# Patient Record
Sex: Male | Born: 1945 | Race: White | Hispanic: No | Marital: Married | Smoking: Former smoker
Health system: Southern US, Community
[De-identification: ages and names within clinical notes are randomized; demographics above are authoritative.]

## PROBLEM LIST (undated history)

## (undated) DIAGNOSIS — F4310 Post-traumatic stress disorder, unspecified: Secondary | ICD-10-CM

## (undated) DIAGNOSIS — M533 Sacrococcygeal disorders, not elsewhere classified: Secondary | ICD-10-CM

## (undated) DIAGNOSIS — M545 Low back pain, unspecified: Secondary | ICD-10-CM

## (undated) DIAGNOSIS — E11319 Type 2 diabetes mellitus with unspecified diabetic retinopathy without macular edema: Secondary | ICD-10-CM

## (undated) DIAGNOSIS — M5431 Sciatica, right side: Secondary | ICD-10-CM

## (undated) DIAGNOSIS — C6290 Malignant neoplasm of unspecified testis, unspecified whether descended or undescended: Secondary | ICD-10-CM

## (undated) DIAGNOSIS — E785 Hyperlipidemia, unspecified: Secondary | ICD-10-CM

## (undated) DIAGNOSIS — D649 Anemia, unspecified: Secondary | ICD-10-CM

## (undated) DIAGNOSIS — I219 Acute myocardial infarction, unspecified: Secondary | ICD-10-CM

## (undated) DIAGNOSIS — H409 Unspecified glaucoma: Secondary | ICD-10-CM

## (undated) DIAGNOSIS — G8929 Other chronic pain: Secondary | ICD-10-CM

## (undated) DIAGNOSIS — G43909 Migraine, unspecified, not intractable, without status migrainosus: Secondary | ICD-10-CM

## (undated) DIAGNOSIS — I2510 Atherosclerotic heart disease of native coronary artery without angina pectoris: Secondary | ICD-10-CM

## (undated) DIAGNOSIS — I4892 Unspecified atrial flutter: Secondary | ICD-10-CM

## (undated) DIAGNOSIS — H269 Unspecified cataract: Secondary | ICD-10-CM

## (undated) DIAGNOSIS — J189 Pneumonia, unspecified organism: Secondary | ICD-10-CM

## (undated) DIAGNOSIS — A6920 Lyme disease, unspecified: Secondary | ICD-10-CM

## (undated) DIAGNOSIS — I050 Rheumatic mitral stenosis: Secondary | ICD-10-CM

## (undated) DIAGNOSIS — I509 Heart failure, unspecified: Secondary | ICD-10-CM

## (undated) DIAGNOSIS — I38 Endocarditis, valve unspecified: Secondary | ICD-10-CM

## (undated) DIAGNOSIS — E119 Type 2 diabetes mellitus without complications: Secondary | ICD-10-CM

## (undated) DIAGNOSIS — M1990 Unspecified osteoarthritis, unspecified site: Secondary | ICD-10-CM

## (undated) HISTORY — DX: Unspecified cataract: H26.9

## (undated) HISTORY — PX: FRACTURE SURGERY: SHX138

## (undated) HISTORY — DX: Type 2 diabetes mellitus with unspecified diabetic retinopathy without macular edema: E11.319

## (undated) HISTORY — DX: Hyperlipidemia, unspecified: E78.5

## (undated) HISTORY — DX: Atherosclerotic heart disease of native coronary artery without angina pectoris: I25.10

## (undated) HISTORY — DX: Rheumatic mitral stenosis: I05.0

## (undated) HISTORY — PX: CARDIAC VALVE REPLACEMENT: SHX585

## (undated) HISTORY — DX: Malignant neoplasm of unspecified testis, unspecified whether descended or undescended: C62.90

## (undated) HISTORY — PX: ELBOW FRACTURE SURGERY: SHX616

## (undated) HISTORY — PX: TONSILLECTOMY: SUR1361

## (undated) HISTORY — PX: ABDOMINAL EXPLORATION SURGERY: SHX538

## (undated) HISTORY — DX: Endocarditis, valve unspecified: I38

## (undated) HISTORY — DX: Unspecified atrial flutter: I48.92

## (undated) HISTORY — PX: EYE SURGERY: SHX253

## (undated) HISTORY — DX: Unspecified glaucoma: H40.9

---
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
## 1980-04-27 DIAGNOSIS — C6290 Malignant neoplasm of unspecified testis, unspecified whether descended or undescended: Secondary | ICD-10-CM

## 1980-04-27 HISTORY — PX: TESTICLE REMOVAL: SHX68

## 1980-04-27 HISTORY — DX: Malignant neoplasm of unspecified testis, unspecified whether descended or undescended: C62.90

## 2006-06-20 ENCOUNTER — Emergency Department (HOSPITAL_COMMUNITY): Admission: EM | Admit: 2006-06-20 | Discharge: 2006-06-20 | Payer: Self-pay | Admitting: Emergency Medicine

## 2006-06-23 ENCOUNTER — Observation Stay (HOSPITAL_COMMUNITY): Admission: RE | Admit: 2006-06-23 | Discharge: 2006-06-23 | Payer: Self-pay | Admitting: Orthopedic Surgery

## 2006-08-12 ENCOUNTER — Emergency Department (HOSPITAL_COMMUNITY): Admission: EM | Admit: 2006-08-12 | Discharge: 2006-08-12 | Payer: Self-pay | Admitting: Emergency Medicine

## 2008-06-12 ENCOUNTER — Ambulatory Visit (HOSPITAL_COMMUNITY): Admission: RE | Admit: 2008-06-12 | Discharge: 2008-06-12 | Payer: Self-pay | Admitting: General Surgery

## 2009-04-27 HISTORY — PX: CARDIAC CATHETERIZATION: SHX172

## 2009-05-28 HISTORY — PX: CORONARY ARTERY BYPASS GRAFT: SHX141

## 2009-06-11 ENCOUNTER — Ambulatory Visit: Payer: Self-pay | Admitting: Cardiovascular Disease

## 2009-06-11 DIAGNOSIS — I209 Angina pectoris, unspecified: Secondary | ICD-10-CM | POA: Insufficient documentation

## 2009-06-14 ENCOUNTER — Ambulatory Visit (HOSPITAL_COMMUNITY): Admission: RE | Admit: 2009-06-14 | Discharge: 2009-06-14 | Payer: Self-pay | Admitting: Cardiology

## 2009-06-18 ENCOUNTER — Ambulatory Visit: Payer: Self-pay | Admitting: Surgery

## 2009-06-18 ENCOUNTER — Encounter: Payer: Self-pay | Admitting: Surgery

## 2009-06-18 ENCOUNTER — Ambulatory Visit (HOSPITAL_COMMUNITY): Admission: RE | Admit: 2009-06-18 | Discharge: 2009-06-18 | Payer: Self-pay | Admitting: Surgery

## 2009-06-20 ENCOUNTER — Inpatient Hospital Stay (HOSPITAL_COMMUNITY): Admission: RE | Admit: 2009-06-20 | Discharge: 2009-06-25 | Payer: Self-pay | Admitting: Surgery

## 2009-06-20 ENCOUNTER — Ambulatory Visit: Payer: Self-pay | Admitting: Surgery

## 2009-07-15 ENCOUNTER — Encounter: Admission: RE | Admit: 2009-07-15 | Discharge: 2009-07-15 | Payer: Self-pay | Admitting: Surgery

## 2009-07-15 ENCOUNTER — Ambulatory Visit: Payer: Self-pay | Admitting: Surgery

## 2010-05-29 NOTE — Assessment & Plan Note (Signed)
Summary: np6   Visit Type:  Initial Consult Primary Provider:  Dr Clovis Riley  CC:  2nd. opinion for abn. echo per Dr. Juanda Chance.  History of Present Illness: 65 year-old male presents for initial evaluation of an abnormal echo and EKG. He was seen by  Dr Amil Amen last week and presents today for a second opinion.  He is a Best boy and has noticed over the last several months that he becomes short of breath with exertion. He also has developed 'indigestion' with racquetball on a few occasions, but more recently has not had this problem. He admits to 'slowing down' his pace with exercise. These symptoms occurred early in his course of exertion and they ease up after he plays longer. He also has become short of breath with walking. He describes a different type of pain in the left chest radiating to the back that occurs with lying down and eases up with sitting upright.  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     Current Medications (verified): 1)  Lisinopril 5 Mg Tabs (Lisinopril) .... Take One Tablet By Mouth Daily 2)  Vytorin 10-20 Mg Tabs (Ezetimibe-Simvastatin) .... Take One Tablet By Mouth Daily At Bedtime 3)  Humalog Mix 75/25 75-25 % Susp (Insulin Lispro Prot & Lispro) .... As Directed 4)  Aspirin Ec 325 Mg Tbec (Aspirin) .... Take One Tablet By Mouth Daily  Allergies (verified): 1)  ! Codeine  Past History:  Past medical, surgical, family and social histories (including risk factors) reviewed, and no changes noted (except as noted below).  Past Medical History: Remote history of testicular CA treated with radiation therapy Left elbow surgery 2009 Hernia surgery 2010 Diabetes - type II, insulin-requiring Hypercholesterolemia  Family History: Reviewed history and no changes required. Mother - 68 Father - 72 - MI  Social History: Reviewed history and no changes  required. Retired Married Remote smoker, quit 1982 Social ETOH  Review of Systems       Negative except as per HPI   Vital Signs:  Patient profile:   65 year old male Height:      66 inches Weight:      193 pounds BMI:     31.26 Pulse rate:   82 / minute Pulse rhythm:   regular Resp:     18 per minute BP sitting:   128 / 72  (left arm) Cuff size:   large  Vitals Entered By: Vikki Ports (June 11, 2009 3:20 PM)  Physical Exam  General:  Pt is well-developed, alert and oriented, no acute distress HEENT: normal Neck: no thyromegaly           JVP normal, carotid upstrokes normal without bruits Lungs: CTA Chest: equal expansion  CV: Apical impulse nondisplaced, RRR without murmur or gallop Abd: soft, NT, positive BS, no HSM, no bruit Back: no CVA tenderness Ext: no clubbing, cyanosis, or edema        femoral pulses 2+ without bruits        pedal pulses 2+ and equal Skin: warm, dry, no rash Neuro: CNII-XII intact,strength 5/5 = b/l    Impression & Recommendations:  Problem # 1:  OTHER AND UNSPECIFIED ANGINA PECTORIS (ICD-413.9) The patient has symptoms that are highly suggestive of angina. He clearly has CAD with an old MI on EKG and corresponding wall motion abnormality on echo. In the setting of diabetes and other risk factors, I agree  that he should undergo cardiac catheterization as his initial test for ischemic heart diseaes. His pretest probability is very high, and stress testing won't add much from a diagnostic standpoint. He is scheduled for diagnostic cath by Dr Ty Hilts later this week. We had a long discussion about possible findings and treatment options (PCI vs CABG vs medical therapy), and he understands that these issues will be clarified after his coronary anatomy is delineated at cath. Further care as directed by Dr Ty Hilts.  His updated medication list for this problem includes:    Lisinopril 5 Mg Tabs (Lisinopril) .Marland Kitchen... Take one tablet by mouth  daily    Aspirin Ec 325 Mg Tbec (Aspirin) .Marland Kitchen... Take one tablet by mouth daily  Patient Instructions: 1)  Your physician recommends that you schedule a follow-up appointment as needed.

## 2010-07-17 LAB — CBC
HCT: 26.6 % — ABNORMAL LOW (ref 39.0–52.0)
HCT: 26.8 % — ABNORMAL LOW (ref 39.0–52.0)
HCT: 27.8 % — ABNORMAL LOW (ref 39.0–52.0)
HCT: 30.4 % — ABNORMAL LOW (ref 39.0–52.0)
HCT: 43.3 % (ref 39.0–52.0)
Hemoglobin: 10.5 g/dL — ABNORMAL LOW (ref 13.0–17.0)
Hemoglobin: 14.6 g/dL (ref 13.0–17.0)
Hemoglobin: 9.2 g/dL — ABNORMAL LOW (ref 13.0–17.0)
Hemoglobin: 9.3 g/dL — ABNORMAL LOW (ref 13.0–17.0)
Hemoglobin: 9.5 g/dL — ABNORMAL LOW (ref 13.0–17.0)
MCHC: 33.7 g/dL (ref 30.0–36.0)
MCHC: 34.3 g/dL (ref 30.0–36.0)
MCHC: 34.4 g/dL (ref 30.0–36.0)
MCHC: 34.6 g/dL (ref 30.0–36.0)
MCHC: 34.8 g/dL (ref 30.0–36.0)
MCV: 89 fL (ref 78.0–100.0)
MCV: 89.8 fL (ref 78.0–100.0)
MCV: 89.8 fL (ref 78.0–100.0)
MCV: 90.2 fL (ref 78.0–100.0)
MCV: 90.5 fL (ref 78.0–100.0)
Platelets: 107 10*3/uL — ABNORMAL LOW (ref 150–400)
Platelets: 125 10*3/uL — ABNORMAL LOW (ref 150–400)
Platelets: 127 10*3/uL — ABNORMAL LOW (ref 150–400)
Platelets: 143 10*3/uL — ABNORMAL LOW (ref 150–400)
Platelets: 246 10*3/uL (ref 150–400)
RBC: 2.98 MIL/uL — ABNORMAL LOW (ref 4.22–5.81)
RBC: 2.99 MIL/uL — ABNORMAL LOW (ref 4.22–5.81)
RBC: 3.07 MIL/uL — ABNORMAL LOW (ref 4.22–5.81)
RBC: 3.39 MIL/uL — ABNORMAL LOW (ref 4.22–5.81)
RBC: 4.8 MIL/uL (ref 4.22–5.81)
RDW: 13.6 % (ref 11.5–15.5)
RDW: 13.9 % (ref 11.5–15.5)
RDW: 14.1 % (ref 11.5–15.5)
RDW: 14.4 % (ref 11.5–15.5)
RDW: 14.4 % (ref 11.5–15.5)
WBC: 5.6 10*3/uL (ref 4.0–10.5)
WBC: 5.7 10*3/uL (ref 4.0–10.5)
WBC: 6.8 10*3/uL (ref 4.0–10.5)
WBC: 7.1 10*3/uL (ref 4.0–10.5)
WBC: 8.1 10*3/uL (ref 4.0–10.5)

## 2010-07-17 LAB — BASIC METABOLIC PANEL
BUN: 12 mg/dL (ref 6–23)
BUN: 13 mg/dL (ref 6–23)
BUN: 17 mg/dL (ref 6–23)
BUN: 8 mg/dL (ref 6–23)
CO2: 26 mEq/L (ref 19–32)
CO2: 29 mEq/L (ref 19–32)
CO2: 32 mEq/L (ref 19–32)
CO2: 33 mEq/L — ABNORMAL HIGH (ref 19–32)
Calcium: 7.7 mg/dL — ABNORMAL LOW (ref 8.4–10.5)
Calcium: 7.7 mg/dL — ABNORMAL LOW (ref 8.4–10.5)
Calcium: 8.1 mg/dL — ABNORMAL LOW (ref 8.4–10.5)
Calcium: 8.6 mg/dL (ref 8.4–10.5)
Chloride: 101 mEq/L (ref 96–112)
Chloride: 106 mEq/L (ref 96–112)
Chloride: 95 mEq/L — ABNORMAL LOW (ref 96–112)
Chloride: 98 mEq/L (ref 96–112)
Creatinine, Ser: 0.81 mg/dL (ref 0.4–1.5)
Creatinine, Ser: 0.85 mg/dL (ref 0.4–1.5)
Creatinine, Ser: 0.9 mg/dL (ref 0.4–1.5)
Creatinine, Ser: 1.03 mg/dL (ref 0.4–1.5)
GFR calc Af Amer: >60 mL/min (ref >60)
GFR calc Af Amer: >60 mL/min (ref >60)
GFR calc Af Amer: >60 mL/min (ref >60)
GFR calc Af Amer: >60 mL/min (ref >60)
GFR calc non Af Amer: >60 mL/min (ref >60)
GFR calc non Af Amer: >60 mL/min (ref >60)
GFR calc non Af Amer: >60 mL/min (ref >60)
GFR calc non Af Amer: >60 mL/min (ref >60)
Glucose, Bld: 122 mg/dL — ABNORMAL HIGH (ref 70–99)
Glucose, Bld: 139 mg/dL — ABNORMAL HIGH (ref 70–99)
Glucose, Bld: 166 mg/dL — ABNORMAL HIGH (ref 70–99)
Glucose, Bld: 228 mg/dL — ABNORMAL HIGH (ref 70–99)
Potassium: 3.8 mEq/L (ref 3.5–5.1)
Potassium: 4 mEq/L (ref 3.5–5.1)
Potassium: 4.6 mEq/L (ref 3.5–5.1)
Potassium: 4.7 mEq/L (ref 3.5–5.1)
Sodium: 131 mEq/L — ABNORMAL LOW (ref 135–145)
Sodium: 137 mEq/L (ref 135–145)
Sodium: 137 mEq/L (ref 135–145)
Sodium: 137 mEq/L (ref 135–145)

## 2010-07-17 LAB — COMPREHENSIVE METABOLIC PANEL
ALT: 32 U/L (ref 0–53)
AST: 33 U/L (ref 0–37)
Albumin: 4 g/dL (ref 3.5–5.2)
Alkaline Phosphatase: 86 U/L (ref 39–117)
BUN: 11 mg/dL (ref 6–23)
CO2: 29 mEq/L (ref 19–32)
Calcium: 9.3 mg/dL (ref 8.4–10.5)
Chloride: 100 mEq/L (ref 96–112)
Creatinine, Ser: 0.8 mg/dL (ref 0.4–1.5)
GFR calc Af Amer: >60 mL/min (ref >60)
GFR calc non Af Amer: >60 mL/min (ref >60)
Glucose, Bld: 82 mg/dL (ref 70–99)
Potassium: 4 mEq/L (ref 3.5–5.1)
Sodium: 136 mEq/L (ref 135–145)
Total Bilirubin: 0.9 mg/dL (ref 0.3–1.2)
Total Protein: 6.7 g/dL (ref 6.0–8.3)

## 2010-07-17 LAB — GLUCOSE, CAPILLARY
Glucose-Capillary: 109 mg/dL — ABNORMAL HIGH (ref 70–99)
Glucose-Capillary: 123 mg/dL — ABNORMAL HIGH (ref 70–99)
Glucose-Capillary: 124 mg/dL — ABNORMAL HIGH (ref 70–99)
Glucose-Capillary: 136 mg/dL — ABNORMAL HIGH (ref 70–99)
Glucose-Capillary: 144 mg/dL — ABNORMAL HIGH (ref 70–99)
Glucose-Capillary: 152 mg/dL — ABNORMAL HIGH (ref 70–99)
Glucose-Capillary: 153 mg/dL — ABNORMAL HIGH (ref 70–99)
Glucose-Capillary: 160 mg/dL — ABNORMAL HIGH (ref 70–99)
Glucose-Capillary: 165 mg/dL — ABNORMAL HIGH (ref 70–99)
Glucose-Capillary: 168 mg/dL — ABNORMAL HIGH (ref 70–99)
Glucose-Capillary: 172 mg/dL — ABNORMAL HIGH (ref 70–99)
Glucose-Capillary: 172 mg/dL — ABNORMAL HIGH (ref 70–99)
Glucose-Capillary: 184 mg/dL — ABNORMAL HIGH (ref 70–99)
Glucose-Capillary: 205 mg/dL — ABNORMAL HIGH (ref 70–99)
Glucose-Capillary: 212 mg/dL — ABNORMAL HIGH (ref 70–99)
Glucose-Capillary: 219 mg/dL — ABNORMAL HIGH (ref 70–99)
Glucose-Capillary: 224 mg/dL — ABNORMAL HIGH (ref 70–99)
Glucose-Capillary: 226 mg/dL — ABNORMAL HIGH (ref 70–99)
Glucose-Capillary: 228 mg/dL — ABNORMAL HIGH (ref 70–99)
Glucose-Capillary: 232 mg/dL — ABNORMAL HIGH (ref 70–99)
Glucose-Capillary: 243 mg/dL — ABNORMAL HIGH (ref 70–99)
Glucose-Capillary: 252 mg/dL — ABNORMAL HIGH (ref 70–99)
Glucose-Capillary: 280 mg/dL — ABNORMAL HIGH (ref 70–99)
Glucose-Capillary: 285 mg/dL — ABNORMAL HIGH (ref 70–99)
Glucose-Capillary: 62 mg/dL — ABNORMAL LOW (ref 70–99)
Glucose-Capillary: 83 mg/dL (ref 70–99)
Glucose-Capillary: 85 mg/dL (ref 70–99)
Glucose-Capillary: 96 mg/dL (ref 70–99)

## 2010-07-17 LAB — BLOOD GAS, ARTERIAL
Acid-Base Excess: 2.1 mmol/L — ABNORMAL HIGH (ref 0.0–2.0)
Bicarbonate: 26.4 mEq/L — ABNORMAL HIGH (ref 20.0–24.0)
Drawn by: 181601
FIO2: 0.21 %
O2 Saturation: 95.2 %
Patient temperature: 98.6
TCO2: 27.8 mmol/L (ref 0–100)
pCO2 arterial: 43.6 mmHg (ref 35.0–45.0)
pH, Arterial: 7.4 (ref 7.350–7.450)
pO2, Arterial: 77.4 mmHg — ABNORMAL LOW (ref 80.0–100.0)

## 2010-07-17 LAB — POCT I-STAT 3, ART BLOOD GAS (G3+)
Acid-Base Excess: 3 mmol/L — ABNORMAL HIGH (ref 0.0–2.0)
Bicarbonate: 24.6 mEq/L — ABNORMAL HIGH (ref 20.0–24.0)
Bicarbonate: 25.5 mEq/L — ABNORMAL HIGH (ref 20.0–24.0)
Bicarbonate: 26.5 mEq/L — ABNORMAL HIGH (ref 20.0–24.0)
O2 Saturation: 100 %
O2 Saturation: 95 %
O2 Saturation: 99 %
Patient temperature: 35.2
Patient temperature: 37
TCO2: 26 mmol/L (ref 0–100)
TCO2: 27 mmol/L (ref 0–100)
TCO2: 28 mmol/L (ref 0–100)
pCO2 arterial: 33.9 mmHg — ABNORMAL LOW (ref 35.0–45.0)
pCO2 arterial: 36.5 mmHg (ref 35.0–45.0)
pCO2 arterial: 44.2 mmHg (ref 35.0–45.0)
pH, Arterial: 7.369 (ref 7.350–7.450)
pH, Arterial: 7.461 — ABNORMAL HIGH (ref 7.350–7.450)
pH, Arterial: 7.469 — ABNORMAL HIGH (ref 7.350–7.450)
pO2, Arterial: 124 mmHg — ABNORMAL HIGH (ref 80.0–100.0)
pO2, Arterial: 260 mmHg — ABNORMAL HIGH (ref 80.0–100.0)
pO2, Arterial: 65 mmHg — ABNORMAL LOW (ref 80.0–100.0)

## 2010-07-17 LAB — CROSSMATCH
ABO/RH(D): A POS
Antibody Screen: NEGATIVE

## 2010-07-17 LAB — POCT I-STAT 4, (NA,K, GLUC, HGB,HCT)
Glucose, Bld: 106 mg/dL — ABNORMAL HIGH (ref 70–99)
Glucose, Bld: 132 mg/dL — ABNORMAL HIGH (ref 70–99)
Glucose, Bld: 141 mg/dL — ABNORMAL HIGH (ref 70–99)
Glucose, Bld: 153 mg/dL — ABNORMAL HIGH (ref 70–99)
Glucose, Bld: 159 mg/dL — ABNORMAL HIGH (ref 70–99)
Glucose, Bld: 169 mg/dL — ABNORMAL HIGH (ref 70–99)
HCT: 24 % — ABNORMAL LOW (ref 39.0–52.0)
HCT: 26 % — ABNORMAL LOW (ref 39.0–52.0)
HCT: 26 % — ABNORMAL LOW (ref 39.0–52.0)
HCT: 26 % — ABNORMAL LOW (ref 39.0–52.0)
HCT: 31 % — ABNORMAL LOW (ref 39.0–52.0)
HCT: 33 % — ABNORMAL LOW (ref 39.0–52.0)
Hemoglobin: 10.5 g/dL — ABNORMAL LOW (ref 13.0–17.0)
Hemoglobin: 11.2 g/dL — ABNORMAL LOW (ref 13.0–17.0)
Hemoglobin: 8.2 g/dL — ABNORMAL LOW (ref 13.0–17.0)
Hemoglobin: 8.8 g/dL — ABNORMAL LOW (ref 13.0–17.0)
Hemoglobin: 8.8 g/dL — ABNORMAL LOW (ref 13.0–17.0)
Hemoglobin: 8.8 g/dL — ABNORMAL LOW (ref 13.0–17.0)
Potassium: 3.7 mEq/L (ref 3.5–5.1)
Potassium: 4.2 mEq/L (ref 3.5–5.1)
Potassium: 4.4 mEq/L (ref 3.5–5.1)
Potassium: 4.5 mEq/L (ref 3.5–5.1)
Potassium: 4.5 mEq/L (ref 3.5–5.1)
Potassium: 5.7 mEq/L — ABNORMAL HIGH (ref 3.5–5.1)
Sodium: 134 mEq/L — ABNORMAL LOW (ref 135–145)
Sodium: 134 mEq/L — ABNORMAL LOW (ref 135–145)
Sodium: 135 mEq/L (ref 135–145)
Sodium: 136 mEq/L (ref 135–145)
Sodium: 138 mEq/L (ref 135–145)
Sodium: 139 mEq/L (ref 135–145)

## 2010-07-17 LAB — URINALYSIS, ROUTINE W REFLEX MICROSCOPIC
Bilirubin Urine: NEGATIVE
Glucose, UA: 100 mg/dL — AB
Hgb urine dipstick: NEGATIVE
Ketones, ur: NEGATIVE mg/dL
Nitrite: NEGATIVE
Protein, ur: NEGATIVE mg/dL
Specific Gravity, Urine: 1.017 (ref 1.005–1.030)
Urobilinogen, UA: 0.2 mg/dL (ref 0.0–1.0)
pH: 7.5 (ref 5.0–8.0)

## 2010-07-17 LAB — HEMOGLOBIN A1C
Hgb A1c MFr Bld: 11.5 % — ABNORMAL HIGH (ref 4.6–6.1)
Mean Plasma Glucose: 283 mg/dL

## 2010-07-17 LAB — PLATELET COUNT: Platelets: 138 10*3/uL — ABNORMAL LOW (ref 150–400)

## 2010-07-17 LAB — POCT I-STAT, CHEM 8
BUN: 15 mg/dL (ref 6–23)
Calcium, Ion: 1.18 mmol/L (ref 1.12–1.32)
Chloride: 108 mEq/L (ref 96–112)
Creatinine, Ser: 1 mg/dL (ref 0.4–1.5)
Glucose, Bld: 177 mg/dL — ABNORMAL HIGH (ref 70–99)
HCT: 28 % — ABNORMAL LOW (ref 39.0–52.0)
Hemoglobin: 9.5 g/dL — ABNORMAL LOW (ref 13.0–17.0)
Potassium: 5.4 mEq/L — ABNORMAL HIGH (ref 3.5–5.1)
Sodium: 139 mEq/L (ref 135–145)
TCO2: 26 mmol/L (ref 0–100)

## 2010-07-17 LAB — PROTIME-INR
INR: 0.98 (ref 0.00–1.49)
INR: 1.31 (ref 0.00–1.49)
Prothrombin Time: 12.9 seconds (ref 11.6–15.2)
Prothrombin Time: 16.2 seconds — ABNORMAL HIGH (ref 11.6–15.2)

## 2010-07-17 LAB — APTT
aPTT: 27 seconds (ref 24–37)
aPTT: 34 seconds (ref 24–37)

## 2010-07-17 LAB — HEMOGLOBIN AND HEMATOCRIT, BLOOD
HCT: 26.1 % — ABNORMAL LOW (ref 39.0–52.0)
Hemoglobin: 9 g/dL — ABNORMAL LOW (ref 13.0–17.0)

## 2010-07-17 LAB — CREATININE, SERUM
Creatinine, Ser: 0.79 mg/dL (ref 0.4–1.5)
GFR calc Af Amer: >60 mL/min (ref >60)
GFR calc non Af Amer: >60 mL/min (ref >60)

## 2010-07-17 LAB — SURGICAL PCR SCREEN
MRSA, PCR: NEGATIVE
Staphylococcus aureus: POSITIVE — AB

## 2010-07-17 LAB — MRSA PCR SCREENING: MRSA by PCR: NEGATIVE

## 2010-07-17 LAB — ABO/RH: ABO/RH(D): A POS

## 2010-07-17 LAB — MAGNESIUM
Magnesium: 1.8 mg/dL (ref 1.5–2.5)
Magnesium: 2.2 mg/dL (ref 1.5–2.5)
Magnesium: 2.5 mg/dL (ref 1.5–2.5)

## 2010-07-21 LAB — GLUCOSE, CAPILLARY: Glucose-Capillary: 242 mg/dL — ABNORMAL HIGH (ref 70–99)

## 2010-08-12 LAB — CBC
HCT: 42.3 % (ref 39.0–52.0)
Hemoglobin: 14.8 g/dL (ref 13.0–17.0)
MCHC: 35 g/dL (ref 30.0–36.0)
MCV: 88 fL (ref 78.0–100.0)
Platelets: 248 10*3/uL (ref 150–400)
RBC: 4.8 MIL/uL (ref 4.22–5.81)
RDW: 13.8 % (ref 11.5–15.5)
WBC: 5.5 10*3/uL (ref 4.0–10.5)

## 2010-08-12 LAB — COMPREHENSIVE METABOLIC PANEL
ALT: 26 U/L (ref 0–53)
AST: 26 U/L (ref 0–37)
Albumin: 3.7 g/dL (ref 3.5–5.2)
Alkaline Phosphatase: 80 U/L (ref 39–117)
BUN: 14 mg/dL (ref 6–23)
CO2: 27 mEq/L (ref 19–32)
Calcium: 9.6 mg/dL (ref 8.4–10.5)
Chloride: 102 mEq/L (ref 96–112)
Creatinine, Ser: 0.75 mg/dL (ref 0.4–1.5)
GFR calc Af Amer: >60 mL/min (ref >60)
GFR calc non Af Amer: >60 mL/min (ref >60)
Glucose, Bld: 116 mg/dL — ABNORMAL HIGH (ref 70–99)
Potassium: 4.5 mEq/L (ref 3.5–5.1)
Sodium: 137 mEq/L (ref 135–145)
Total Bilirubin: 0.8 mg/dL (ref 0.3–1.2)
Total Protein: 6.4 g/dL (ref 6.0–8.3)

## 2010-08-12 LAB — GLUCOSE, CAPILLARY
Glucose-Capillary: 142 mg/dL — ABNORMAL HIGH (ref 70–99)
Glucose-Capillary: 89 mg/dL (ref 70–99)

## 2010-08-12 LAB — DIFFERENTIAL
Basophils Absolute: 0 10*3/uL (ref 0.0–0.1)
Basophils Relative: 0 % (ref 0–1)
Eosinophils Absolute: 0.3 10*3/uL (ref 0.0–0.7)
Eosinophils Relative: 5 % (ref 0–5)
Lymphocytes Relative: 30 % (ref 12–46)
Lymphs Abs: 1.6 10*3/uL (ref 0.7–4.0)
Monocytes Absolute: 0.5 10*3/uL (ref 0.1–1.0)
Monocytes Relative: 9 % (ref 3–12)
Neutro Abs: 3.1 10*3/uL (ref 1.7–7.7)
Neutrophils Relative %: 57 % (ref 43–77)

## 2010-09-09 NOTE — Assessment & Plan Note (Signed)
OFFICE VISIT   HY, SWIATEK  DOB:  1946-01-02                                        July 15, 2009  CHART #:  [id number]   HISTORY:  The patient is a 65 year old diabetic male who is now status  post coronary artery bypass grafting x3 done on June 20, 2009,  specifically coronary artery bypass grafting x3 with a left internal  mammary artery graft to the LAD, a saphenous vein graft to the obtuse  marginal branch of the left circumflex coronary artery, and a saphenous  vein graft to the posterior descending branch of the right coronary  artery.  This was done for severe three-vessel coronary artery disease  with exertional anginal symptoms.  The patient presents today in routine  post surgery follow up.  Overall, he feels as though he is making  excellent progress.  He is having minimal discomfort in his chest  incision and sternotomy.  He did have one episode with significant cough  that caused increased soreness for a couple days.  He has some mild  right lower extremity discomfort at the [facility] site.  He denies shortness  of breath or recurrent anginal symptoms.  He has no fevers, chills, or  other constitutional symptoms.  He is scheduled to start cardiac rehab  phase II soon.   DIAGNOSTIC TESTS:  A chest x-ray was obtained on today's date and it  reveals clear lung fields but no significant effusions or infiltrates.  There is no evidence of congestive failure.   PHYSICAL EXAMINATION:  Vital Signs:  Blood pressure is 136/76, pulse is  62, respirations 18, oxygen saturation is 97% on room air.  General  Appearance:  Well-developed adult male in no acute distress.  Pulmonary:  Clear breath sounds throughout.  Cardiac:  Regular rate and rhythm.  Normal S1 and S2.  No murmurs.  No gallops.  No rubs.  Extremities:  Minimal to trace edema in the right lower extremity, otherwise  unremarkable.  Incisions:  Incisions are inspected and are healing well.  There is no evidence of infection.  There is no sternal click.   ASSESSMENT:  The patient is making excellent recovery following his  surgical revascularization for severe three-vessel coronary artery  disease.  I have encouraged him to continue his progress through the  cardiac rehabilitation phase II program.  He is encouraged to follow up  with his primary physician for diabetic management as well as with his  cardiologist for long-term cardiology management.  He has an appointment  scheduled to see Dr. Ty Hilts but is unsure when this is.  I have told  him he can begin to drive.  We will see him again on a p.r.n. basis for  any surgically related issues or at request.   Rowe Clack, P.A.-C.   Sherryll Burger  D:  07/15/2009  T:  07/16/2009  Job:  [id number]   cc:   Francisca December, M.D.

## 2010-09-09 NOTE — Op Note (Signed)
Matthew [name], Matthew [name]              ACCOUNT NO.:  [id number]   MEDICAL RECORD NO.:  [id number]          PATIENT TYPE:  AMB   LOCATION:  SDS                          FACILITY:  [facility]   PHYSICIAN:  Cherylynn Ridges, M.D.    DATE OF BIRTH:  [birth year]   DATE OF PROCEDURE:  DATE OF DISCHARGE:                               OPERATIVE REPORT   PREOPERATIVE DIAGNOSIS:  Ventral hernia in the epigastrium.   POSTOPERATIVE DIAGNOSIS:  Diastasis recti.   PROCEDURE:  Diagnostic laparoscopy.   SURGEON:  Cherylynn Ridges, MD.   ASSISTANT:  Only in consultation, but did not scrub was Dr. Zachery Dakins.   ANESTHESIA:  General endotracheal.   ESTIMATED BLOOD LOSS:  Less than 5 mL.   COMPLICATIONS:  None.   CONDITION:  Stable.   FINDINGS:  The patient had a diastasis recti up near the falciform  ligament in the upper portion of the abdomen.  No evidence of  significant abdominal hernia.   INDICATION FOR OPERATION:  The patient is a 65 year old diabetic who had  seen me on two different occasions for a possible ventral hernia, but it  was thought to be a one-time diastasis recti.  However, because it was  thought to be a de novo hernia, we thought we would come in for a  diagnostic laparoscopy and possible repair.   FINDINGS:  The patient had normal abdominal fascia.  No evidence of a  hernia.  No hernia sac.  The diastasis recti extended up to the  falciform ligament.  There was no evidence of hernia.   OPERATION:  The patient was taken to the operating room, and placed on  the table in the supine position.  After an adequate general  endotracheal anesthetic was administered, he was prepped and draped in  the usual sterile manner exposing the entire abdomen.   We made a left lower quadrant transverse incision about 2 cm above the  iliac crest, and 2 cm near to the iliac crest and anterior superior  iliac spine.  We then used an Optiview cannula with an inserted  laparoscope and camera and light  source, and then went down into the  peritoneal cavity sequentially through the layers.  Once we had  penetrated the peritoneal cavity, we insufflated with carbon dioxide gas  up to a maximum intra-abdominal pressure of 15 mmHg.   With this cannula in place, we were able to inspect the entire abdomen.  The liver, gallbladder, and stomach, all appeared to be normal.  Upon  viewing the anterior abdominal wall, and palpating in the area of the  patient's noted hernia, which is in the epigastrium, there was only a  weakness in the fascia, but no actual hernia.  Most of the weakness  appeared to be at the inferior border of the falciform ligament.  However, there was no fascial defect.  The umbilicus appeared normal.   Pictures were taken of the abdominal wall and of the gallbladder, but no  hernia was noted.  Because of that, no repair was performed.  We removed  the cannula from the left lower  quadrant, aspirating gas as we came out.  We then closed the anterior fascia using #1 Vicryl.  Marcaine 0.25% with  epi was injected into the skin, and then we closed using a running  subcuticular Monocryl.  Dermabond, Steri-strips, and Tegaderm were used  to complete the dressing.  All counts were correct.      Cherylynn Ridges, M.D.  Electronically Signed     JOW/MEDQ  D:  06/12/2008  T:  06/12/2008  Job:  [id number]   cc:   Gretta Arab. Valentina Lucks, M.D.

## 2010-09-12 NOTE — Op Note (Signed)
Matthew [name], Matthew [name]              ACCOUNT NO.:  [id number]   MEDICAL RECORD NO.:  [id number]          PATIENT TYPE:  AMB   LOCATION:  DAY                          FACILITY:  [facility]   PHYSICIAN:  Marlowe Kays, M.D.  DATE OF BIRTH:  24-Feb-1946   DATE OF PROCEDURE:  06/22/2006  DATE OF DISCHARGE:                               OPERATIVE REPORT   PREOPERATIVE DIAGNOSIS:  Closed comminuted displaced olecranon fracture,  left elbow.   POSTOPERATIVE DIAGNOSIS:  Closed comminuted displaced olecranon  fracture, left elbow.   OPERATION:  Open reduction and internal fixation left olecranon fracture  using Accumed plate and screws.   SURGEON:  Marlowe Kays, M.D.   ASSISTANT:  Madelynn Done, M.D.   ANESTHESIA:  General.   JUSTIFICATION FOR PROCEDURE:  The injury occurred two days ago.  This  allowed time for preoperative planning and plate acquisition to conform  to the elbow.   PROCEDURE:  Satisfactory general anesthesia, prophylactic antibiotics,  pneumatic tourniquet, the left upper extremity was Esmarch'd out non-  sterilely and the left arm prepped with DuraPrep from tourniquet to hand  and draped in a sterile field.  I made a posterior somewhat radial  oriented incision around the olecranon.  The incision was carried down  through the subcutaneous tissue.  A fracture hematoma came forth.  We  stayed well away from the ulnar nerve medially and consequently did not  try and dissect it out since the fracture did lend itself to fairly easy  dissection in the middle plane.  The fracture hematoma was completely  evacuated and the comminuted fracture we were able to piece together for  a tentative reduction.  I placed a Steinmann pin down through the  olecranon into the ulna to confirm this with a C-arm lateral projection.  We then picked out the appropriate Accumed plate and were able to thread  the curved end of the plate around the olecranon over the Steinmann pin  and  stabilize the plate manually while we placed several fixation screws  in the distal subcutaneous border of the ulnar bone.  We then took a  confirmatory lateral x-ray once again and completed and took  confirmatory x-rays as we went along the way finally placing two screws  in the head of the Accumed plate in the olecranon and a vertical  compression screw, which I estimated to be about 50 mm in length, and  this indeed, did work out well compressing down the fracture site.  Two  lateral fragments of bone we were then able to incorporate using #2  FiberWire through one of the holes in the plate that we left open for  just this purpose.  Final AP and lateral x-rays were taken showing good  reconstitution of the joint and good position of the plate and screws.  We then irrigated the wound well with sterile saline.  We closed soft  tissue over the plate with interrupted #1 Vicryl, subcutaneous tissue  with over 2-0 Vicryl, the skin with  staples.  Betadine and Adaptic dry, sterile dressing were applied.  The  tourniquet was  released with 1 hour 48 minutes of tourniquet time.  A  long arm splint cast was applied.  The arm was placed in a sling.  He  tolerated the procedure well and was taken to the recovery room in  satisfactory condition with no complications.           ______________________________  Marlowe Kays, M.D.     JA/MEDQ  D:  06/22/2006  T:  06/22/2006  Job:  [id number]

## 2013-01-21 ENCOUNTER — Other Ambulatory Visit: Payer: Self-pay | Admitting: Interventional Cardiology

## 2013-01-21 DIAGNOSIS — Z79899 Other long term (current) drug therapy: Secondary | ICD-10-CM

## 2013-01-21 DIAGNOSIS — E785 Hyperlipidemia, unspecified: Secondary | ICD-10-CM

## 2013-01-28 ENCOUNTER — Emergency Department (HOSPITAL_COMMUNITY)
Admission: EM | Admit: 2013-01-28 | Discharge: 2013-01-28 | Disposition: A | Discharge status: Home / Self Care | Payer: Managed Care, Other (non HMO) | Attending: Emergency Medicine | Admitting: Emergency Medicine

## 2013-01-28 ENCOUNTER — Encounter (HOSPITAL_COMMUNITY): Payer: Self-pay | Admitting: Emergency Medicine

## 2013-01-28 ENCOUNTER — Emergency Department (HOSPITAL_COMMUNITY): Payer: Managed Care, Other (non HMO)

## 2013-01-28 DIAGNOSIS — Y9389 Activity, other specified: Secondary | ICD-10-CM | POA: Insufficient documentation

## 2013-01-28 DIAGNOSIS — Z859 Personal history of malignant neoplasm, unspecified: Secondary | ICD-10-CM | POA: Insufficient documentation

## 2013-01-28 DIAGNOSIS — S060X0A Concussion without loss of consciousness, initial encounter: Secondary | ICD-10-CM | POA: Insufficient documentation

## 2013-01-28 DIAGNOSIS — Z794 Long term (current) use of insulin: Secondary | ICD-10-CM | POA: Insufficient documentation

## 2013-01-28 DIAGNOSIS — Z7982 Long term (current) use of aspirin: Secondary | ICD-10-CM | POA: Insufficient documentation

## 2013-01-28 DIAGNOSIS — Z79899 Other long term (current) drug therapy: Secondary | ICD-10-CM | POA: Insufficient documentation

## 2013-01-28 DIAGNOSIS — R42 Dizziness and giddiness: Secondary | ICD-10-CM | POA: Insufficient documentation

## 2013-01-28 DIAGNOSIS — Y92410 Unspecified street and highway as the place of occurrence of the external cause: Secondary | ICD-10-CM | POA: Insufficient documentation

## 2013-01-28 DIAGNOSIS — E119 Type 2 diabetes mellitus without complications: Secondary | ICD-10-CM | POA: Insufficient documentation
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
## 2013-02-10 ENCOUNTER — Other Ambulatory Visit (INDEPENDENT_AMBULATORY_CARE_PROVIDER_SITE_OTHER): Payer: Managed Care, Other (non HMO)

## 2013-02-10 DIAGNOSIS — Z79899 Other long term (current) drug therapy: Secondary | ICD-10-CM

## 2013-02-10 DIAGNOSIS — E785 Hyperlipidemia, unspecified: Secondary | ICD-10-CM

## 2013-02-10 LAB — ALT: ALT: 16 U/L (ref 0–53)

## 2013-02-13 LAB — NMR LIPOPROFILE WITH LIPIDS
Cholesterol, Total: 147 mg/dL (ref <200)
HDL Particle Number: 35.6 umol/L (ref >=30.5)
HDL Size: 8.7 nm — ABNORMAL LOW (ref >=9.2)
HDL-C: 48 mg/dL (ref >=40)
LDL (calc): 85 mg/dL (ref <100)
LDL Particle Number: 1314 nmol/L — ABNORMAL HIGH (ref <1000)
LDL Size: 21 nm (ref >20.5)
LP-IR Score: 47 — ABNORMAL HIGH (ref <=45)
Large HDL-P: 4.2 umol/L — ABNORMAL LOW (ref >=4.8)
Large VLDL-P: 1.3 nmol/L (ref <=2.7)
Small LDL Particle Number: 643 nmol/L — ABNORMAL HIGH (ref <=527)
Triglycerides: 70 mg/dL (ref <150)
VLDL Size: 45.6 nm (ref <=46.6)

## 2013-02-14 ENCOUNTER — Telehealth: Payer: Self-pay | Admitting: Pharmacist

## 2013-02-14 DIAGNOSIS — E785 Hyperlipidemia, unspecified: Secondary | ICD-10-CM

## 2013-02-14 DIAGNOSIS — Z79899 Other long term (current) drug therapy: Secondary | ICD-10-CM

## 2013-02-14 MED ORDER — COENZYME Q10 30 MG PO CAPS: 100.0000 mg | ORAL_CAPSULE | Freq: Two times a day (BID) | ORAL | Status: DC

## 2013-02-14 NOTE — Telephone Encounter (Signed)
RF: CAD, Diabetes Type I, HTN, age - LDL goal < 70, non-HDL goal < 100, LDL-P goal < 1000. Meds: Atorvastatin 10 mg qd for past month. Intolerant: Vytorin 10/20 mg (myaglias). Patient with h/o CAD who had LDL-P of 1150 on vytorin 10/20 mg qd in past, LDL-P of 1900 off statin, and now LDL-P ~ 1300 on atorvastatin 10 mg for a month. Patient also on Co-Q 10 and Vitamin D. Patient and I had a long discussion 10/2012 about rechallenging statin, and she was agreeable to low dose lipitor 10 mg qd. Cholesterol much improved, but LDL-P still above goal of < 1000.  Since only on for 3 weeks, and h/o failing other statins, will give 4 more months, and recheck NMR and LFTs at that time.  Could consider atorvastatin 20 mg qd at that time if needed.    Patient notified.  Labs set up.

## 2013-03-16 ENCOUNTER — Encounter: Payer: Self-pay | Admitting: Interventional Cardiology

## 2013-06-21 ENCOUNTER — Other Ambulatory Visit (INDEPENDENT_AMBULATORY_CARE_PROVIDER_SITE_OTHER): Payer: Medicare HMO

## 2013-06-21 DIAGNOSIS — E785 Hyperlipidemia, unspecified: Secondary | ICD-10-CM

## 2013-06-21 DIAGNOSIS — Z79899 Other long term (current) drug therapy: Secondary | ICD-10-CM

## 2013-06-21 LAB — HEPATIC FUNCTION PANEL
ALT: 17 U/L (ref 0–53)
AST: 21 U/L (ref 0–37)
Albumin: 3.6 g/dL (ref 3.5–5.2)
Alkaline Phosphatase: 67 U/L (ref 39–117)
Bilirubin, Direct: 0 mg/dL (ref 0.0–0.3)
Total Bilirubin: 0.6 mg/dL (ref 0.3–1.2)
Total Protein: 6.9 g/dL (ref 6.0–8.3)

## 2013-06-22 ENCOUNTER — Other Ambulatory Visit: Payer: Managed Care, Other (non HMO)

## 2013-06-23 LAB — NMR LIPOPROFILE WITH LIPIDS
Cholesterol, Total: 216 mg/dL — ABNORMAL HIGH (ref <200)
HDL Particle Number: 33.1 umol/L (ref >=30.5)
HDL Size: 9.1 nm — ABNORMAL LOW (ref >=9.2)
HDL-C: 54 mg/dL (ref >=40)
LDL (calc): 136 mg/dL — ABNORMAL HIGH (ref <100)
LDL Particle Number: 1826 nmol/L — ABNORMAL HIGH (ref <1000)
LDL Size: 21 nm (ref >20.5)
LP-IR Score: 64 — ABNORMAL HIGH (ref <=45)
Large HDL-P: 6.2 umol/L (ref >=4.8)
Large VLDL-P: 4.1 nmol/L — ABNORMAL HIGH (ref <=2.7)
Small LDL Particle Number: 565 nmol/L — ABNORMAL HIGH (ref <=527)
Triglycerides: 132 mg/dL (ref <150)
VLDL Size: 56.3 nm — ABNORMAL HIGH (ref <=46.6)

## 2013-06-29 ENCOUNTER — Ambulatory Visit: Payer: Self-pay | Admitting: Interventional Cardiology

## 2013-06-30 ENCOUNTER — Encounter: Payer: Self-pay | Admitting: Cardiology

## 2013-06-30 ENCOUNTER — Encounter: Payer: Self-pay | Admitting: Interventional Cardiology

## 2013-06-30 ENCOUNTER — Ambulatory Visit (INDEPENDENT_AMBULATORY_CARE_PROVIDER_SITE_OTHER): Payer: Medicare HMO | Admitting: Interventional Cardiology

## 2013-06-30 VITALS — BP 144/78 | HR 93 | Ht 66.0 in | Wt 195.8 lb

## 2013-06-30 DIAGNOSIS — I252 Old myocardial infarction: Secondary | ICD-10-CM

## 2013-06-30 DIAGNOSIS — E7800 Pure hypercholesterolemia, unspecified: Secondary | ICD-10-CM

## 2013-06-30 DIAGNOSIS — I2510 Atherosclerotic heart disease of native coronary artery without angina pectoris: Secondary | ICD-10-CM | POA: Insufficient documentation

## 2013-06-30 NOTE — Progress Notes (Signed)
Patient ID: Matthew [name], male   DOB: 1945-11-24, 68 y.o.   MRN: [id number]    [place], [place],   [address] Phone: [phone] Fax:  [phone]  Date:  06/30/2013   ID:  Matthew [name], DOB February 06, 1946, MRN [id number]  PCP:  Default, Provider, MD      History of Present Illness: Matthew [name] is a 68 y.o. male who has had CABG. He remains very active. He walked regularly and played racquetball with no chest pain, until his back injury. He is getting back to exercise. He has had rare chest discomfort that lasts 1-2 minutes. He has not used NTG. CAD/ASCVD:  Denies : Diaphoresis.  Diet.  Dizziness.  Dyspnea on exertion.  Fatigue.  Leg edema.  Nitroglycerin.  Orthopnea.  Palpitations.  Syncope.     Wt Readings from Last 3 Encounters:  06/30/13 195 lb 12.8 oz (88.814 kg)  06/11/09 193 lb (87.544 kg)     Past Medical History  Diagnosis Date  . Diabetes mellitus without complication   . Cancer     testicular  . Hyperlipidemia   . Hypertension   . Glaucoma   . Diabetic retinopathy     mild- Dr. Herbert Deaner  . Coronary artery disease   . MI, old     mild LV dysfunction, EF 45-50%, inferior posterior akinesis    Current Outpatient Prescriptions  Medication Sig Dispense Refill  . aspirin 325 MG tablet Take 325 mg by mouth daily.      . BD INSULIN SYRINGE ULTRAFINE 31G X 5/16" 0.3 ML MISC       . co-enzyme Q-10 30 MG capsule Take 3 capsules (90 mg total) by mouth 2 (two) times daily.      Marland Kitchen lisinopril (PRINIVIL,ZESTRIL) 5 MG tablet Take 5 mg by mouth daily.      . metoprolol tartrate (LOPRESSOR) 25 MG tablet Take 25 mg by mouth 2 (two) times daily.      Marland Kitchen NOVOLOG MIX 70/30 (70-30) 100 UNIT/ML injection       . VITAMIN D, ERGOCALCIFEROL, PO Take 1 capsule by mouth daily.       No current facility-administered medications for this visit.    Allergies:    Allergies  Allergen Reactions  . Actos [Pioglitazone] Swelling  . Bee Venom  Swelling  . Codeine Nausea Only    REACTION: hives  . Glucophage [Metformin Hcl] Nausea Only  . Raspberry Itching    Social History:  The patient  reports that he has quit smoking. He does not have any smokeless tobacco history on file. He reports that he drinks alcohol. He reports that he does not use illicit drugs.   Family History:  The patient's family history includes Heart disease in his father.   ROS:  Please see the history of present illness.  No nausea, vomiting.  No fevers, chills.  No focal weakness.  No dysuria. Feels poorly when taking a statin.  All other systems reviewed and negative.   PHYSICAL EXAM: VS:  BP 144/78  Pulse 93  Ht 5\' 6"  (1.676 m)  Wt 195 lb 12.8 oz (88.814 kg)  BMI 31.62 kg/m2 Well nourished, well developed, in no acute distress HEENT: normal Neck: no JVD, no carotid bruits Cardiac:  normal S1, S2; RRR;  Lungs:  clear to auscultation bilaterally, no wheezing, rhonchi or rales Abd: soft, nontender, no hepatomegaly Ext: no edema Skin: warm and dry Neuro:   no focal abnormalities noted  EKG:  Normal sinus rhythm, prolonged PR interval, inferior Q waves, no significant ST segment changes    ASSESSMENT AND PLAN:  Coronary atherosclerosis of native coronary artery  Continue Aspirin Tablet, 325 MG, 1 tablet, Orally, Once a day IMAGING: EKG   Harward,Amy 06/30/2012 11:06:15 AM > VARANASI,JAY 06/30/2012 11:41:53 AM > NSR, inf Q waves. No change.   Notes: No significant exertional angina. CONtinue aggresive secondary prevention.  2. Myocardial Infarction, Old  Continue Metoprolol Tartrate Tablet, 25 MG, take one (1) tablet(s) twice daily Notes: No CHF.  3. Pure hypercholesterolemia  Stopped Vytorin Tablet, 10-40 MG, 1 tablet, Orally, Once a day, atorvastatin-both due to fatigue Notes: LDL 63. Most recent LDL 132 in 2/15.  Intolerant of several statins. Will discuss with Pharm.D. regarding trial for new lipid-lowering drug.   Signed, Mina Marble, MD, [facility] 06/30/2013 10:49 AM

## 2013-06-30 NOTE — Patient Instructions (Signed)
Your physician has requested that you regularly monitor and record your blood pressure readings at home. Please use the same machine at the same time of day to check your readings and record them. Call in 2 weeks with BP readings. We may need to increase a medication.  Your physician wants you to follow-up in: 1 year with Dr. Irish Lack. You will receive a reminder letter in the mail two months in advance. If you don't receive a letter, please call our office to schedule the follow-up appointment.

## 2013-07-27 ENCOUNTER — Other Ambulatory Visit: Payer: Self-pay | Admitting: Interventional Cardiology

## 2013-11-03 LAB — HEMOGLOBIN A1C: Hgb A1c MFr Bld: 9.7 % — AB (ref 4.0–6.0)

## 2013-11-03 LAB — BASIC METABOLIC PANEL: Creatinine: 0.9 mg/dL (ref <=1.3)

## 2013-11-03 LAB — LIPID PANEL: LDL Cholesterol: 118 mg/dL

## 2014-01-09 LAB — HM COLONOSCOPY

## 2014-01-16 ENCOUNTER — Encounter: Payer: Self-pay | Admitting: Endocrinology

## 2014-01-16 ENCOUNTER — Telehealth: Payer: Self-pay | Admitting: Endocrinology

## 2014-01-16 ENCOUNTER — Ambulatory Visit (INDEPENDENT_AMBULATORY_CARE_PROVIDER_SITE_OTHER): Payer: Medicare HMO | Admitting: Endocrinology

## 2014-01-16 ENCOUNTER — Other Ambulatory Visit: Payer: Self-pay | Admitting: *Deleted

## 2014-01-16 VITALS — BP 147/83 | HR 75 | Temp 98.0°F | Resp 14 | Ht 66.0 in | Wt 194.4 lb

## 2014-01-16 DIAGNOSIS — E119 Type 2 diabetes mellitus without complications: Secondary | ICD-10-CM | POA: Insufficient documentation

## 2014-01-16 DIAGNOSIS — E1169 Type 2 diabetes mellitus with other specified complication: Secondary | ICD-10-CM | POA: Insufficient documentation

## 2014-01-16 DIAGNOSIS — IMO0001 Reserved for inherently not codable concepts without codable children: Secondary | ICD-10-CM

## 2014-01-16 DIAGNOSIS — N529 Male erectile dysfunction, unspecified: Secondary | ICD-10-CM

## 2014-01-16 DIAGNOSIS — E785 Hyperlipidemia, unspecified: Secondary | ICD-10-CM

## 2014-01-16 DIAGNOSIS — E1165 Type 2 diabetes mellitus with hyperglycemia: Principal | ICD-10-CM

## 2014-01-16 DIAGNOSIS — N521 Erectile dysfunction due to diseases classified elsewhere: Secondary | ICD-10-CM

## 2014-01-16 MED ORDER — INSULIN LISPRO 100 UNIT/ML (KWIKPEN): 10.0000 [IU] | PEN_INJECTOR | Freq: Three times a day (TID) | SUBCUTANEOUS | Status: DC

## 2014-01-16 NOTE — Telephone Encounter (Signed)
Patient stated that  Dr Dwyane Dee gave him a voucher for humulin pins pharmist said that he needed a prescription for it, send it to the [facility] on [address].

## 2014-01-16 NOTE — Telephone Encounter (Signed)
rx was sent

## 2014-01-16 NOTE — Progress Notes (Signed)
Patient ID: Matthew [name], male   DOB: February 27, 1946, 68 y.o.   MRN: [id number]           Reason for Appointment: Consultation for Type 2 Diabetes  Referring physician: Criss Rosales  History of Present Illness:          Diagnosis: Type 2 diabetes mellitus, date of diagnosis:  1992      Past history: His blood sugar was high at diagnosis when he was having a routine screening done. He was started on Glucophage initially which he took for about a year. He thinks it did not help his sugar much and he did not feel good with it Apparently he was trying to control his diabetes with diet and exercise for a few years. He may have tried Glucophage again before going on insulin. Also was started on Actos which caused swelling He has been treated mostly with premixed insulin for about 15 years and his level of control appears to be inadequate although details of previous treatment are not available. He may have taken Humalog at one time for use with high sugars A1c was 11.5 in 2011  Recent history: More recently has been treated by a new primary care physician and continued on his premixed insulin Because of his high A1c and need for better control he has been referred here for further management The patient currently is taking premixed insulin before breakfast and supper and is usually compliant with this He does not adjust the dose on his own and has been on the same dose for a while He checks his blood sugar only in the morning and he states that he is more concerned about how he is feeling rather than what his blood sugars and A1c are. Does not keep a record and did not bring his monitor for download today He was also given Levemir insulin to take on top of his premixed insulin by the PCP recently but he did not feel it helped his controlled and had some more hypoglycemia.       Oral hypoglycemic drugs the patient is taking are: None      Side effects from medications have been: Edema from Actos,? Nausea  and malaise from metformin INSULIN regimen is described as: NovoLog mix 70/30, 25 units twice a day    Compliance with the medical regimen: Fair Hypoglycemia: May occur about 3-4 days a week, more during the day and often related to activity level and occasionally during the night With hypoglycemia he has symptoms of weakness, sweating, shakiness and confusion  Glucose monitoring:  done one time a day         Glucometer: One Touch.      Blood Glucose readings by recall are 120-250, checking only in the mornings  Self-care: The diet that the patient has been following is: tries to limit high-fat meals      Meals: 3 meals per day.      Exercise: Racquetball about 3-4 days a week either in the morning before breakfast or late afternoon, biking, swimming on some days           Dietician visit, most recent: 15 years ago.               Weight history:  Wt Readings from Last 3 Encounters:  01/16/14 194 lb 6.4 oz (88.179 kg)  06/30/13 195 lb 12.8 oz (88.814 kg)  06/11/09 193 lb (87.544 kg)    Glycemic control:   Lab Results  Component Value  Date   HGBA1C 9.7* 11/03/2013   HGBA1C  Value: 11.5 (NOTE) The ADA recommends the following therapeutic goal for glycemic control related to Hgb A1c measurement: Goal of therapy: <6.5 Hgb A1c  Reference: American Diabetes Association: Clinical Practice Recommendations 2010, Diabetes Care, 2010, 33: (Suppl  1).* 06/18/2009   Lab Results  Component Value Date   LDLCALC 118 11/03/2013   CREATININE 0.9 11/03/2013         Medication List       This list is accurate as of: 01/16/14  4:10 PM.  Always use your most recent med list.               aspirin 325 MG tablet  Take 325 mg by mouth daily.     BD INSULIN SYRINGE ULTRAFINE 31G X 5/16" 0.3 ML Misc  Generic drug:  Insulin Syringe-Needle U-100     co-enzyme Q-10 30 MG capsule  Take 3 capsules (90 mg total) by mouth 2 (two) times daily.     insulin lispro 100 UNIT/ML KiwkPen  Commonly known  as:  HUMALOG KWIKPEN  Inject 0.1 mLs (10 Units total) into the skin 3 (three) times daily with meals.     lisinopril 5 MG tablet  Commonly known as:  PRINIVIL,ZESTRIL  Take 5 mg by mouth daily.     metoprolol tartrate 25 MG tablet  Commonly known as:  LOPRESSOR  TAKE 1 TABLET BY MOUTH TWICE DAILY.     NOVOLOG MIX 70/30 (70-30) 100 UNIT/ML injection  Generic drug:  insulin aspart protamine- aspart     ONETOUCH DELICA LANCETS 13Y Misc     ONETOUCH VERIO test strip  Generic drug:  glucose blood     VITAMIN D (ERGOCALCIFEROL) PO  Take 1 capsule by mouth daily.        Allergies:  Allergies  Allergen Reactions  . Actos [Pioglitazone] Swelling  . Bee Venom Swelling  . Codeine Nausea Only    REACTION: hives  . Glucophage [Metformin Hcl] Nausea Only  . Raspberry Itching    Past Medical History  Diagnosis Date  . Diabetes mellitus without complication   . Cancer     testicular  . Hyperlipidemia   . Hypertension   . Glaucoma   . Diabetic retinopathy     mild- Dr. Herbert Deaner  . Coronary artery disease   . 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 Cardiac bypass    . Hernia repair    . Elbow surgery    . Cardiac catheterization      2011  . Coronary artery bypass graft      LIMA-LAD, SVG-OM, SVG-PDA 06/20/09    Family History  Problem Relation Age of Onset  . Heart disease Father     Social History:  reports that he has quit smoking. He does not have any smokeless tobacco history on file. He reports that he drinks alcohol. He reports that he does not use illicit drugs.    Review of Systems       Vision is normal. Most recent eye exam was 2014. Has had glaucoma        Lipids: He was previously on Vytorin and Lipitor. He thinks he had myalgias and weakness when taking these drugs. He has been recommended statin drugs in the lipid clinic but he refuses to take them stating that  they will not help him. Also  has significantly increased LDL particle number       Lab Results  Component Value Date   LDLCALC 118 11/03/2013   TRIG 132 06/21/2013                  Skin: No rash or infections     Thyroid:  No  unusual fatigue.     The blood pressure has been normal and he is on lisinopril for "kidney protection"     No recent history of palpitations     No swelling of feet.     No shortness of breath or chest tightness  on exertion.     Bowel habits: Normal.      No joint  pains.          No history of Numbness, tingling or burning in feet         He has had erectile dysfunction for a few years but no decreased libido.     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 He thinks he has had a testosterone level checked a few years ago and this was reportedly normal   LABS:  Office Visit on 01/16/2014  Component Date Value Ref Range Status  . Creatinine 11/03/2013 0.9  .6 - 1.3 mg/dL Final  . LDL Cholesterol 11/03/2013 118   Final  . Hemoglobin A1C 11/03/2013 9.7* 4.0 - 6.0 % Final    Physical Examination:  BP 147/83  Pulse 75  Temp(Src) 98 F (36.7 C)  Resp 14  Ht 5\' 6"  (1.676 m)  Wt 194 lb 6.4 oz (88.179 kg)  BMI 31.39 kg/m2  SpO2 98%  GENERAL:         Patient is well built and nourished, no significant obesity    HEENT:         Eye exam shows normal external appearance. Fundus exam shows no retinopathy. Oral exam shows normal mucosa .  NECK:         General:  Neck exam shows no lymphadenopathy. Carotids are normal to palpation and no bruit heard.  Thyroid is not enlarged and no nodules felt.   LUNGS:         Chest is symmetrical. Lungs are clear to auscultation.Marland Kitchen   HEART:         Heart sounds:  S1 and S2 are normal. No murmurs or clicks heard., no S3 or S4.   ABDOMEN:   There is no distention present. Liver and spleen are not palpable. No other mass or tenderness present.  EXTREMITIES:     There is no edema. No skin lesions  present. Has mildly spoon shaped nails  NEUROLOGICAL:   Vibration sense is mildly reduced in toes. Ankle and biceps reflexes are absent bilaterally.          Diabetic foot exam shows normal monofilament sensation in the toes and plantar surfaces, no skin lesions or ulcers on the feet and normal pedal pulses MUSCULOSKELETAL:       There is no enlargement or deformity of the joints. Spine is normal to inspection.Marland Kitchen   SKIN:       No rash or lesions of concern.        ASSESSMENT:  Diabetes type 2, uncontrolled  He has had long-standing diabetes on insulin for about 15 years with apparently poor control Currently on premixed insulin only. He has only mild insulin resistance as he is taking about 50 units of insulin for his body weight of 88 kg The  patient has significant elevation of A1c of 9.7% in July and also over 11% in 2011 Discussed that he is likely to be significantly insulin deficient Have recommended that he go on a basal bolus insulin regimen instead of premixed insulin Discussed with the patient in detail how normal physiological insulin secretion occurs and how basal insulin needs to be given separately from mealtime coverage with bolus insulin. The patient had initial difficulty understanding the concept but agrees to try the program He is also concerned about the cost of his insulin since he is in the Medicare gap Although he may also benefit from adding a drug like Invokana he is refusing to take any oral agents at the same time especially with the cost  Complications: Reportedly has background retinopathy, has only minimal signs of neuropathy. 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  High normal blood pressure today, continue to follow and may consider increasing lisinopril. Also needs to have urine microalbumin checked when glucose controlled  PLAN:   Levemir 12  units twice a day and 8 units Humalog a.c. 3 times a day  Discussed adjustment of both insulin doses based on his blood sugar patterns, exercise and meal size  Detailed instructions given, see below  Increase frequency of glucose monitoring to at least 2-3 times a day at various times including after meals as discussed  Followup and nurse educator in one week in the office in 2 weeks  A1c to be rechecked in about 2 months  Patient Instructions  Please check blood sugars before each meal at least once a day about 2 hours after any meal and  daily on waking up.  Please bring blood sugar monitor to each visit  LEVEMIR insulin: Take 12 units twice a day about 12 hours apart. Adjust the dose every 3 days as given on the flow sheet May reduce the morning dose by 2-3 units if planning to be active in the morning hours and reduced the EVENING dose if being active in the afternoons Blood sugar in the morning and before supper should be usually in the 80-140 range  HUMALOG insulin: Take an average of 8 units before each meal and may go up or down 2 units based on size of the meal or any planned activity after the meal Blood sugars about 2 hours after meals should be at least under 180    Counseling time over 50% of today's 60 minute visit   KUMAR,AJAY 01/16/2014, 4:10 PM   Note: This office note was prepared with Dragon voice recognition system technology. Any transcriptional errors that result from this process are unintentional.

## 2014-01-16 NOTE — Patient Instructions (Signed)
Please check blood sugars before each meal at least once a day about 2 hours after any meal and  daily on waking up.  Please bring blood sugar monitor to each visit  LEVEMIR insulin: Take 12 units twice a day about 12 hours apart. Adjust the dose every 3 days as given on the flow sheet May reduce the morning dose by 2-3 units if planning to be active in the morning hours and reduced the EVENING dose if being active in the afternoons Blood sugar in the morning and before supper should be usually in the 80-140 range  HUMALOG insulin: Take an average of 8 units before each meal and may go up or down 2 units based on size of the meal or any planned activity after the meal Blood sugars about 2 hours after meals should be at least under 180

## 2014-01-25 ENCOUNTER — Encounter: Payer: Self-pay | Admitting: Physician Assistant

## 2014-01-25 ENCOUNTER — Ambulatory Visit (INDEPENDENT_AMBULATORY_CARE_PROVIDER_SITE_OTHER): Payer: Managed Care, Other (non HMO) | Admitting: Physician Assistant

## 2014-01-25 VITALS — BP 128/72 | HR 98 | Ht 66.0 in | Wt 197.0 lb

## 2014-01-25 DIAGNOSIS — I2510 Atherosclerotic heart disease of native coronary artery without angina pectoris: Secondary | ICD-10-CM

## 2014-01-25 DIAGNOSIS — E1159 Type 2 diabetes mellitus with other circulatory complications: Secondary | ICD-10-CM

## 2014-01-25 DIAGNOSIS — E785 Hyperlipidemia, unspecified: Secondary | ICD-10-CM

## 2014-01-25 DIAGNOSIS — I4891 Unspecified atrial fibrillation: Secondary | ICD-10-CM | POA: Insufficient documentation

## 2014-01-25 LAB — BASIC METABOLIC PANEL
BUN: 18 mg/dL (ref 6–23)
CO2: 29 mEq/L (ref 19–32)
Calcium: 9 mg/dL (ref 8.4–10.5)
Chloride: 101 mEq/L (ref 96–112)
Creatinine, Ser: 1 mg/dL (ref 0.4–1.5)
GFR: 82.36 mL/min (ref >60.00)
Glucose, Bld: 222 mg/dL — ABNORMAL HIGH (ref 70–99)
Potassium: 4.2 mEq/L (ref 3.5–5.1)
Sodium: 135 mEq/L (ref 135–145)

## 2014-01-25 LAB — T4, FREE: Free T4: 1.06 ng/dL (ref 0.60–1.60)

## 2014-01-25 LAB — CBC WITH DIFFERENTIAL/PLATELET
Basophils Absolute: 0 10*3/uL (ref 0.0–0.1)
Basophils Relative: 0.5 % (ref 0.0–3.0)
Eosinophils Absolute: 0.3 10*3/uL (ref 0.0–0.7)
Eosinophils Relative: 4.7 % (ref 0.0–5.0)
HCT: 37.9 % — ABNORMAL LOW (ref 39.0–52.0)
Hemoglobin: 12.6 g/dL — ABNORMAL LOW (ref 13.0–17.0)
Lymphocytes Relative: 22.1 % (ref 12.0–46.0)
Lymphs Abs: 1.3 10*3/uL (ref 0.7–4.0)
MCHC: 33.2 g/dL (ref 30.0–36.0)
MCV: 93.2 fl (ref 78.0–100.0)
Monocytes Absolute: 0.5 10*3/uL (ref 0.1–1.0)
Monocytes Relative: 8.2 % (ref 3.0–12.0)
Neutro Abs: 3.8 10*3/uL (ref 1.4–7.7)
Neutrophils Relative %: 64.5 % (ref 43.0–77.0)
Platelets: 261 10*3/uL (ref 150.0–400.0)
RBC: 4.06 Mil/uL — ABNORMAL LOW (ref 4.22–5.81)
RDW: 13.7 % (ref 11.5–15.5)
WBC: 5.8 10*3/uL (ref 4.0–10.5)

## 2014-01-25 LAB — BRAIN NATRIURETIC PEPTIDE: Pro B Natriuretic peptide (BNP): 122 pg/mL — ABNORMAL HIGH (ref 0.0–100.0)

## 2014-01-25 LAB — MAGNESIUM: Magnesium: 2.1 mg/dL (ref 1.5–2.5)

## 2014-01-25 LAB — TSH: TSH: 2.95 u[IU]/mL (ref 0.35–4.50)

## 2014-01-25 MED ORDER — METOPROLOL TARTRATE 50 MG PO TABS: ORAL_TABLET | ORAL | Status: DC

## 2014-01-25 NOTE — Progress Notes (Signed)
[facility], [address], [place]  [address] Phone: [phone] Fax:  [phone]  Date:  01/25/2014   Patient ID:  Matthew [name], DOB Apr 01, 1946, MRN [id number]   PCP:  Elyn Peers, MD  Cardiologist:  Irish Lack  History of Present Illness: Matthew [name] is a 68 y.o. male with history of CAD s/p CABG 2011 (normal LVF by echo 2011), DM, HLD (intolerant of several statins) who presents to clinic today for evaluation of newly recognized atrial fibrillation recently.  He knew something wasn't quite right the last few weeks. He had a colonoscopy about 2 weeks ago for anemia (normal per patient) and has felt fatigued since that time. He also wonders if he wasn't feeling great before then either. He reports sensation of heart fluttering, particularly while lying down. He saw his endocrinologist who noted irregular HR who referred him back to his PCP Dr. Criss Rosales who noted AF on EKG. She started him on Eliquis and asked that he f/u here. He has had occasional burping and orthopnea. He denies any exertional dyspnea - he plays racketball. No recent chest pain. He generally feels more tired than usual. No bleeding. CHADSVASC = 3 for age, diabetes and vascular disease. He adamantly reports that he does not have hx of HTN and is only on antihypertensives because of other comorbidities (ACEI for DM, BB for CAD) No hx of stroke or TIA.  Recent Labs: 06/21/2013: ALT 17  11/03/2013: Creatinine 0.9; LDL (calc) 118   Wt Readings from Last 3 Encounters:  01/25/14 197 lb (89.359 kg)  01/16/14 194 lb 6.4 oz (88.179 kg)  06/30/13 195 lb 12.8 oz (88.814 kg)     Past Medical History  Diagnosis Date  . Diabetes mellitus without complication   . Testicular cancer   . Hyperlipidemia     a. Intolerant of lipitor and vytorin.  . Glaucoma   . Diabetic retinopathy     mild- Dr. Herbert Deaner  . Coronary artery disease     a. s/p CABG 2011.   . 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 BD INSULIN SYRINGE ULTRAFINE 31G X 5/16" 0.3 ML MISC       . co-enzyme Q-10 30 MG capsule Take 3 capsules (90 mg total) by mouth 2 (two) times daily.      Marland Kitchen ELIQUIS 5 MG TABS tablet 1 tab twice a day      . insulin detemir (LEVEMIR) 100 UNIT/ML injection Inject into the skin at bedtime. Inject 12 units  In the am and 12 units in the pm      . insulin lispro (HUMALOG KWIKPEN) 100 UNIT/ML KiwkPen Inject 0.1 mLs (10 Units total) into the skin 3 (three) times daily with meals.  15 mL  0  . lisinopril (PRINIVIL,ZESTRIL) 5 MG tablet Take 5 mg by mouth daily.      . metoprolol tartrate (LOPRESSOR) 50 MG tablet TAKE 1 TABLET BY MOUTH TWICE DAILY.  60 tablet  11  . VITAMIN D, ERGOCALCIFEROL, PO Take 1 capsule by mouth daily.       No current facility-administered medications for this visit.    Allergies:   Actos; Bee venom; Codeine; Glucophage; and Raspberry   Social History:  The patient  reports that he has quit smoking. He does not have any smokeless tobacco history on file. He reports that he drinks alcohol.  He reports that he does not use illicit drugs.   Family History:  The patient's family history includes Heart disease in his father.   ROS:  Please see the history of present illness.    All other systems reviewed and negative.   PHYSICAL EXAM:  VS:  BP 128/72  Pulse 98  Ht 5\' 6"  (1.676 m)  Wt 197 lb (89.359 kg)  BMI 31.81 kg/m2 Well nourished, well developed, in no acute distress HEENT: normal Neck: no JVD Cardiac:  normal S1, S2; irregularly irregular; no murmur Lungs:  clear to auscultation bilaterally, no wheezing, rhonchi or rales Abd: soft, nontender, no hepatomegaly Ext: no edema Skin: warm and dry Neuro:  moves all extremities spontaneously, no focal abnormalities noted  EKG:  Coarse atrial fibrillation vs flutter 98bpm, inferior infarct age undetermined, no acute ST-T changes     ASSESSMENT AND PLAN:  1. Coarse atrial fibrillation vs flutter - newly recognized, of unclear duration. He has been started on Eliquis by his PCP. Will check basic labs today including CBC, BMET, Mg, TSH, and free T4. Will also check BNP given reported orthopnea. Obtain 2D echo. I discussed option of TEE/DCCV vs anticoag for 4 weeks with subsequent cardioversion and he wishes to plan for the latter. Med adherance reinforced. Increase metoprolol to 50mg  BID for improved rate control. If BNP is elevated or EF down, I might suggest he reconsider TEE/DCCV sooner to re-establish NSR - I did not prescribe any Lasix today because I think this is related to AF and he tells me he is "not a pill person" and does not wish to add more medicine at this time.  2. CAD s/p CABG 2011 - no recent chest pain, but he has had some orthopnea. He is having some burping. Will obtain nuclear stress test to exclude progressive ischemia. 3. Hyperlipidemia - he does not want to try a different statin at this time. We discussed indications but he wishes to defer for now. 4. Diabetes mellitus - followed by endocrinology.  Dispo: F/u 3 weeks with Dr. Irish Lack or APP.  Signed, Melina Copa, PA-C  01/25/2014 5:31 PM

## 2014-01-25 NOTE — Patient Instructions (Signed)
Your physician has recommended you make the following change in your medication:  1) INCREASE Metoprolol to 50mg  twice daily. An Rx has been sent to your pharmacy  Lab Today: Bmet,Cbc,Tsh,Bnp,Free T4, [place] physician has requested that you have en exercise stress myoview. For further information please visit [email or web address]. Please follow instruction sheet, as given.  Your physician has requested that you have an echocardiogram. Echocardiography is a painless test that uses sound waves to create images of your heart. It provides your doctor with information about the size and shape of your heart and how well your heart's chambers and valves are working. This procedure takes approximately one hour. There are no restrictions for this procedure.  Your physician recommends that you schedule a follow-up appointment in: 3 weeks with Dr.Varanasi/or PA,NP

## 2014-01-31 ENCOUNTER — Telehealth: Payer: Self-pay | Admitting: *Deleted

## 2014-01-31 NOTE — Telephone Encounter (Signed)
pt notified about lab results. Per Melina Copa, PA pt aware he will see CVRR in 1 month due to new start Eliquis. Pt asked what test he was having done 10/12, I verified his upcoming test appts with him. Pt verbalized Plan of Care

## 2014-02-05 ENCOUNTER — Ambulatory Visit (HOSPITAL_COMMUNITY): Payer: Medicare HMO | Attending: Cardiology | Admitting: Radiology

## 2014-02-05 ENCOUNTER — Other Ambulatory Visit (HOSPITAL_COMMUNITY): Payer: Medicare HMO | Admitting: Cardiology

## 2014-02-05 ENCOUNTER — Ambulatory Visit (HOSPITAL_BASED_OUTPATIENT_CLINIC_OR_DEPARTMENT_OTHER): Payer: Medicare HMO | Admitting: Radiology

## 2014-02-05 VITALS — BP 141/72 | Ht 66.0 in | Wt 191.0 lb

## 2014-02-05 DIAGNOSIS — I4891 Unspecified atrial fibrillation: Secondary | ICD-10-CM

## 2014-02-05 DIAGNOSIS — I2510 Atherosclerotic heart disease of native coronary artery without angina pectoris: Secondary | ICD-10-CM

## 2014-02-05 DIAGNOSIS — E119 Type 2 diabetes mellitus without complications: Secondary | ICD-10-CM | POA: Insufficient documentation

## 2014-02-05 DIAGNOSIS — E785 Hyperlipidemia, unspecified: Secondary | ICD-10-CM | POA: Insufficient documentation

## 2014-02-05 DIAGNOSIS — I25119 Atherosclerotic heart disease of native coronary artery with unspecified angina pectoris: Secondary | ICD-10-CM

## 2014-02-05 MED ORDER — TECHNETIUM TC 99M SESTAMIBI GENERIC - CARDIOLITE
33.0000 | Freq: Once | INTRAVENOUS | Status: AC | PRN
  Administered 2014-02-05: 33 via INTRAVENOUS

## 2014-02-05 MED ORDER — PERFLUTREN LIPID MICROSPHERE
2.0000 mL | Freq: Once | INTRAVENOUS | Status: AC
  Administered 2014-02-05: 2 mL via INTRAVENOUS

## 2014-02-05 MED ORDER — TECHNETIUM TC 99M SESTAMIBI GENERIC - CARDIOLITE
11.0000 | Freq: Once | INTRAVENOUS | Status: AC | PRN
  Administered 2014-02-05: 11 via INTRAVENOUS

## 2014-02-05 NOTE — Progress Notes (Signed)
[facility] [address] [place], [place] [address] [phone]    Cardiology Nuclear Med Study  Stancil VERL KITSON is a 68 y.o. male     MRN : [id number]     DOB: 04/14/1946  Procedure Date: 02/05/2014  Nuclear Med Background Indication for Stress Test:  Evaluation for Ischemia and Graft Patency History:  CABG, CAD, AFIB,  Cardiac Risk Factors: Carotid Disease, Hypertension and IDDM   Symptoms:  DOE, Fatigue and Palpitations   Nuclear Pre-Procedure Caffeine/Decaff Intake:  None> 12 hrs NPO After: 8:30pm   Lungs:  clear O2 Sat: 98% on room air. IV 0.9% NS with Angio Cath:  22g  IV Site: R Antecubital x 1, tolerated well IV Started by:  Irven Baltimore, RN  Chest Size (in):  40 Cup Size: n/a  Height: 5\' 6"  (1.676 m)  Weight:  191 lb (86.637 kg)  BMI:  Body mass index is 30.84 kg/(m^2). Tech Comments:  Last dose Levemir yesterday am, no insulin today. Fasting CBG was 236 at 0645 today.Patient held Lopressor x 24 hrs. Irven Baltimore, RN     Nuclear Med Study 1 or 2 day study: 1 day  Stress Test Type:  Stress  Reading MD: N/A  Order Authorizing Provider:  Larae Grooms, MD  Resting Radionuclide: Technetium 58m Sestamibi  Resting Radionuclide Dose: 11.0 mCi   Stress Radionuclide:  Technetium 82m Sestamibi  Stress Radionuclide Dose: 33.0 mCi           Stress Protocol Rest HR: 100 Stress HR: 166  Rest BP: 141/72 Stress BP: 191/78  Exercise Time (min): 4:16 METS: 6.10   Predicted Max HR: 155 bpm % Max HR: 107.1 bpm Rate Pressure Product: 31706   Dose of Adenosine (mg):  n/a Dose of Lexiscan: n/a mg  Dose of Atropine (mg): n/a Dose of Dobutamine: n/a mcg/kg/min (at max HR)  Stress Test Technologist: Perrin Maltese, EMT-P  Nuclear Technologist:  Vedia Pereyra, CNMT     Rest Procedure:  Myocardial perfusion imaging was performed at rest 45 minutes following the intravenous administration of Technetium 8m Sestamibi. Rest ECG: Atrial  Fibrilliation  Stress Procedure:  The patient exercised on the treadmill utilizing the Bruce Protocol for 4:16 minutes. The patient stopped due to sob and denied any chest pain. Technetium 43m Sestamibi was injected at peak exercise and myocardial perfusion imaging was performed after a brief delay. Stress ECG: A-fib with RVR  QPS Raw Data Images:  Normal; no motion artifact; normal heart/lung ratio. Stress Images:  Inferolateral perfusion defect Rest Images:  Inferolateral perfusion defect Subtraction (SDS):  No evidence of ischemia. Transient Ischemic Dilatation (Normal <1.22):  0.94 Lung/Heart Ratio (Normal <0.45):  0.36  Quantitative Gated Spect Images QGS EDV:  93 ml QGS ESV:  40 ml  Impression Exercise Capacity:  Poor exercise capacity. BP Response:  Normal blood pressure response. Clinical Symptoms:  There is dyspnea. ECG Impression:  A-fib with RVR at baseline and with mild exercise. Comparison with Prior Nuclear Study: No previous nuclear study performed  Overall Impression:  Intermediate risk stress nuclear study with large-sized (Extent 18%), severe intensity fixed inferolateral defect consitent with LCx territory scar.  LV Ejection Fraction: 57%.  LV Wall Motion:  inferolateral akinesis  Pixie Casino, MD, [facility] Board Certified in Nuclear Cardiology Attending Cardiologist [facility]

## 2014-02-05 NOTE — Progress Notes (Signed)
Echocardiogram performed.  

## 2014-02-08 ENCOUNTER — Ambulatory Visit (INDEPENDENT_AMBULATORY_CARE_PROVIDER_SITE_OTHER): Payer: Managed Care, Other (non HMO) | Admitting: Endocrinology

## 2014-02-08 ENCOUNTER — Encounter: Payer: Self-pay | Admitting: Physician Assistant

## 2014-02-08 ENCOUNTER — Other Ambulatory Visit: Payer: Self-pay | Admitting: *Deleted

## 2014-02-08 VITALS — BP 136/88 | HR 99 | Temp 98.0°F | Resp 14 | Ht 66.0 in | Wt 190.8 lb

## 2014-02-08 DIAGNOSIS — IMO0002 Reserved for concepts with insufficient information to code with codable children: Secondary | ICD-10-CM

## 2014-02-08 DIAGNOSIS — E1165 Type 2 diabetes mellitus with hyperglycemia: Secondary | ICD-10-CM

## 2014-02-08 DIAGNOSIS — I38 Endocarditis, valve unspecified: Secondary | ICD-10-CM | POA: Insufficient documentation

## 2014-02-08 MED ORDER — INSULIN PEN NEEDLE 31G X 8 MM MISC: Status: DC

## 2014-02-08 NOTE — Patient Instructions (Signed)
30 Lantus at night and increase every 3 days until am sugar in am in the 90-130 range   Sugar about 2 hours after meals should rise 30-60 mg average  Humalog as discussed before meals

## 2014-02-08 NOTE — Progress Notes (Signed)
Patient ID: Matthew [name], male   DOB: [birth year], 68 y.o.   MRN: [id number]           Reason for Appointment: Followup for Type 2 Diabetes  Referring physician: Criss Rosales  History of Present Illness:          Diagnosis: Type 2 diabetes mellitus, date of diagnosis:  1992      Past history: His blood sugar was high at diagnosis when he was having a routine screening done. He was started on Glucophage initially which he took for about a year. He thinks it did not help his sugar much and he did not feel good with it Apparently he was trying to control his diabetes with diet and exercise for a few years. He may have tried Glucophage again before going on insulin. Also was started on Actos which caused swelling He has been treated mostly with premixed insulin for about 15 years and his level of control appears to be inadequate although details of previous treatment are not available. He may have taken Humalog at one time for use with high sugars A1c was 11.5 in 2011  Recent history: On his initial consultation in 9/15 because of poor control with premixed insulin he was switched to Levemir twice a day and Humalog with meals. However he did not increase her Levemir as directed and fasting blood sugars had been running consistently high Also a few days ago he ran out of his Levemir and did not call for a prescription Blood sugars in the last few days have been as high as 508; he is taking only Humalog as needed depending on blood sugar level Otherwise has been trying to take Humalog with his main meals Has not had any hypoglycemia which he was having previously with a premixed insulin Hypoglycemia: None       Oral hypoglycemic drugs the patient is taking are: None      Side effects from medications have been: Edema from Actos,? Nausea and malaise from metformin INSULIN regimen is described as: Levemir previously 12 units twice a day, Humalog 8 units 3 times a day with meals  Compliance with the  medical regimen: Fair  With hypoglycemia he has symptoms of weakness, sweating, shakiness and confusion  Glucose monitoring:  done one time a day         Glucometer: One Touch.      Blood Glucose readings by download  PREMEAL Breakfast Lunch  6 PM   7-8 PM  Overall  Glucose range:  132-335   155-197   109-219   96-508    Median:  235    160   189   194    Self-care: The diet that the patient has been following is: tries to limit high-fat meals      Meals: 3 meals per day.      Exercise: Racquetball about 3-4 days a week mostly in morning before breakfast , sometimes late afternoon, biking, swimming at times            Dietician visit, most recent: 15 years ago.               Weight history:  Wt Readings from Last 3 Encounters:  02/08/14 190 lb 12.8 oz (86.546 kg)  02/05/14 191 lb (86.637 kg)  01/25/14 197 lb (89.359 kg)    Glycemic control:   Lab Results  Component Value Date   HGBA1C 9.7* 11/03/2013   HGBA1C  Value: 11.5 (NOTE) The ADA  recommends the following therapeutic goal for glycemic control related to Hgb A1c measurement: Goal of therapy: <6.5 Hgb A1c  Reference: American Diabetes Association: Clinical Practice Recommendations 2010, Diabetes Care, 2010, 33: (Suppl  1).* 06/18/2009   Lab Results  Component Value Date   LDLCALC 118 11/03/2013   CREATININE 1.0 01/25/2014             Medication List       This list is accurate as of: 02/08/14 11:59 PM.  Always use your most recent med list.               BD INSULIN SYRINGE ULTRAFINE 31G X 5/16" 0.3 ML Misc  Generic drug:  Insulin Syringe-Needle U-100     co-enzyme Q-10 30 MG capsule  Take 3 capsules (90 mg total) by mouth 2 (two) times daily.     ELIQUIS 5 MG Tabs tablet  Generic drug:  apixaban  1 tab twice a day     insulin detemir 100 UNIT/ML injection  Commonly known as:  LEVEMIR  Inject into the skin at bedtime. Inject 12 units  In the am and 12 units in the pm     insulin lispro 100 UNIT/ML  KiwkPen  Commonly known as:  HUMALOG KWIKPEN  Inject 0.1 mLs (10 Units total) into the skin 3 (three) times daily with meals.     Insulin Pen Needle 31G X 8 MM Misc  Commonly known as:  B-D ULTRAFINE III SHORT PEN  Use as directed 4 times per day     lisinopril 5 MG tablet  Commonly known as:  PRINIVIL,ZESTRIL  Take 5 mg by mouth daily.     metoprolol 50 MG tablet  Commonly known as:  LOPRESSOR  TAKE 1 TABLET BY MOUTH TWICE DAILY.     NOVOLOG MIX 70/30 (70-30) 100 UNIT/ML injection  Generic drug:  insulin aspart protamine- aspart     VITAMIN D (ERGOCALCIFEROL) PO  Take 1 capsule by mouth daily.        Allergies:  Allergies  Allergen Reactions  . Actos [Pioglitazone] Swelling  . Bee Venom Swelling  . Codeine Nausea Only    REACTION: hives  . Glucophage [Metformin Hcl] Nausea Only  . Raspberry Itching    Past Medical History  Diagnosis Date  . Diabetes mellitus without complication   . Testicular cancer   . Hyperlipidemia     a. Intolerant of lipitor and vytorin.  . Glaucoma   . Diabetic retinopathy     mild- Dr. Herbert Deaner  . Coronary artery disease     a. s/p CABG 2011. b. Nuc 01/2014: inf-lat scar but no ischemia, EF 57%.  . MI, old     mild LV dysfunction, EF 45-50%, inferior posterior akinesis  . Valvular heart disease     a. Mild AI/MS, mod MR by echo 01/2014.    Past Surgical History  Procedure Laterality Date  . Cardiac bypass    . Hernia repair    . Elbow surgery    . Cardiac catheterization      2011  . Coronary artery bypass graft      LIMA-LAD, SVG-OM, SVG-PDA 06/20/09    Family History  Problem Relation Age of Onset  . Heart disease Father     Social History:  reports that he has quit smoking. He does not have any smokeless tobacco history on file. He reports that he drinks alcohol. He reports that he does not use illicit drugs.    Review of  Systems       Vision is normal. Most recent eye exam was 2014. Has had glaucoma        Lipids:  He was previously on Vytorin and Lipitor. He thinks he had myalgias and weakness when taking these drugs. He has been recommended statin drugs in the lipid clinic but he refuses to take them stating that they will not help him. Also has significantly increased LDL particle number       Lab Results  Component Value Date   LDLCALC 118 11/03/2013   TRIG 132 06/21/2013               LABS:  No visits with results within 1 Week(s) from this visit. Latest known visit with results is:  Office Visit on 01/25/2014  Component Date Value Ref Range Status  . Sodium 01/25/2014 135  135 - 145 mEq/L Final  . Potassium 01/25/2014 4.2  3.5 - 5.1 mEq/L Final  . Chloride 01/25/2014 101  96 - 112 mEq/L Final  . CO2 01/25/2014 29  19 - 32 mEq/L Final  . Glucose, Bld 01/25/2014 222* 70 - 99 mg/dL Final  . BUN 01/25/2014 18  6 - 23 mg/dL Final  . Creatinine, Ser 01/25/2014 1.0  0.4 - 1.5 mg/dL Final  . Calcium 01/25/2014 9.0  8.4 - 10.5 mg/dL Final  . GFR 01/25/2014 82.36  >60.00 mL/min Final  . WBC 01/25/2014 5.8  4.0 - 10.5 K/uL Final  . RBC 01/25/2014 4.06* 4.22 - 5.81 Mil/uL Final  . Hemoglobin 01/25/2014 12.6* 13.0 - 17.0 g/dL Final  . HCT 01/25/2014 37.9* 39.0 - 52.0 % Final  . MCV 01/25/2014 93.2  78.0 - 100.0 fl Final  . MCHC 01/25/2014 33.2  30.0 - 36.0 g/dL Final  . RDW 01/25/2014 13.7  11.5 - 15.5 % Final  . Platelets 01/25/2014 261.0  150.0 - 400.0 K/uL Final  . Neutrophils Relative % 01/25/2014 64.5  43.0 - 77.0 % Final  . Lymphocytes Relative 01/25/2014 22.1  12.0 - 46.0 % Final  . Monocytes Relative 01/25/2014 8.2  3.0 - 12.0 % Final  . Eosinophils Relative 01/25/2014 4.7  0.0 - 5.0 % Final  . Basophils Relative 01/25/2014 0.5  0.0 - 3.0 % Final  . Neutro Abs 01/25/2014 3.8  1.4 - 7.7 K/uL Final  . Lymphs Abs 01/25/2014 1.3  0.7 - 4.0 K/uL Final  . Monocytes Absolute 01/25/2014 0.5  0.1 - 1.0 K/uL Final  . Eosinophils Absolute 01/25/2014 0.3  0.0 - 0.7 K/uL Final  . Basophils Absolute  01/25/2014 0.0  0.0 - 0.1 K/uL Final  . TSH 01/25/2014 2.95  0.35 - 4.50 uIU/mL Final  . Pro B Natriuretic peptide (BNP) 01/25/2014 122.0* 0.0 - 100.0 pg/mL Final  . Magnesium 01/25/2014 2.1  1.5 - 2.5 mg/dL Final  . Free T4 01/25/2014 1.06  0.60 - 1.60 ng/dL Final    Physical Examination:  BP 136/88  Pulse 99  Temp(Src) 98 F (36.7 C)  Resp 14  Ht 5\' 6"  (1.676 m)  Wt 190 lb 12.8 oz (86.546 kg)  BMI 30.81 kg/m2  SpO2 95%    ASSESSMENT:  Diabetes type 2, uncontrolled  He has had long-standing diabetes on insulin for about 15 years with usually poor control With taking relatively small doses of Levemir he was having mostly high fasting readings but relatively better readings before supper Has not checked any readings after dinner See history of present illness for details of his current management of  problems identified in blood sugar patterns He is accepting the idea of taking a basal bolus insulin regimen He was recommended to take Toujeo insulin consistent control but he apparently has some supplies of Lantus at home and wants to try this. Discussed differences between the basal insulins and need for adjusting basal insulin to keep fasting blood sugar and targets He is also concerned about the out-of-pocket expense He has been recommended followup with nurse educator but he refuses  PLAN:   Lantus 30 units at night and titrate every 3 days based on the flow sheet given to get morning sugars under 130  Continue Humalog 8-10 units based on meal size, may increase further if readings after meals are rising more than 40-60 mg  Reduce Humalog by 4-5 units before exercise  Consider switching to Toujeo when Lantus is finished  A1c to be rechecked on next visit  Patient Instructions  30 Lantus at night and increase every 3 days until am sugar in am in the 90-130 range   Sugar about 2 hours after meals should rise 30-60 mg average  Humalog as discussed before  meals   Counseling time over 50% of today's 25 minute visit   [place] 02/09/2014, 10:17 AM   Note: This office note was prepared with Estate agent. Any transcriptional errors that result from this process are unintentional.

## 2014-02-09 ENCOUNTER — Telehealth: Payer: Self-pay | Admitting: Interventional Cardiology

## 2014-02-09 NOTE — Telephone Encounter (Signed)
Patient would like test results. Please call and advise.  °

## 2014-02-09 NOTE — Telephone Encounter (Signed)
pt notified about both echo and myoview results with verbal understanding of what both results showed. I asked pt how his HR at home was and he said he had not been checking it. I asked pt to monitor HR and bring readings to appt nxt week, pt said ok

## 2014-02-12 ENCOUNTER — Ambulatory Visit: Payer: Managed Care, Other (non HMO) | Admitting: Nutrition

## 2014-02-15 ENCOUNTER — Ambulatory Visit (INDEPENDENT_AMBULATORY_CARE_PROVIDER_SITE_OTHER): Payer: Medicare HMO | Admitting: *Deleted

## 2014-02-15 ENCOUNTER — Encounter: Payer: Self-pay | Admitting: *Deleted

## 2014-02-15 ENCOUNTER — Encounter: Payer: Self-pay | Admitting: Physician Assistant

## 2014-02-15 ENCOUNTER — Telehealth: Payer: Self-pay | Admitting: *Deleted

## 2014-02-15 ENCOUNTER — Ambulatory Visit (INDEPENDENT_AMBULATORY_CARE_PROVIDER_SITE_OTHER): Payer: Medicare HMO | Admitting: Physician Assistant

## 2014-02-15 VITALS — BP 130/62 | HR 71 | Ht 66.0 in | Wt 194.0 lb

## 2014-02-15 DIAGNOSIS — I4892 Unspecified atrial flutter: Secondary | ICD-10-CM

## 2014-02-15 DIAGNOSIS — I4891 Unspecified atrial fibrillation: Secondary | ICD-10-CM

## 2014-02-15 DIAGNOSIS — E7800 Pure hypercholesterolemia, unspecified: Secondary | ICD-10-CM

## 2014-02-15 DIAGNOSIS — E1159 Type 2 diabetes mellitus with other circulatory complications: Secondary | ICD-10-CM

## 2014-02-15 DIAGNOSIS — I2510 Atherosclerotic heart disease of native coronary artery without angina pectoris: Secondary | ICD-10-CM

## 2014-02-15 DIAGNOSIS — I38 Endocarditis, valve unspecified: Secondary | ICD-10-CM

## 2014-02-15 LAB — CBC WITH DIFFERENTIAL/PLATELET
Basophils Absolute: 0 10*3/uL (ref 0.0–0.1)
Basophils Relative: 0.2 % (ref 0.0–3.0)
Eosinophils Absolute: 0.3 10*3/uL (ref 0.0–0.7)
Eosinophils Relative: 3.8 % (ref 0.0–5.0)
HCT: 39.9 % (ref 39.0–52.0)
Hemoglobin: 13.1 g/dL (ref 13.0–17.0)
Lymphocytes Relative: 22 % (ref 12.0–46.0)
Lymphs Abs: 1.5 10*3/uL (ref 0.7–4.0)
MCHC: 32.9 g/dL (ref 30.0–36.0)
MCV: 90.1 fl (ref 78.0–100.0)
Monocytes Absolute: 0.6 10*3/uL (ref 0.1–1.0)
Monocytes Relative: 8.6 % (ref 3.0–12.0)
Neutro Abs: 4.4 10*3/uL (ref 1.4–7.7)
Neutrophils Relative %: 65.4 % (ref 43.0–77.0)
Platelets: 270 10*3/uL (ref 150.0–400.0)
RBC: 4.43 Mil/uL (ref 4.22–5.81)
RDW: 13.9 % (ref 11.5–15.5)
WBC: 6.8 10*3/uL (ref 4.0–10.5)

## 2014-02-15 LAB — BASIC METABOLIC PANEL
BUN: 20 mg/dL (ref 6–23)
CO2: 30 mEq/L (ref 19–32)
Calcium: 9.5 mg/dL (ref 8.4–10.5)
Chloride: 101 mEq/L (ref 96–112)
Creatinine, Ser: 0.9 mg/dL (ref 0.4–1.5)
GFR: 85.39 mL/min (ref >60.00)
Glucose, Bld: 199 mg/dL — ABNORMAL HIGH (ref 70–99)
Potassium: 4.4 mEq/L (ref 3.5–5.1)
Sodium: 137 mEq/L (ref 135–145)

## 2014-02-15 NOTE — Progress Notes (Signed)
Pt was started on Eliquis  5mg   Bid for Atrial Fib on  on 01/23/2014  Reviewed patients medication list.  Pt is not currently on any combined P-gp and strong CYP3A4 inhibitors/inducers (ketoconazole, traconazole, ritonavir, carbamazepine, phenytoin, rifampin, St. John's wort).  Reviewed labs.  SCr 0.9, Weight 87.998,   Dose is appropriate based on labs.   Hgb 13.1 and HCT 39.9  A full discussion of the nature of anticoagulants has been carried out.  A benefit/risk analysis has been presented to the patient, so that they understand the justification for choosing anticoagulation with Eliquis at this time.  The need for compliance is stressed.  Pt is aware to take the medication twice daily.  Side effects of potential bleeding are discussed, including unusual colored urine or stools, coughing up blood or coffee ground emesis, nose bleeds or serious fall or head trauma.  Discussed signs and symptoms of stroke. The patient should avoid any OTC items containing aspirin or ibuprofen.  Avoid alcohol consumption.   Call if any signs of abnormal bleeding.  Discussed financial obligations and pt states is able to  obtain medication.   Pt states has had no problems in taking Eliquis and is scheduled for cardioversion on Monday October 26th and pt states understanding of Eliquis and how to take medication. Pt instructed will be sent to lab for CBC and BMET and will call with results of HGB and HCT  and SrCr.   Spoke with pt and instructed according to labs weight and age is on the correct dose of Eliquis and to continue this same dose and to have MD follow his Eliquis and have every 6 months check of CBC and BMET and pt states understanding Pt was concerned regarding method of anesthesia regarding cardioversion on Monday instructed would have conscious sedation but to talk with MD on Monday regarding this as he states he felt that the propofol given to him before surgery was the reason he had Atrial Fib and he states will  do so and instructed to have this added to Allergies if indeed is allergic and he states understanding.

## 2014-02-15 NOTE — Patient Instructions (Addendum)
Your physician has recommended that you have a Cardioversion (DCCV). Electrical Cardioversion uses a jolt of electricity to your heart either through paddles or wired patches attached to your chest. This is a controlled, usually prescheduled, procedure. Defibrillation is done under light anesthesia in the hospital, and you usually go home the day of the procedure. This is done to get your heart back into a normal rhythm. You are not awake for the procedure. Please see the instruction sheet given to you today.  YOU WILL NEED A FOLLOW UP WITH DR. VARANASI 2-3 WEEKS AFTER CARDIOVERSION. I WILL HAVE OUR OFFICE  CALL YOU WITH AN APPT.  LAB WORK TODAY BMET, CBC

## 2014-02-15 NOTE — H&P (Signed)
History and Physical   Date:  02/15/2014   ID:  Matthew [name], DOB [birth year], MRN [id number]  PCP:  Elyn Peers, MD  Cardiologist:  Dr. Casandra Doffing     History of Present Illness: Matthew [name] is a 68 y.o. male with a hx of CAD s/p CABG 2011, DM, HL (intol to statins).  He was recently seen by Melina Copa, PA-C 01/25/14 for newly dx AFib.  Eliquis was started by his PCP prior to this visit.  TSH normal.  Metoprolol dose was increased.  Myoview was obtained.  This demonstrated inferolateral scar but no ischemia.  Med Rx was recommended.  Echo demonstrated normal LVF with mild AI/mod MR/mild MS.  He returns for FU with an eye towards DCCV.    He is here with his wife.  He continues to have good days and bad days.  He has occasional dyspnea.  He is overall NYHA 2.  He denies chest pain.  Denies syncope.  He has been dizzy at times.  His BP was low one day last week when he saw his PCP.  He feels bad when he lays down.  He cannot really explain this.  He sleeps on 3 pillows.  He denies PND or significant edema.  No significant weight gain.   Studies:  - LHC (05/2009):  EF 45-50%, inf AK, pRCA 100, mLAD 90, pCFX 75, L>R collats >>>CABG (L-LAD, S-OM, S-PDA)  - Echo (10/15):  Mild focal basal septal hypertrophy, EF 60-65%, no RWMA, mild AI, mean AV 7 mmHg, MAC, mod MR, mild MS (mean 6 mmHg), mild LAE, mild reduced RVSF, mild RAE  - Nuclear (02/05/14):  Intermediate risk; large, severe fixed inf-lat defect c/w LCx scar. EF 57%.  Inf-lat AK   Recent Labs/Images:  Recent Labs  06/21/13 1405 11/03/13 01/25/14 1116  NA  --   --  135  K  --   --  4.2  BUN  --   --  18  CREATININE  --  0.9 1.0  ALT 17  --   --   HGB  --   --  12.6*  TSH  --   --  2.95  LDLCALC 136* 118  --   PROBNP  --   --  122.0*     Wt Readings from Last 3 Encounters:  02/15/14 194 lb (87.998 kg)  02/08/14 190 lb 12.8 oz (86.546 kg)  02/05/14 191 lb (86.637 kg)     Past Medical History  Diagnosis Date    . Diabetes mellitus without complication   . Testicular cancer   . Hyperlipidemia     a. Intolerant of lipitor and vytorin.  . Glaucoma   . Diabetic retinopathy     mild- Dr. Herbert Deaner  . Coronary artery disease     a. s/p CABG 2011. b. Nuc 01/2014: inf-lat scar but no ischemia, EF 57%.  . MI, old     mild LV dysfunction, EF 45-50%, inferior posterior akinesis  . Valvular heart disease     a. Mild AI/MS, mod MR by echo 01/2014.    Current Outpatient Prescriptions  Medication Sig Dispense Refill  . BD INSULIN SYRINGE ULTRAFINE 31G X 5/16" 0.3 ML MISC       . Coenzyme Q10 (CO Q 10) 100 MG CAPS Take 100 mg by mouth 2 (two) times daily.      Marland Kitchen ELIQUIS 5 MG TABS tablet Take 5 mg by mouth 2 (two) times daily. 1 tab twice a  day      . Insulin Glargine (LANTUS) 100 UNIT/ML Solostar Pen Inject 25 Units into the skin daily at 10 pm.      . insulin lispro (HUMALOG KWIKPEN) 100 UNIT/ML KiwkPen Inject 0.1 mLs (10 Units total) into the skin 3 (three) times daily with meals.  15 mL  0  . Insulin Pen Needle (B-D ULTRAFINE III SHORT PEN) 31G X 8 MM MISC Use as directed 4 times per day  130 each  0  . lisinopril (PRINIVIL,ZESTRIL) 5 MG tablet Take 5 mg by mouth daily.      . metoprolol tartrate (LOPRESSOR) 50 MG tablet TAKE 1 TABLET BY MOUTH TWICE DAILY.  60 tablet  11  . VITAMIN D, ERGOCALCIFEROL, PO Take 1 capsule by mouth daily.       No current facility-administered medications for this visit.     Allergies:   Actos; Bee venom; Codeine; Glucophage; and Raspberry   Social History:  The patient  reports that he has quit smoking. He does not have any smokeless tobacco history on file. He reports that he drinks alcohol. He reports that he does not use illicit drugs.   Family History:  The patient's family history includes Diabetes in his father, mother, and sister; Heart attack in his father; Heart disease in his father; Hypertension in his father.   ROS:  Please see the history of present illness.    No bleeding.   All other systems reviewed and negative.    PHYSICAL EXAM: VS:  BP 130/62  Pulse 71  Ht 5\' 6"  (1.676 m)  Wt 194 lb (87.998 kg)  BMI 31.33 kg/m2 Well nourished, well developed, in no acute distress HEENT: normal Neck: no JVD Cardiac:  normal S1, S2; irreg irreg rhythm; no murmur Lungs:  clear to auscultation bilaterally, no wheezing, rhonchi or rales Abd: soft, nontender, no hepatomegaly Ext: no edema Skin: warm and dry Neuro:  CNs 2-12 intact, no focal abnormalities noted  EKG:  AFlutter, HR 71, variable AV block   - L atrial flutter   ASSESSMENT AND PLAN:  Atrial flutter, unspecified -  Reviewed with Dr. [name] (DOD).  He notes that this is a L atrial flutter which means it is usually associated with AFib as well and is not easily treated with ablation.  If he has recurrent AFib/Flutter, he will likely need anti-arrhythmic drug therapy as he has been symptomatic.  He admits to adherence to his anticoagulation Rx.  He has been on Eliquis uninterrupted for 3 weeks.     -  Schedule DCCV to restore NSR.    -  CHADS2-VASc=3 (age, DM, vascular disease).  He will need long-term AC.    -  FU with [facility] today as planned.  He will need a BMET and CBC.   CAD -  Recent myoview low risk.  He does not need ASA as he is on Eliquis.  Continuue beta blocker.  He is intolerant of statins.  Valvular heart disease - mild AI/mod MR/mild MS  -  Reviewed this with Dr. [name] today.  This is not significant enough to be the cause of his AFib/Flutter and he can continue Eliquis.    Pure hypercholesterolemia  -   As noted he is intolerant of statins.  Consider referral to [facility] for consideration of PCSK-9 trial.  Type 2 diabetes mellitus with other circulatory complications  -  Hold Humalog the day of and take 1/2 dose of Lantus the night before  his DCCV.   Disposition:   FU with Dr. Casandra Doffing 2-3 weeks.    Signed, Versie Starks, MHS 02/15/2014 9:20 AM     [facility] [place], [place], [place]  [address] Phone: [phone]; Fax: [phone]

## 2014-02-15 NOTE — Progress Notes (Signed)
Cardiology Office Note   Date:  02/15/2014   ID:  Matthew [name], DOB 10-04-1945, MRN [id number]  PCP:  Elyn Peers, MD  Cardiologist:  Dr. Casandra Doffing     History of Present Illness: Matthew [name] is a 68 y.o. male with a hx of CAD s/p CABG 2011, DM, HL (intol to statins).  He was recently seen by Melina Copa, PA-C 01/25/14 for newly dx AFib.  Eliquis was started by his PCP prior to this visit.  TSH normal.  Metoprolol dose was increased.  Myoview was obtained.  This demonstrated inferolateral scar but no ischemia.  Med Rx was recommended.  Echo demonstrated normal LVF with mild AI/mod MR/mild MS.  He returns for FU with an eye towards DCCV.    He is here with his wife.  He continues to have good days and bad days.  He has occasional dyspnea.  He is overall NYHA 2.  He denies chest pain.  Denies syncope.  He has been dizzy at times.  His BP was low one day last week when he saw his PCP.  He feels bad when he lays down.  He cannot really explain this.  He sleeps on 3 pillows.  He denies PND or significant edema.  No significant weight gain.   Studies:  - LHC (05/2009):  EF 45-50%, inf AK, pRCA 100, mLAD 90, pCFX 75, L>R collats >>>CABG (L-LAD, S-OM, S-PDA)  - Echo (10/15):  Mild focal basal septal hypertrophy, EF 60-65%, no RWMA, mild AI, mean AV 7 mmHg, MAC, mod MR, mild MS (mean 6 mmHg), mild LAE, mild reduced RVSF, mild RAE  - Nuclear (02/05/14):  Intermediate risk; large, severe fixed inf-lat defect c/w LCx scar. EF 57%.  Inf-lat AK   Recent Labs/Images:  Recent Labs  06/21/13 1405 11/03/13 01/25/14 1116  NA  --   --  135  K  --   --  4.2  BUN  --   --  18  CREATININE  --  0.9 1.0  ALT 17  --   --   HGB  --   --  12.6*  TSH  --   --  2.95  LDLCALC 136* 118  --   PROBNP  --   --  122.0*     Wt Readings from Last 3 Encounters:  02/15/14 194 lb (87.998 kg)  02/08/14 190 lb 12.8 oz (86.546 kg)  02/05/14 191 lb (86.637 kg)     Past Medical History  Diagnosis Date   . Diabetes mellitus without complication   . Testicular cancer   . Hyperlipidemia     a. Intolerant of lipitor and vytorin.  . Glaucoma   . Diabetic retinopathy     mild- Dr. Herbert Deaner  . Coronary artery disease     a. s/p CABG 2011. b. Nuc 01/2014: inf-lat scar but no ischemia, EF 57%.  . MI, old     mild LV dysfunction, EF 45-50%, inferior posterior akinesis  . Valvular heart disease     a. Mild AI/MS, mod MR by echo 01/2014.    Current Outpatient Prescriptions  Medication Sig Dispense Refill  . BD INSULIN SYRINGE ULTRAFINE 31G X 5/16" 0.3 ML MISC       . Coenzyme Q10 (CO Q 10) 100 MG CAPS Take 100 mg by mouth 2 (two) times daily.      Marland Kitchen ELIQUIS 5 MG TABS tablet Take 5 mg by mouth 2 (two) times daily. 1 tab twice a day      .  Insulin Glargine (LANTUS) 100 UNIT/ML Solostar Pen Inject 25 Units into the skin daily at 10 pm.      . insulin lispro (HUMALOG KWIKPEN) 100 UNIT/ML KiwkPen Inject 0.1 mLs (10 Units total) into the skin 3 (three) times daily with meals.  15 mL  0  . Insulin Pen Needle (B-D ULTRAFINE III SHORT PEN) 31G X 8 MM MISC Use as directed 4 times per day  130 each  0  . lisinopril (PRINIVIL,ZESTRIL) 5 MG tablet Take 5 mg by mouth daily.      . metoprolol tartrate (LOPRESSOR) 50 MG tablet TAKE 1 TABLET BY MOUTH TWICE DAILY.  60 tablet  11  . VITAMIN D, ERGOCALCIFEROL, PO Take 1 capsule by mouth daily.       No current facility-administered medications for this visit.     Allergies:   Actos; Bee venom; Codeine; Glucophage; and Raspberry   Social History:  The patient  reports that he has quit smoking. He does not have any smokeless tobacco history on file. He reports that he drinks alcohol. He reports that he does not use illicit drugs.   Family History:  The patient's family history includes Diabetes in his father, mother, and sister; Heart attack in his father; Heart disease in his father; Hypertension in his father.   ROS:  Please see the history of present illness.    No bleeding.   All other systems reviewed and negative.    PHYSICAL EXAM: VS:  BP 130/62  Pulse 71  Ht 5\' 6"  (1.676 m)  Wt 194 lb (87.998 kg)  BMI 31.33 kg/m2 Well nourished, well developed, in no acute distress HEENT: normal Neck: no JVD Cardiac:  normal S1, S2; irreg irreg rhythm; no murmur Lungs:  clear to auscultation bilaterally, no wheezing, rhonchi or rales Abd: soft, nontender, no hepatomegaly Ext: no edema Skin: warm and dry Neuro:  CNs 2-12 intact, no focal abnormalities noted  EKG:  AFlutter, HR 71, variable AV block   - L atrial flutter   ASSESSMENT AND PLAN:  Atrial flutter, unspecified -  Reviewed with Dr. [name] (DOD).  He notes that this is a L atrial flutter which means it is usually associated with AFib as well and is not easily treated with ablation.  If he has recurrent AFib/Flutter, he will likely need anti-arrhythmic drug therapy as he has been symptomatic.  He admits to adherence to his anticoagulation Rx.  He has been on Eliquis uninterrupted for 3 weeks.     -  Schedule DCCV to restore NSR.    -  CHADS2-VASc=3 (age, DM, vascular disease).  He will need long-term AC.    -  FU with [facility] clinic today as planned.  He will need a BMET and CBC.   CAD -  Recent myoview low risk.  He does not need ASA as he is on Eliquis.  Continuue beta blocker.  He is intolerant of statins.  Valvular heart disease - mild AI/mod MR/mild MS  -  Reviewed this with Dr. [name] today.  This is not significant enough to be the cause of his AFib/Flutter and he can continue Eliquis.    Pure hypercholesterolemia  -   As noted he is intolerant of statins.  Consider referral to [facility] for consideration of PCSK-9 trial.  Type 2 diabetes mellitus with other circulatory complications  -  Hold Humalog the day of and take 1/2 dose of Lantus the night before his DCCV.   Disposition:  FU with Dr. Casandra Doffing 2-3 weeks.    Signed, Versie Starks, MHS 02/15/2014 9:20 AM     [facility] [address], [place], [place]  [address] Phone: [phone]; Fax: [phone]

## 2014-02-15 NOTE — Telephone Encounter (Signed)
pt notified about lab results ok for DCCV. Pt verbalized understanding to results given today.

## 2014-02-16 ENCOUNTER — Encounter (HOSPITAL_COMMUNITY): Payer: Self-pay | Admitting: Pharmacy Technician

## 2014-02-19 ENCOUNTER — Encounter (HOSPITAL_COMMUNITY)
Admission: RE | Disposition: A | Discharge status: Home / Self Care | Payer: Self-pay | Source: Ambulatory Visit | Attending: Cardiovascular Disease

## 2014-02-19 ENCOUNTER — Encounter (HOSPITAL_COMMUNITY): Payer: Self-pay

## 2014-02-19 ENCOUNTER — Ambulatory Visit (HOSPITAL_COMMUNITY): Payer: Medicare HMO | Admitting: Anesthesiology

## 2014-02-19 ENCOUNTER — Encounter (HOSPITAL_COMMUNITY): Payer: Medicare HMO | Admitting: Anesthesiology

## 2014-02-19 ENCOUNTER — Ambulatory Visit (HOSPITAL_COMMUNITY)
Admission: RE | Admit: 2014-02-19 | Discharge: 2014-02-19 | Disposition: A | Discharge status: Home / Self Care | Payer: Medicare HMO | Source: Ambulatory Visit | Attending: Cardiovascular Disease | Admitting: Cardiovascular Disease

## 2014-02-19 DIAGNOSIS — I2510 Atherosclerotic heart disease of native coronary artery without angina pectoris: Secondary | ICD-10-CM | POA: Diagnosis not present

## 2014-02-19 DIAGNOSIS — I080 Rheumatic disorders of both mitral and aortic valves: Secondary | ICD-10-CM | POA: Diagnosis not present

## 2014-02-19 DIAGNOSIS — I4891 Unspecified atrial fibrillation: Secondary | ICD-10-CM | POA: Diagnosis not present

## 2014-02-19 DIAGNOSIS — E11319 Type 2 diabetes mellitus with unspecified diabetic retinopathy without macular edema: Secondary | ICD-10-CM | POA: Diagnosis not present

## 2014-02-19 DIAGNOSIS — E780 Pure hypercholesterolemia: Secondary | ICD-10-CM | POA: Insufficient documentation

## 2014-02-19 DIAGNOSIS — Z8547 Personal history of malignant neoplasm of testis: Secondary | ICD-10-CM | POA: Insufficient documentation

## 2014-02-19 DIAGNOSIS — I4892 Unspecified atrial flutter: Secondary | ICD-10-CM | POA: Insufficient documentation

## 2014-02-19 DIAGNOSIS — Z951 Presence of aortocoronary bypass graft: Secondary | ICD-10-CM | POA: Insufficient documentation

## 2014-02-19 DIAGNOSIS — Z794 Long term (current) use of insulin: Secondary | ICD-10-CM | POA: Insufficient documentation

## 2014-02-19 DIAGNOSIS — I252 Old myocardial infarction: Secondary | ICD-10-CM | POA: Insufficient documentation

## 2014-02-19 HISTORY — PX: CARDIOVERSION: SHX1299

## 2014-02-19 LAB — GLUCOSE, CAPILLARY: Glucose-Capillary: 182 mg/dL — ABNORMAL HIGH (ref 70–99)

## 2014-02-19 SURGERY — CARDIOVERSION
Anesthesia: General

## 2014-02-19 MED ORDER — LACTATED RINGERS IV SOLN
INTRAVENOUS | Status: DC
Start: 2014-02-19 — End: 2014-02-19
  Administered 2014-02-19: 10:00:00 via INTRAVENOUS

## 2014-02-19 MED ORDER — LIDOCAINE HCL (CARDIAC) 20 MG/ML IV SOLN
INTRAVENOUS | Status: DC | PRN
  Administered 2014-02-19: 70 mg via INTRAVENOUS

## 2014-02-19 MED ORDER — LACTATED RINGERS IV SOLN
INTRAVENOUS | Status: DC | PRN
  Administered 2014-02-19: 11:00:00 via INTRAVENOUS

## 2014-02-19 MED ORDER — PROPOFOL 10 MG/ML IV BOLUS
INTRAVENOUS | Status: DC | PRN
  Administered 2014-02-19: 100 mg via INTRAVENOUS

## 2014-02-19 NOTE — Anesthesia Preprocedure Evaluation (Addendum)
Anesthesia Evaluation  Patient identified by MRN, date of birth, ID band  Reviewed: Allergy & Precautions, H&P , NPO status , Patient's Chart, lab work & pertinent test results  Airway Mallampati: I  TM Distance: >3 FB Neck ROM: Full    Dental  (+) Teeth Intact, Dental Advisory Given   Pulmonary former smoker,          Cardiovascular + CAD, + Past MI and + CABG + dysrhythmias Atrial Fibrillation     Neuro/Psych    GI/Hepatic   Endo/Other  diabetes  Renal/GU      Musculoskeletal   Abdominal   Peds  Hematology   Anesthesia Other Findings   Reproductive/Obstetrics                            Anesthesia Physical Anesthesia Plan  ASA: III  Anesthesia Plan: General   Post-op Pain Management:    Induction: Intravenous  Airway Management Planned: Mask  Additional Equipment:   Intra-op Plan:   Post-operative Plan:   Informed Consent: I have reviewed the patients History and Physical, chart, labs and discussed the procedure including the risks, benefits and alternatives for the proposed anesthesia with the patient or authorized representative who has indicated his/her understanding and acceptance.     Plan Discussed with: CRNA, Anesthesiologist and Surgeon  Anesthesia Plan Comments:         Anesthesia Quick Evaluation

## 2014-02-19 NOTE — H&P (Signed)
Indicted very nauseous absence, I will perform cardioversion for atrial flutter. Patient is being limited from the standpoint of exertional tolerance. He is been adequately anticoagulated. His rhythm is stable.

## 2014-02-19 NOTE — Anesthesia Postprocedure Evaluation (Signed)
  Anesthesia Post-op Note  Patient: Matthew [name]  Procedure(s) Performed: Procedure(s): CARDIOVERSION (N/A)  Patient Location: PACU  Anesthesia Type: General   Level of Consciousness: awake, alert  and oriented  Airway and Oxygen Therapy: Patient Spontanous Breathing  Post-op Pain: none  Post-op Assessment: Post-op Vital signs reviewed  Post-op Vital Signs: Reviewed  Last Vitals:  Filed Vitals:   02/19/14 1200  BP: 132/61  Pulse: 77  Temp: 36.7 C  Resp: 19    Complications: No apparent anesthesia complications

## 2014-02-19 NOTE — Transfer of Care (Signed)
Immediate Anesthesia Transfer of Care Note  Patient: Matthew [name]  Procedure(s) Performed: Procedure(s): CARDIOVERSION (N/A)  Patient Location: Endoscopy Unit  Anesthesia Type:MAC  Level of Consciousness: sedated  Airway & Oxygen Therapy: Patient Spontanous Breathing and Patient connected to nasal cannula oxygen  Post-op Assessment: Report given to PACU RN and Post -op Vital signs reviewed and stable  Post vital signs: Reviewed and stable  Complications: No apparent anesthesia complications

## 2014-02-19 NOTE — CV Procedure (Signed)
Electrical Cardioversion Procedure Note Matthew [name] [id number] 1945/07/20  Procedure: Electrical Cardioversion Indications:  Atrial Flutter  Time Out: Verified patient identification, verified procedure,medications/allergies/relevent history reviewed, required imaging and test results available.  Performed  Procedure Details  The patient was NPO after midnight. Anesthesia was administered at the beside  by Dr.Crews with 80mg  of propofol.  Cardioversion was done with synchronized biphasic defibrillation with AP pads with 120 watts.  The patient converted to normal sinus rhythm. The patient tolerated the procedure well   IMPRESSION:  Successful cardioversion of atrial flutter.    [name] 02/19/2014, 11:39 AM

## 2014-02-20 ENCOUNTER — Encounter (HOSPITAL_COMMUNITY): Payer: Self-pay | Admitting: Interventional Cardiology

## 2014-03-09 ENCOUNTER — Other Ambulatory Visit: Payer: Self-pay | Admitting: *Deleted

## 2014-03-09 MED ORDER — INSULIN LISPRO 100 UNIT/ML (KWIKPEN): 8.0000 [IU] | PEN_INJECTOR | Freq: Three times a day (TID) | SUBCUTANEOUS | Status: DC

## 2014-03-16 ENCOUNTER — Encounter: Payer: Self-pay | Admitting: Endocrinology

## 2014-03-16 ENCOUNTER — Ambulatory Visit (INDEPENDENT_AMBULATORY_CARE_PROVIDER_SITE_OTHER): Payer: Medicare HMO | Admitting: Endocrinology

## 2014-03-16 VITALS — BP 126/80 | HR 100 | Temp 98.2°F | Resp 14 | Ht 66.0 in | Wt 193.0 lb

## 2014-03-16 DIAGNOSIS — IMO0002 Reserved for concepts with insufficient information to code with codable children: Secondary | ICD-10-CM

## 2014-03-16 DIAGNOSIS — E1165 Type 2 diabetes mellitus with hyperglycemia: Secondary | ICD-10-CM

## 2014-03-16 LAB — COMPREHENSIVE METABOLIC PANEL
ALT: 17 U/L (ref 0–53)
AST: 23 U/L (ref 0–37)
Albumin: 3.7 g/dL (ref 3.5–5.2)
Alkaline Phosphatase: 77 U/L (ref 39–117)
BUN: 17 mg/dL (ref 6–23)
CO2: 28 mEq/L (ref 19–32)
Calcium: 9.1 mg/dL (ref 8.4–10.5)
Chloride: 101 mEq/L (ref 96–112)
Creatinine, Ser: 1.1 mg/dL (ref 0.4–1.5)
GFR: 72.73 mL/min (ref >60.00)
Glucose, Bld: 269 mg/dL — ABNORMAL HIGH (ref 70–99)
Potassium: 4.5 mEq/L (ref 3.5–5.1)
Sodium: 135 mEq/L (ref 135–145)
Total Bilirubin: 0.8 mg/dL (ref 0.2–1.2)
Total Protein: 7.1 g/dL (ref 6.0–8.3)

## 2014-03-16 LAB — MICROALBUMIN / CREATININE URINE RATIO
Creatinine,U: 107 mg/dL
Microalb Creat Ratio: 2.3 mg/g (ref 0.0–30.0)
Microalb, Ur: 2.5 mg/dL — ABNORMAL HIGH (ref 0.0–1.9)

## 2014-03-16 LAB — HEMOGLOBIN A1C: Hgb A1c MFr Bld: 9.3 % — ABNORMAL HIGH (ref 4.6–6.5)

## 2014-03-16 MED ORDER — INSULIN GLARGINE 300 UNIT/ML ~~LOC~~ SOPN: 30.0000 [IU] | PEN_INJECTOR | Freq: Every day | SUBCUTANEOUS | Status: DC

## 2014-03-16 NOTE — Progress Notes (Signed)
Patient ID: Matthew [name], male   DOB: 13-Feb-1946, 68 y.o.   MRN: [id number]           Reason for Appointment: Followup for Type 2 Diabetes  Referring physician: Criss Rosales  History of Present Illness:          Diagnosis: Type 2 diabetes mellitus, date of diagnosis:  1992      Past history: His blood sugar was high at diagnosis when he was having a routine screening done. He was started on Glucophage initially which he took for about a year. He thinks it did not help his sugar much and he did not feel good with it Apparently he was trying to control his diabetes with diet and exercise for a few years. He may have tried Glucophage again before going on insulin. Also was started on Actos which caused swelling He has been treated mostly with premixed insulin for about 15 years and his level of control appears to be inadequate although details of previous treatment are not available. He may have taken Humalog at one time for use with high sugars A1c was 11.5 in 2011  Recent history: On his initial consultation in 9/15 because of poor control with premixed insulin he was switched to Levemir twice a day and Humalog with meals. However because of cost he is now taking Lantus instead of Levemir at night. However with taking both Lantus and mealtime insulin consistently his fasting blood sugars are significantly better, previously had been irregular with insulin doses No recent A1c available Problems identified:  His fasting blood sugars have been quite variable  He says he is adjusting his Lantus at night based on how much is eating  Despite instructions he has not checked blood sugars after meals  He is not adjusting his mealtime insulin based on how much he is eating and has arbitrarily increased his dose by 2-4 units at all meals  Is tending to have low blood sugars periodically in the afternoon either from smaller portions at lunch or increased activity Recently has been little irregular  with his exercise because of intercurrent illness Hypoglycemia: Has documented 1-2 low blood sugars at 4 PM       Oral hypoglycemic drugs the patient is taking are: None      Side effects from medications have been: Edema from Actos,? Nausea and malaise from metformin INSULIN regimen is described as: previously Lantus 22-25, Humalog 10-12 units 3 times a day with meals  Compliance with the medical regimen: Fair  With hypoglycemia he has symptoms of weakness, sweating, shakiness and confusion OJ for Rx  Glucose monitoring:  done one time a day         Glucometer: One Touch.      Blood Glucose readings by download  PREMEAL Breakfast Lunch Dinner Bedtime Overall  Glucose range:  63-273    59, 66     Mean/median: 162     152     Self-care: The diet that the patient has been following is: tries to limit high-fat meals      Meals: 3 meals per day.      Exercise: Racquetball about 3-4 days a week mostly in morning before breakfast , sometimes late afternoon, biking, swimming at times            Dietician visit, most recent: 15 years ago.               Weight history:  Wt Readings from Last 3 Encounters:  03/16/14 193 lb (87.544 kg)  02/15/14 194 lb (87.998 kg)  02/08/14 190 lb 12.8 oz (86.546 kg)    Glycemic control:   Lab Results  Component Value Date   HGBA1C 9.3* 03/16/2014   HGBA1C 9.7* 11/03/2013   HGBA1C * 06/18/2009    11.5 (NOTE) The ADA recommends the following therapeutic goal for glycemic control related to Hgb A1c measurement: Goal of therapy: <6.5 Hgb A1c  Reference: American Diabetes Association: Clinical Practice Recommendations 2010, Diabetes Care, 2010, 33: (Suppl  1).   Lab Results  Component Value Date   MICROALBUR 2.5* 03/16/2014   LDLCALC 118 11/03/2013   CREATININE 1.1 03/16/2014         Medication List       This list is accurate as of: 03/16/14 11:59 PM.  Always use your most recent med list.               B-D ULTRAFINE III SHORT PEN 31G  X 8 MM Misc  Generic drug:  Insulin Pen Needle     BD INSULIN SYRINGE ULTRAFINE 31G X 5/16" 0.3 ML Misc  Generic drug:  Insulin Syringe-Needle U-100     bimatoprost 0.01 % Soln  Commonly known as:  LUMIGAN  Place 1 drop into the left eye at bedtime.     cholecalciferol 1000 UNITS tablet  Commonly known as:  VITAMIN D  Take 5,000 Units by mouth daily.     Co Q 10 100 MG Caps  Take 100 mg by mouth 2 (two) times daily.     ELIQUIS 5 MG Tabs tablet  Generic drug:  apixaban  Take 5 mg by mouth 2 (two) times daily.     Insulin Glargine 300 UNIT/ML Sopn  Commonly known as:  TOUJEO SOLOSTAR  Inject 30 Units into the skin daily.     insulin lispro 100 UNIT/ML KiwkPen  Commonly known as:  HUMALOG KWIKPEN  Inject 0.08-0.12 mLs (8-12 Units total) into the skin 3 (three) times daily.     lisinopril 5 MG tablet  Commonly known as:  PRINIVIL,ZESTRIL  Take 5 mg by mouth daily.     metoprolol 50 MG tablet  Commonly known as:  LOPRESSOR  Take 50 mg by mouth 2 (two) times daily. Per sliding scale     ONETOUCH DELICA LANCETS 00F Misc     ONETOUCH VERIO test strip  Generic drug:  glucose blood        Allergies:  Allergies  Allergen Reactions  . Actos [Pioglitazone] Swelling  . Bee Venom Swelling  . Codeine Nausea Only    REACTION: hives  . Glucophage [Metformin Hcl] Nausea Only  . Raspberry Itching    Past Medical History  Diagnosis Date  . Diabetes mellitus without complication   . Testicular cancer   . Hyperlipidemia     a. Intolerant of lipitor and vytorin.  . Glaucoma   . Diabetic retinopathy     mild- Dr. Herbert Deaner  . Coronary artery disease     a. s/p CABG 2011. b. Nuc 01/2014: inf-lat scar but no ischemia, EF 57%.  . MI, old     mild LV dysfunction, EF 45-50%, inferior posterior akinesis  . Valvular heart disease     a. Mild AI/MS, mod MR by echo 01/2014.  Marland Kitchen Shortness of breath     Past Surgical History  Procedure Laterality Date  . Cardiac bypass    .  Hernia repair    . Elbow surgery    . Cardiac  catheterization      2011  . Coronary artery bypass graft      LIMA-LAD, SVG-OM, SVG-PDA 06/20/09  . Cardioversion N/A 02/19/2014    Procedure: CARDIOVERSION;  Surgeon: [name], MD;  Location: [facility] ENDOSCOPY;  Service: Cardiovascular;  Laterality: N/A;    Family History  Problem Relation Age of Onset  . Heart disease Father   . Heart attack Father   . Diabetes Mother   . Diabetes Father   . Diabetes Sister   . Hypertension Father     Social History:  reports that he has quit smoking. He does not have any smokeless tobacco history on file. He reports that he drinks alcohol. He reports that he does not use illicit drugs.    Review of Systems       Vision is normal. Most recent eye exam was 2014. Has had glaucoma        Lipids: He was previously on Vytorin and Lipitor. He thinks he had myalgias and weakness when taking these drugs. He has been recommended statin drugs in the lipid clinic but he refuses to take them stating that they will not help him.  Also has significantly increased LDL particle number  Lab Results  Component Value Date   LDLCALC 118 11/03/2013   TRIG 132 06/21/2013    He has been recently treated by cardiologist for his atrial fibrillation and is feeling much better  Physical Examination:  BP 126/80 mmHg  Pulse 100  Temp(Src) 98.2 F (36.8 C)  Resp 14  Ht 5\' 6"  (1.676 m)  Wt 193 lb (87.544 kg)  BMI 31.17 kg/m2  SpO2 97%  No pedal edema  ASSESSMENT:  Diabetes type 2, uncontrolled  He has had long-standing diabetes on insulin for about 15 years with usually poor control See history of present illness for details of his current management of problems identified in blood sugar patterns With taking a higher dose of Lantus and continuing Humalog at mealtimes his blood sugars appear to be improving As discussed above he has still multiple issues with not understanding how to dose his insulin  especially with adjustment of Lantus Has not checked any readings after meals as discussed He has been recommended diabetes teaching with nurse educator but he refuses  PLAN:   Lantus to be adjusted based on fasting blood sugar trend for 3 days as discussed on previous visit, he will use the flowsheet provided.  Discussed blood sugar targets   he will probably need a couple of units more on the Toujeo insulin which he will start when his Lantus runs out  Reduce Humalog on the mornings he is exercising by 4-5 units at least and check blood sugars after exercise  Most likely needs at least 2 units less Humalog before lunch because of tendency to low sugars in the afternoons  Start monitoring consistently after lunch and dinner, this will help him adjust his supper time coverage also  A1c to be rechecked today  Needs regular eye exams  Patient Instructions  Please check blood sugars at least half the time about 2 hours after any meal and 4-6 times per week on waking up. Please bring blood sugar monitor to each visit  Adjust Lantus ONLY based on sugar trend on waking   Counseling time over 50% of today's 25 minute visit  KUMAR,AJAY 03/18/2014, 10:03 AM   Note: This office note was prepared with Estate agent. Any transcriptional errors that result from this  process are unintentional.  Addendum: Labs as follows  A1c still high, microalbumin normal  Office Visit on 03/16/2014  Component Date Value Ref Range Status  . Hgb A1c MFr Bld 03/16/2014 9.3* 4.6 - 6.5 % Final   Glycemic Control Guidelines for People with Diabetes:Non Diabetic:  <6%Goal of Therapy: <7%Additional Action Suggested:  >8%   . Sodium 03/16/2014 135  135 - 145 mEq/L Final  . Potassium 03/16/2014 4.5  3.5 - 5.1 mEq/L Final  . Chloride 03/16/2014 101  96 - 112 mEq/L Final  . CO2 03/16/2014 28  19 - 32 mEq/L Final  . Glucose, Bld 03/16/2014 269* 70 - 99 mg/dL Final  . BUN 03/16/2014  17  6 - 23 mg/dL Final  . Creatinine, Ser 03/16/2014 1.1  0.4 - 1.5 mg/dL Final  . Total Bilirubin 03/16/2014 0.8  0.2 - 1.2 mg/dL Final  . Alkaline Phosphatase 03/16/2014 77  39 - 117 U/L Final  . AST 03/16/2014 23  0 - 37 U/L Final  . ALT 03/16/2014 17  0 - 53 U/L Final  . Total Protein 03/16/2014 7.1  6.0 - 8.3 g/dL Final  . Albumin 03/16/2014 3.7  3.5 - 5.2 g/dL Final  . Calcium 03/16/2014 9.1  8.4 - 10.5 mg/dL Final  . GFR 03/16/2014 72.73  >60.00 mL/min Final  . Microalb, Ur 03/16/2014 2.5* 0.0 - 1.9 mg/dL Final  . Creatinine,U 03/16/2014 107.0   Final  . Microalb Creat Ratio 03/16/2014 2.3  0.0 - 30.0 mg/g Final

## 2014-03-16 NOTE — Patient Instructions (Addendum)
Please check blood sugars at least half the time about 2 hours after any meal and 4-6 times per week on waking up. Please bring blood sugar monitor to each visit  Adjust Lantus ONLY based on sugar trend on waking

## 2014-03-18 NOTE — Progress Notes (Signed)
Quick Note:  Please let patient know that the A1c is 9.3, not much better, glucose 269 after breakfast Kidneys okay  ______

## 2014-03-20 ENCOUNTER — Ambulatory Visit (INDEPENDENT_AMBULATORY_CARE_PROVIDER_SITE_OTHER): Payer: Medicare HMO | Admitting: Interventional Cardiology

## 2014-03-20 ENCOUNTER — Encounter: Payer: Self-pay | Admitting: Interventional Cardiology

## 2014-03-20 VITALS — BP 138/74 | HR 70 | Ht 66.0 in | Wt 197.8 lb

## 2014-03-20 DIAGNOSIS — E785 Hyperlipidemia, unspecified: Secondary | ICD-10-CM

## 2014-03-20 DIAGNOSIS — I2510 Atherosclerotic heart disease of native coronary artery without angina pectoris: Secondary | ICD-10-CM

## 2014-03-20 DIAGNOSIS — I38 Endocarditis, valve unspecified: Secondary | ICD-10-CM

## 2014-03-20 DIAGNOSIS — I4891 Unspecified atrial fibrillation: Secondary | ICD-10-CM

## 2014-03-20 DIAGNOSIS — I252 Old myocardial infarction: Secondary | ICD-10-CM

## 2014-03-20 NOTE — Progress Notes (Signed)
Patient ID: Matthew [name], male   DOB: Jun 10, 1945, 68 y.o.   MRN: [id number] Patient ID: Matthew [name], male   DOB: [birth year], 68 y.o.   MRN: [id number]    [place], [place], [place]  [address] Phone: [phone] Fax:  [phone]  Date:  03/20/2014   ID:  Matthew [name], DOB 16-Apr-1946, MRN [id number]  PCP:  Elyn Peers, MD      History of Present Illness: Matthew [name] is a 68 y.o. male who has had CABG in 05/2009- no angina at that time. He is becoming more active since his cardioversion of atrial flutter in 10/15.  He felt his arrhythmia.  Exercise tolerance was severely decreased.  He had some LE extremity edema with cough and orthopnea.   He walked regularly and played racquetball with no chest pain.   He is getting back to exercise. He has had rare chest discomfort that lasts 1-2 minutes. He has not used NTG. CAD/ASCVD:  Denies : Diaphoresis.  Diet.  Dizziness.  Dyspnea on exertion.  Fatigue.  Leg edema.  Nitroglycerin.  Orthopnea.  Palpitations.  Syncope.     Wt Readings from Last 3 Encounters:  03/20/14 197 lb 12.8 oz (89.721 kg)  03/16/14 193 lb (87.544 kg)  02/15/14 194 lb (87.998 kg)     Past Medical History  Diagnosis Date  . Diabetes mellitus without complication   . Testicular cancer   . Hyperlipidemia     a. Intolerant of lipitor and vytorin.  . Glaucoma   . Diabetic retinopathy     mild- Dr. Herbert Deaner  . Coronary artery disease     a. s/p CABG 2011. b. Nuc 01/2014: inf-lat scar but no ischemia, EF 57%.  . MI, old     mild LV dysfunction, EF 45-50%, inferior posterior akinesis  . Valvular heart disease     a. Mild AI/MS, mod MR by echo 01/2014.  Marland Kitchen Shortness of breath     Current Outpatient Prescriptions  Medication Sig Dispense Refill  . B-D ULTRAFINE III SHORT PEN 31G X 8 MM MISC   0  . BD INSULIN SYRINGE ULTRAFINE 31G X 5/16" 0.3 ML MISC   0  . bimatoprost (LUMIGAN) 0.01 % SOLN Place 1 drop into the left eye at  bedtime.    . cholecalciferol (VITAMIN D) 1000 UNITS tablet Take 5,000 Units by mouth daily.    . Coenzyme Q10 (CO Q 10) 100 MG CAPS Take 100 mg by mouth 2 (two) times daily.    Marland Kitchen ELIQUIS 5 MG TABS tablet Take 5 mg by mouth 2 (two) times daily.     . Insulin Glargine (TOUJEO SOLOSTAR) 300 UNIT/ML SOPN Inject 30 Units into the skin daily. (Patient taking differently: Inject 22-30 Units into the skin at bedtime. ) 3 pen 3  . insulin lispro (HUMALOG KWIKPEN) 100 UNIT/ML KiwkPen Inject 0.08-0.12 mLs (8-12 Units total) into the skin 3 (three) times daily. (Patient taking differently: Inject 8-12 Units into the skin 3 (three) times daily. Per sliding scale) 15 mL 3  . lisinopril (PRINIVIL,ZESTRIL) 5 MG tablet Take 5 mg by mouth daily.    . metoprolol (LOPRESSOR) 50 MG tablet Take 50 mg by mouth 2 (two) times daily.     Glory Rosebush DELICA LANCETS 63W MISC     . ONETOUCH VERIO test strip      No current facility-administered medications for this visit.    Allergies:    Allergies  Allergen Reactions  .  Actos [Pioglitazone] Swelling  . Bee Venom Swelling  . Codeine Nausea Only    REACTION: hives  . Glucophage [Metformin Hcl] Nausea Only  . Raspberry Itching    Social History:  The patient  reports that he has quit smoking. He does not have any smokeless tobacco history on file. He reports that he drinks alcohol. He reports that he does not use illicit drugs.   Family History:  The patient's family history includes Diabetes in his father, mother, and sister; Heart attack in his father; Heart disease in his father; Hypertension in his father.   ROS:  Please see the history of present illness.  No nausea, vomiting.  No fevers, chills.  No focal weakness.  No dysuria. Feels poorly when taking a statin.  Shortlived left sided chest pains, lasting 1-2 seconds, not related to exertion.  All other systems reviewed and negative.   PHYSICAL EXAM: VS:  BP 138/74 mmHg  Pulse 70  Ht 5\' 6"  (1.676 m)  Wt  197 lb 12.8 oz (89.721 kg)  BMI 31.94 kg/m2 Well nourished, well developed, in no acute distress HEENT: normal Neck: no JVD, no carotid bruits Cardiac:  normal S1, S2; RRR;  Lungs:  clear to auscultation bilaterally, no wheezing, rhonchi or rales Abd: soft, nontender, no hepatomegaly Ext: no edema Skin: warm and dry Neuro:   no focal abnormalities noted  EKG:  Normal sinus rhythm, prolonged PR interval, inferior Q waves, no significant ST segment changes    ASSESSMENT AND PLAN:  Coronary atherosclerosis of native coronary artery  Continue Aspirin Tablet, 325 MG, 1 tablet, Orally, Once a day  Notes: No significant exertional angina. Pain he describes is atypical and not ischemia related. CONtinue aggresive secondary prevention.  2. Myocardial Infarction, Old  Continue Metoprolol Tartrate Tablet, 50 MG, take one (1) tablet(s) twice daily Notes: No CHF.  EF 45-50% in the past.  60-65% at most recent check.  3. Pure hypercholesterolemia  Stopped Vytorin Tablet, 10-40 MG, 1 tablet, Orally, Once a day, atorvastatin-both due to fatigue Notes: LDL 63. Most recent LDL 132 in 2/15.  LDL 118 in 7/15.  Intolerant of several statins. Will discuss with Pharm.D. regarding trial for PCSK-9 inhibitor.  4. AFib/ Atrial flutter: In NSR post cardioversion. COntinue Eliquis.  Let us know if sx return.    Signed, Mina Marble, MD, [facility] 03/20/2014 8:16 AM

## 2014-03-20 NOTE — Patient Instructions (Signed)
Your physician recommends that you continue on your current medications as directed. Please refer to the Current Medication list given to you today. Your physician wants you to follow-up in: 6 months with Dr. Irish Lack.   You will receive a reminder letter in the mail two months in advance. If you don't receive a letter, please call our office to schedule the follow-up appointment. You have been referred to the cardiovascular risk reduction clinic [facility]) to discuss cholesterol lowering medications.

## 2014-05-22 ENCOUNTER — Other Ambulatory Visit: Payer: Self-pay | Admitting: *Deleted

## 2014-05-22 MED ORDER — INSULIN GLARGINE 300 UNIT/ML ~~LOC~~ SOPN: 30.0000 [IU] | PEN_INJECTOR | Freq: Every day | SUBCUTANEOUS | Status: DC

## 2014-06-13 ENCOUNTER — Other Ambulatory Visit (INDEPENDENT_AMBULATORY_CARE_PROVIDER_SITE_OTHER): Payer: Medicare HMO

## 2014-06-13 ENCOUNTER — Other Ambulatory Visit: Payer: Medicare HMO

## 2014-06-13 DIAGNOSIS — E1165 Type 2 diabetes mellitus with hyperglycemia: Secondary | ICD-10-CM

## 2014-06-13 DIAGNOSIS — IMO0002 Reserved for concepts with insufficient information to code with codable children: Secondary | ICD-10-CM

## 2014-06-13 LAB — BASIC METABOLIC PANEL
BUN: 18 mg/dL (ref 6–23)
CO2: 29 mEq/L (ref 19–32)
Calcium: 9.4 mg/dL (ref 8.4–10.5)
Chloride: 101 mEq/L (ref 96–112)
Creatinine, Ser: 1.07 mg/dL (ref 0.40–1.50)
GFR: 73.46 mL/min (ref >60.00)
Glucose, Bld: 109 mg/dL — ABNORMAL HIGH (ref 70–99)
Potassium: 4.7 mEq/L (ref 3.5–5.1)
Sodium: 136 mEq/L (ref 135–145)

## 2014-06-13 LAB — HEMOGLOBIN A1C: Hgb A1c MFr Bld: 10.9 % — ABNORMAL HIGH (ref 4.6–6.5)

## 2014-06-18 ENCOUNTER — Encounter: Payer: Self-pay | Admitting: Endocrinology

## 2014-06-18 ENCOUNTER — Ambulatory Visit (INDEPENDENT_AMBULATORY_CARE_PROVIDER_SITE_OTHER): Payer: Medicare HMO | Admitting: Endocrinology

## 2014-06-18 ENCOUNTER — Other Ambulatory Visit: Payer: Self-pay | Admitting: *Deleted

## 2014-06-18 VITALS — BP 126/73 | HR 72 | Temp 98.1°F | Resp 14 | Ht 66.0 in | Wt 195.6 lb

## 2014-06-18 DIAGNOSIS — IMO0002 Reserved for concepts with insufficient information to code with codable children: Secondary | ICD-10-CM

## 2014-06-18 DIAGNOSIS — E785 Hyperlipidemia, unspecified: Secondary | ICD-10-CM

## 2014-06-18 DIAGNOSIS — E1165 Type 2 diabetes mellitus with hyperglycemia: Secondary | ICD-10-CM

## 2014-06-18 MED ORDER — DULAGLUTIDE 0.75 MG/0.5ML ~~LOC~~ SOAJ
SUBCUTANEOUS | Status: DC
Start: 2014-06-18 — End: 2014-07-16

## 2014-06-18 NOTE — Patient Instructions (Addendum)
Take sugars after meals and target them <379  Start TRULICITYwith the pen as shown once weekly on the same day of the week.  You may inject in the stomach, thigh or arm as indicated in the brochure given.  You will feel fullness of the stomach with starting the medication and should try to keep the portions at meals small.  You may experience nausea in the first few days which usually gets better over time   If any questions or concerns are present call the office or the  [place] at [phone]. Also visit [email or web address] website for more useful information  Toujeo 26 units daily adjust every 3-4 days and target am sugar <140  Check on Repatha for lipids  Please check blood sugars at least half the time about 2 hours after any meal and 3 times per week on waking up. Please bring blood sugar monitor to each visit. Recommended blood sugar levels about 2 hours after meal is 140-180 and on waking up 90-130

## 2014-06-18 NOTE — Progress Notes (Signed)
Patient ID: Matthew [name], male   DOB: 02-Apr-1946, 69 y.o.   MRN: [id number]           Reason for Appointment: Followup for Type 2 Diabetes  Referring physician: Criss Rosales  History of Present Illness:          Diagnosis: Type 2 diabetes mellitus, date of diagnosis:  1992      Past history: His blood sugar was high at diagnosis when he was having a routine screening done. He was started on Glucophage initially which he took for about a year. He thinks it did not help his sugar much and he did not feel good with it Apparently he was trying to control his diabetes with diet and exercise for a few years. He may have tried Glucophage again before going on insulin. Also was started on Actos which caused swelling He has been treated mostly with premixed insulin for about 15 years and his level of control appears to be inadequate although details of previous treatment are not available. He may have taken Humalog at one time for use with high sugars A1c was 11.5 in 2011  Recent history: On his initial consultation in 9/15 because of poor control with premixed insulin he was switched to Levemir twice a day and Humalog with meals. However because of cost he has been taking Lantus instead of Levemir, once a day only; he had somewhat better fasting readings with this He was supposed to switch to [facility] but he still has not done so However A1c has gone up significantly   Problems identified and blood sugar patterns:  His fasting blood sugars have been overall relatively higher and he does not know why.  Although he is taking about the same amount of Lantus he has not adjusted this based on fasting readings; he thinks his sugars may be high because of eating late at night.  He does not check his blood sugars after supper again despite reminders.  He thinks his blood sugar may be a little higher in the afternoon but he does not check these consistently  He is taking about the same amount of Humalog  regardless of exercise or type of meal.  He may not adjust his evening Humalog even when he is having some alcohol like beer  Appears to be having less hypoglycemia  He has not had any diabetes education Hypoglycemia: Has no documented  low blood sugars and these are very infrequent now With hypoglycemia he has symptoms of weakness, sweating, shakiness and confusion Treating low sugars with orange juice       Oral hypoglycemic drugs the patient is taking are: None      Side effects from medications have been: Edema from Actos,? Nausea and malaise from metformin INSULIN regimen is described as: Lantus 24-25, Humalog 12 units 3 times a day with meals  Compliance with the medical regimen: Fair  Glucose monitoring:  done ?  one time a day         Glucometer: One Touch.      Blood Glucose readings by download  PRE-MEAL Breakfast Lunch pcl  Bedtime Overall  Glucose range: 95-250  200 ?   Mean/median:        Self-care: The diet that the patient has been following is: tries to limit high-fat meals      Meals: 3 meals per day.      Exercise: Racquetball about 3-4 days a week mostly in morning before breakfast , sometimes late afternoon  Dietician visit, most recent: 15 years ago.               Weight history:  Wt Readings from Last 3 Encounters:  06/18/14 195 lb 9.6 oz (88.724 kg)  03/20/14 197 lb 12.8 oz (89.721 kg)  03/16/14 193 lb (87.544 kg)    Glycemic control:   Lab Results  Component Value Date   HGBA1C 10.9* 06/13/2014   HGBA1C 9.3* 03/16/2014   HGBA1C 9.7* 11/03/2013   Lab Results  Component Value Date   MICROALBUR 2.5* 03/16/2014   LDLCALC 118 11/03/2013   CREATININE 1.07 06/13/2014         Medication List       This list is accurate as of: 06/18/14 10:09 AM.  Always use your most recent med list.               B-D ULTRAFINE III SHORT PEN 31G X 8 MM Misc  Generic drug:  Insulin Pen Needle     BD INSULIN SYRINGE ULTRAFINE 31G X 5/16" 0.3 ML  Misc  Generic drug:  Insulin Syringe-Needle U-100     bimatoprost 0.01 % Soln  Commonly known as:  LUMIGAN  Place 1 drop into the left eye at bedtime.     cholecalciferol 1000 UNITS tablet  Commonly known as:  VITAMIN D  Take 5,000 Units by mouth daily.     Co Q 10 100 MG Caps  Take 100 mg by mouth 2 (two) times daily.     Dulaglutide 0.75 MG/0.5ML Sopn  Commonly known as:  TRULICITY  Inject contents of one pen once a week     ELIQUIS 5 MG Tabs tablet  Generic drug:  apixaban  Take 5 mg by mouth 2 (two) times daily.     Insulin Glargine 300 UNIT/ML Sopn  Commonly known as:  TOUJEO SOLOSTAR  Inject 30 Units into the skin daily.     insulin lispro 100 UNIT/ML KiwkPen  Commonly known as:  HUMALOG KWIKPEN  Inject 0.08-0.12 mLs (8-12 Units total) into the skin 3 (three) times daily.     lisinopril 5 MG tablet  Commonly known as:  PRINIVIL,ZESTRIL  Take 5 mg by mouth daily.     metoprolol 50 MG tablet  Commonly known as:  LOPRESSOR  Take 50 mg by mouth 2 (two) times daily.     ONETOUCH DELICA LANCETS 92E Misc     ONETOUCH VERIO test strip  Generic drug:  glucose blood        Allergies:  Allergies  Allergen Reactions  . Actos [Pioglitazone] Swelling  . Bee Venom Swelling  . Codeine Nausea Only    REACTION: hives  . Glucophage [Metformin Hcl] Nausea Only  . Raspberry Itching    Past Medical History  Diagnosis Date  . Diabetes mellitus without complication   . Testicular cancer   . Hyperlipidemia     a. Intolerant of lipitor and vytorin.  . Glaucoma   . Diabetic retinopathy     mild- Dr. Herbert Deaner  . Coronary artery disease     a. s/p CABG 2011. b. Nuc 01/2014: inf-lat scar but no ischemia, EF 57%.  . MI, old     mild LV dysfunction, EF 45-50%, inferior posterior akinesis  . Valvular heart disease     a. Mild AI/MS, mod MR by echo 01/2014.  Marland Kitchen Shortness of breath     Past Surgical History  Procedure Laterality Date  . Cardiac bypass    . Hernia repair     .  Elbow surgery    . Cardiac catheterization      2011  . Coronary artery bypass graft      LIMA-LAD, SVG-OM, SVG-PDA 06/20/09  . Cardioversion N/A 02/19/2014    Procedure: CARDIOVERSION;  Surgeon: [name], MD;  Location: [facility] ENDOSCOPY;  Service: Cardiovascular;  Laterality: N/A;    Family History  Problem Relation Age of Onset  . Heart disease Father   . Heart attack Father   . Diabetes Mother   . Diabetes Father   . Diabetes Sister   . Hypertension Father     Social History:  reports that he has quit smoking. He does not have any smokeless tobacco history on file. He reports that he drinks alcohol. He reports that he does not use illicit drugs.    Review of Systems   Most recent eye exam was ?  2014. Has had glaucoma        Lipids: He was previously on Vytorin and Lipitor. He thinks he had myalgias and weakness when taking these drugs.  He has been recommended statin drugs in the lipid clinic but he refuses to take them stating that they will not help him.  Also has significantly increased LDL particle number He was also going to be enrolled in a study from the cardiologist but apparently has not been able to get any scheduling done  Lab Results  Component Value Date   [place] 118 11/03/2013   TRIG 132 06/21/2013    He has been  treated by cardiologist for his atrial fibrillation   Physical Examination:  BP 126/73 mmHg  Pulse 72  Temp(Src) 98.1 F (36.7 C)  Resp 14  Ht 5\' 6"  (1.676 m)  Wt 195 lb 9.6 oz (88.724 kg)  BMI 31.59 kg/m2  SpO2 98%  No pedal edema  ASSESSMENT:  Diabetes type 2, uncontrolled  He has had long-standing diabetes on insulin for over 15 years with usually poor control See history of present illness for details of his current management of problems identified in blood sugar patterns He appears to be needing more insulin for control and even with using Lantus his fasting blood sugars appear to be higher since his last visit A1c  is considerably increased at 10.9 He is refusing to consider metformin because of nausea previously However he agrees to goes for some diabetes education which he has not had a sleep Has significant postprandial hyperglycemia most likely since his fasting readings are not high enough to explain an A1c of 10.9 and this was discussed.  He has not been checking readings after meals and did not bring his monitor today  PLAN:   Lantus to be changed to Toujeo and discussed that he probably will need larger doses  Discussed adjusting Toujeo every 3 days based on fasting blood sugar trend  He will keep a diary of his food intake, insulin and pre-and post prandial readings for at least 3 meals and review with nurse educator Discussed with the patient the nature of GLP-1 drugs, the action on various organ systems, how they benefit blood glucose control, as well as the benefit of weight loss and  increase satiety . Explained possible side effects especially nausea and vomiting; discussed safety information in package insert. Demonstrated the medication injection device and injection technique to the patient. Discussed injection sites and titration of Trulicity starting with 0.75 mg once a week for 2 weeks and then increasing to 1.5 mg if no symptoms of nausea. Patient brochure  on Trulicity and instructions given  He needs to follow-up with his cardiologist for his lipid management, consider doing Repatha if he is not able to get in the study   Patient Instructions  Take sugars after meals and target them <622  Start TRULICITYwith the pen as shown once weekly on the same day of the week.  You may inject in the stomach, thigh or arm as indicated in the brochure given.  You will feel fullness of the stomach with starting the medication and should try to keep the portions at meals small.  You may experience nausea in the first few days which usually gets better over time   If any questions or concerns are  present call the office or the  [place] at [phone]. Also visit [email or web address] website for more useful information  Toujeo 26 units daily adjust every 3-4 days and target am sugar <140  Check on Repatha for lipids  Please check blood sugars at least half the time about 2 hours after any meal and 3 times per week on waking up. Please bring blood sugar monitor to each visit. Recommended blood sugar levels about 2 hours after meal is 140-180 and on waking up 90-130    Counseling time over 50% of today's 25 minute visit  KUMAR,AJAY 06/18/2014, 10:09 AM   Note: This office note was prepared with Estate agent. Any transcriptional errors that result from this process are unintentional.  Addendum: Labs as follows  A1c still high, microalbumin normal  Appointment on 06/13/2014  Component Date Value Ref Range Status  . Hgb A1c MFr Bld 06/13/2014 10.9* 4.6 - 6.5 % Final   Glycemic Control Guidelines for People with Diabetes:Non Diabetic:  <6%Goal of Therapy: <7%Additional Action Suggested:  >8%   . Sodium 06/13/2014 136  135 - 145 mEq/L Final  . Potassium 06/13/2014 4.7  3.5 - 5.1 mEq/L Final  . Chloride 06/13/2014 101  96 - 112 mEq/L Final  . CO2 06/13/2014 29  19 - 32 mEq/L Final  . Glucose, Bld 06/13/2014 109* 70 - 99 mg/dL Final  . BUN 06/13/2014 18  6 - 23 mg/dL Final  . Creatinine, Ser 06/13/2014 1.07  0.40 - 1.50 mg/dL Final  . Calcium 06/13/2014 9.4  8.4 - 10.5 mg/dL Final  . GFR 06/13/2014 73.46  >60.00 mL/min Final

## 2014-07-02 ENCOUNTER — Encounter: Payer: Medicare HMO | Attending: Endocrinology | Admitting: Nutrition

## 2014-07-02 DIAGNOSIS — Z794 Long term (current) use of insulin: Secondary | ICD-10-CM | POA: Diagnosis not present

## 2014-07-02 DIAGNOSIS — Z713 Dietary counseling and surveillance: Secondary | ICD-10-CM | POA: Insufficient documentation

## 2014-07-02 DIAGNOSIS — E1165 Type 2 diabetes mellitus with hyperglycemia: Secondary | ICD-10-CM | POA: Diagnosis not present

## 2014-07-02 NOTE — Progress Notes (Signed)
Matthew [name] is here for review of diet and insulin doses. He takes Toujeo and Humalog insulins  Problems identified: 1. He takes varying amounts of Toujeo every day, based on what he ate that day. He has had several lows, as a result of taking 28u of Toujeo the day before.  2. He is not testing his blood sugars but 1-2 times/day 3.  He is retired and his activity varies daily. 4.  His meals are balanced, but very high in fat.   5.  He does not adjust the Humalog dose for varying meal sizes of carbs.  6.  He takes his insulin immediately before the meals.   Suggestions given: 1.  Stay on same dose of Toujeo for 4 days.  Adjust dose by 1-2 u, up or down depending on the average FBSs 2.  Discussed the need to test blood sugars ac and HS, because he is on 4 insulin doses, to see the effect of that dose on the meals.  Also stressed the need to test 2 hours after the meal, to see if the timing of the Humalog dose is appropriate.  3.  Discussed the need to reduce the Humalog dose the meal before by 2-4 units if playing racket ball for 2 hours after wards.  Stressed the need to have some IOB, for the blood sugar to come down after the exercise.  He was not taking any Humalog for the meal, which he was exercising afterward--causing a much higher blood sugar after the exercise than before.  (388) 4.  Discussed how fat raises blood sugar--4-6 hours after the meal.  Suggested on his very high fat meals like fried oysters, fries and hush puppies, that he take the insulin after the meal, and add 1-2 extra units for the fat calories 5.  Discussed the need to take more Humalog when eating more carbs--like [place], and other pasta meals.  He is not counting carbs, but knows portion sizes.  I suggested 3u/portion size.  And then strongly suggested that he test ac and 2hr. pc the meals to determine if this calculations will work.   6.  Suggested that he take his insulin dose and wait 10-15 min. Before eating.  He  agreed to try this.   7.  He is snacking sometimes for lunch on Nabs, or cheese and crackers.  Stressed the need for insulin coverage for this.

## 2014-07-02 NOTE — Patient Instructions (Addendum)
1.  Stay on same dose of Toujeo for 4 days.  Adjust dose by 1-2 u, up or down depending on the average FBSs 2. Try to test blood sugars before meals and at bedtime, to see the effect of the dose on the meals.  Also stressed the need to test 2 hours after the meal, to see if the timing of the Humalog dose is appropriate.  3.  Discussed the need to reduce the Humalog dose the meal before, by 2-4 units if playing racket ball for 2 hours after wards.  Remember, you need to have some" insulin on board", for the blood sugar to come down after the exercise.  4. When eating high fat meals, take the insulin after the meal, and add 1-2 extra units for the extra fat calories. 5. Take 2u of Humalog for ever serving of carbohydrate food (15 grams), and then test before, and 2hr. after the meals to determine if this calculations will work.   6.  Take humalog dose, and wait 10-15 min. Before eating.  7. Try taking 2u for meals like crackers and cheese, or nabs.  You need some insulin to cover the blood sugar rise you get after this meal, or blood sugar will go high.  If you are active after this snack, reduce the dose to 1u, otherwise it will go very high.

## 2014-07-16 ENCOUNTER — Ambulatory Visit (INDEPENDENT_AMBULATORY_CARE_PROVIDER_SITE_OTHER): Payer: Medicare HMO | Admitting: Endocrinology

## 2014-07-16 ENCOUNTER — Encounter: Payer: Self-pay | Admitting: Endocrinology

## 2014-07-16 VITALS — BP 117/59 | HR 69 | Temp 98.0°F | Resp 14 | Ht 66.0 in | Wt 195.8 lb

## 2014-07-16 DIAGNOSIS — IMO0002 Reserved for concepts with insufficient information to code with codable children: Secondary | ICD-10-CM

## 2014-07-16 DIAGNOSIS — E1165 Type 2 diabetes mellitus with hyperglycemia: Secondary | ICD-10-CM

## 2014-07-16 DIAGNOSIS — E785 Hyperlipidemia, unspecified: Secondary | ICD-10-CM

## 2014-07-16 NOTE — Progress Notes (Signed)
Patient ID: Matthew [name], male   DOB: 11-02-1945, 69 y.o.   MRN: [id number]           Reason for Appointment: Followup for Type 2 Diabetes  Referring physician: Criss Rosales  History of Present Illness:          Diagnosis: Type 2 diabetes mellitus, date of diagnosis:  1992      Past history: His blood sugar was high at diagnosis when he was having a routine screening done. He was started on Glucophage initially which he took for about a year. He thinks it did not help his sugar much and he did not feel good with it Apparently he was trying to control his diabetes with diet and exercise for a few years. He may have tried Glucophage again before going on insulin. Also was started on Actos which caused swelling He has been treated mostly with premixed insulin for about 15 years and his level of control appears to be inadequate although details of previous treatment are not available. He may have taken Humalog at one time for use with high sugars A1c was 11.5 in 2011 On his initial consultation in 9/15 because of poor control with premixed insulin he was switched to Levemir twice a day and Humalog with meals.  Recent history: On his last visit in 2/16 his A1c has gone up significantly and he was having significant issues with compliance with postprandial glucose monitoring and not adjusting both basal and bolus insulins based on his blood sugars either fasting or postprandial He was sent to the diabetes educator for day-to-day management education Also he switched from Lantus to Toujeo once a day to reduce variability in the fasting readings. He was advised to give a trial of Trulicity to help with the overall blood sugar control and weight loss but he did want to start this and has not taken any, partly because of cost The nurse educator identified the problems as follows  1. He takes varying amounts of Toujeo every day, based on what he ate that day. He has had several lows, as a result of taking  28u of Toujeo the day before.  2. He is not testing his blood sugars but 1-2 times/day 3. He is retired and his activity varies daily. 4. His meals are balanced, but very high in fat.  5. He does not adjust the Humalog dose for varying meal sizes of carbs.    He still continues to have difficulty with his control with the following blood sugar patterns and problems:  For some reason his blood sugars have been much higher fasting for the last 10 days  He was instructed by nurse educator to reduce his fat intake and he thinks he is doing this but not clear what his blood sugars are after supper  He has done only a few readings in the afternoon, mostly when he feels hypoglycemic  Has one blood sugar at lunch which was 73  Recently has only one reading at 5 PM which was 438; he does not know why  Has occasional difficulties with forgetting to take his Humalog at meals or even the Toujeo after supper  He has been able to exercise in the mornings before breakfast and usually reduces his Humalog to about 10 units in the morning after exercise which prevents hypoglycemia in the mornings  Hypoglycemia: Has 2 readings in the 60s and 1 of 53 around 3-4 PM and he does not think this is related to exercise.  With hypoglycemia he has symptoms of weakness, sweating, shakiness and confusion Treating low sugars with orange juice       Oral hypoglycemic drugs the patient is taking are: None      Side effects from medications have been: Edema from Actos,? Nausea and malaise from metformin INSULIN regimen is described as: Toujeo 24 or 25, Humalog 10-14 units 3 times a day with meals  Compliance with the medical regimen: Fair  Glucose monitoring:  done ?  one time a day         Glucometer: One Touch.      Blood Glucose readings by download  PRE-MEAL Breakfast Lunch  3-4 PM  Bedtime Overall  Glucose range:  106-282   73   53-124     Mean/median:        Self-care: The diet that the patient has  been following is: He thinks he is following a low-fat diet, probably after discussion with nurse educator    Meals: 3 meals per day.      Exercise: Racquetball about 3-4 days a week mostly in morning before breakfast , sometimes late afternoon          Dietician visit, most recent: 15 years ago.               Weight history:  Wt Readings from Last 3 Encounters:  07/16/14 195 lb 12.8 oz (88.814 kg)  06/18/14 195 lb 9.6 oz (88.724 kg)  03/20/14 197 lb 12.8 oz (89.721 kg)    Glycemic control:   Lab Results  Component Value Date   HGBA1C 10.9* 06/13/2014   HGBA1C 9.3* 03/16/2014   HGBA1C 9.7* 11/03/2013   Lab Results  Component Value Date   MICROALBUR 2.5* 03/16/2014   LDLCALC 118 11/03/2013   CREATININE 1.07 06/13/2014         Medication List       This list is accurate as of: 07/16/14  9:42 PM.  Always use your most recent med list.               BD INSULIN SYRINGE ULTRAFINE 31G X 5/16" 0.3 ML Misc  Generic drug:  Insulin Syringe-Needle U-100     BD PEN NEEDLE NANO U/F 32G X 4 MM Misc  Generic drug:  Insulin Pen Needle  See admin instructions.     bimatoprost 0.01 % Soln  Commonly known as:  LUMIGAN  Place 1 drop into the left eye at bedtime.     cholecalciferol 1000 UNITS tablet  Commonly known as:  VITAMIN D  Take 5,000 Units by mouth daily.     Co Q 10 100 MG Caps  Take 100 mg by mouth 2 (two) times daily.     Dulaglutide 0.75 MG/0.5ML Sopn  Commonly known as:  TRULICITY  Inject contents of one pen once a week     ELIQUIS 5 MG Tabs tablet  Generic drug:  apixaban  Take 5 mg by mouth 2 (two) times daily.     Insulin Glargine 300 UNIT/ML Sopn  Commonly known as:  TOUJEO SOLOSTAR  Inject 30 Units into the skin daily.     insulin lispro 100 UNIT/ML KiwkPen  Commonly known as:  HUMALOG KWIKPEN  Inject 0.08-0.12 mLs (8-12 Units total) into the skin 3 (three) times daily.     lisinopril 5 MG tablet  Commonly known as:  PRINIVIL,ZESTRIL  Take 5 mg  by mouth daily.     metoprolol 50 MG tablet  Commonly known as:  LOPRESSOR  Take 50 mg by mouth 2 (two) times daily.     ONETOUCH DELICA LANCETS 63O Misc     ONETOUCH VERIO test strip  Generic drug:  glucose blood        Allergies:  Allergies  Allergen Reactions  . Actos [Pioglitazone] Swelling  . Bee Venom Swelling  . Codeine Nausea Only    REACTION: hives  . Glucophage [Metformin Hcl] Nausea Only  . Raspberry Itching    Past Medical History  Diagnosis Date  . Diabetes mellitus without complication   . Testicular cancer   . Hyperlipidemia     a. Intolerant of lipitor and vytorin.  . Glaucoma   . Diabetic retinopathy     mild- Dr. Herbert Deaner  . Coronary artery disease     a. s/p CABG 2011. b. Nuc 01/2014: inf-lat scar but no ischemia, EF 57%.  . MI, old     mild LV dysfunction, EF 45-50%, inferior posterior akinesis  . Valvular heart disease     a. Mild AI/MS, mod MR by echo 01/2014.  Marland Kitchen Shortness of breath     Past Surgical History  Procedure Laterality Date  . Cardiac bypass    . Hernia repair    . Elbow surgery    . Cardiac catheterization      2011  . Coronary artery bypass graft      LIMA-LAD, SVG-OM, SVG-PDA 06/20/09  . Cardioversion N/A 02/19/2014    Procedure: CARDIOVERSION;  Surgeon: [name], MD;  Location: [facility] ENDOSCOPY;  Service: Cardiovascular;  Laterality: N/A;    Family History  Problem Relation Age of Onset  . Heart disease Father   . Heart attack Father   . Diabetes Mother   . Diabetes Father   . Diabetes Sister   . Hypertension Father     Social History:  reports that he has quit smoking. He does not have any smokeless tobacco history on file. He reports that he drinks alcohol. He reports that he does not use illicit drugs.    Review of Systems   Most recent eye exam was ?  2014. Has had glaucoma        Lipids: He was previously on Vytorin and Lipitor. He thinks he had myalgias and weakness when taking these drugs.  He has  been recommended statin drugs in the lipid clinic but he refuses to take them stating that they will not help him.  Also has significantly increased LDL particle number He was also going to be enrolled in a study from the cardiologist but apparently has not been able to get any scheduling done  Lab Results  Component Value Date   CHOL 216* 06/21/2013   HDL 54 06/21/2013   LDLCALC 118 11/03/2013   TRIG 132 06/21/2013    He has been  treated by cardiologist for his atrial fibrillation   Physical Examination:  BP 117/59 mmHg  Pulse 69  Temp(Src) 98 F (36.7 C)  Resp 14  Ht 5\' 6"  (1.676 m)  Wt 195 lb 12.8 oz (88.814 kg)  BMI 31.62 kg/m2  SpO2 94%  No pedal edema  ASSESSMENT:  Diabetes type 2, uncontrolled  He has had long-standing diabetes on insulin for over 15 years with usually poor control See history of present illness for details of his current management of problems identified in blood sugar patterns  Although he has had recent diabetes education he has not been compliant with glucose monitoring or adjusting his Humalog as directed  He thinks he is taking stable doses of Toujeo as instructed but fasting readings are almost always over 200 now Difficult to guide him on his mealtime coverage as he does not do postprandial readings Stressed the importance of checking postprandial readings especially after supper  PLAN:  Start monitoring blood sugars as directed more often especially after meals including after supper Given titration sheet to adjust Toujeo to get fasting blood sugars to target, explained how to use the titration sheet every 3-5 days Increase Toujeo by 4 units since fasting readings are consistently high May need to reduce Humalog at lunch but most likely needs more at supper He will call if he has any issues with hypoglycemia Offered him follow-up with nurse educator and he wants to wait    Patient Instructions  1. CHECK sugar after supper regularly and  keep <180, same applies to all meals  2. Toujeo 30 for now and adjust as discussed  3. May need less for lunch   Counseling time over 50% of today's 25 minute visit  KUMAR,AJAY 07/16/2014, 9:42 PM   Note: This office note was prepared with Estate agent. Any transcriptional errors that result from this process are unintentional.  Addendum: Labs as follows  A1c still high, microalbumin normal  No visits with results within 1 Week(s) from this visit. Latest known visit with results is:  Appointment on 06/13/2014  Component Date Value Ref Range Status  . Hgb A1c MFr Bld 06/13/2014 10.9* 4.6 - 6.5 % Final   Glycemic Control Guidelines for People with Diabetes:Non Diabetic:  <6%Goal of Therapy: <7%Additional Action Suggested:  >8%   . Sodium 06/13/2014 136  135 - 145 mEq/L Final  . Potassium 06/13/2014 4.7  3.5 - 5.1 mEq/L Final  . Chloride 06/13/2014 101  96 - 112 mEq/L Final  . CO2 06/13/2014 29  19 - 32 mEq/L Final  . Glucose, Bld 06/13/2014 109* 70 - 99 mg/dL Final  . BUN 06/13/2014 18  6 - 23 mg/dL Final  . Creatinine, Ser 06/13/2014 1.07  0.40 - 1.50 mg/dL Final  . Calcium 06/13/2014 9.4  8.4 - 10.5 mg/dL Final  . GFR 06/13/2014 73.46  >60.00 mL/min Final

## 2014-07-16 NOTE — Patient Instructions (Signed)
1. CHECK sugar after supper regularly and keep <180, same applies to all meals  2. Toujeo 30 for now and adjust as discussed  3. May need less for lunch

## 2014-08-01 ENCOUNTER — Telehealth: Payer: Self-pay | Admitting: Endocrinology

## 2014-08-01 MED ORDER — LISINOPRIL 5 MG PO TABS: 5.0000 mg | ORAL_TABLET | Freq: Every day | ORAL | Status: DC

## 2014-08-01 NOTE — Telephone Encounter (Signed)
Rx sent 

## 2014-08-01 NOTE — Telephone Encounter (Signed)
Pt needs refill on losinopril 5 mg daily

## 2014-08-27 ENCOUNTER — Other Ambulatory Visit (INDEPENDENT_AMBULATORY_CARE_PROVIDER_SITE_OTHER): Payer: Medicare HMO

## 2014-08-27 ENCOUNTER — Other Ambulatory Visit: Payer: Self-pay | Admitting: *Deleted

## 2014-08-27 DIAGNOSIS — E1165 Type 2 diabetes mellitus with hyperglycemia: Secondary | ICD-10-CM | POA: Diagnosis not present

## 2014-08-27 DIAGNOSIS — IMO0002 Reserved for concepts with insufficient information to code with codable children: Secondary | ICD-10-CM

## 2014-08-27 LAB — COMPREHENSIVE METABOLIC PANEL
ALT: 19 U/L (ref 0–53)
AST: 23 U/L (ref 0–37)
Albumin: 3.6 g/dL (ref 3.5–5.2)
Alkaline Phosphatase: 84 U/L (ref 39–117)
BUN: 15 mg/dL (ref 6–23)
CO2: 27 mEq/L (ref 19–32)
Calcium: 9.1 mg/dL (ref 8.4–10.5)
Chloride: 100 mEq/L (ref 96–112)
Creatinine, Ser: 0.92 mg/dL (ref 0.40–1.50)
GFR: 87.39 mL/min (ref >60.00)
Glucose, Bld: 209 mg/dL — ABNORMAL HIGH (ref 70–99)
Potassium: 4.6 mEq/L (ref 3.5–5.1)
Sodium: 133 mEq/L — ABNORMAL LOW (ref 135–145)
Total Bilirubin: 0.6 mg/dL (ref 0.2–1.2)
Total Protein: 6.7 g/dL (ref 6.0–8.3)

## 2014-08-27 LAB — LIPID PANEL
Cholesterol: 203 mg/dL — ABNORMAL HIGH (ref 0–200)
HDL: 40.9 mg/dL (ref >39.00)
LDL Cholesterol: 147 mg/dL — ABNORMAL HIGH (ref 0–99)
NonHDL: 162.1
Total CHOL/HDL Ratio: 5
Triglycerides: 74 mg/dL (ref 0.0–149.0)
VLDL: 14.8 mg/dL (ref 0.0–40.0)

## 2014-08-27 LAB — HEMOGLOBIN A1C: Hgb A1c MFr Bld: 8.7 % — ABNORMAL HIGH (ref 4.6–6.5)

## 2014-08-27 MED ORDER — INSULIN LISPRO 100 UNIT/ML (KWIKPEN): 8.0000 [IU] | PEN_INJECTOR | Freq: Three times a day (TID) | SUBCUTANEOUS | Status: DC

## 2014-08-30 ENCOUNTER — Encounter: Payer: Self-pay | Admitting: Endocrinology

## 2014-08-30 ENCOUNTER — Ambulatory Visit (INDEPENDENT_AMBULATORY_CARE_PROVIDER_SITE_OTHER): Payer: Medicare HMO | Admitting: Endocrinology

## 2014-08-30 VITALS — BP 142/84 | HR 102 | Temp 98.1°F | Resp 14 | Ht 66.0 in | Wt 196.8 lb

## 2014-08-30 DIAGNOSIS — E785 Hyperlipidemia, unspecified: Secondary | ICD-10-CM | POA: Diagnosis not present

## 2014-08-30 DIAGNOSIS — E1165 Type 2 diabetes mellitus with hyperglycemia: Secondary | ICD-10-CM

## 2014-08-30 DIAGNOSIS — IMO0002 Reserved for concepts with insufficient information to code with codable children: Secondary | ICD-10-CM

## 2014-08-30 NOTE — Patient Instructions (Addendum)
Must check sugar 2 hrs after dinner daily and some after lunch; must keep 2 hr sugar < 180  Exercise days reduce Humalog to 8-10 units at lunch  Toujeo 26 units for now  Please check blood sugars at least half the time about 2 hours after any meal and 3 times per week on waking up. Please bring blood sugar monitor to each visit. Recommended blood sugar levels about 2 hours after meal is 140-180 and on waking up 90-130   fat raises blood sugar--4-6 hours after the meal. Suggested take the insulin after the meal, and add 2-4 extra units for the fat calories

## 2014-08-30 NOTE — Progress Notes (Signed)
Patient ID: Matthew [name], male   DOB: [birth year], 69 y.o.   MRN: [id number]           Reason for Appointment: Followup for Type 2 Diabetes  Referring physician: Criss Rosales  History of Present Illness:          Diagnosis: Type 2 diabetes mellitus, date of diagnosis:  1992      Past history: His blood sugar was high at diagnosis when he was having a routine screening done. He was started on Glucophage initially which he took for about a year. He thinks it did not help his sugar much and he did not feel good with it Apparently he was trying to control his diabetes with diet and exercise for a few years. He may have tried Glucophage again before going on insulin. Also was started on Actos which caused swelling He has been treated mostly with premixed insulin for about 15 years and his level of control appears to be inadequate although details of previous treatment are not available. He may have taken Humalog at one time for use with high sugars A1c was 11.5 in 2011 On his initial consultation in 9/15 because of poor control with premixed insulin he was switched to Levemir twice a day and Humalog with meals.  Recent history:  INSULIN regimen is described as: Toujeo 28 hs; Humalog 14 units 3 times a day with meals   On his last visit he was advised to start checking his blood sugars more consistently Also given him flowsheet to adjust his Toujeo based on fasting blood sugars every 3 days instead of adjusting it on a daily basis He was reminded to take his Humalog consistently because of his tending to forget this. His A1c is improving but still inadequate   He still continues to have difficulty with his control with the following blood sugar patterns and problems:  For some reason his blood sugars have been quite variable in the mornings.  He has some readings over 300 and although overall they are high he has had a couple of episodes of low sugars during the night between 3-5 AM  He is  checking his blood sugars very erratically and mostly in the mornings or when he feels hypoglycemic  HYPOGLYCEMIA: He is having more problems with this and is usually in the mid afternoon.  He thinks this is usually when he is exercising in the morning.  He does not adjust his lunchtime dose if he is exercising  He has not done his readings after meals as directed again and has only sporadic number of readings in the afternoons  He cannot explain why his blood sugar 388 around 5 PM last week.    He does not adjust his Humalog based on his meal size or fat intake but sometimes is higher fat meals With hypoglycemia he has symptoms of weakness, sweating, shakiness and confusion Treating low sugars with orange juice       Oral hypoglycemic drugs the patient is taking are: None      Side effects from medications have been: Edema from Actos,? Nausea and malaise from metformin Compliance with the medical regimen: Fair  Glucose monitoring:  done ?  one time a day         Glucometer: One Touch.      Blood Glucose readings by download  PRE-MEAL Breakfast Lunch  3-5 PM   overnight  Overall  Glucose range:  128-333   69-183   53-388  58-265    Median:  211    70   63     Self-care:   Meals: 3 meals per day.  Dinner is about 8 PM.  Some meals are high fat      Exercise: Racquetball about 4 days a week mostly in morning before breakfast , sometimes late afternoon          Dietician visit, most recent: 15 years ago.     CDE visit: 3/16            Weight history:  Wt Readings from Last 3 Encounters:  08/30/14 196 lb 12.8 oz (89.268 kg)  07/16/14 195 lb 12.8 oz (88.814 kg)  06/18/14 195 lb 9.6 oz (88.724 kg)    Glycemic control:   Lab Results  Component Value Date   HGBA1C 8.7* 08/27/2014   HGBA1C 10.9* 06/13/2014   HGBA1C 9.3* 03/16/2014   Lab Results  Component Value Date   MICROALBUR 2.5* 03/16/2014   LDLCALC 147* 08/27/2014   CREATININE 0.92 08/27/2014         Medication  List       This list is accurate as of: 08/30/14 10:35 AM.  Always use your most recent med list.               BD INSULIN SYRINGE ULTRAFINE 31G X 5/16" 0.3 ML Misc  Generic drug:  Insulin Syringe-Needle U-100     BD PEN NEEDLE NANO U/F 32G X 4 MM Misc  Generic drug:  Insulin Pen Needle  See admin instructions.     bimatoprost 0.01 % Soln  Commonly known as:  LUMIGAN  Place 1 drop into the left eye at bedtime.     cholecalciferol 1000 UNITS tablet  Commonly known as:  VITAMIN D  Take 5,000 Units by mouth daily.     Co Q 10 100 MG Caps  Take 100 mg by mouth 2 (two) times daily.     ELIQUIS 5 MG Tabs tablet  Generic drug:  apixaban  Take 5 mg by mouth 2 (two) times daily.     Insulin Glargine 300 UNIT/ML Sopn  Commonly known as:  TOUJEO SOLOSTAR  Inject 30 Units into the skin daily.     insulin lispro 100 UNIT/ML KiwkPen  Commonly known as:  HUMALOG KWIKPEN  Inject 0.08-0.12 mLs (8-12 Units total) into the skin 3 (three) times daily. Per sliding scale     lisinopril 5 MG tablet  Commonly known as:  PRINIVIL,ZESTRIL  Take 1 tablet (5 mg total) by mouth daily.     metoprolol 50 MG tablet  Commonly known as:  LOPRESSOR  Take 50 mg by mouth 2 (two) times daily.     ONETOUCH DELICA LANCETS 35D Misc     ONETOUCH VERIO test strip  Generic drug:  glucose blood     TURMERIC PO  Take 1,000 mg by mouth 2 (two) times daily.        Allergies:  Allergies  Allergen Reactions  . Actos [Pioglitazone] Swelling  . Bee Venom Swelling  . Codeine Nausea Only    REACTION: hives  . Glucophage [Metformin Hcl] Nausea Only  . Raspberry Itching    Past Medical History  Diagnosis Date  . Diabetes mellitus without complication   . Testicular cancer   . Hyperlipidemia     a. Intolerant of lipitor and vytorin.  . Glaucoma   . Diabetic retinopathy     mild- Dr. Herbert Deaner  . Coronary artery disease  a. s/p CABG 2011. b. Nuc 01/2014: inf-lat scar but no ischemia, EF 57%.  .  MI, old     mild LV dysfunction, EF 45-50%, inferior posterior akinesis  . Valvular heart disease     a. Mild AI/MS, mod MR by echo 01/2014.  Marland Kitchen Shortness of breath     Past Surgical History  Procedure Laterality Date  . Cardiac bypass    . Hernia repair    . Elbow surgery    . Cardiac catheterization      2011  . Coronary artery bypass graft      LIMA-LAD, SVG-OM, SVG-PDA 06/20/09  . Cardioversion N/A 02/19/2014    Procedure: CARDIOVERSION;  Surgeon: [name], MD;  Location: [facility] ENDOSCOPY;  Service: Cardiovascular;  Laterality: N/A;    Family History  Problem Relation Age of Onset  . Heart disease Father   . Heart attack Father   . Diabetes Mother   . Diabetes Father   . Diabetes Sister   . Hypertension Father     Social History:  reports that he has quit smoking. He does not have any smokeless tobacco history on file. He reports that he drinks alcohol. He reports that he does not use illicit drugs.    Review of Systems   Most recent eye exam was ?  2014. Has had glaucoma        Lipids: He was previously on Vytorin and Lipitor. He thinks he had myalgias and weakness when taking these drugs.  He has been recommended statin drugs in the lipid clinic but he refuses to take them stating that they will not help him.  Also has significantly increased LDL particle number He has been interested in alternative treatments but has not contacted his cardiologist for this   Lab Results  Component Value Date   CHOL 203* 08/27/2014   HDL 40.90 08/27/2014   LDLCALC 147* 08/27/2014   TRIG 74.0 08/27/2014   CHOLHDL 5 08/27/2014    He has been  treated by cardiologist for his atrial fibrillation   Physical Examination:  BP 142/84 mmHg  Pulse 102  Temp(Src) 98.1 F (36.7 C)  Resp 14  Ht 5\' 6"  (1.676 m)  Wt 196 lb 12.8 oz (89.268 kg)  BMI 31.78 kg/m2  SpO2 97%  No pedal edema  ASSESSMENT:  Diabetes type 2, uncontrolled  He has had long-standing diabetes on  insulin for over 15 years with usually poor control See history of present illness for details of his current management of problems identified in blood sugar patterns  Although he has had recent diabetes education he has not been compliant with glucose monitoring as directed Difficult to assess how much insulin he needs or how to have him adjust his doses because of his checking blood sugars primarily in the mornings and not after meals He has marked variability in his morning sugars and this is likely related to inconsistent diet in the evenings Discussed need for adjusting the supper time dose based on what he is eating and also to check his sugar 2 hours after eating to assess degree of hyperglycemia He does need to factor and high fat intake also has discussed by nurse educator previously  r  PLAN:  Start monitoring blood sugars as directed more often after meals including after supper Reduce Toujeo by 2 units for now to avoid nocturnal hypoglycemia  Adjust Humalog as discussed today: 4 lunchtime dose he will need to reduce it by 4-6 units when  exercising in the morning For his evening dose he will adjust the dose based on his meal intake with extra insulin for higher fat meals and also may need overall higher doses if postprandial readings are consistently high  Schedule for iPro for review of his 24-hour control based on his diet and activity level as well as insulin doses He will call if he has any issues with recurrent hypoglycemia Offered him follow-up with nurse educator and he wants to wait   Patient Instructions  Must check sugar 2 hrs after dinner daily and some after lunch; must keep 2 hr sugar < 180  Exercise days reduce Humalog to 8-10 units at lunch  Toujeo 26 units for now  Please check blood sugars at least half the time about 2 hours after any meal and 3 times per week on waking up. Please bring blood sugar monitor to each visit. Recommended blood sugar levels about  2 hours after meal is 140-180 and on waking up 90-130   fat raises blood sugar--4-6 hours after the meal. Suggested take the insulin after the meal, and add 2-4 extra units for the fat calories   HYPERLIPIDEMIA: He will be referred to the Progress 9 study at the cardiologist's office  Counseling time on subjects discussed above is over 50% of today's 25 minute visit  KUMAR,AJAY 08/30/2014, 10:35 AM   Note: This office note was prepared with Estate agent. Any transcriptional errors that result from this process are unintentional.    Lab on 08/27/2014  Component Date Value Ref Range Status  . Hgb A1c MFr Bld 08/27/2014 8.7* 4.6 - 6.5 % Final   Glycemic Control Guidelines for People with Diabetes:Non Diabetic:  <6%Goal of Therapy: <7%Additional Action Suggested:  >8%   . Cholesterol 08/27/2014 203* 0 - 200 mg/dL Final   ATP III Classification       Desirable:  < 200 mg/dL               Borderline High:  200 - 239 mg/dL          High:  > = 240 mg/dL  . Triglycerides 08/27/2014 74.0  0.0 - 149.0 mg/dL Final   Normal:  <150 mg/dLBorderline High:  150 - 199 mg/dL  . HDL 08/27/2014 40.90  >39.00 mg/dL Final  . VLDL 08/27/2014 14.8  0.0 - 40.0 mg/dL Final  . LDL Cholesterol 08/27/2014 147* 0 - 99 mg/dL Final  . Total CHOL/HDL Ratio 08/27/2014 5   Final                  Men          Women1/2 Average Risk     3.4          3.3Average Risk          5.0          4.42X Average Risk          9.6          7.13X Average Risk          15.0          11.0                      . NonHDL 08/27/2014 162.10   Final   NOTE:  Non-HDL goal should be 30 mg/dL higher than patient's LDL goal (i.e. LDL goal of < 70 mg/dL, would have non-HDL goal of < 100 mg/dL)  . Sodium  08/27/2014 133* 135 - 145 mEq/L Final  . Potassium 08/27/2014 4.6  3.5 - 5.1 mEq/L Final  . Chloride 08/27/2014 100  96 - 112 mEq/L Final  . CO2 08/27/2014 27  19 - 32 mEq/L Final  . Glucose, Bld 08/27/2014 209* 70 - 99  mg/dL Final  . BUN 08/27/2014 15  6 - 23 mg/dL Final  . Creatinine, Ser 08/27/2014 0.92  0.40 - 1.50 mg/dL Final  . Total Bilirubin 08/27/2014 0.6  0.2 - 1.2 mg/dL Final  . Alkaline Phosphatase 08/27/2014 84  39 - 117 U/L Final  . AST 08/27/2014 23  0 - 37 U/L Final  . ALT 08/27/2014 19  0 - 53 U/L Final  . Total Protein 08/27/2014 6.7  6.0 - 8.3 g/dL Final  . Albumin 08/27/2014 3.6  3.5 - 5.2 g/dL Final  . Calcium 08/27/2014 9.1  8.4 - 10.5 mg/dL Final  . GFR 08/27/2014 87.39  >60.00 mL/min Final

## 2014-09-06 ENCOUNTER — Telehealth: Payer: Self-pay | Admitting: *Deleted

## 2014-09-06 NOTE — Telephone Encounter (Signed)
OK to fill Eliquis

## 2014-09-06 NOTE — Telephone Encounter (Signed)
Left message for pt to call back  °

## 2014-09-06 NOTE — Telephone Encounter (Signed)
Pt returned call. Confirmed pt is taking Eliquis 5mg  BID. Pt states that PCP normally refills this medication but pt feels that Dr. Irish Lack would be the better person to follow his Eliquis. Informed pt that I would route this information to Dr. Irish Lack and see if he is ok with following pt's Eliquis or if he preferred that he continue with his PCP. Pt verbalized understanding and was in agreement.

## 2014-09-06 NOTE — Telephone Encounter (Signed)
OK to fill eliquis

## 2014-09-06 NOTE — Telephone Encounter (Signed)
Left message to call back  

## 2014-09-06 NOTE — Telephone Encounter (Signed)
Patient called and stated that he was put on eliquis by his pcp. He would like to know if Dr Irish Lack would send him in an rx for this. He is due for a six month follow up appointment which I made him aware of. I transferred him to scheduling and he did set one up. Please advise on refill. Thanks, MI

## 2014-09-07 MED ORDER — APIXABAN 5 MG PO TABS: 5.0000 mg | ORAL_TABLET | Freq: Two times a day (BID) | ORAL | Status: DC

## 2014-09-07 NOTE — Telephone Encounter (Signed)
Spoke with pt and made him aware that Dr. Irish Lack said it was ok to fill Eliquis. Verified pharmacy. Pt verbalized understanding.

## 2014-10-07 ENCOUNTER — Encounter (HOSPITAL_COMMUNITY): Payer: Self-pay | Admitting: Emergency Medicine

## 2014-10-07 ENCOUNTER — Emergency Department (HOSPITAL_COMMUNITY)
Admission: EM | Admit: 2014-10-07 | Discharge: 2014-10-07 | Disposition: A | Discharge status: Home / Self Care | Payer: Medicare HMO | Attending: Emergency Medicine | Admitting: Emergency Medicine

## 2014-10-07 DIAGNOSIS — Z87891 Personal history of nicotine dependence: Secondary | ICD-10-CM | POA: Diagnosis not present

## 2014-10-07 DIAGNOSIS — Z794 Long term (current) use of insulin: Secondary | ICD-10-CM | POA: Insufficient documentation

## 2014-10-07 DIAGNOSIS — E11319 Type 2 diabetes mellitus with unspecified diabetic retinopathy without macular edema: Secondary | ICD-10-CM | POA: Diagnosis not present

## 2014-10-07 DIAGNOSIS — H409 Unspecified glaucoma: Secondary | ICD-10-CM | POA: Diagnosis not present

## 2014-10-07 DIAGNOSIS — Z9889 Other specified postprocedural states: Secondary | ICD-10-CM | POA: Insufficient documentation

## 2014-10-07 DIAGNOSIS — Z951 Presence of aortocoronary bypass graft: Secondary | ICD-10-CM | POA: Insufficient documentation

## 2014-10-07 DIAGNOSIS — R001 Bradycardia, unspecified: Secondary | ICD-10-CM | POA: Diagnosis present

## 2014-10-07 DIAGNOSIS — I4892 Unspecified atrial flutter: Secondary | ICD-10-CM | POA: Insufficient documentation

## 2014-10-07 DIAGNOSIS — I2510 Atherosclerotic heart disease of native coronary artery without angina pectoris: Secondary | ICD-10-CM | POA: Insufficient documentation

## 2014-10-07 DIAGNOSIS — I252 Old myocardial infarction: Secondary | ICD-10-CM | POA: Insufficient documentation

## 2014-10-07 DIAGNOSIS — I4891 Unspecified atrial fibrillation: Secondary | ICD-10-CM | POA: Insufficient documentation

## 2014-10-07 DIAGNOSIS — R002 Palpitations: Secondary | ICD-10-CM

## 2014-10-07 DIAGNOSIS — Z8547 Personal history of malignant neoplasm of testis: Secondary | ICD-10-CM | POA: Diagnosis not present

## 2014-10-07 DIAGNOSIS — Z79899 Other long term (current) drug therapy: Secondary | ICD-10-CM | POA: Insufficient documentation

## 2014-10-07 LAB — BASIC METABOLIC PANEL
Anion gap: 8 (ref 5–15)
BUN: 21 mg/dL — ABNORMAL HIGH (ref 6–20)
CO2: 26 mmol/L (ref 22–32)
Calcium: 8.7 mg/dL — ABNORMAL LOW (ref 8.9–10.3)
Chloride: 99 mmol/L — ABNORMAL LOW (ref 101–111)
Creatinine, Ser: 1.04 mg/dL (ref 0.61–1.24)
GFR calc Af Amer: >60 mL/min (ref >60)
GFR calc non Af Amer: >60 mL/min (ref >60)
Glucose, Bld: 187 mg/dL — ABNORMAL HIGH (ref 65–99)
Potassium: 4.4 mmol/L (ref 3.5–5.1)
Sodium: 133 mmol/L — ABNORMAL LOW (ref 135–145)

## 2014-10-07 LAB — CBC WITH DIFFERENTIAL/PLATELET
Basophils Absolute: 0 10*3/uL (ref 0.0–0.1)
Basophils Relative: 0 % (ref 0–1)
Eosinophils Absolute: 0.3 10*3/uL (ref 0.0–0.7)
Eosinophils Relative: 5 % (ref 0–5)
HCT: 34.9 % — ABNORMAL LOW (ref 39.0–52.0)
Hemoglobin: 11.9 g/dL — ABNORMAL LOW (ref 13.0–17.0)
Lymphocytes Relative: 31 % (ref 12–46)
Lymphs Abs: 2.1 10*3/uL (ref 0.7–4.0)
MCH: 29.6 pg (ref 26.0–34.0)
MCHC: 34.1 g/dL (ref 30.0–36.0)
MCV: 86.8 fL (ref 78.0–100.0)
Monocytes Absolute: 0.6 10*3/uL (ref 0.1–1.0)
Monocytes Relative: 8 % (ref 3–12)
Neutro Abs: 3.7 10*3/uL (ref 1.7–7.7)
Neutrophils Relative %: 56 % (ref 43–77)
Platelets: 200 10*3/uL (ref 150–400)
RBC: 4.02 MIL/uL — ABNORMAL LOW (ref 4.22–5.81)
RDW: 13.2 % (ref 11.5–15.5)
WBC: 6.7 10*3/uL (ref 4.0–10.5)

## 2014-10-07 LAB — TROPONIN I: Troponin I: <0.03 ng/mL (ref <0.031)

## 2014-10-07 LAB — BRAIN NATRIURETIC PEPTIDE: B Natriuretic Peptide: 209.1 pg/mL — ABNORMAL HIGH (ref 0.0–100.0)

## 2014-10-07 NOTE — ED Notes (Signed)
Patient requests no xray at this time. Called Dr. Mariane Masters to report. MD acknowledges, gives verbal order to cancel xray.

## 2014-10-07 NOTE — Discharge Instructions (Signed)
We saw you in the ER for the palpitations - you are indeed in afib/flutter. All the results in the ER are normal, labs and imaging. CARDIOLOGY team wants you to be seen in the clinic for further management.   Atrial Flutter Atrial flutter is a heart rhythm that can cause the heart to beat very fast (tachycardia). It originates in the upper chambers of the heart (atria). In atrial flutter, the top chambers of the heart (atria) often beat much faster than the bottom chambers of the heart (ventricles). Atrial flutter has a regular "saw toothed" appearance in an EKG readout. An EKG is a test that records the electrical activity of the heart. Atrial flutter can cause the heart to beat up to 150 beats per minute (BPM). Atrial flutter can either be short lived (paroxysmal) or permanent.  CAUSES  Causes of atrial flutter can be many. Some of these include:  Heart related issues:  Heart attack (myocardial infarction).  Heart failure.  Heart valve problems.  Poorly controlled high blood pressure (hypertension).  After open heart surgery.  Lung related issues:  A blood clot in the lungs (pulmonary embolism).  Chronic obstructive pulmonary disease (COPD). Medications used to treat COPD can attribute to atrial flutter.  Other related causes:  Hyperthyroidism.  Caffeine.  Some decongestant cold medications.  Low electrolyte levels such as potassium or magnesium.  Cocaine. SYMPTOMS  An awareness of your heart beating rapidly (palpitations).  Shortness of breath.  Chest pain.  Low blood pressure (hypotension).  Dizziness or fainting. DIAGNOSIS  Different tests can be performed to diagnose atrial flutter.   An EKG.  Holter monitor. This is a 24-hour recording of your heart rhythm. You will also be given a diary. Write down all symptoms that you have and what you were doing at the time you experienced symptoms.  Cardiac event monitor. This small device can be worn for up to 30  days. When you have heart symptoms, you will push a button on the device. This will then record your heart rhythm.  Echocardiogram. This is an imaging test to look at your heart. Your caregiver will look at your heart valves and the ventricles.  Stress test. This test can help determine if the atrial flutter is related to exercise or if coronary artery disease is present.  Laboratory studies will look at certain blood levels like:  Complete blood count (CBC).  Potassium.  Magnesium.  Thyroid function. TREATMENT  Treatment of atrial flutter varies. A combination of therapies may be used or sometimes atrial flutter may need only 1 type of treatment.  Lab work: If your blood work, such as your electrolytes (potassium, magnesium) or your thyroid function tests, are abnormal, your caregiver will treat them accordingly.  Medication:  There are several different types of medications that can convert your heart to a normal rhythm and prevent atrial flutter from reoccurring.  Nonsurgical procedures: Nonsurgical techniques may be used to control atrial flutter. Some examples include:  Cardioversion. This technique uses either drugs or an electrical shock to restore a normal heart rhythm:  Cardioversion drugs may be given through an intravenous (IV) line to help "reset" the heart rhythm.  In electrical cardioversion, your caregiver shocks your heart with electrical energy. This helps to reset the heartbeat to a normal rhythm.  Ablation. If atrial flutter is a persistent problem, an ablation may be needed. This procedure is done under mild sedation. High frequency radio-wave energy is used to destroy the area of heart tissue responsible  for atrial flutter. SEEK IMMEDIATE MEDICAL CARE IF:  You have:  Dizziness.  Near fainting or fainting.  Shortness of breath.  Chest pain or pressure.  Sudden nausea or vomiting.  Profuse sweating. If you have the above symptoms, call your local  emergency service immediately! Do not drive yourself to the hospital. MAKE SURE YOU:   Understand these instructions.  Will watch your condition.  Will get help right away if you are not doing well or get worse. Document Released: 08/30/2008 Document Revised: 08/28/2013 Document Reviewed: 08/30/2008 [facility] Patient Information 2015 [place], [place]. This information is not intended to replace advice given to you by your health care provider. Make sure you discuss any questions you have with your health care provider.

## 2014-10-07 NOTE — ED Notes (Signed)
Patient states he was laying in bed and felt like his heart was very slow for at least 2 hours. Heart rate currently 104, aflutter on the monitor. Patient alert and oriented. Hx of afib, with ablation this past November.

## 2014-10-07 NOTE — ED Notes (Signed)
ekg was given to Dr. Mariane Masters

## 2014-10-08 NOTE — ED Provider Notes (Signed)
CSN: [id number]     Arrival date & time 10/07/14  4098 History   First MD Initiated Contact with Patient 10/07/14 [phone]     Chief Complaint  Patient presents with  . Bradycardia     (Consider location/radiation/quality/duration/timing/severity/associated sxs/prior Treatment) HPI Comments: Pt with hx of afib comes in with cc of palpitations. He has hx of afib and is s/p ablation and was in sinus rhythm until 2 weeks ago, when he started feeling palpitations after playing sports. Pt has no cp, dib, dizziness. He does feel a bit more tired than usual.   ROS 10 Systems reviewed and are negative for acute change except as noted in the HPI.     The history is provided by the patient.    Past Medical History  Diagnosis Date  . Diabetes mellitus without complication   . Testicular cancer   . Hyperlipidemia     a. Intolerant of lipitor and vytorin.  . Glaucoma   . Diabetic retinopathy     mild- Dr. Herbert Deaner  . Coronary artery disease     a. s/p CABG 2011. b. Nuc 01/2014: inf-lat scar but no ischemia, EF 57%.  . MI, old     mild LV dysfunction, EF 45-50%, inferior posterior akinesis  . Valvular heart disease     a. Mild AI/MS, mod MR by echo 01/2014.  Marland Kitchen Shortness of breath    Past Surgical History  Procedure Laterality Date  . Cardiac bypass    . Elbow surgery    . Cardiac catheterization      2011  . Coronary artery bypass graft      LIMA-LAD, SVG-OM, SVG-PDA 06/20/09  . Cardioversion N/A 02/19/2014    Procedure: CARDIOVERSION;  Surgeon: [name], MD;  Location: [facility] ENDOSCOPY;  Service: Cardiovascular;  Laterality: N/A;  . Hernia repair      patient reports exploratory surgery for hernia, but didn't have one  . Other surgical history      broken ulna on left elbow-surgical repair   Family History  Problem Relation Age of Onset  . Heart disease Father   . Heart attack Father   . Diabetes Mother   . Diabetes Father   . Diabetes Sister   . Hypertension Father     History  Substance Use Topics  . Smoking status: Former Research scientist (life sciences)  . Smokeless tobacco: Not on file  . Alcohol Use: 1.2 oz/week    2 Cans of beer per week     Comment: occasional    Review of Systems  All other systems reviewed and are negative.     Allergies  Actos; Bee venom; Codeine; Glucophage; and Raspberry  Home Medications   Prior to Admission medications   Medication Sig Start Date End Date Taking? Authorizing Provider  apixaban (ELIQUIS) 5 MG TABS tablet Take 1 tablet (5 mg total) by mouth 2 (two) times daily. 09/07/14  Yes Jettie Booze, MD  bimatoprost (LUMIGAN) 0.01 % SOLN Place 1 drop into the left eye at bedtime.   Yes Historical Provider, MD  cholecalciferol (VITAMIN D) 1000 UNITS tablet Take 5,000 Units by mouth daily.   Yes Historical Provider, MD  Coenzyme Q10 (CO Q 10) 100 MG CAPS Take 100 mg by mouth 2 (two) times daily.   Yes Historical Provider, MD  Insulin Glargine (TOUJEO SOLOSTAR) 300 UNIT/ML SOPN Inject 30 Units into the skin daily. Patient taking differently: Inject 26-30 Units into the skin daily.  05/22/14  Yes Elayne Snare, MD  insulin lispro (HUMALOG KWIKPEN) 100 UNIT/ML KiwkPen Inject 0.08-0.12 mLs (8-12 Units total) into the skin 3 (three) times daily. Per sliding scale Patient taking differently: Inject 12-14 Units into the skin 3 (three) times daily. Per sliding scale 08/27/14  Yes Elayne Snare, MD  lisinopril (PRINIVIL,ZESTRIL) 5 MG tablet Take 1 tablet (5 mg total) by mouth daily. 08/01/14  Yes Elayne Snare, MD  metoprolol (LOPRESSOR) 50 MG tablet Take 50 mg by mouth 2 (two) times daily.    Yes Historical Provider, MD  TURMERIC PO Take 450 mg by mouth daily.    Yes Historical Provider, MD   BP 143/93 mmHg  Pulse 92  Temp(Src) 98 F (36.7 C) (Oral)  Resp 16  Ht 5\' 6"  (1.676 m)  Wt 206 lb 9 oz (93.696 kg)  BMI 33.36 kg/m2  SpO2 99% Physical Exam  Constitutional: He is oriented to person, place, and time. He appears well-developed.  HENT:   Head: Normocephalic and atraumatic.  Eyes: Conjunctivae and EOM are normal. Pupils are equal, round, and reactive to light.  Neck: Normal range of motion. Neck supple.  Cardiovascular: Normal rate and regular rhythm.   Pulmonary/Chest: Effort normal and breath sounds normal.  Abdominal: Soft. Bowel sounds are normal. He exhibits no distension. There is no tenderness. There is no rebound and no guarding.  Neurological: He is alert and oriented to person, place, and time.  Skin: Skin is warm.  Nursing note and vitals reviewed.   ED Course  Procedures (including critical care time) Labs Review Labs Reviewed  CBC WITH DIFFERENTIAL/PLATELET - Abnormal; Notable for the following:    RBC 4.02 (*)    Hemoglobin 11.9 (*)    HCT 34.9 (*)    All other components within normal limits  BASIC METABOLIC PANEL - Abnormal; Notable for the following:    Sodium 133 (*)    Chloride 99 (*)    Glucose, Bld 187 (*)    BUN 21 (*)    Calcium 8.7 (*)    All other components within normal limits  BRAIN NATRIURETIC PEPTIDE - Abnormal; Notable for the following:    B Natriuretic Peptide 209.1 (*)    All other components within normal limits  TROPONIN I    Imaging Review No results found.   EKG Interpretation None      MDM   Final diagnoses:  Atrial fibrillation and flutter    Pt is in afib again. Rate controlled. He is to see his Cardiologist next week.   Varney Biles, MD 10/08/14 [phone]

## 2014-10-12 ENCOUNTER — Encounter: Payer: Self-pay | Admitting: Nurse Practitioner

## 2014-10-12 ENCOUNTER — Ambulatory Visit (INDEPENDENT_AMBULATORY_CARE_PROVIDER_SITE_OTHER): Payer: Medicare HMO | Admitting: Nurse Practitioner

## 2014-10-12 VITALS — BP 130/62 | HR 96 | Ht 66.0 in | Wt 200.1 lb

## 2014-10-12 DIAGNOSIS — I2510 Atherosclerotic heart disease of native coronary artery without angina pectoris: Secondary | ICD-10-CM | POA: Diagnosis not present

## 2014-10-12 DIAGNOSIS — I4892 Unspecified atrial flutter: Secondary | ICD-10-CM | POA: Diagnosis not present

## 2014-10-12 DIAGNOSIS — I4891 Unspecified atrial fibrillation: Secondary | ICD-10-CM | POA: Diagnosis not present

## 2014-10-12 DIAGNOSIS — I10 Essential (primary) hypertension: Secondary | ICD-10-CM

## 2014-10-12 DIAGNOSIS — E119 Type 2 diabetes mellitus without complications: Secondary | ICD-10-CM | POA: Insufficient documentation

## 2014-10-12 DIAGNOSIS — E1069 Type 1 diabetes mellitus with other specified complication: Secondary | ICD-10-CM | POA: Insufficient documentation

## 2014-10-12 DIAGNOSIS — I5031 Acute diastolic (congestive) heart failure: Secondary | ICD-10-CM | POA: Diagnosis not present

## 2014-10-12 MED ORDER — FUROSEMIDE 40 MG PO TABS: 40.0000 mg | ORAL_TABLET | Freq: Every day | ORAL | Status: DC

## 2014-10-12 MED ORDER — POTASSIUM CHLORIDE ER 10 MEQ PO TBCR: 10.0000 meq | EXTENDED_RELEASE_TABLET | Freq: Every day | ORAL | Status: DC

## 2014-10-12 NOTE — Patient Instructions (Addendum)
Medication Instructions:  1- Increase Metoprolol 75 mg (1 1/2 tablets) by mouth twice daily  2- START Lasix 40 mg by mouth daily  3- START Potassium 10 mg by mouth daily  Labwork: Your physician recommends that you return for lab work in: 1 week for BMET  Testing/Procedures: NONE  Follow-Up: Your physician recommends that you schedule a follow-up appointment in: 2 weeks with Ignacia Bayley NP on FLEX schedule. Your physician recommends that you schedule a follow-up appointment in: ASAP with Dr. Lovena Le (EP) referral for Ablation.    Any Other Special Instructions Will Be Listed Below (If Applicable).

## 2014-10-12 NOTE — Progress Notes (Signed)
Patient Name: Matthew [name] Date of Encounter: 10/12/2014  Primary Care Provider:  Elyn Peers, MD Primary Cardiologist:  Lendell Caprice, MD  Chief Complaint  69 year old male with a history of paroxysmal atrial flutter and CAD who presents with recurrent atrial flutter.  Past Medical History   Past Medical History  Diagnosis Date  . Diabetes mellitus without complication   . Testicular cancer   . Hyperlipidemia     a. Intolerant of lipitor and vytorin.  . Glaucoma   . Diabetic retinopathy     mild- Dr. Herbert Deaner  . Coronary artery disease     a. 05/2009 CABG x 3: LIMA->LAD, VG->OM, VG->PDA; b. Nuc 01/2014: inf-lat scar but no ischemia, EF 57%.  . Valvular heart disease     a. 01/2014 Echo: Ef 60-65%, no rwma, mild AI/MS, mod MR, mildly dil LA.  . Paroxysmal atrial flutter     a. 01/2014 s/p DCCV;  b. CHA2DS2VASc = 3-->Eliquis.   Past Surgical History  Procedure Laterality Date  . Cardiac bypass    . Elbow surgery    . Cardiac catheterization      2011  . Coronary artery bypass graft      LIMA-LAD, SVG-OM, SVG-PDA 06/20/09  . Cardioversion N/A 02/19/2014    Procedure: CARDIOVERSION;  Surgeon: [name], MD;  Location: [facility] ENDOSCOPY;  Service: Cardiovascular;  Laterality: N/A;  . Hernia repair      patient reports exploratory surgery for hernia, but didn't have one  . Other surgical history      broken ulna on left elbow-surgical repair    Allergies  Allergies  Allergen Reactions  . Actos [Pioglitazone] Swelling  . Bee Venom Swelling  . Codeine Nausea Only    REACTION: hives  . Glucophage [Metformin Hcl] Nausea Only  . Raspberry Itching    HPI  69 year old male with the above complex problem list. He does have a history of coronary artery disease and is status post coronary artery bypass grafting 2011. He also has a history of atrial flutter status post cardioversion October 2015. He has been maintained on eliquis and beta blocker therapy. He is very  active and was in his usual state of health until approximately 2 weeks ago when he began to experience palpitations and lightheadedness shortly after playing racquetball. Over the past 2 weeks, he has continued to have palpitations and also has had 6 pound weight gain with dyspnea exertion, abdominal bloating, lower extremity edema, and orthopnea. He was recently seen in the emergency department on June 12 related to the symptoms.  He was in relatively rate controlled a flutter. Electrolytes were within normal limits. His BNP was mildly elevated. He was advised to follow-up with cardiology. He has not been having any chest pain. He remains in atrial flutter today.  Home Medications  Prior to Admission medications   Medication Sig Start Date End Date Taking? Authorizing Provider  apixaban (ELIQUIS) 5 MG TABS tablet Take 1 tablet (5 mg total) by mouth 2 (two) times daily. 09/07/14  Yes Jettie Booze, MD  bimatoprost (LUMIGAN) 0.01 % SOLN Place 1 drop into the left eye at bedtime.   Yes Historical Provider, MD  cholecalciferol (VITAMIN D) 1000 UNITS tablet Take 5,000 Units by mouth daily.   Yes Historical Provider, MD  Coenzyme Q10 (CO Q 10) 100 MG CAPS Take 100 mg by mouth 2 (two) times daily.   Yes Historical Provider, MD  Insulin Glargine (TOUJEO SOLOSTAR) 300 UNIT/ML SOPN Inject 30  Units into the skin daily. Patient taking differently: Inject 26-30 Units into the skin daily.  05/22/14  Yes Elayne Snare, MD  insulin lispro (HUMALOG) 100 UNIT/ML injection Inject 8-12 Units into the skin 3 (three) times daily before meals. Sliding scale   Yes Historical Provider, MD  lisinopril (PRINIVIL,ZESTRIL) 5 MG tablet Take 1 tablet (5 mg total) by mouth daily. 08/01/14  Yes Elayne Snare, MD  metoprolol (LOPRESSOR) 50 MG tablet Take 50 mg by mouth 2 (two) times daily.    Yes Historical Provider, MD  TURMERIC PO Take 450 mg by mouth daily.    Yes Historical Provider, MD    Review of Systems  Palpitations,  dyspnea on exertion, orthopnea, location edema, increasing abdominal girth, and 6 pound waking over the past 2 weeks.  All other systems reviewed and are otherwise negative except as noted above.  Physical Exam  VS:  BP 130/62 mmHg  Pulse 96  Ht 5\' 6"  (1.676 m)  Wt 200 lb 1.9 oz (90.774 kg)  BMI 32.32 kg/m2  SpO2 91% , BMI Body mass index is 32.32 kg/(m^2). GEN: Well nourished, well developed, in no acute distress. HEENT: normal. Neck: Supple, mildly elevated JVP, carotid bruits, or masses. Cardiac: Irregular, no murmurs, rubs, or gallops. No clubbing, cyanosis. 2+ bilateral lower extremity edema to the knee.  Radials/DP/PT 2+ and equal bilaterally.  Respiratory:  Respirations regular and unlabored, clear to auscultation bilaterally. GI: Semi-firm and protuberant, nontender, BS + x 4. MS: no deformity or atrophy. Skin: warm and dry, no rash. Neuro:  Strength and sensation are intact. Psych: Normal affect.  Accessory Clinical Findings  ECG - atrial flutter with variable block, 96, old inferior infarct, delayed R-wave progression, no acute ST or T changes.  Assessment & Plan  1.  Paroxysmal atrial flutter: Patient presents with a two-week history of palpitations with increasing abdominal girth, lotion edema, orthopnea, and 6 pound weight gain. He is back in atrial flutter. He is reasonably well rate controlled in the 90s. He clearly does not tolerate being in atrial flutter. We discussed his options today and I also discussed his case with Dr. Lovena Le. I'm adding Lasix 40 mg daily and increasing metoprolol to 75 mg twice a day. He will remain on eliquis. I will check a basic metabolic profile next week and plan to see him back in 2 weeks. In the meantime, we will work to get him into see electrophysiology as soon as possible for evaluation and scheduling of ablation. Patient is in favor of this plan. If, he does not respond well to Lasix her symptoms worsen, we have discussed the option of  elective cardioversion however, we would prefer to avoid this at this time as there is no guarantee he would stay in sinus rhythm prior to seeing electrophysiology.  2. Acute diastolic congestive heart failure: Normal LV function by echo in October. He does not tolerate atrial flutter. Adding Lasix and low-dose potassium as above. I will check a basic metabolic panel next week.  3. Essential hypertension: Stable. Next  4. Type 2 diabetes mellitus: Followed by primary care.  5. Hyperlipidemia: He is apparently intolerant to statins. His LDL of 147. We can consider addition of a pcsk9 inhibitor following mgmt of above.  6. Disposition: Follow-up labs next week. Follow up in clinic in 2 weeks with EP follow-up as soon as possible.   Murray Hodgkins, NP 10/12/2014, 10:36 AM

## 2014-10-15 ENCOUNTER — Ambulatory Visit: Payer: Medicare HMO | Admitting: Physician Assistant

## 2014-10-18 ENCOUNTER — Encounter: Payer: Self-pay | Admitting: Internal Medicine

## 2014-10-18 ENCOUNTER — Ambulatory Visit: Payer: Medicare HMO | Admitting: *Deleted

## 2014-10-18 ENCOUNTER — Other Ambulatory Visit (INDEPENDENT_AMBULATORY_CARE_PROVIDER_SITE_OTHER): Payer: Medicare HMO | Admitting: *Deleted

## 2014-10-18 ENCOUNTER — Ambulatory Visit (INDEPENDENT_AMBULATORY_CARE_PROVIDER_SITE_OTHER): Payer: Medicare HMO | Admitting: Internal Medicine

## 2014-10-18 VITALS — BP 122/82 | HR 108 | Ht 66.0 in | Wt 191.6 lb

## 2014-10-18 DIAGNOSIS — E119 Type 2 diabetes mellitus without complications: Secondary | ICD-10-CM

## 2014-10-18 DIAGNOSIS — I2510 Atherosclerotic heart disease of native coronary artery without angina pectoris: Secondary | ICD-10-CM | POA: Diagnosis not present

## 2014-10-18 DIAGNOSIS — I4892 Unspecified atrial flutter: Secondary | ICD-10-CM | POA: Diagnosis not present

## 2014-10-18 DIAGNOSIS — Z01812 Encounter for preprocedural laboratory examination: Secondary | ICD-10-CM

## 2014-10-18 LAB — CBC WITH DIFFERENTIAL/PLATELET
Basophils Absolute: 0 10*3/uL (ref 0.0–0.1)
Basophils Relative: 0.7 % (ref 0.0–3.0)
Eosinophils Absolute: 0.3 10*3/uL (ref 0.0–0.7)
Eosinophils Relative: 3.8 % (ref 0.0–5.0)
HCT: 38.2 % — ABNORMAL LOW (ref 39.0–52.0)
Hemoglobin: 12.8 g/dL — ABNORMAL LOW (ref 13.0–17.0)
Lymphocytes Relative: 27.7 % (ref 12.0–46.0)
Lymphs Abs: 2.1 10*3/uL (ref 0.7–4.0)
MCHC: 33.5 g/dL (ref 30.0–36.0)
MCV: 87.9 fl (ref 78.0–100.0)
Monocytes Absolute: 0.6 10*3/uL (ref 0.1–1.0)
Monocytes Relative: 7.7 % (ref 3.0–12.0)
Neutro Abs: 4.5 10*3/uL (ref 1.4–7.7)
Neutrophils Relative %: 60.1 % (ref 43.0–77.0)
Platelets: 277 10*3/uL (ref 150.0–400.0)
RBC: 4.34 Mil/uL (ref 4.22–5.81)
RDW: 13.9 % (ref 11.5–15.5)
WBC: 7.5 10*3/uL (ref 4.0–10.5)

## 2014-10-18 LAB — BASIC METABOLIC PANEL
BUN: 30 mg/dL — ABNORMAL HIGH (ref 6–23)
CO2: 28 mEq/L (ref 19–32)
Calcium: 9.3 mg/dL (ref 8.4–10.5)
Chloride: 97 mEq/L (ref 96–112)
Creatinine, Ser: 1.2 mg/dL (ref 0.40–1.50)
GFR: 64.29 mL/min (ref >60.00)
Glucose, Bld: 415 mg/dL — ABNORMAL HIGH (ref 70–99)
Potassium: 5 mEq/L (ref 3.5–5.1)
Sodium: 131 mEq/L — ABNORMAL LOW (ref 135–145)

## 2014-10-18 NOTE — Addendum Note (Signed)
Addended by: Eulis Foster on: 10/18/2014 11:49 AM   Modules accepted: Orders

## 2014-10-18 NOTE — Patient Instructions (Addendum)
Medication Instructions:  Your physician recommends that you continue on your current medications as directed. Please refer to the Current Medication list given to you today.   Labwork: Your physician recommends that you return for lab work in: TODAY (CBC with Dif)   Testing/Procedures: NONE  Follow-Up: Your physician recommends that you schedule a follow-up appointment in: 6 weeks (from 7/6) with Dr. Lovena Le.  Any Other Special Instructions Will Be Listed Below (If Applicable).

## 2014-10-19 ENCOUNTER — Other Ambulatory Visit: Payer: Medicare HMO

## 2014-10-19 NOTE — Assessment & Plan Note (Signed)
He has persistent atrial flutter. I have discussed the treatment options with the patient and his wife. Catheter ablation is indicated and I have discussed the risks/benefits/goals/expectations with the patient and he will call us if he wishes to proceed. His ECG from 7 months ago demonstrates an atypical flutter where as the ECG today is typical. If he has left atrial flutter, then we will have CARTO available.

## 2014-10-19 NOTE — Progress Notes (Signed)
HPI Matthew [name] is referred today for evaluation of atrial flutter. He presented several months ago with atrial flutter and underwent DCCV. He appears to have maintained NSR until several weeks ago when he developed recurrent palpitations. He feels dyspnea with any exertion although he has no rest symptoms. He has been placed on Eliquis for thromboembolic prevention. He is here today to consider catheter ablation.  Allergies  Allergen Reactions  . Actos [Pioglitazone] Swelling  . Bee Venom Swelling  . Codeine Nausea Only    REACTION: hives  . Statins   . Glucophage [Metformin Hcl] Nausea Only  . Raspberry Itching     Current Outpatient Prescriptions  Medication Sig Dispense Refill  . apixaban (ELIQUIS) 5 MG TABS tablet Take 1 tablet (5 mg total) by mouth 2 (two) times daily. 60 tablet 9  . bimatoprost (LUMIGAN) 0.01 % SOLN Place 1 drop into the left eye at bedtime.    . cholecalciferol (VITAMIN D) 1000 UNITS tablet Take 5,000 Units by mouth daily.    . Coenzyme Q10 (CO Q 10) 100 MG CAPS Take 100 mg by mouth 2 (two) times daily.    . furosemide (LASIX) 40 MG tablet Take 1 tablet (40 mg total) by mouth daily. 90 tablet 3  . Insulin Glargine (TOUJEO SOLOSTAR) 300 UNIT/ML SOPN Inject 26-39 Units into the skin daily.    . insulin lispro (HUMALOG) 100 UNIT/ML injection Inject 8-12 Units into the skin 3 (three) times daily before meals. Sliding scale    . lisinopril (PRINIVIL,ZESTRIL) 5 MG tablet Take 1 tablet (5 mg total) by mouth daily. 90 tablet 0  . metoprolol (LOPRESSOR) 50 MG tablet Take 75 mg by mouth 2 (two) times daily. Pt forgot that physician increased this medication and has only been taking 50 mg twice daily (10/18/14)    . potassium chloride (K-DUR) 10 MEQ tablet Take 1 tablet (10 mEq total) by mouth daily. 90 tablet 3  . TURMERIC PO Take 450 mg by mouth daily.      No current facility-administered medications for this visit.     Past Medical History  Diagnosis Date    . Diabetes mellitus without complication   . Testicular cancer   . Hyperlipidemia     a. Intolerant of lipitor and vytorin.  . Glaucoma   . Diabetic retinopathy     mild- Dr. Herbert Deaner  . Coronary artery disease     a. 05/2009 CABG x 3: LIMA->LAD, VG->OM, VG->PDA; b. Nuc 01/2014: inf-lat scar but no ischemia, EF 57%.  . Valvular heart disease     a. 01/2014 Echo: Ef 60-65%, no rwma, mild AI/MS, mod MR, mildly dil LA.  . Paroxysmal atrial flutter     a. 01/2014 s/p DCCV;  b. CHA2DS2VASc = 3-->Eliquis.    ROS:   All systems reviewed and negative except as noted in the HPI.   Past Surgical History  Procedure Laterality Date  . Cardiac bypass    . Elbow surgery    . Cardiac catheterization      2011  . Coronary artery bypass graft      LIMA-LAD, SVG-OM, SVG-PDA 06/20/09  . Cardioversion N/A 02/19/2014    Procedure: CARDIOVERSION;  Surgeon: [name], MD;  Location: [facility] ENDOSCOPY;  Service: Cardiovascular;  Laterality: N/A;  . Hernia repair      patient reports exploratory surgery for hernia, but didn't have one  . Other surgical history      broken ulna on left  elbow-surgical repair     Family History  Problem Relation Age of Onset  . Heart disease Father   . Heart attack Father   . Diabetes Mother   . Diabetes Father   . Diabetes Sister   . Hypertension Father      History   Social History  . Marital Status: Married    Spouse Name: N/A  . Number of Children: N/A  . Years of Education: N/A   Occupational History  . Not on file.   Social History Main Topics  . Smoking status: Former Research scientist (life sciences)  . Smokeless tobacco: Not on file  . Alcohol Use: 1.2 oz/week    2 Cans of beer per week     Comment: occasional  . Drug Use: No  . Sexual Activity: Not on file   Other Topics Concern  . Not on file   Social History Narrative     BP 122/82 mmHg  Pulse 108  Ht 5\' 6"  (1.676 m)  Wt 191 lb 9.6 oz (86.909 kg)  BMI 30.94 kg/m2  Physical Exam:  Well  appearing 69 yo man, looks younger than stated age, NAD HEENT: Unremarkable Neck:  6 JVD, no thyromegally Lymphatics:  No adenopathy Back:  No CVA tenderness Lungs:  Clear HEART:  Regular rate rhythm, no murmurs, no rubs, no clicks Abd:  soft, positive bowel sounds, no organomegally, no rebound, no guarding Ext:  2 plus pulses, no edema, no cyanosis, no clubbing Skin:  No rashes no nodules Neuro:  CN II through XII intact, motor grossly intact  EKG - atrial flutter with a RVR   Assess/Plan:

## 2014-10-20 ENCOUNTER — Telehealth: Payer: Self-pay | Admitting: Nurse Practitioner

## 2014-10-20 NOTE — Telephone Encounter (Signed)
   Lab Results  Component Value Date   CREATININE 1.20 10/18/2014   BUN 30* 10/18/2014   NA 131* 10/18/2014   K 5.0 10/18/2014   CL 97 10/18/2014   CO2 28 10/18/2014   I called Mr. Heiberger this AM to let him know about his lab results from 6/23.  His wt is back down to baseline.  BUN/Creat bumping.  K 5.0.  I rec that he hold lasix and only take if wt goes up >2 lbs in 24 hrs.  D/C potassium.  Caller verbalized understanding and was grateful for the call back.  Murray Hodgkins, NP 10/20/2014, 7:29 AM

## 2014-10-22 ENCOUNTER — Other Ambulatory Visit: Payer: Self-pay

## 2014-10-23 ENCOUNTER — Telehealth: Payer: Self-pay | Admitting: Internal Medicine

## 2014-10-23 NOTE — Telephone Encounter (Signed)
I have not called patient.  He is scheduled for 8/23 at 8:45

## 2014-10-23 NOTE — Telephone Encounter (Signed)
New message    Pt returning your call.  Please advise

## 2014-10-24 ENCOUNTER — Other Ambulatory Visit: Payer: Self-pay | Admitting: *Deleted

## 2014-10-26 ENCOUNTER — Ambulatory Visit: Payer: Medicare HMO | Admitting: Nurse Practitioner

## 2014-10-30 ENCOUNTER — Other Ambulatory Visit: Payer: Medicare HMO

## 2014-10-30 ENCOUNTER — Other Ambulatory Visit: Payer: Self-pay | Admitting: Endocrinology

## 2014-10-31 ENCOUNTER — Encounter (HOSPITAL_COMMUNITY)
Admission: RE | Disposition: A | Discharge status: Home / Self Care | Payer: Medicare HMO | Source: Ambulatory Visit | Attending: Internal Medicine

## 2014-10-31 ENCOUNTER — Ambulatory Visit (HOSPITAL_COMMUNITY)
Admission: RE | Admit: 2014-10-31 | Discharge: 2014-11-01 | Disposition: A | Discharge status: Home / Self Care | Payer: Medicare HMO | Source: Ambulatory Visit | Attending: Internal Medicine | Admitting: Internal Medicine

## 2014-10-31 ENCOUNTER — Encounter (HOSPITAL_COMMUNITY): Payer: Self-pay | Admitting: General Practice

## 2014-10-31 ENCOUNTER — Telehealth: Payer: Self-pay | Admitting: Internal Medicine

## 2014-10-31 DIAGNOSIS — Z951 Presence of aortocoronary bypass graft: Secondary | ICD-10-CM | POA: Diagnosis not present

## 2014-10-31 DIAGNOSIS — Z79899 Other long term (current) drug therapy: Secondary | ICD-10-CM | POA: Diagnosis not present

## 2014-10-31 DIAGNOSIS — E11319 Type 2 diabetes mellitus with unspecified diabetic retinopathy without macular edema: Secondary | ICD-10-CM | POA: Insufficient documentation

## 2014-10-31 DIAGNOSIS — I4892 Unspecified atrial flutter: Secondary | ICD-10-CM | POA: Diagnosis present

## 2014-10-31 DIAGNOSIS — H409 Unspecified glaucoma: Secondary | ICD-10-CM | POA: Insufficient documentation

## 2014-10-31 DIAGNOSIS — E785 Hyperlipidemia, unspecified: Secondary | ICD-10-CM | POA: Diagnosis not present

## 2014-10-31 DIAGNOSIS — Z8547 Personal history of malignant neoplasm of testis: Secondary | ICD-10-CM | POA: Diagnosis not present

## 2014-10-31 DIAGNOSIS — Z794 Long term (current) use of insulin: Secondary | ICD-10-CM | POA: Diagnosis not present

## 2014-10-31 DIAGNOSIS — I080 Rheumatic disorders of both mitral and aortic valves: Secondary | ICD-10-CM | POA: Diagnosis not present

## 2014-10-31 DIAGNOSIS — Z87891 Personal history of nicotine dependence: Secondary | ICD-10-CM | POA: Diagnosis not present

## 2014-10-31 DIAGNOSIS — I483 Typical atrial flutter: Secondary | ICD-10-CM | POA: Insufficient documentation

## 2014-10-31 DIAGNOSIS — I2510 Atherosclerotic heart disease of native coronary artery without angina pectoris: Secondary | ICD-10-CM | POA: Diagnosis not present

## 2014-10-31 DIAGNOSIS — Z7901 Long term (current) use of anticoagulants: Secondary | ICD-10-CM | POA: Insufficient documentation

## 2014-10-31 HISTORY — DX: Sacrococcygeal disorders, not elsewhere classified: M53.3

## 2014-10-31 HISTORY — DX: Migraine, unspecified, not intractable, without status migrainosus: G43.909

## 2014-10-31 HISTORY — DX: Heart failure, unspecified: I50.9

## 2014-10-31 HISTORY — PX: ELECTROPHYSIOLOGIC STUDY: SHX172A

## 2014-10-31 HISTORY — DX: Post-traumatic stress disorder, unspecified: F43.10

## 2014-10-31 HISTORY — DX: Type 2 diabetes mellitus without complications: E11.9

## 2014-10-31 HISTORY — DX: Acute myocardial infarction, unspecified: I21.9

## 2014-10-31 HISTORY — DX: Anemia, unspecified: D64.9

## 2014-10-31 HISTORY — DX: Pneumonia, unspecified organism: J18.9

## 2014-10-31 HISTORY — DX: Sciatica, right side: M54.31

## 2014-10-31 LAB — GLUCOSE, CAPILLARY
Glucose-Capillary: 313 mg/dL — ABNORMAL HIGH (ref 65–99)
Glucose-Capillary: 278 mg/dL — ABNORMAL HIGH (ref 65–99)
Glucose-Capillary: 302 mg/dL — ABNORMAL HIGH (ref 65–99)

## 2014-10-31 SURGERY — A-FLUTTER/A-TACH/SVT ABLATION

## 2014-10-31 MED ORDER — INSULIN ASPART 100 UNIT/ML ~~LOC~~ SOLN
0.0000 [IU] | Freq: Three times a day (TID) | SUBCUTANEOUS | Status: DC
  Administered 2014-11-01: 8 [IU] via SUBCUTANEOUS

## 2014-10-31 MED ORDER — HEPARIN (PORCINE) IN NACL 2-0.9 UNIT/ML-% IJ SOLN
INTRAMUSCULAR | Status: AC
  Filled 2014-10-31: qty 500

## 2014-10-31 MED ORDER — SODIUM CHLORIDE 0.9 % IJ SOLN: 3.0000 mL | INTRAMUSCULAR | Status: DC | PRN

## 2014-10-31 MED ORDER — MIDAZOLAM HCL 5 MG/5ML IJ SOLN
INTRAMUSCULAR | Status: AC
  Filled 2014-10-31: qty 5

## 2014-10-31 MED ORDER — SODIUM CHLORIDE 0.9 % IV SOLN: 250.0000 mL | INTRAVENOUS | Status: DC | PRN

## 2014-10-31 MED ORDER — METOPROLOL TARTRATE 25 MG PO TABS
75.0000 mg | ORAL_TABLET | Freq: Two times a day (BID) | ORAL | Status: DC
  Administered 2014-10-31: 75 mg via ORAL
  Filled 2014-10-31 (×2): qty 3

## 2014-10-31 MED ORDER — BUPIVACAINE HCL (PF) 0.25 % IJ SOLN
INTRAMUSCULAR | Status: AC
  Filled 2014-10-31: qty 30

## 2014-10-31 MED ORDER — SODIUM CHLORIDE 0.9 % IJ SOLN
3.0000 mL | Freq: Two times a day (BID) | INTRAMUSCULAR | Status: DC
Start: 2014-10-31 — End: 2014-11-01
  Administered 2014-10-31: 3 mL via INTRAVENOUS

## 2014-10-31 MED ORDER — FENTANYL CITRATE (PF) 100 MCG/2ML IJ SOLN
INTRAMUSCULAR | Status: DC | PRN
  Administered 2014-10-31 (×4): 12.5 ug via INTRAVENOUS
  Administered 2014-10-31: 25 ug via INTRAVENOUS
  Administered 2014-10-31: 12.5 ug via INTRAVENOUS

## 2014-10-31 MED ORDER — VITAMIN D 1000 UNITS PO TABS
5000.0000 [IU] | ORAL_TABLET | Freq: Every day | ORAL | Status: DC
  Administered 2014-10-31: 19:00:00 5000 [IU] via ORAL
  Filled 2014-10-31 (×2): qty 5

## 2014-10-31 MED ORDER — POTASSIUM CHLORIDE ER 10 MEQ PO TBCR
10.0000 meq | EXTENDED_RELEASE_TABLET | Freq: Every day | ORAL | Status: DC
  Filled 2014-10-31 (×2): qty 1

## 2014-10-31 MED ORDER — ONDANSETRON HCL 4 MG/2ML IJ SOLN: 4.0000 mg | Freq: Four times a day (QID) | INTRAMUSCULAR | Status: DC | PRN

## 2014-10-31 MED ORDER — APIXABAN 5 MG PO TABS
5.0000 mg | ORAL_TABLET | Freq: Two times a day (BID) | ORAL | Status: DC
  Administered 2014-10-31: 5 mg via ORAL
  Filled 2014-10-31 (×3): qty 1

## 2014-10-31 MED ORDER — MIDAZOLAM HCL 5 MG/5ML IJ SOLN
INTRAMUSCULAR | Status: DC | PRN
  Administered 2014-10-31 (×3): 1 mg via INTRAVENOUS
  Administered 2014-10-31: 2 mg via INTRAVENOUS
  Administered 2014-10-31 (×2): 1 mg via INTRAVENOUS

## 2014-10-31 MED ORDER — FUROSEMIDE 40 MG PO TABS
40.0000 mg | ORAL_TABLET | Freq: Every day | ORAL | Status: DC
  Filled 2014-10-31: qty 1

## 2014-10-31 MED ORDER — ACETAMINOPHEN 325 MG PO TABS: 650.0000 mg | ORAL_TABLET | ORAL | Status: DC | PRN

## 2014-10-31 MED ORDER — FENTANYL CITRATE (PF) 100 MCG/2ML IJ SOLN
INTRAMUSCULAR | Status: AC
  Filled 2014-10-31: qty 2

## 2014-10-31 SURGICAL SUPPLY — 14 items
BAG SNAP BAND KOVER 36X36 (MISCELLANEOUS) ×1 IMPLANT
CATH EZ STEER NAV 8MM F-J CUR (ABLATOR) ×1 IMPLANT
CATH JOSEPHSON QUAD-ALLRED 6FR (CATHETERS) ×1 IMPLANT
CATH POLARIS X 2.5/5/2.5 DECAP (CATHETERS) ×1 IMPLANT
INTRODUCER SWARTZ SRO 8F (SHEATH) ×1 IMPLANT
PACK EP LATEX FREE (CUSTOM PROCEDURE TRAY) ×2
PACK EP LF (CUSTOM PROCEDURE TRAY) IMPLANT
PAD DEFIB LIFELINK (PAD) ×1 IMPLANT
PATCH CARTO3 (PAD) ×1 IMPLANT
SHEATH PINNACLE 6F 10CM (SHEATH) ×2 IMPLANT
SHEATH PINNACLE 7F 10CM (SHEATH) ×1 IMPLANT
SHEATH PINNACLE 8F 10CM (SHEATH) ×1 IMPLANT
SHEATH PINNACLE 9F 10CM (SHEATH) ×1 IMPLANT
SHIELD RADPAD SCOOP 12X17 (MISCELLANEOUS) ×1 IMPLANT

## 2014-10-31 NOTE — Interval H&P Note (Signed)
History and Physical Interval Note:  10/31/2014 2:36 PM  Matthew [name]  has presented today for surgery, with the diagnosis of aflutter  The various methods of treatment have been discussed with the patient and family. After consideration of risks, benefits and other options for treatment, the patient has consented to  Procedure(s): A-Flutter (N/A) as a surgical intervention .  The patient's history has been reviewed, patient examined, no change in status, stable for surgery.  I have reviewed the patient's chart and labs.  Questions were answered to the patient's satisfaction.     [name]

## 2014-10-31 NOTE — Telephone Encounter (Signed)
Pt is scheduled for A-flutter ablation this afternoon. His blood sugar this morning is 409. Pt wants to know if he can take Humalog insulins SQ. He thinks that if he takes 4 or 5 units of insuline SQ his sugar level will go down to the 200's by the time he has the ablation. Alvis Lemmings RN send a message to [facility] NP , she okay for pt to give his own insuline prior going to the hospital for the ablation today. Pt is aware he verbalized understanding.

## 2014-10-31 NOTE — H&P (View-Only) (Signed)
HPI [name] is referred today for evaluation of atrial flutter. He presented several months ago with atrial flutter and underwent DCCV. He appears to have maintained NSR until several weeks ago when he developed recurrent palpitations. He feels dyspnea with any exertion although he has no rest symptoms. He has been placed on Eliquis for thromboembolic prevention. He is here today to consider catheter ablation.  Allergies  Allergen Reactions  . Actos [Pioglitazone] Swelling  . Bee Venom Swelling  . Codeine Nausea Only    REACTION: hives  . Statins   . Glucophage [Metformin Hcl] Nausea Only  . Raspberry Itching     Current Outpatient Prescriptions  Medication Sig Dispense Refill  . apixaban (ELIQUIS) 5 MG TABS tablet Take 1 tablet (5 mg total) by mouth 2 (two) times daily. 60 tablet 9  . bimatoprost (LUMIGAN) 0.01 % SOLN Place 1 drop into the left eye at bedtime.    . cholecalciferol (VITAMIN D) 1000 UNITS tablet Take 5,000 Units by mouth daily.    . Coenzyme Q10 (CO Q 10) 100 MG CAPS Take 100 mg by mouth 2 (two) times daily.    . furosemide (LASIX) 40 MG tablet Take 1 tablet (40 mg total) by mouth daily. 90 tablet 3  . Insulin Glargine (TOUJEO SOLOSTAR) 300 UNIT/ML SOPN Inject 26-39 Units into the skin daily.    . insulin lispro (HUMALOG) 100 UNIT/ML injection Inject 8-12 Units into the skin 3 (three) times daily before meals. Sliding scale    . lisinopril (PRINIVIL,ZESTRIL) 5 MG tablet Take 1 tablet (5 mg total) by mouth daily. 90 tablet 0  . metoprolol (LOPRESSOR) 50 MG tablet Take 75 mg by mouth 2 (two) times daily. Pt forgot that physician increased this medication and has only been taking 50 mg twice daily (10/18/14)    . potassium chloride (K-DUR) 10 MEQ tablet Take 1 tablet (10 mEq total) by mouth daily. 90 tablet 3  . TURMERIC PO Take 450 mg by mouth daily.      No current facility-administered medications for this visit.     Past Medical History  Diagnosis Date    . Diabetes mellitus without complication   . Testicular cancer   . Hyperlipidemia     a. Intolerant of lipitor and vytorin.  . Glaucoma   . Diabetic retinopathy     mild- Dr. Herbert Deaner  . Coronary artery disease     a. 05/2009 CABG x 3: LIMA->LAD, VG->OM, VG->PDA; b. Nuc 01/2014: inf-lat scar but no ischemia, EF 57%.  . Valvular heart disease     a. 01/2014 Echo: Ef 60-65%, no rwma, mild AI/MS, mod MR, mildly dil LA.  . Paroxysmal atrial flutter     a. 01/2014 s/p DCCV;  b. CHA2DS2VASc = 3-->Eliquis.    ROS:   All systems reviewed and negative except as noted in the HPI.   Past Surgical History  Procedure Laterality Date  . Cardiac bypass    . Elbow surgery    . Cardiac catheterization      2011  . Coronary artery bypass graft      LIMA-LAD, SVG-OM, SVG-PDA 06/20/09  . Cardioversion N/A 02/19/2014    Procedure: CARDIOVERSION;  Surgeon: [name], MD;  Location: [facility] ENDOSCOPY;  Service: Cardiovascular;  Laterality: N/A;  . Hernia repair      patient reports exploratory surgery for hernia, but didn't have one  . Other surgical history      broken ulna on left  elbow-surgical repair     Family History  Problem Relation Age of Onset  . Heart disease Father   . Heart attack Father   . Diabetes Mother   . Diabetes Father   . Diabetes Sister   . Hypertension Father      History   Social History  . Marital Status: Married    Spouse Name: N/A  . Number of Children: N/A  . Years of Education: N/A   Occupational History  . Not on file.   Social History Main Topics  . Smoking status: Former Research scientist (life sciences)  . Smokeless tobacco: Not on file  . Alcohol Use: 1.2 oz/week    2 Cans of beer per week     Comment: occasional  . Drug Use: No  . Sexual Activity: Not on file   Other Topics Concern  . Not on file   Social History Narrative     BP 122/82 mmHg  Pulse 108  Ht 5\' 6"  (1.676 m)  Wt 191 lb 9.6 oz (86.909 kg)  BMI 30.94 kg/m2  Physical Exam:  Well  appearing 69 yo man, looks younger than stated age, NAD HEENT: Unremarkable Neck:  6 JVD, no thyromegally Lymphatics:  No adenopathy Back:  No CVA tenderness Lungs:  Clear HEART:  Regular rate rhythm, no murmurs, no rubs, no clicks Abd:  soft, positive bowel sounds, no organomegally, no rebound, no guarding Ext:  2 plus pulses, no edema, no cyanosis, no clubbing Skin:  No rashes no nodules Neuro:  CN II through XII intact, motor grossly intact  EKG - atrial flutter with a RVR   Assess/Plan:

## 2014-10-31 NOTE — Telephone Encounter (Signed)
New message      Pt is scheduled for an ablation this afternoon.  His blood sugar is 409. Please advise.

## 2014-11-01 ENCOUNTER — Encounter (HOSPITAL_COMMUNITY): Payer: Self-pay | Admitting: Internal Medicine

## 2014-11-01 DIAGNOSIS — I483 Typical atrial flutter: Secondary | ICD-10-CM | POA: Diagnosis not present

## 2014-11-01 DIAGNOSIS — H409 Unspecified glaucoma: Secondary | ICD-10-CM | POA: Diagnosis not present

## 2014-11-01 DIAGNOSIS — E785 Hyperlipidemia, unspecified: Secondary | ICD-10-CM | POA: Diagnosis not present

## 2014-11-01 DIAGNOSIS — E11319 Type 2 diabetes mellitus with unspecified diabetic retinopathy without macular edema: Secondary | ICD-10-CM | POA: Diagnosis not present

## 2014-11-01 LAB — GLUCOSE, CAPILLARY
Glucose-Capillary: 171 mg/dL — ABNORMAL HIGH (ref 65–99)
Glucose-Capillary: 286 mg/dL — ABNORMAL HIGH (ref 65–99)

## 2014-11-01 MED ORDER — METOPROLOL TARTRATE 50 MG PO TABS: 50.0000 mg | ORAL_TABLET | Freq: Two times a day (BID) | ORAL | Status: DC

## 2014-11-01 NOTE — Discharge Summary (Signed)
Physician Discharge Summary     Cardiologist: Lovena Le Patient ID: Amjad L Fasnacht MRN: [id number] DOB/AGE: 1945/10/26 69 y.o.  Admit date: 10/31/2014 Discharge date: 11/01/2014  Admission Diagnoses:  Atrial flutter  Discharge Diagnoses:  Active Problems:   Paroxysmal atrial flutter   Atrial flutter   Discharged Condition: stable  Hospital Course:   Mr. Thibault is a 69 yo male referred for evaluation of atrial flutter. He presented several months ago with atrial flutter and underwent DCCV. He appears to have maintained NSR until several weeks ago when he developed recurrent palpitations. He feels dyspnea with any exertion although he has no rest symptoms. He has been placed on Eliquis for thromboembolic prevention. He presented for EP study and underwent successful radiofrequency ablation of atrial flutter along the cavotricuspid isthmus with complete bidirectional isthmus block achieved. No inducible arrhythmias following ablation.  The patient was seen by Dr. Lovena Le who felt he was stable for DC home. CHA2DS2VASc = 3   Consults: None  Significant Diagnostic Studies:  EP study and catheter ablation  CONCLUSIONS:  1. Isthmus-dependent right atrial flutter upon presentation.  2. Successful radiofrequency ablation of atrial flutter along the cavotricuspid isthmus with complete bidirectional isthmus block achieved.  3. No inducible arrhythmias following ablation.  4. No early apparent complications.   [name], MD   Treatments: See above  Discharge Exam: Blood pressure 155/77, pulse 89, temperature 97.8 F (36.6 C), temperature source Oral, resp. rate 20, height 5\' 6"  (1.676 m), weight 195 lb 8.8 oz (88.7 kg), SpO2 96 %.   Disposition: 01-Home or Self Care      Discharge Instructions    Diet - low sodium heart healthy    Complete by:  As directed      Discharge instructions    Complete by:  As directed   No racquetball until July 16.     Increase activity  slowly    Complete by:  As directed             Medication List    TAKE these medications        apixaban 5 MG Tabs tablet  Commonly known as:  ELIQUIS  Take 1 tablet (5 mg total) by mouth 2 (two) times daily.     bimatoprost 0.01 % Soln  Commonly known as:  LUMIGAN  Place 1 drop into the left eye at bedtime.     cholecalciferol 1000 UNITS tablet  Commonly known as:  VITAMIN D  Take 5,000 Units by mouth daily.     Co Q 10 100 MG Caps  Take 100 mg by mouth 2 (two) times daily.     furosemide 40 MG tablet  Commonly known as:  LASIX  Take 1 tablet (40 mg total) by mouth daily.     insulin lispro 100 UNIT/ML injection  Commonly known as:  HUMALOG  Inject 8-12 Units into the skin 3 (three) times daily before meals. Sliding scale     lisinopril 5 MG tablet  Commonly known as:  PRINIVIL,ZESTRIL  TAKE 1 TABLET(5 MG) BY MOUTH DAILY     metoprolol 50 MG tablet  Commonly known as:  LOPRESSOR  Take 75 mg by mouth 2 (two) times daily. Pt forgot that physician increased this medication and has only been taking 50 mg twice daily (10/18/14)     potassium chloride 10 MEQ tablet  Commonly known as:  K-DUR  Take 1 tablet (10 mEq total) by mouth daily.     TOUJEO SOLOSTAR 300 UNIT/ML  Sopn  Generic drug:  Insulin Glargine  Inject 26-39 Units into the skin daily. Take according to what you eat     TURMERIC PO  Take 450 mg by mouth daily.       Follow-up Information    Follow up with [name], MD On 12/18/2014.   Specialty:  Cardiology   Why:  8:45 AM   Contact information:   [address] [address] [phone]       Follow up with EKG On 11/22/2014.   Why:  11:00 AM   Contact information:   [address] [place] [address] [phone]     Greater than 30 minutes was spent completing the patient's discharge.    SignedTarri Fuller, [place] 11/01/2014, 8:23 AM  EP Attending  Patient seen and examined. Agree with  above. I have asked him to reduce his metoprolol to 50 twice daily. He is stable for discharge. Will ask him to take his systemic anti-coagulation for 3 more weeks, come back for an ECG and if in NSR, can stop his Eliquis.   Mikle Bosworth.D.

## 2014-11-01 NOTE — Progress Notes (Signed)
Inpatient Diabetes Program Recommendations  AACE/ADA: New Consensus Statement on Inpatient Glycemic Control (2013)  Target Ranges:  Prepandial:   less than 140 mg/dL      Peak postprandial:   less than 180 mg/dL (1-2 hours)      Critically ill patients:  140 - 180 mg/dL   Reason for Visit: Diabetes Consult  Diabetes history: DM2 Outpatient Diabetes medications: Toujeo 26-39 units QHS, Humalog 12 units tidwc Current orders for Inpatient glycemic control: Novolog moderate tidwc  69 year old male admitted with atrial flutter. Hx DM2. Had ablation. To be discharged today. Pt states he checks blood sugars one time/day - "I should check more often." Has appt with CDE at Dr. Ronnie Derby office within the next couple weeks.  Results for CALIX, HEINBAUGH (MRN [id number]) as of 11/01/2014 09:55  Ref. Range 10/31/2014 11:03 10/31/2014 12:06 10/31/2014 17:29 10/31/2014 21:10 11/01/2014 06:19  Glucose-Capillary Latest Ref Range: 65-99 mg/dL 313 (H) 278 (H) 171 (H) 302 (H) 286 (H)  Results for DRAYCEN, LEICHTER (MRN [id number]) as of 11/01/2014 09:55  Ref. Range 06/13/2014 11:25 08/27/2014 09:46  Hemoglobin A1C Latest Ref Range: 4.6-6.5 % 10.9 (H) 8.7 (H)   HgbA1C improving but remains elevated. Discussed importance of monitoring blood sugars more frequently. To follow-up with CDE and endocrinologist. Discussed with RN.  Thank you. Lorenda Peck, RD, LDN, CDE Inpatient Diabetes Coordinator [phone]

## 2014-11-02 ENCOUNTER — Ambulatory Visit: Payer: Medicare HMO | Admitting: Endocrinology

## 2014-11-02 MED FILL — Heparin Sodium (Porcine) 2 Unit/ML in Sodium Chloride 0.9%: INTRAMUSCULAR | Qty: 500 | Status: AC

## 2014-11-02 MED FILL — Bupivacaine HCl Preservative Free (PF) Inj 0.25%: INTRAMUSCULAR | Qty: 60 | Status: AC

## 2014-11-13 ENCOUNTER — Ambulatory Visit: Payer: Medicare HMO | Admitting: *Deleted

## 2014-11-14 ENCOUNTER — Encounter: Payer: Self-pay | Admitting: *Deleted

## 2014-11-14 ENCOUNTER — Encounter: Payer: Medicare HMO | Attending: Endocrinology | Admitting: *Deleted

## 2014-11-14 VITALS — Ht 66.0 in | Wt 194.0 lb

## 2014-11-14 DIAGNOSIS — Z713 Dietary counseling and surveillance: Secondary | ICD-10-CM | POA: Insufficient documentation

## 2014-11-14 DIAGNOSIS — Z6831 Body mass index (BMI) 31.0-31.9, adult: Secondary | ICD-10-CM | POA: Diagnosis not present

## 2014-11-14 DIAGNOSIS — E119 Type 2 diabetes mellitus without complications: Secondary | ICD-10-CM | POA: Insufficient documentation

## 2014-11-15 NOTE — Progress Notes (Signed)
Professional Dexcom Set Up on 11/14/14 Time arrived:0830  Time left: 0900 Patient here for placement of Dexcom Continuous Glucose Monitor   Explained to patient purpose of wearing the Dexcom per MD orders. They expressed verbal understanding.  Sensor inserted into skin at least 2 inches from any insulin injection sites. Patient instructed to check BG 2 times each day to calibrate. Explained to patient how to input onto [facility] Receiver all BG, food intake, exercise and any diabetes medications.  Patient instructed to return on Tuesday, 7/26 for removal of sensor and Dexcom  Dexcom transmitter attached to sensor and taped down.  Patient informed that when Dexcom and sensor are connected, the system is waterproof and that they are to wear it consistently until they return to this office.  Patient to call this office if any questions or concerns prior to appointment to return the [facility] for downloading.

## 2014-11-22 ENCOUNTER — Ambulatory Visit (INDEPENDENT_AMBULATORY_CARE_PROVIDER_SITE_OTHER): Payer: Medicare HMO | Admitting: *Deleted

## 2014-11-22 VITALS — BP 151/75 | HR 85 | Ht 66.0 in | Wt 196.0 lb

## 2014-11-22 DIAGNOSIS — I4892 Unspecified atrial flutter: Secondary | ICD-10-CM | POA: Diagnosis not present

## 2014-11-22 NOTE — Progress Notes (Signed)
1.) Reason for visit: EKG post A-flutter ablation  2.) Name of MD requesting visit: Dr Lovena Le  3.) H&P: Paroxysmal atrial flutter  4.) ROS related to problem: had A flutter ablation on 10/31/14  5.) Assessment and plan per MD: EKG done read by Dr.Klein MD. Pt is in SR 85 beats/minute. Pt is taking Metoprolol 50 mg twice a day. Pt will stop his Eliquis medication his Eliquis medication today. Pt is aware.

## 2014-11-22 NOTE — Patient Instructions (Signed)
Pt is aware to stop taking his Eliquis today.

## 2014-12-18 ENCOUNTER — Ambulatory Visit (INDEPENDENT_AMBULATORY_CARE_PROVIDER_SITE_OTHER): Payer: Medicare HMO | Admitting: Internal Medicine

## 2014-12-18 ENCOUNTER — Encounter: Payer: Self-pay | Admitting: Internal Medicine

## 2014-12-18 VITALS — BP 130/74 | HR 84 | Ht 66.0 in | Wt 198.2 lb

## 2014-12-18 DIAGNOSIS — E7800 Pure hypercholesterolemia, unspecified: Secondary | ICD-10-CM

## 2014-12-18 DIAGNOSIS — I4892 Unspecified atrial flutter: Secondary | ICD-10-CM | POA: Diagnosis not present

## 2014-12-18 DIAGNOSIS — I2510 Atherosclerotic heart disease of native coronary artery without angina pectoris: Secondary | ICD-10-CM

## 2014-12-18 DIAGNOSIS — I2583 Coronary atherosclerosis due to lipid rich plaque: Secondary | ICD-10-CM

## 2014-12-18 NOTE — Patient Instructions (Signed)
Medication Instructions:  Your physician recommends that you continue on your current medications as directed. Please refer to the Current Medication list given to you today.   Labwork: None ordered  Testing/Procedures: None ordered  Follow-Up: Your physician wants you to follow-up AS NEEDED.

## 2014-12-19 NOTE — Progress Notes (Signed)
HPI [name] returns today for followup. He is a pleasant 69 yo man with a h/o atrial flutter who underwent catheter ablation several weeks ago. He has done well in the interim with no chest pain or sob. No palpitations. Allergies  Allergen Reactions  . Actos [Pioglitazone] Swelling  . Bee Venom Swelling  . Codeine Nausea Only    REACTION: hives  . Statins   . Glucophage [Metformin Hcl] Nausea Only  . Raspberry Itching     Current Outpatient Prescriptions  Medication Sig Dispense Refill  . bimatoprost (LUMIGAN) 0.01 % SOLN Place 1 drop into the left eye at bedtime.    . cholecalciferol (VITAMIN D) 1000 UNITS tablet Take 5,000 Units by mouth daily.    . Coenzyme Q10 (CO Q 10) 100 MG CAPS Take 1 capsule by mouth daily.     . Insulin Glargine (TOUJEO SOLOSTAR) 300 UNIT/ML SOPN Inject 26-39 Units into the skin daily. Take according to what you eat    . insulin lispro (HUMALOG) 100 UNIT/ML injection Inject 8-12 Units into the skin 3 (three) times daily before meals. Sliding scale    . lisinopril (PRINIVIL,ZESTRIL) 5 MG tablet TAKE 1 TABLET(5 MG) BY MOUTH DAILY 90 tablet 1  . metoprolol (LOPRESSOR) 50 MG tablet Take 1 tablet (50 mg total) by mouth 2 (two) times daily. Pt forgot that physician increased this medication and has only been taking 50 mg twice daily (10/18/14) 60 tablet 5  . TURMERIC PO Take 450 mg by mouth daily.      No current facility-administered medications for this visit.     Past Medical History  Diagnosis Date  . Hyperlipidemia     a. Intolerant of lipitor and vytorin.  . Glaucoma   . Diabetic retinopathy     mild- Dr. Herbert Deaner  . Coronary artery disease     a. 05/2009 CABG x 3: LIMA->LAD, VG->OM, VG->PDA; b. Nuc 01/2014: inf-lat scar but no ischemia, EF 57%.  . Valvular heart disease     a. 01/2014 Echo: Ef 60-65%, no rwma, mild AI/MS, mod MR, mildly dil LA.  . Paroxysmal atrial flutter     a. 01/2014 s/p DCCV;  b. CHA2DS2VASc = 3-->Eliquis.  .  Testicular cancer 1982    "heavy doses of radiation; it was stage 2"  . CHF (congestive heart failure)   . Myocardial infarction     'saw evidence of it on an EKG done in 2005"  . Type II diabetes mellitus   . Pneumonia 1960  . Anemia   . Migraine     "once or twice" (10/31/2014)  . Sacral pain     "right"  . Sciatica of right side   . PTSD (post-traumatic stress disorder)     "X 2 yr post OHS" (10/31/2014)    ROS:   All systems reviewed and negative except as noted in the HPI.   Past Surgical History  Procedure Laterality Date  . Elbow fracture surgery Left     broken ulna on left elbow-surgical repair  . Cardiac catheterization  2011  . Coronary artery bypass graft  2011    LIMA-LAD, SVG-OM, SVG-PDA 06/20/09  . Cardioversion N/A 02/19/2014    Procedure: CARDIOVERSION;  Surgeon: [name], MD;  Location: [facility] ENDOSCOPY;  Service: Cardiovascular;  Laterality: N/A;  . Abdominal exploration surgery  ~ 2005    "for hernia, but didn't have one"  . Fracture surgery    . Tonsillectomy  ~ 1967  .  Testicle removal Right 1982  . Electrophysiologic study N/A 10/31/2014    Procedure: A-Flutter;  Surgeon: [name], MD;  Location: [place] [facility] LAB;  Service: Cardiovascular;  Laterality: N/A;     Family History  Problem Relation Age of Onset  . Heart disease Father   . Heart attack Father   . Diabetes Mother   . Diabetes Father   . Diabetes Sister   . Hypertension Father      Social History   Social History  . Marital Status: Married    Spouse Name: N/A  . Number of Children: N/A  . Years of Education: N/A   Occupational History  . Not on file.   Social History Main Topics  . Smoking status: Former Smoker -- 0.00 packs/day for 0 years    Types: Cigarettes  . Smokeless tobacco: Never Used     Comment: "quit smoking cigarettes in the 1980's; don't know how much or for how long"  . Alcohol Use: 3.6 oz/week    6 Cans of beer per week  . Drug Use: No  .  Sexual Activity: Yes    Birth Control/ Protection: Coitus interruptus   Other Topics Concern  . Not on file   Social History Narrative     BP 130/74 mmHg  Pulse 84  Ht 5\' 6"  (1.676 m)  Wt 198 lb 3.2 oz (89.903 kg)  BMI 32.01 kg/m2  Physical Exam:  Well appearing 69 yo woman, NAD HEENT: Unremarkable Neck:  7 cm JVD, no thyromegally Back:  No CVA tenderness Lungs:  Clear with no wheezes HEART:  Regular rate rhythm, no murmurs, no rubs, no clicks Abd:  soft, positive bowel sounds, no organomegally, no rebound, no guarding Ext:  2 plus pulses, no edema, no cyanosis, no clubbing Skin:  No rashes no nodules Neuro:  CN II through XII intact, motor grossly intact  EKG - NSR   Assess/Plan:

## 2014-12-19 NOTE — Assessment & Plan Note (Signed)
He is encouraged to maintain a low fat diet. No change in meds.

## 2014-12-19 NOTE — Assessment & Plan Note (Signed)
He is maintaining NSR. He has come off of his Eliquis. He will undergo watchful waiting.

## 2014-12-19 NOTE — Assessment & Plan Note (Signed)
He denies anginal symptoms. No change in meds.  

## 2014-12-21 ENCOUNTER — Encounter: Payer: Self-pay | Admitting: Endocrinology

## 2014-12-21 ENCOUNTER — Ambulatory Visit (INDEPENDENT_AMBULATORY_CARE_PROVIDER_SITE_OTHER): Payer: Medicare HMO | Admitting: Endocrinology

## 2014-12-21 VITALS — BP 138/84 | HR 81 | Temp 98.1°F | Resp 14 | Ht 66.0 in | Wt 197.4 lb

## 2014-12-21 DIAGNOSIS — E785 Hyperlipidemia, unspecified: Secondary | ICD-10-CM | POA: Diagnosis not present

## 2014-12-21 DIAGNOSIS — E1165 Type 2 diabetes mellitus with hyperglycemia: Secondary | ICD-10-CM

## 2014-12-21 DIAGNOSIS — IMO0002 Reserved for concepts with insufficient information to code with codable children: Secondary | ICD-10-CM

## 2014-12-21 LAB — POCT GLYCOSYLATED HEMOGLOBIN (HGB A1C): Hemoglobin A1C: 8.8

## 2014-12-21 MED ORDER — INSULIN ISOPHANE HUMAN 100 UNIT/ML KWIKPEN: PEN_INJECTOR | SUBCUTANEOUS | Status: DC

## 2014-12-21 NOTE — Progress Notes (Signed)
Patient ID: Matthew [name], male   DOB: 1945/08/27, 69 y.o.   MRN: [id number]           Reason for Appointment: Followup for Type 2 Diabetes  Referring physician: Criss Rosales  History of Present Illness:          Diagnosis: Type 2 diabetes mellitus, date of diagnosis:  1992      Past history: His blood sugar was high at diagnosis when he was having a routine screening done. He was started on Glucophage initially which he took for about a year. He thinks it did not help his sugar much and he did not feel good with it Apparently he was trying to control his diabetes with diet and exercise for a few years. He may have tried Glucophage again before going on insulin. Also was started on Actos which caused swelling He has been treated mostly with premixed insulin for about 15 years and his level of control appears to be inadequate although details of previous treatment are not available. He may have taken Humalog at one time for use with high sugars A1c was 11.5 in 2011 On his initial consultation in 9/15 because of poor control with premixed insulin he was switched to Levemir twice a day and Humalog with meals.  Recent history:lows acl   INSULIN regimen is described as: Toujeo 28 hs; Humalog 12-14 units 3 times a day with meals   He had a continuous glucose monitoring recording done for analysis of his blood sugar patterns but he did not come back for follow-up after he did this about a month ago Again his A1c is still consistently running high at 8.8% On his last visits he was advised to start checking his blood sugars more consistently at various times and he still does not do so   He still continues to have difficulty with his control with the following blood sugar patterns and problems:   For some reason his blood sugars mostly in the mornings and overall does not check his blood sugar daily also  His blood sugars are mostly high in the morning but has a couple of normal to low readings also  which she cannot explain.  Blood sugars are trending lower towards the evening with mostly low normal readings but has a couple of sporadically high readings also   He does not adjust his Humalog based on his meal size or fat intake; does periodically have high fat meals.  He has not checked any readings after supper recently   His continuous glucose monitoring indicates mostly high readings through the night on those days along with significantly high readings after supper   He has seen the diabetes educator for instruction but continues not to change his day-to-day management  He now says that he tends to get hypoglycemic 3-4 times a week in the afternoon or early evening and does not always check his blood sugar when this happens.  Blood sugars are generally lower on the days he is exercising in the mornings or early afternoon sometimes With hypoglycemia he has symptoms of weakness, sweating, shakiness and confusion Treating low sugars with orange juice       Oral hypoglycemic drugs the patient is taking are: None      Side effects from medications have been: Edema from Actos,? Nausea and malaise from metformin Compliance with the medical regimen: Fair  Glucose monitoring:  done ?  one time a day         Glucometer:  One Touch.      Blood Glucose readings by download  Mean values apply above for all meters except median for One Touch  PRE-MEAL Fasting Lunch  4-5 PM   7-9 PM  Overall  Glucose range:  46-366   98   71, 73   47-347    Mean/median:  215    79   98+/-110     CGM RECORD INTERPRETATION    Dates of Recording: 11/14/14 through 11/20/14  Sensor  summary:  Average blood glucose for the period of recording is 198+/-59 with standard deviation 59. 87% of readings are higher than target and only 13% in target, no low sugars       Glycemic patterns:  Hyperglycemic episodes are occurring periodically throughout the day  Hypoglycemic episodes are not occurring     Overnight  periods:   his blood sugars were markedly high on 4 of the nights that he had a recording available with some increase after about midnight-1 AM  Blood sugars are trending near-normal only on 7/24 and somewhat better on 7/23 also      Preprandial periods:   blood sugars are significantly high on most of the days in the  morning hours between at least 80 M-10 AM.  He did not keep any food diary and not clear what times he had breakfast Blood sugars between noon and 1 PM are fairly good to low normal on 7/20 and 7/25 but mostly higher on the other days especially on 7/22      Postprandial periods:   again difficult to know which times he was eating his meals; he has one apparent spike at breakfast and also the same day after lunch After dinner his blood sugars are significantly higher on all of the monitoring days except 2 days with blood sugar peaking just over 300 on 7/21.  Again no food diary was maintained or brought to  To be correlated with his blood sugars      Hypoglycemia:  none      Recommendations  he does need significant higher amounts of basal insulin through the night on most of the nights heading into late morning.  Blood sugars are mostly better between about 12 noon-4 PM without hypoglycemia and may be related to exercise or mealtime insulin.  He also does need better control of his postprandial readings after supper when blood sugars are going up over 200 on at least 3 or 4 of his meals and sometimes stay consistently higher into the night     Self-care:   Meals: 3 meals per day.  Bfst 8:30, 1 pm Dinner is about 8 PM.  Some meals are high fat      Exercise: Racquetball about 3-4 days a week mostly in morning before breakfast , sometimes late afternoon          Dietician visit, most recent: 15 years ago.     CDE visit: 3/16            Weight history:  Wt Readings from Last 3 Encounters:  12/21/14 197 lb 6.4 oz (89.54 kg)  12/18/14 198 lb 3.2 oz (89.903 kg)  11/22/14 196 lb  (88.905 kg)    Glycemic control:   Lab Results  Component Value Date   HGBA1C 8.8 12/21/2014   HGBA1C 8.7* 08/27/2014   HGBA1C 10.9* 06/13/2014   Lab Results  Component Value Date   MICROALBUR 2.5* 03/16/2014   LDLCALC 147* 08/27/2014   CREATININE 1.20 10/18/2014  Medication List       This list is accurate as of: 12/21/14 11:59 PM.  Always use your most recent med list.               aspirin 325 MG tablet  Take 325 mg by mouth daily.     bimatoprost 0.01 % Soln  Commonly known as:  LUMIGAN  Place 1 drop into the left eye at bedtime.     cholecalciferol 1000 UNITS tablet  Commonly known as:  VITAMIN D  Take 5,000 Units by mouth daily.     Co Q 10 100 MG Caps  Take 1 capsule by mouth daily.     insulin lispro 100 UNIT/ML injection  Commonly known as:  HUMALOG  Inject 8-12 Units into the skin 3 (three) times daily before meals. Sliding scale     Insulin NPH (Human) (Isophane) 100 UNIT/ML Kiwkpen  Commonly known as:  HUMULIN N KWIKPEN  Up to 26 units at bedtime as directed     lisinopril 5 MG tablet  Commonly known as:  PRINIVIL,ZESTRIL  TAKE 1 TABLET(5 MG) BY MOUTH DAILY     metoprolol 50 MG tablet  Commonly known as:  LOPRESSOR  Take 1 tablet (50 mg total) by mouth 2 (two) times daily. Pt forgot that physician increased this medication and has only been taking 50 mg twice daily (10/18/14)     TOUJEO SOLOSTAR 300 UNIT/ML Sopn  Generic drug:  Insulin Glargine  Inject 26-39 Units into the skin daily. Take according to what you eat     TURMERIC PO  Take 450 mg by mouth daily.     vitamin B-12 1000 MCG tablet  Commonly known as:  CYANOCOBALAMIN  Take 1,000 mcg by mouth daily.        Allergies:  Allergies  Allergen Reactions  . Actos [Pioglitazone] Swelling  . Bee Venom Swelling  . Codeine Nausea Only    REACTION: hives  . Statins   . Glucophage [Metformin Hcl] Nausea Only  . Raspberry Itching    Past Medical History  Diagnosis  Date  . Hyperlipidemia     a. Intolerant of lipitor and vytorin.  . Glaucoma   . Diabetic retinopathy     mild- Dr. Herbert Deaner  . Coronary artery disease     a. 05/2009 CABG x 3: LIMA->LAD, VG->OM, VG->PDA; b. Nuc 01/2014: inf-lat scar but no ischemia, EF 57%.  . Valvular heart disease     a. 01/2014 Echo: Ef 60-65%, no rwma, mild AI/MS, mod MR, mildly dil LA.  . Paroxysmal atrial flutter     a. 01/2014 s/p DCCV;  b. CHA2DS2VASc = 3-->Eliquis.  . Testicular cancer 1982    "heavy doses of radiation; it was stage 2"  . CHF (congestive heart failure)   . Myocardial infarction     'saw evidence of it on an EKG done in 2005"  . Type II diabetes mellitus   . Pneumonia 1960  . Anemia   . Migraine     "once or twice" (10/31/2014)  . Sacral pain     "right"  . Sciatica of right side   . PTSD (post-traumatic stress disorder)     "X 2 yr post OHS" (10/31/2014)    Past Surgical History  Procedure Laterality Date  . Elbow fracture surgery Left     broken ulna on left elbow-surgical repair  . Cardiac catheterization  2011  . Coronary artery bypass graft  2011    LIMA-LAD, SVG-OM, SVG-PDA  06/20/09  . Cardioversion N/A 02/19/2014    Procedure: CARDIOVERSION;  Surgeon: [name], MD;  Location: [facility] ENDOSCOPY;  Service: Cardiovascular;  Laterality: N/A;  . Abdominal exploration surgery  ~ 2005    "for hernia, but didn't have one"  . Fracture surgery    . Tonsillectomy  ~ 1967  . Testicle removal Right 1982  . Electrophysiologic study N/A 10/31/2014    Procedure: A-Flutter;  Surgeon: [name], MD;  Location: [place] [facility] LAB;  Service: Cardiovascular;  Laterality: N/A;    Family History  Problem Relation Age of Onset  . Heart disease Father   . Heart attack Father   . Diabetes Mother   . Diabetes Father   . Diabetes Sister   . Hypertension Father     Social History:  reports that he has quit smoking. His smoking use included Cigarettes. He smoked 0.00 packs per day for 0  years. He has never used smokeless tobacco. He reports that he drinks about 3.6 oz of alcohol per week. He reports that he does not use illicit drugs.    Review of Systems   Most recent eye exam was ?  2014. Has had glaucoma        Lipids: He was previously on Vytorin and Lipitor. He thinks he had myalgias and weakness when taking these drugs.  He has been recommended statin drugs in the lipid clinic but he refuses to take them stating that they will not help him.  Also has significantly increased LDL particle number He has been interested in alternative treatments but does not meet the criteria for the study drug   Lab Results  Component Value Date   CHOL 203* 08/27/2014   HDL 40.90 08/27/2014   LDLCALC 147* 08/27/2014   TRIG 74.0 08/27/2014   CHOLHDL 5 08/27/2014    He has been  treated by cardiologist for his atrial fibrillation, recently had ablation   Physical Examination:  BP 138/84 mmHg  Pulse 81  Temp(Src) 98.1 F (36.7 C)  Resp 14  Ht 5\' 6"  (1.676 m)  Wt 197 lb 6.4 oz (89.54 kg)  BMI 31.88 kg/m2  SpO2 98%  No pedal edema  ASSESSMENT:  Diabetes type 2, uncontrolled  He has had long-standing diabetes on insulin for over 15 years with usually poor control See history of present illness for details of his current management of problems identified in blood sugar patterns   His blood sugar patterns indicate periodic high readings overnight but has tendency to significant variability also Blood sugars are trending lower in the afternoons and he tends to be getting hypoglycemic relatively often especially when active Also since he does not check his readings after supper he has not adjusted his mealtime coverage at supper to keep up with his postprandial hyperglycemia  A1c is nearly 9% again indicating posterior and hyperglycemia at least at suppertime and overnight   Since increasing the Toujeo to improve overnight blood sugars will cause more hypoglycemia is a  better candidate for NPH insulin at bedtime Discussed the differences between NPH and Toujeo However discussed that he would ideally be managed better with an insulin pump and he needs to check with insurance about coverage for this Discussed basic information on insulin pumps Also he does not adjust his mealtime doses based on activity level and what he is eating despite previous instructions by nurse educator    PLAN:  Start monitoring blood sugars as directed more often after meals including  after supper Start NPH with 14 units and given him flowsheet to adjust this every 3 days based on fasting glucose, stop Toujeo Consider NPH in the morning also if blood sugars are higher in the afternoon Increase suppertime Humalog especially when eating out and eating high-fat meals May reduce lunchtime coverage when he has been more active in the mornings Offered him follow-up with nurse educator and he wants to wait   Patient Instructions  Must check sugars at bedtime, more at lunch  Start 18 Units Humulin at bedtime and adjust based on am sugars every 3 days  Check sugars 4x per day    Counseling time on subjects discussed above is over 50% of today's 25 minute visit     Counseling time on subjects discussed above is over 50% of today's 25 minute visit  KUMAR,AJAY 12/23/2014, 9:09 PM   Note: This office note was prepared with Estate agent. Any transcriptional errors that result from this process are unintentional.

## 2014-12-21 NOTE — Patient Instructions (Addendum)
Must check sugars at bedtime, more at lunch  Start 18 Units Humulin at bedtime and adjust based on am sugars every 3 days  Check sugars 4x per day

## 2015-02-04 DIAGNOSIS — K130 Diseases of lips: Secondary | ICD-10-CM | POA: Diagnosis not present

## 2015-02-12 ENCOUNTER — Other Ambulatory Visit: Payer: Medicare HMO

## 2015-02-15 ENCOUNTER — Ambulatory Visit: Payer: Medicare HMO | Admitting: Endocrinology

## 2015-02-19 ENCOUNTER — Other Ambulatory Visit: Payer: Self-pay | Admitting: *Deleted

## 2015-02-19 ENCOUNTER — Telehealth: Payer: Self-pay | Admitting: Endocrinology

## 2015-02-19 MED ORDER — INSULIN LISPRO 100 UNIT/ML ~~LOC~~ SOLN: SUBCUTANEOUS | Status: DC

## 2015-02-19 NOTE — Addendum Note (Signed)
Addended by: Roxanna Mew on: 02/19/2015 03:29 PM   Modules accepted: Orders, Medications

## 2015-02-19 NOTE — Telephone Encounter (Signed)
rx sent

## 2015-02-19 NOTE — Telephone Encounter (Signed)
Pt needs [place] to call [facility] and renew the humalog qwikpen

## 2015-02-19 NOTE — Telephone Encounter (Signed)
[facility] is calling about the humalog rx it is for a vial not a [employer] # [phone] [employer]

## 2015-02-19 NOTE — Telephone Encounter (Signed)
Gave verbal to pharmacist that he could dispense kwik pen instead of Vial.

## 2015-02-25 DIAGNOSIS — K130 Diseases of lips: Secondary | ICD-10-CM | POA: Diagnosis not present

## 2015-03-11 ENCOUNTER — Other Ambulatory Visit: Payer: Self-pay | Admitting: Physician Assistant

## 2015-03-25 ENCOUNTER — Other Ambulatory Visit (INDEPENDENT_AMBULATORY_CARE_PROVIDER_SITE_OTHER): Payer: Medicare HMO

## 2015-03-25 DIAGNOSIS — IMO0002 Reserved for concepts with insufficient information to code with codable children: Secondary | ICD-10-CM

## 2015-03-25 DIAGNOSIS — E1165 Type 2 diabetes mellitus with hyperglycemia: Secondary | ICD-10-CM | POA: Diagnosis not present

## 2015-03-25 LAB — COMPREHENSIVE METABOLIC PANEL
ALT: 17 U/L (ref 0–53)
AST: 20 U/L (ref 0–37)
Albumin: 3.6 g/dL (ref 3.5–5.2)
Alkaline Phosphatase: 79 U/L (ref 39–117)
BUN: 23 mg/dL (ref 6–23)
CO2: 31 mEq/L (ref 19–32)
Calcium: 9.5 mg/dL (ref 8.4–10.5)
Chloride: 102 mEq/L (ref 96–112)
Creatinine, Ser: 0.96 mg/dL (ref 0.40–1.50)
GFR: 83.06 mL/min (ref >60.00)
Glucose, Bld: 138 mg/dL — ABNORMAL HIGH (ref 70–99)
Potassium: 4.8 mEq/L (ref 3.5–5.1)
Sodium: 139 mEq/L (ref 135–145)
Total Bilirubin: 0.5 mg/dL (ref 0.2–1.2)
Total Protein: 6.5 g/dL (ref 6.0–8.3)

## 2015-03-26 LAB — FRUCTOSAMINE: Fructosamine: 348 umol/L — ABNORMAL HIGH (ref 0–285)

## 2015-03-28 ENCOUNTER — Ambulatory Visit: Payer: Medicare HMO | Admitting: Endocrinology

## 2015-04-19 DIAGNOSIS — M25512 Pain in left shoulder: Secondary | ICD-10-CM | POA: Diagnosis not present

## 2015-05-02 ENCOUNTER — Ambulatory Visit: Payer: Medicare HMO | Admitting: Endocrinology

## 2015-05-07 ENCOUNTER — Other Ambulatory Visit: Payer: Self-pay | Admitting: *Deleted

## 2015-05-07 ENCOUNTER — Other Ambulatory Visit: Payer: Self-pay | Admitting: Endocrinology

## 2015-05-07 MED ORDER — LISINOPRIL 5 MG PO TABS: ORAL_TABLET | ORAL | Status: DC

## 2015-07-02 ENCOUNTER — Other Ambulatory Visit: Payer: Self-pay | Admitting: Endocrinology

## 2015-07-03 ENCOUNTER — Telehealth: Payer: Self-pay | Admitting: Endocrinology

## 2015-07-03 ENCOUNTER — Other Ambulatory Visit: Payer: Self-pay | Admitting: *Deleted

## 2015-07-03 MED ORDER — INSULIN ISOPHANE HUMAN 100 UNIT/ML KWIKPEN: PEN_INJECTOR | SUBCUTANEOUS | Status: DC

## 2015-07-03 MED ORDER — LISINOPRIL 5 MG PO TABS: 5.0000 mg | ORAL_TABLET | Freq: Every day | ORAL | Status: DC

## 2015-07-03 NOTE — Telephone Encounter (Signed)
Pt needs his Humulin and Lisinopril refilled

## 2015-07-03 NOTE — Telephone Encounter (Signed)
rx's sent for 30 day supplies only, needs to be seen for further refills.

## 2015-07-18 ENCOUNTER — Ambulatory Visit (INDEPENDENT_AMBULATORY_CARE_PROVIDER_SITE_OTHER): Payer: Medicare HMO | Admitting: Endocrinology

## 2015-07-18 ENCOUNTER — Encounter: Payer: Self-pay | Admitting: Endocrinology

## 2015-07-18 ENCOUNTER — Other Ambulatory Visit: Payer: Self-pay | Admitting: *Deleted

## 2015-07-18 VITALS — BP 128/80 | HR 76 | Temp 98.4°F | Resp 14 | Ht 66.0 in | Wt 199.6 lb

## 2015-07-18 DIAGNOSIS — E1165 Type 2 diabetes mellitus with hyperglycemia: Secondary | ICD-10-CM

## 2015-07-18 DIAGNOSIS — Z794 Long term (current) use of insulin: Secondary | ICD-10-CM | POA: Diagnosis not present

## 2015-07-18 LAB — POCT URINALYSIS DIPSTICK
Bilirubin, UA: NEGATIVE
Blood, UA: NEGATIVE
Glucose, UA: 250
Ketones, UA: NEGATIVE
Leukocytes, UA: NEGATIVE
Nitrite, UA: NEGATIVE
Protein, UA: NEGATIVE
Spec Grav, UA: 1.02
Urobilinogen, UA: 0.2
pH, UA: 5

## 2015-07-18 LAB — MICROALBUMIN / CREATININE URINE RATIO
Creatinine,U: 109.4 mg/dL
Microalb Creat Ratio: 3.1 mg/g (ref 0.0–30.0)
Microalb, Ur: 3.4 mg/dL — ABNORMAL HIGH (ref 0.0–1.9)

## 2015-07-18 LAB — COMPREHENSIVE METABOLIC PANEL
ALT: 17 U/L (ref 0–53)
AST: 17 U/L (ref 0–37)
Albumin: 3.8 g/dL (ref 3.5–5.2)
Alkaline Phosphatase: 63 U/L (ref 39–117)
BUN: 21 mg/dL (ref 6–23)
CO2: 30 mEq/L (ref 19–32)
Calcium: 9.3 mg/dL (ref 8.4–10.5)
Chloride: 102 mEq/L (ref 96–112)
Creatinine, Ser: 0.92 mg/dL (ref 0.40–1.50)
GFR: 87.16 mL/min (ref >60.00)
Glucose, Bld: 150 mg/dL — ABNORMAL HIGH (ref 70–99)
Potassium: 4.6 mEq/L (ref 3.5–5.1)
Sodium: 137 mEq/L (ref 135–145)
Total Bilirubin: 0.4 mg/dL (ref 0.2–1.2)
Total Protein: 6.9 g/dL (ref 6.0–8.3)

## 2015-07-18 LAB — LIPID PANEL
Cholesterol: 222 mg/dL — ABNORMAL HIGH (ref 0–200)
HDL: 48.3 mg/dL (ref >39.00)
LDL Cholesterol: 153 mg/dL — ABNORMAL HIGH (ref 0–99)
NonHDL: 173.83
Total CHOL/HDL Ratio: 5
Triglycerides: 106 mg/dL (ref 0.0–149.0)
VLDL: 21.2 mg/dL (ref 0.0–40.0)

## 2015-07-18 LAB — POCT GLYCOSYLATED HEMOGLOBIN (HGB A1C): Hemoglobin A1C: 8.6

## 2015-07-18 MED ORDER — INSULIN ISOPHANE HUMAN 100 UNIT/ML KWIKPEN: PEN_INJECTOR | SUBCUTANEOUS | Status: DC

## 2015-07-18 MED ORDER — METOPROLOL TARTRATE 50 MG PO TABS: 50.0000 mg | ORAL_TABLET | Freq: Two times a day (BID) | ORAL | Status: DC

## 2015-07-18 MED ORDER — BD PEN NEEDLE NANO U/F 32G X 4 MM MISC: Status: DC

## 2015-07-18 MED ORDER — CO Q 10 100 MG PO CAPS: 1.0000 | ORAL_CAPSULE | Freq: Every day | ORAL | Status: DC

## 2015-07-18 MED ORDER — GLUCOSE BLOOD VI STRP: ORAL_STRIP | Status: DC

## 2015-07-18 MED ORDER — LISINOPRIL 5 MG PO TABS: 5.0000 mg | ORAL_TABLET | Freq: Every day | ORAL | Status: DC

## 2015-07-18 MED ORDER — ONETOUCH DELICA LANCETS 33G MISC: Status: DC

## 2015-07-18 MED ORDER — INSULIN LISPRO 100 UNIT/ML (KWIKPEN): 12.0000 [IU] | PEN_INJECTOR | Freq: Three times a day (TID) | SUBCUTANEOUS | Status: DC

## 2015-07-18 NOTE — Progress Notes (Signed)
Patient ID: Matthew [name], male   DOB: 1946-04-15, 70 y.o.   MRN: [id number]           Reason for Appointment: Followup for Type 2 Diabetes  Referring physician: Criss Rosales  History of Present Illness:          Diagnosis: Type 2 diabetes mellitus, date of diagnosis:  1992      Past history: His blood sugar was high at diagnosis when he was having a routine screening done. He was started on Glucophage initially which he took for about a year. He thinks it did not help his sugar much and he did not feel good with it Apparently he was trying to control his diabetes with diet and exercise for a few years. He may have tried Glucophage again before going on insulin. Also was started on Actos which caused swelling He has been treated mostly with premixed insulin for about 15 years and his level of control appears to be inadequate although details of previous treatment are not available. He may have taken Humalog at one time for use with high sugars A1c was 11.5 in 2011 On his initial consultation in 9/15 because of poor control with premixed insulin he was switched to Levemir twice a day and Humalog with meals.  Recent history:   INSULIN regimen is described as: 18 hs; Humalog 12-14 units 3 times a day with meals   He has been irregular with his follow-up and has not been seen since 8/16 His A1c continues to be over 8%  Previously had continuous glucose monitoring done in 7/16 on his regimen of Toujeo and Humalog which showed the following findings and recommendations:  He does need significant higher amounts of basal insulin through the night on most of the nights heading into late morning.  Blood sugars are mostly better between about 12 noon-4 PM without hypoglycemia and may be related to exercise or mealtime insulin.  He also does need better control of his postprandial readings after supper when blood sugars are going up over 200 on at least 3 or 4 of his meals and sometimes stay consistently  higher into the night     Current management, blood sugar patterns and problems:   He was switched from [place] to NPH at bedtime to cover overnight hyperglycemia and also is taking 10 units less compared to Toujeo.  Not clear what his blood sugar patterns are as he did not bring his monitor  He thinks his fasting readings are overall lower compared to when he was taking Toujeo  He probably checks blood sugars sporadically during the day and none after supper  He thinks he will get hypoglycemia occasionally in the early morning especially if he has exercised the day before  Also may occasionally have low sugars in the afternoon based on his meal size  He is taking about the same amount of insulin for all his meals despite variable intake With hypoglycemia he has symptoms of weakness, sweating, shakiness and confusion Treating low sugars with orange juice       Oral hypoglycemic drugs the patient is taking are: None      Side effects from medications have been: Edema from Actos,? Nausea and malaise from metformin Compliance with the medical regimen: Fair  Glucose monitoring:  done ?  one time a day         Glucometer: One Touch.      Blood Glucose readings by recall:  Mean values apply above for all  meters except median for One Touch  PRE-MEAL Fasting Lunch Dinner Bedtime Overall  Glucose range: 60-220   ?   Mean/median: ?  170          Self-care:   Meals: 3 meals per day.  Bfst 8:30, 1 pm Dinner is about 8 PM.  Some meals are high fat      Exercise: Racquetball about 3-4 days a week mostly in morning before breakfast , sometimes late afternoon           Dietician visit, most recent:?  15 years ago.     CDE visit: 3/16            Weight history:  Wt Readings from Last 3 Encounters:  07/18/15 199 lb 9.6 oz (90.538 kg)  12/21/14 197 lb 6.4 oz (89.54 kg)  12/18/14 198 lb 3.2 oz (89.903 kg)    Glycemic control:   Lab Results  Component Value Date   HGBA1C 8.6  07/18/2015   HGBA1C 8.8 12/21/2014   HGBA1C 8.7* 08/27/2014   Lab Results  Component Value Date   MICROALBUR 2.5* 03/16/2014   LDLCALC 147* 08/27/2014   CREATININE 0.96 03/25/2015         Medication List       This list is accurate as of: 07/18/15 11:25 AM.  Always use your most recent med list.               aspirin 325 MG tablet  Take 325 mg by mouth daily.     BD PEN NEEDLE NANO U/F 32G X 4 MM Misc  Generic drug:  Insulin Pen Needle  Use 3-4 per day to inject insulin     bimatoprost 0.01 % Soln  Commonly known as:  LUMIGAN  Place 1 drop into the left eye at bedtime.     cholecalciferol 1000 units tablet  Commonly known as:  VITAMIN D  Take 5,000 Units by mouth daily. Reported on 07/18/2015     Co Q 10 100 MG Caps  Take 1 capsule by mouth daily.     glucose blood test strip  Commonly known as:  ONETOUCH VERIO  Use as instructed to check blood sugar 2 times per day dx code E11.59     insulin lispro 100 UNIT/ML KiwkPen  Commonly known as:  HUMALOG KWIKPEN  Inject 0.12-0.15 mLs (12-15 Units total) into the skin 3 (three) times daily.     Insulin NPH (Human) ([place]) 100 UNIT/ML Kiwkpen  Commonly known as:  HUMULIN N KWIKPEN  Inject 18-20 units daily at night     lisinopril 5 MG tablet  Commonly known as:  PRINIVIL,ZESTRIL  Take 1 tablet (5 mg total) by mouth daily.     metoprolol 50 MG tablet  Commonly known as:  LOPRESSOR  Take 1 tablet (50 mg total) by mouth 2 (two) times daily.     ONETOUCH DELICA LANCETS 99991111 Misc  Use to check blood sugar 2 times per day dx code E11.59     TURMERIC PO  Take 450 mg by mouth daily.     vitamin B-12 1000 MCG tablet  Commonly known as:  CYANOCOBALAMIN  Take 1,000 mcg by mouth daily.        Allergies:  Allergies  Allergen Reactions  . Actos [Pioglitazone] Swelling  . Bee Venom Swelling  . Codeine Nausea Only    REACTION: hives  . Statins   . Glucophage [Metformin Hcl] Nausea Only  . Raspberry Itching  Past Medical History  Diagnosis Date  . Hyperlipidemia     a. Intolerant of lipitor and vytorin.  . Glaucoma   . Diabetic retinopathy (HCC)     mild- Dr. Herbert Deaner  . Coronary artery disease     a. 05/2009 CABG x 3: LIMA->LAD, VG->OM, VG->PDA; b. Nuc 01/2014: inf-lat scar but no ischemia, EF 57%.  . Valvular heart disease     a. 01/2014 Echo: Ef 60-65%, no rwma, mild AI/MS, mod MR, mildly dil LA.  . Paroxysmal atrial flutter ([place])     a. 01/2014 s/p DCCV;  b. CHA2DS2VASc = 3-->Eliquis.  . Testicular cancer ([place]) 1982    "heavy doses of radiation; it was stage 2"  . CHF (congestive heart failure) ([place])   . Myocardial infarction [facility])     'saw evidence of it on an EKG done in 2005"  . Type II diabetes mellitus ([place])   . Pneumonia 1960  . Anemia   . Migraine     "once or twice" (10/31/2014)  . Sacral pain     "right"  . Sciatica of right side   . PTSD (post-traumatic stress disorder)     "X 2 yr post OHS" (10/31/2014)    Past Surgical History  Procedure Laterality Date  . Elbow fracture surgery Left     broken ulna on left elbow-surgical repair  . Cardiac catheterization  2011  . Coronary artery bypass graft  2011    LIMA-LAD, SVG-OM, SVG-PDA 06/20/09  . Cardioversion N/A 02/19/2014    Procedure: CARDIOVERSION;  Surgeon: [name], MD;  Location: [facility] ENDOSCOPY;  Service: Cardiovascular;  Laterality: N/A;  . Abdominal exploration surgery  ~ 2005    "for hernia, but didn't have one"  . Fracture surgery    . Tonsillectomy  ~ 1967  . Testicle removal Right 1982  . Electrophysiologic study N/A 10/31/2014    Procedure: A-Flutter;  Surgeon: [name], MD;  Location: [facility] CV LAB;  Service: Cardiovascular;  Laterality: N/A;    Family History  Problem Relation Age of Onset  . Heart disease Father   . Heart attack Father   . Diabetes Mother   . Diabetes Father   . Diabetes Sister   . Hypertension Father     Social History:  reports that he has quit smoking.  His smoking use included Cigarettes. He smoked 0.00 packs per day for 0 years. He has never used smokeless tobacco. He reports that he drinks about 3.6 oz of alcohol per week. He reports that he does not use illicit drugs.    Review of Systems   Unknown date of last eye exam       Lipids: He was previously on Vytorin and Lipitor. He thinks he had myalgias and weakness when taking these drugs.  He has been recommended statin drugs in the lipid clinic but he refuses to take them stating that they will not help him.  Also has significantly increased LDL particle number   Lab Results  Component Value Date   CHOL 203* 08/27/2014   HDL 40.90 08/27/2014   LDLCALC 147* 08/27/2014   TRIG 74.0 08/27/2014   CHOLHDL 5 08/27/2014    He has been  treated by cardiologist for his atrial fibrillation,No recent recurrence after ablation  Physical Examination:  BP 128/80 mmHg  Pulse 76  Temp(Src) 98.4 F (36.9 C)  Resp 14  Ht 5\' 6"  (1.676 m)  Wt 199 lb 9.6 oz (90.538 kg)  BMI 32.23  kg/m2  SpO2 97%  No pedal edema  ASSESSMENT:  Diabetes type 2, uncontrolled  He has had long-standing diabetes on insulin for over 15 years with usually poor control See history of present illness for details of his current management of problems identified in blood sugar patterns   He did not bring his blood sugar monitor for download and not clear how often he is checking his readings also; generally noncompliant with this anyway Although he claims that his blood sugars are better controlled with NPH compared to Toujeo his A1c is about the same He thinks his overnight hypoglycemia as related to having exercise the day before but still has significantly high fasting readings Previously had patterns of high readings after supper and overnight and may still be having the same patterns By history appears to be having inconsistent control of postprandial readings  HYPERLIPIDEMIA with LDL 147, he has refused  treatment as he thinks that it is not necessary despite his risk factors  PLAN:  Start monitoring blood sugars consistently at various times of the day including at bedtime Reduce NPH by at least 2 units when exercising and increase it by 4 units when not exercising Instruction on carbohydrate counting to be done by dietitian and she will also need to advise him on mealtime adjustment, most likely will need 1:5 coverage for meals Continuous glucose monitoring to be done 2 weeks prior to his next visit Review management in 2 months again Recommended follow-up with PCP but for now will refill his lisinopril for hypertension Recheck lipids today  For his Hyperlipidemia recommend that he start taking Zetia although he still is refusing to consider any medications   Patient Instructions  N insulin 16-22 units based on activity level  Humalog based on carbs  Check into Zetia       Counseling time on subjects discussed above is over 50% of today's 25 minute visit      KUMAR,AJAY 07/18/2015, 11:25 AM   Note: This office note was prepared with Estate agent. Any transcriptional errors that result from this process are unintentional.

## 2015-07-18 NOTE — Patient Instructions (Addendum)
N insulin 16-22 units based on activity level  Humalog based on carbs  Check into Zetia

## 2015-09-09 ENCOUNTER — Other Ambulatory Visit: Payer: Self-pay | Admitting: *Deleted

## 2015-09-09 ENCOUNTER — Other Ambulatory Visit: Payer: Self-pay | Admitting: Endocrinology

## 2015-09-09 MED ORDER — LISINOPRIL 5 MG PO TABS: 5.0000 mg | ORAL_TABLET | Freq: Every day | ORAL | Status: DC

## 2015-09-12 ENCOUNTER — Other Ambulatory Visit: Payer: Medicare HMO

## 2015-09-15 DIAGNOSIS — R69 Illness, unspecified: Secondary | ICD-10-CM | POA: Diagnosis not present

## 2015-09-26 ENCOUNTER — Ambulatory Visit: Payer: Medicare HMO | Admitting: Endocrinology

## 2015-10-01 ENCOUNTER — Encounter: Payer: Medicare HMO | Admitting: Nutrition

## 2015-10-04 ENCOUNTER — Telehealth: Payer: Self-pay | Admitting: Interventional Cardiology

## 2015-10-04 DIAGNOSIS — I483 Typical atrial flutter: Secondary | ICD-10-CM

## 2015-10-04 DIAGNOSIS — R5383 Other fatigue: Secondary | ICD-10-CM

## 2015-10-04 DIAGNOSIS — R002 Palpitations: Secondary | ICD-10-CM

## 2015-10-04 NOTE — Telephone Encounter (Signed)
The pt states that he has been having palpitations for the past couple of weeks and that his abdomen is swollen like it was prior to his ablation 10 month ago. He also c/o fatigue and weakness. He denies dizziness, lightheadedness, swelling in his hands or feet. He states that he still has his Lasix that he had prior to his ablation and wants to know if it is ok to take one 40 mg tablet. He is also requesting an apt soon.  Per Cecilie Kicks, NP the pt is advised that he can resume Lasix at 40 mg daily, Potassium 20 meq daily and Eliquis 50 mg BID until he is seen in OV here next week. Mickel Baas also recommends that the pt have a BMET, CBC, Mag and TSH on Monday 6/12 and be seen at our next available apt.  The pt is advised and he states that he still has Lasix 40 mg, Potassium 20 meq and Eliquis 5 mg from before his ablation so he will not need RX sent to pharmacy. He is aware to take Lasix 40 mg daily, Potassium 20 meq daily and Eliquis 5 mg BID. Pt is offered and he excepted our next available apt at 2:30 6/16 (Tuesday) with Angelena Form, PA-c.  Labs have been ordered and scheduled to be drawn here on 6/12. Apt scheduled with K. Grandville Silos, PA-c on 6/13 at 2:30.

## 2015-10-04 NOTE — Telephone Encounter (Signed)
Patient c/o Palpitations:  High priority if patient c/o lightheadedness and shortness of breath.  1. How long have you been having palpitations? Couple weeks  2. Are you currently experiencing lightheadedness and shortness of breath? Little- SOB-  3. Have you checked your BP and heart rate? (document readings) Pt stated- HR is irregular.   4. Are you experiencing any other symptoms? Abdomin

## 2015-10-04 NOTE — Telephone Encounter (Signed)
Agree with above plan. 

## 2015-10-07 ENCOUNTER — Other Ambulatory Visit: Payer: Self-pay | Admitting: Cardiology

## 2015-10-07 ENCOUNTER — Other Ambulatory Visit (INDEPENDENT_AMBULATORY_CARE_PROVIDER_SITE_OTHER): Payer: Medicare HMO | Admitting: *Deleted

## 2015-10-07 ENCOUNTER — Telehealth: Payer: Self-pay | Admitting: Cardiology

## 2015-10-07 DIAGNOSIS — I483 Typical atrial flutter: Secondary | ICD-10-CM

## 2015-10-07 DIAGNOSIS — R5383 Other fatigue: Secondary | ICD-10-CM | POA: Diagnosis not present

## 2015-10-07 DIAGNOSIS — R002 Palpitations: Secondary | ICD-10-CM

## 2015-10-07 DIAGNOSIS — Z79899 Other long term (current) drug therapy: Secondary | ICD-10-CM | POA: Diagnosis not present

## 2015-10-07 DIAGNOSIS — I4892 Unspecified atrial flutter: Secondary | ICD-10-CM | POA: Diagnosis not present

## 2015-10-07 LAB — CBC WITH DIFFERENTIAL/PLATELET
Basophils Absolute: 0 cells/uL (ref 0–200)
Basophils Relative: 0 %
Eosinophils Absolute: 216 cells/uL (ref 15–500)
Eosinophils Relative: 3 %
HCT: 38 % — ABNORMAL LOW (ref 38.5–50.0)
Hemoglobin: 12.6 g/dL — ABNORMAL LOW (ref 13.2–17.1)
Lymphocytes Relative: 19 %
Lymphs Abs: 1368 cells/uL (ref 850–3900)
MCH: 30.4 pg (ref 27.0–33.0)
MCHC: 33.2 g/dL (ref 32.0–36.0)
MCV: 91.8 fL (ref 80.0–100.0)
MPV: 9.8 fL (ref 7.5–12.5)
Monocytes Absolute: 504 cells/uL (ref 200–950)
Monocytes Relative: 7 %
Neutro Abs: 5112 cells/uL (ref 1500–7800)
Neutrophils Relative %: 71 %
Platelets: 253 10*3/uL (ref 140–400)
RBC: 4.14 MIL/uL — ABNORMAL LOW (ref 4.20–5.80)
RDW: 13.8 % (ref 11.0–15.0)
WBC: 7.2 10*3/uL (ref 3.8–10.8)

## 2015-10-07 LAB — VITAMIN B12: Vitamin B-12: 341 pg/mL (ref 200–1100)

## 2015-10-07 LAB — BASIC METABOLIC PANEL
BUN: 31 mg/dL — ABNORMAL HIGH (ref 7–25)
CO2: 23 mmol/L (ref 20–31)
Calcium: 9.4 mg/dL (ref 8.6–10.3)
Chloride: 94 mmol/L — ABNORMAL LOW (ref 98–110)
Creat: 1.16 mg/dL (ref 0.70–1.25)
Glucose, Bld: 484 mg/dL — ABNORMAL HIGH (ref 65–99)
Potassium: 5.4 mmol/L — ABNORMAL HIGH (ref 3.5–5.3)
Sodium: 132 mmol/L — ABNORMAL LOW (ref 135–146)

## 2015-10-07 LAB — MAGNESIUM: Magnesium: 1.9 mg/dL (ref 1.5–2.5)

## 2015-10-07 LAB — TSH: TSH: 2.97 mIU/L (ref 0.40–4.50)

## 2015-10-07 LAB — TESTOSTERONE: Testosterone: 352 ng/dL (ref 250–827)

## 2015-10-07 NOTE — Telephone Encounter (Signed)
New Message   JESSICA from [facility] called requesting to speak with RN to give Result on alert value

## 2015-10-07 NOTE — Addendum Note (Signed)
Addended by: Eulis Foster on: 10/07/2015 10:13 AM   Modules accepted: Orders

## 2015-10-07 NOTE — Progress Notes (Signed)
Cardiology Office Note    Date:  10/08/2015   ID:  Matthew [name], DOB 1945/11/13, MRN [id number]  PCP:  Matthew Peers, MD  Cardiologist: Dr. Irish [name] / Dr. Lovena [name] (EP)  CC: palpitations and abdominal swelling  History of Present Illness:  Matthew [name] is a 70 y.o. male with a history of CAD s/p CABG x3V (2011), DMT2, HLD, PAFlutter s/p ablation (10/2014) who presents to clinic for evaluation of palpitations and abdominal swelling.   He was initially diagnosed with afib vs aflutter in 01/2014. He felt fatigued. Eliquis was started by his PCP prior to this visit. TSH normal. Metoprolol dose was increased. Myoview was obtained.This demonstrated inferolateral scar but no ischemia. Med Rx was recommended. Echo demonstrated normal LVF with mild AI/mod MR/mild MS. He underwent DCCV later that month.   He was seen by Matthew Bayley NP in 09/2014 for evaluation of recurrent atrial flutter and acute diastolic CHF. He was started on metoprolol and lasix. He was referred to EP for ablation. He underwent RFCA on 10/31/14 and came off his eliquis.   He was in his usual state of health until about 6-8 weeks ago when he started to notice abdominal distention as well as fatigue and weakness. He also had some intermittent [name] edema. He has also felt his heart skipping and hasn't been able to to find his pulse. He called in on Friday 10/04/15 and was advised to resume lasix, potassium and eliquis. Lab work was obtained which showed slightly high potassium and he was instructed to hold his potassium supplementation.  He presents to clinic today for follow up. ECG shows aflutter with var AV block. HR 107. He is feeling a little better after lasix. No CP or SOB. [name] edema a little better. Still with some abdominal distension. Still feeling fatigued. Wants to know if he can play racquet ball.    Past Medical History  Diagnosis Date  . Hyperlipidemia     a. Intolerant of lipitor and vytorin.  . Glaucoma   .  Diabetic retinopathy (Matthew [name])     mild- Dr. Herbert [name]  . Coronary artery disease     a. 05/2009 CABG x 3: LIMA->LAD, Matthew [name], Matthew [name]; b. Nuc 01/2014: inf-lat scar but no ischemia, EF 57%.  . Valvular heart disease     a. 01/2014 Echo: Ef 60-65%, no rwma, mild AI/MS, mod MR, mildly dil LA.  . Paroxysmal atrial flutter (Matthew [name])     a. 01/2014 s/p DCCV;  b. CHA2DS2VASc = 3-->Eliquis.  . Testicular cancer (Matthew [name]) 1982    "heavy doses of radiation; it was stage 2"  . CHF (congestive heart failure) (Matthew [name])   . Myocardial infarction [name])     'saw evidence of it on an EKG done in 2005"  . Type II diabetes mellitus (Matthew [name])   . Pneumonia 1960  . Anemia   . Migraine     "once or twice" (10/31/2014)  . Sacral pain     "right"  . Sciatica of right side   . Matthew [name] (post-traumatic stress disorder)     "X 2 yr post Matthew [name]" (10/31/2014)    Past Surgical History  Procedure Laterality Date  . Elbow fracture surgery Left     broken ulna on left elbow-surgical repair  . Cardiac catheterization  2011  . Coronary artery bypass graft  2011    Matthew [name], SVG-OM, SVG-PDA 06/20/09  . Cardioversion N/A 02/19/2014    Procedure: CARDIOVERSION;  Surgeon: Matthew Grooms, MD;  Location: [name];  Service: Cardiovascular;  Laterality: N/A;  . Abdominal exploration surgery  ~ 2005    "for hernia, but didn't have one"  . Fracture surgery    . Tonsillectomy  ~ 1967  . Testicle removal Right 1982  . Electrophysiologic study N/A 10/31/2014    Procedure: A-Flutter;  Surgeon: Matthew Lance, MD;  Location: [name];  Service: Cardiovascular;  Laterality: N/A;    Current Medications: Outpatient Prescriptions Prior to Visit  Medication Sig Dispense Refill  . apixaban (ELIQUIS) 5 MG TABS tablet Take 1 tablet (5 mg total) by mouth 2 (two) times daily.    . BD PEN NEEDLE NANO U/F 32G X 4 MM MISC Use 3-4 per day to inject insulin 200 each 1  . bimatoprost (LUMIGAN) 0.01 % SOLN Place 1 drop into the left eye at bedtime.    .  cholecalciferol (VITAMIN D) 1000 UNITS tablet Take 5,000 Units by mouth daily. Reported on 07/18/2015    . Coenzyme Q10 (CO Q 10) 100 MG CAPS Take 1 capsule by mouth daily. 90 capsule 1  . furosemide (LASIX) 40 MG tablet Take 1 tablet (40 mg total) by mouth daily.    Marland Kitchen glucose blood (ONETOUCH VERIO) test strip Use as instructed to check blood sugar 2 times per day dx code E11.59 200 each 1  . insulin lispro (HUMALOG KWIKPEN) 100 UNIT/ML KiwkPen Inject 0.12-0.15 mLs (12-15 Units total) into the skin 3 (three) times daily. 45 mL 1  . lisinopril (PRINIVIL,ZESTRIL) 5 MG tablet TAKE 1 TABLET BY MOUTH EVERY DAY 30 tablet 0  . ONETOUCH DELICA LANCETS 99991111 MISC Use to check blood sugar 2 times per day dx code E11.59 200 each 1  . potassium chloride SA (KLOR-CON M20) 20 MEQ tablet Take 1 tablet (20 mEq total) by mouth daily.    . TURMERIC PO Take 450 mg by mouth daily.     Marland Kitchen aspirin 325 MG tablet Take 325 mg by mouth daily.    . metoprolol (LOPRESSOR) 50 MG tablet Take 1 tablet (50 mg total) by mouth 2 (two) times daily. 180 tablet 1  . Insulin NPH, Human,, Isophane, (HUMULIN N KWIKPEN) 100 UNIT/ML Kiwkpen Inject 18-20 units daily at night 15 mL 1  . vitamin B-12 (CYANOCOBALAMIN) 1000 MCG tablet Take 1,000 mcg by mouth daily.     No facility-administered medications prior to visit.     Allergies:   Actos; Bee venom; Codeine; Statins; Glucophage; and Raspberry   Social History   Social History  . Marital Status: Married    Spouse Name: N/A  . Number of Children: N/A  . Years of Education: N/A   Social History Main Topics  . Smoking status: Former Smoker -- 0.00 packs/day for 0 years    Types: Cigarettes  . Smokeless tobacco: Never Used     Comment: "quit smoking cigarettes in the 1980's; don't know how much or for how long"  . Alcohol Use: 3.6 oz/week    6 Cans of beer per week  . Drug Use: No  . Sexual Activity: Yes    Birth Control/ Protection: Coitus interruptus   Other Topics Concern    . None   Social History Narrative     Family History:  The patient's family history includes Diabetes in his father, mother, and sister; Heart attack in his father; Heart disease in his father; Hypertension in his father.   ROS:   Please see the history of present illness.    ROS All other  systems reviewed and are negative.   PHYSICAL EXAM:   VS:  BP 126/78 mmHg  Pulse 107  Ht 5\' 6"  (1.676 m)  Wt 193 lb (87.544 kg)  BMI 31.17 kg/m2   GEN: Well nourished, well developed, in no acute distress HEENT: normal Neck: no JVD, carotid bruits, or masses Cardiac: irreg irreg; tachy no murmurs, rubs, or gallops,no edema  Respiratory:  clear to auscultation bilaterally, normal work of breathing GI: soft, nontender, nondistended, + BS MS: no deformity or atrophy Skin: warm and dry, no rash Neuro:  Alert and Oriented x 3, Strength and sensation are intact Psych: euthymic mood, full affect  Wt Readings from Last 3 Encounters:  10/08/15 193 lb (87.544 kg)  07/18/15 199 lb 9.6 oz (90.538 kg)  12/21/14 197 lb 6.4 oz (89.54 kg)      Studies/Labs Reviewed:   EKG:  EKG is ordered today.  The ekg ordered today demonstrates atrial flutter with var AV block HR 107 inferior Q waves  Recent Labs: 07/18/2015: ALT 17 10/07/2015: BUN 31*; Creat 1.16; Hemoglobin 12.6*; Magnesium 1.9; Platelets 253; Potassium 5.4*; Sodium 132*; TSH 2.97   Lipid Panel    Component Value Date/Time   CHOL 222* 07/18/2015 0900   CHOL 216* 06/21/2013 1405   TRIG 106.0 07/18/2015 0900   TRIG 132 06/21/2013 1405   HDL 48.30 07/18/2015 0900   HDL 54 06/21/2013 1405   CHOLHDL 5 07/18/2015 0900   VLDL 21.2 07/18/2015 0900   LDLCALC 153* 07/18/2015 0900   LDLCALC 136* 06/21/2013 1405    Additional studies/ records that were reviewed today include:  2D ECHO 02/05/2014 LV EF: 60% -  65% Study Conclusion - Left ventricle: The cavity size was normal. There was mild focal basal hypertrophy of the septum. Systolic  function was normal. The estimated ejection fraction was in the range of 60% to 65%. Wall motion was normal; there were no regional wall motion abnormalities. - Aortic valve: Valve mobility was restricted. There was mild regurgitation. Mean gradient (S): 7 mm Hg. Peak gradient (S): 12 mm Hg. Valve area (VTI): 2.03 cm^2. Valve area (Vmean): 2.02 cm^2. - Mitral valve: Calcified annulus. Mildly thickened leaflets . The findings are consistent with mild stenosis. There was moderate regurgitation. Valve area by continuity equation (using LVOT flow): 2.29 cm^2. - Left atrium: The atrium was mildly dilated. - Right ventricle: Systolic function was mildly reduced. - Right atrium: The atrium was mildly dilated. Impressions: - Normal LV function; mild biatrial enlargement; thickened MV; moderate MR; mild MS by mean gradient (6 mmHg); calcified aortic valve but no significant AS by doppler; mild AI.   01/2014 Myoview Overall Impression: Intermediate risk stress nuclear study with large-sized (Extent 18%), severe intensity fixed inferolateral defect consitent with LCx territory scar. LV Ejection Fraction: 57%. LV Wall Motion: inferolateral akinesis  ASSESSMENT & PLAN:    Paroxysmal atrial flutter: s/p previous ablation in 10/2014 by Dr. Lovena [name]. Now with recurrent flutter that is clearly symptomatic. He was restarted on Eliquis on 10/04/15. HR 107 today. Will increase lopressor 50mg  BID to 75 mg BID and plan for DCCV after 3 weeks of uninterrupted eliquis. I will see him back in clinic in 2 weeks.   Acute diastolic CHF: Normal LV function by echo in October 2015. He seems to get volume overloaded when he goes into flutter. Has been having some abdominal distension and [name] edema which has improved on lasix 40mg  daily. Will continue this for now. Will get BMET today and next week.  Hyperkalemia: K was held. Will check BMET today.   HTN: Stable.   Type 2 diabetes mellitus:  Followed by endocrinology.  HLD: He is apparently intolerant to statins. His LDL of 147. May be a good candidate for PCSK 9 inhibitors.   CAD s/p CABG x3V (2011): will decrease ASA from 325mg  --> 81mg  daily. Continue BB. Intolerant to statins.   Medication Adjustments/Labs and Tests Ordered: Current medicines are reviewed at length with the patient today.  Concerns regarding medicines are outlined above.  Medication changes, Labs and Tests ordered today are listed in the Patient Instructions below. Patient Instructions  Medication Instructions:  Your physician has recommended you make the following change in your medication:  1.  INCREASE the Metoprolol 50 mg taking 1 1/2 tablet twice a day 2.  STOP Aspirin 325 mg and START Aspirin 81 mg taking 1 tablet daily  Labwork: TODAY:  BMET  Testing/Procedures: None ordered  Follow-Up: Your physician recommends that you schedule a follow-up appointment in: 10/23/15 ARRIVE AT 8:45 TO SEE Nell Range, PA-C Your physician recommends that you schedule a follow-up appointment in: SEE DR. Lovena [name] AT HIS 1ST AVAILABLE (AFTER 10/26/15)    Any Other Special Instructions Will Be Listed Below (If Applicable).     If you need a refill on your cardiac medications before your next appointment, please call your pharmacy.       Signed, Angelena Form, PA-C  10/08/2015 3:19 PM    [facility] [place], [place],   [address] Phone: [phone]; Fax: [phone]

## 2015-10-08 ENCOUNTER — Encounter: Payer: Self-pay | Admitting: Physician Assistant

## 2015-10-08 ENCOUNTER — Ambulatory Visit (INDEPENDENT_AMBULATORY_CARE_PROVIDER_SITE_OTHER): Payer: Medicare HMO | Admitting: Physician Assistant

## 2015-10-08 ENCOUNTER — Telehealth: Payer: Self-pay | Admitting: Endocrinology

## 2015-10-08 VITALS — BP 126/78 | HR 107 | Ht 66.0 in | Wt 193.0 lb

## 2015-10-08 DIAGNOSIS — I483 Typical atrial flutter: Secondary | ICD-10-CM

## 2015-10-08 DIAGNOSIS — I4892 Unspecified atrial flutter: Secondary | ICD-10-CM | POA: Diagnosis not present

## 2015-10-08 DIAGNOSIS — I5031 Acute diastolic (congestive) heart failure: Secondary | ICD-10-CM

## 2015-10-08 DIAGNOSIS — R002 Palpitations: Secondary | ICD-10-CM

## 2015-10-08 DIAGNOSIS — I2510 Atherosclerotic heart disease of native coronary artery without angina pectoris: Secondary | ICD-10-CM | POA: Diagnosis not present

## 2015-10-08 DIAGNOSIS — I2583 Coronary atherosclerosis due to lipid rich plaque: Secondary | ICD-10-CM

## 2015-10-08 DIAGNOSIS — I10 Essential (primary) hypertension: Secondary | ICD-10-CM

## 2015-10-08 LAB — BASIC METABOLIC PANEL
BUN: 31 mg/dL — ABNORMAL HIGH (ref 7–25)
CO2: 29 mmol/L (ref 20–31)
Calcium: 9.7 mg/dL (ref 8.6–10.3)
Chloride: 101 mmol/L (ref 98–110)
Creat: 1.36 mg/dL — ABNORMAL HIGH (ref 0.70–1.25)
Glucose, Bld: 123 mg/dL — ABNORMAL HIGH (ref 65–99)
Potassium: 4.7 mmol/L (ref 3.5–5.3)
Sodium: 140 mmol/L (ref 135–146)

## 2015-10-08 LAB — HEMOGLOBIN A1C
Hgb A1c MFr Bld: 9 % — ABNORMAL HIGH (ref <5.7)
Mean Plasma Glucose: 212 mg/dL

## 2015-10-08 MED ORDER — METOPROLOL TARTRATE 50 MG PO TABS: 75.0000 mg | ORAL_TABLET | Freq: Two times a day (BID) | ORAL | Status: DC

## 2015-10-08 MED ORDER — ASPIRIN EC 81 MG PO TBEC: 81.0000 mg | DELAYED_RELEASE_TABLET | Freq: Every day | ORAL | Status: DC

## 2015-10-08 MED ORDER — INSULIN ISOPHANE HUMAN 100 UNIT/ML KWIKPEN: 26.0000 [IU] | PEN_INJECTOR | Freq: Every day | SUBCUTANEOUS | Status: DC

## 2015-10-08 NOTE — Telephone Encounter (Deleted)
Spoke with penny she is going to contact [facility] about the blood tests and see if they can add the tests.

## 2015-10-08 NOTE — Telephone Encounter (Signed)
They only did the BMP.  I need him to come in for lipid, microalbumin, lipid panel and A1c before his office visit

## 2015-10-08 NOTE — Telephone Encounter (Signed)
PT said that the Humulin N was written for the wrong amount, said it needs to be written for 26 units due to cost reasons.  [facility]

## 2015-10-08 NOTE — Telephone Encounter (Signed)
PT called to let you know he had his lab work done at the [facility] and they saw your orders and drew everything there.

## 2015-10-08 NOTE — Telephone Encounter (Signed)
[name] was able to add the A1C and lipid on the the blood tests from 6/12. Pt is coming in on 10/10/2015 for his microalbumin

## 2015-10-08 NOTE — Telephone Encounter (Signed)
Received call today from lab with a critical result on pt's labs from yesterday with a BS of 484. Spoke with pt and he states that he has missed a dose or two of his insulin because he had ran out of it. Spoke to pt about importance of keeping insulin on hand. Pt states he was having trouble with his insurance. Pt has follow up appt in our clinic today with Bonney Leitz, PA-C and he states that he plans to speak with her about it while he is here. Forwarded to pt's PCP and Endocrinologist.

## 2015-10-08 NOTE — Telephone Encounter (Signed)
I contacted the pt and advised rx has been sent with new directions.

## 2015-10-08 NOTE — Patient Instructions (Addendum)
Medication Instructions:  Your physician has recommended you make the following change in your medication:  1.  INCREASE the Metoprolol 50 mg taking 1 1/2 tablet twice a day 2.  STOP Aspirin 325 mg and START Aspirin 81 mg taking 1 tablet daily  Labwork: TODAY:  BMET 10/15/15:  BMET  Testing/Procedures: None ordered  Follow-Up: Your physician recommends that you schedule a follow-up appointment in: 10/23/15 ARRIVE AT 8:45 TO SEE Nell Range, PA-C Your physician recommends that you schedule a follow-up appointment in: SEE DR. Lovena Le AT HIS 1ST AVAILABLE (AFTER 10/26/15)    Any Other Special Instructions Will Be Listed Below (If Applicable).     If you need a refill on your cardiac medications before your next appointment, please call your pharmacy.

## 2015-10-08 NOTE — Telephone Encounter (Signed)
See below to be advise.

## 2015-10-09 ENCOUNTER — Telehealth: Payer: Self-pay | Admitting: *Deleted

## 2015-10-09 LAB — LIPID PANEL
Cholesterol: 221 mg/dL — ABNORMAL HIGH (ref 125–200)
HDL: 52 mg/dL (ref >=40)
LDL Cholesterol: 147 mg/dL — ABNORMAL HIGH (ref <130)
Total CHOL/HDL Ratio: 4.3 Ratio (ref <=5.0)
Triglycerides: 110 mg/dL (ref <150)
VLDL: 22 mg/dL (ref <30)

## 2015-10-09 MED ORDER — FUROSEMIDE 40 MG PO TABS: 20.0000 mg | ORAL_TABLET | Freq: Every day | ORAL | Status: DC

## 2015-10-09 NOTE — Telephone Encounter (Signed)
-----   Message from Eileen Stanford, PA-C sent at 10/09/2015  7:17 AM EDT ----- Creat bumped a little. Potassium still looks good. Lets go down to lasix 20mg  daily.

## 2015-10-09 NOTE — Telephone Encounter (Signed)
Pt aware of of test results   He has been made aware to reduce lasix to 20 mg daily.  Pt verbalized understanding.

## 2015-10-10 ENCOUNTER — Other Ambulatory Visit: Payer: Medicare HMO

## 2015-10-10 ENCOUNTER — Other Ambulatory Visit (INDEPENDENT_AMBULATORY_CARE_PROVIDER_SITE_OTHER): Payer: Medicare HMO

## 2015-10-10 DIAGNOSIS — Z794 Long term (current) use of insulin: Secondary | ICD-10-CM

## 2015-10-10 DIAGNOSIS — E1165 Type 2 diabetes mellitus with hyperglycemia: Secondary | ICD-10-CM | POA: Diagnosis not present

## 2015-10-10 LAB — COMPREHENSIVE METABOLIC PANEL
ALT: 25 U/L (ref 0–53)
AST: 25 U/L (ref 0–37)
Albumin: 3.7 g/dL (ref 3.5–5.2)
Alkaline Phosphatase: 84 U/L (ref 39–117)
BUN: 32 mg/dL — ABNORMAL HIGH (ref 6–23)
CO2: 32 mEq/L (ref 19–32)
Calcium: 9.3 mg/dL (ref 8.4–10.5)
Chloride: 98 mEq/L (ref 96–112)
Creatinine, Ser: 1.14 mg/dL (ref 0.40–1.50)
GFR: 68 mL/min (ref >60.00)
Glucose, Bld: 228 mg/dL — ABNORMAL HIGH (ref 70–99)
Potassium: 4.7 mEq/L (ref 3.5–5.1)
Sodium: 135 mEq/L (ref 135–145)
Total Bilirubin: 0.5 mg/dL (ref 0.2–1.2)
Total Protein: 6.9 g/dL (ref 6.0–8.3)

## 2015-10-10 LAB — LIPID PANEL
Cholesterol: 221 mg/dL — ABNORMAL HIGH (ref 0–200)
HDL: 41.9 mg/dL (ref >39.00)
LDL Cholesterol: 156 mg/dL — ABNORMAL HIGH (ref 0–99)
NonHDL: 178.63
Total CHOL/HDL Ratio: 5
Triglycerides: 114 mg/dL (ref 0.0–149.0)
VLDL: 22.8 mg/dL (ref 0.0–40.0)

## 2015-10-11 LAB — FRUCTOSAMINE: Fructosamine: 368 umol/L — ABNORMAL HIGH (ref 0–285)

## 2015-10-14 ENCOUNTER — Ambulatory Visit: Payer: Medicare HMO

## 2015-10-15 ENCOUNTER — Encounter: Payer: Self-pay | Admitting: Endocrinology

## 2015-10-15 ENCOUNTER — Other Ambulatory Visit: Payer: Self-pay | Admitting: Cardiovascular Disease

## 2015-10-15 ENCOUNTER — Ambulatory Visit (INDEPENDENT_AMBULATORY_CARE_PROVIDER_SITE_OTHER): Payer: Medicare HMO | Admitting: Endocrinology

## 2015-10-15 ENCOUNTER — Ambulatory Visit (INDEPENDENT_AMBULATORY_CARE_PROVIDER_SITE_OTHER): Payer: Medicare HMO | Admitting: Pharmacist

## 2015-10-15 ENCOUNTER — Other Ambulatory Visit (INDEPENDENT_AMBULATORY_CARE_PROVIDER_SITE_OTHER): Payer: Medicare HMO

## 2015-10-15 VITALS — BP 122/84 | HR 91 | Ht 66.0 in | Wt 197.0 lb

## 2015-10-15 DIAGNOSIS — R002 Palpitations: Secondary | ICD-10-CM

## 2015-10-15 DIAGNOSIS — Z794 Long term (current) use of insulin: Secondary | ICD-10-CM | POA: Diagnosis not present

## 2015-10-15 DIAGNOSIS — E785 Hyperlipidemia, unspecified: Secondary | ICD-10-CM | POA: Diagnosis not present

## 2015-10-15 DIAGNOSIS — E1165 Type 2 diabetes mellitus with hyperglycemia: Secondary | ICD-10-CM

## 2015-10-15 DIAGNOSIS — I483 Typical atrial flutter: Secondary | ICD-10-CM | POA: Diagnosis not present

## 2015-10-15 DIAGNOSIS — I4892 Unspecified atrial flutter: Secondary | ICD-10-CM | POA: Diagnosis not present

## 2015-10-15 LAB — BASIC METABOLIC PANEL
BUN: 21 mg/dL (ref 7–25)
CO2: 28 mmol/L (ref 20–31)
Calcium: 9 mg/dL (ref 8.6–10.3)
Chloride: 99 mmol/L (ref 98–110)
Creat: 0.96 mg/dL (ref 0.70–1.25)
Glucose, Bld: 270 mg/dL — ABNORMAL HIGH (ref 65–99)
Potassium: 5.4 mmol/L — ABNORMAL HIGH (ref 3.5–5.3)
Sodium: 135 mmol/L (ref 135–146)

## 2015-10-15 MED ORDER — ROSUVASTATIN CALCIUM 5 MG PO TABS: 5.0000 mg | ORAL_TABLET | Freq: Every day | ORAL | Status: DC

## 2015-10-15 NOTE — Patient Instructions (Addendum)
Check sugar 4x daily  N insulin steady at 18 U and adjust based on am sugar  Divide carbs by 5 + extra 30% for hi fat meals

## 2015-10-15 NOTE — Progress Notes (Signed)
Patient ID: Matthew [name], male   DOB: April 27, 1946, 70 y.o.   MRN: [id number]           Reason for Appointment: Followup for Type 2 Diabetes  Referring physician: Criss Rosales  History of Present Illness:          Diagnosis: Type 2 diabetes mellitus, date of diagnosis:  1992      Past history: His blood sugar was high at diagnosis when he was having a routine screening done. He was started on Glucophage initially which he took for about a year. He thinks it did not help his sugar much and he did not feel good with it Apparently he was trying to control his diabetes with diet and exercise for a few years. He may have tried Glucophage again before going on insulin. Also was started on Actos which caused swelling He has been treated mostly with premixed insulin for about 15 years and his level of control appears to be inadequate although details of previous treatment are not available. He may have taken Humalog at one time for use with high sugars A1c was 11.5 in 2011 On his initial consultation in 9/15 because of poor control with premixed insulin he was switched to Levemir twice a day and Humalog with meals.  Recent history:   INSULIN regimen is described as: 18-23 N hs; Humalog 14-15 units 3 times a day with meals   His A1c continues to be over 8% and now 9%   Current management, blood sugar patterns and problems:   He was switched from Toujeo to NPH at bedtime to cover overnight hyperglycemia this has been relatively better.  More recently he says that he has had a lot of stress in the last couple months and has not.  Attention to his diabetes  Checking blood sugar very sporadically and mostly when he does not feel good, has a few readings in the morning and when he is feeling hypoglycemic.  FASTING blood sugars are now mostly high although this morning it was low at 47 at 5:30 AM  He also has had recent overnight hypoglycemia twice at either 1 or 2 AM approximately  Despite having  had diabetes education he is adjusting his bedtime NPH based on how much he is eating at night  He has got out of insulin for a week late last month and blood sugars were as high as 505  He has not been very active in the last few weeks, usually has significant exercise regimen  He is taking about the same amount of insulin for all his meals despite variable intake  He had an episode of hypoglycemia on Saturday night with blood sugar going down to 33 with symptoms  Usually not checking sugars after meals, has a few readings around lunchtime mostly high and a couple of readings around suppertime when he ran out of insulin  With hypoglycemia he has symptoms of weakness, sweating, shakiness and confusion Treating low sugars with orange juice       Oral hypoglycemic drugs the patient is taking are: None      Side effects from medications have been: Edema from Actos,? Nausea and malaise from metformin Compliance with the medical regimen: Fair  Glucose monitoring:  done ?  one time a day         Glucometer: One Touch.      Blood Glucose readings by  download:  :  Mean values apply above for all meters except median for One  Touch  PRE-MEAL   Fasting Lunch Dinner Bedtime Average   Glucose range: 47-176    ?   Mean/median: 245      165+/-148    Self-care:   Meals: 3 meals per day.  Bfst 8:30, 1 pm Dinner is about 8 PM.  Some meals are high fat      Exercise: Previously playing Racquetball about 3-4 days a week mostly in morning before breakfast , sometimes late afternoon           Dietician visit, most recent:?  15 years ago.     CDE visit: 3/16            Weight history:  Wt Readings from Last 3 Encounters:  10/15/15 197 lb (89.359 kg)  10/08/15 193 lb (87.544 kg)  07/18/15 199 lb 9.6 oz (90.538 kg)    Glycemic control:   Lab Results  Component Value Date   HGBA1C 9.0* 10/07/2015   HGBA1C 8.6 07/18/2015   HGBA1C 8.8 12/21/2014   Lab Results  Component Value Date    MICROALBUR 3.4* 07/18/2015   LDLCALC 156* 10/10/2015   CREATININE 0.96 10/15/2015         Medication List       This list is accurate as of: 10/15/15  8:25 PM.  Always use your most recent med list.               apixaban 5 MG Tabs tablet  Commonly known as:  ELIQUIS  Take 1 tablet (5 mg total) by mouth 2 (two) times daily.     aspirin EC 81 MG tablet  Take 1 tablet (81 mg total) by mouth daily.     BD PEN NEEDLE NANO U/F 32G X 4 MM Misc  Generic drug:  Insulin Pen Needle  Use 3-4 per day to inject insulin     bimatoprost 0.01 % Soln  Commonly known as:  LUMIGAN  Place 1 drop into the left eye at bedtime.     cholecalciferol 1000 units tablet  Commonly known as:  VITAMIN D  Take 5,000 Units by mouth daily. Reported on 07/18/2015     Co Q 10 100 MG Caps  Take 1 capsule by mouth daily.     furosemide 40 MG tablet  Commonly known as:  LASIX  Take 0.5 tablets (20 mg total) by mouth daily.     glucose blood test strip  Commonly known as:  ONETOUCH VERIO  Use as instructed to check blood sugar 2 times per day dx code E11.59     insulin lispro 100 UNIT/ML KiwkPen  Commonly known as:  HUMALOG KWIKPEN  Inject 0.12-0.15 mLs (12-15 Units total) into the skin 3 (three) times daily.     Insulin NPH (Human) (Isophane) 100 UNIT/ML Kiwkpen  Commonly known as:  HUMULIN N  Inject 26 Units into the skin daily.     KLOR-CON M20 20 MEQ tablet  Generic drug:  potassium chloride SA  Take 1 tablet (20 mEq total) by mouth daily.     lisinopril 5 MG tablet  Commonly known as:  PRINIVIL,ZESTRIL  TAKE 1 TABLET BY MOUTH EVERY DAY     metoprolol 50 MG tablet  Commonly known as:  LOPRESSOR  Take 1.5 tablets (75 mg total) by mouth 2 (two) times daily.     ONETOUCH DELICA LANCETS 99991111 Misc  Use to check blood sugar 2 times per day dx code E11.59     rosuvastatin 5 MG tablet  Commonly known  as:  CRESTOR  Take 1 tablet (5 mg total) by mouth daily. Or as directed     TURMERIC PO    Take 450 mg by mouth daily.        Allergies:  Allergies  Allergen Reactions  . Actos [Pioglitazone] Swelling  . Bee Venom Swelling  . Codeine Nausea Only    REACTION: hives  . Statins   . Glucophage [Metformin Hcl] Nausea Only  . Raspberry Itching    Past Medical History  Diagnosis Date  . Hyperlipidemia     a. Intolerant of lipitor and vytorin.  . Glaucoma   . Diabetic retinopathy (HCC)     mild- Dr. Herbert Deaner  . Coronary artery disease     a. 05/2009 CABG x 3: LIMA->LAD, VG->OM, VG->PDA; b. Nuc 01/2014: inf-lat scar but no ischemia, EF 57%.  . Valvular heart disease     a. 01/2014 Echo: Ef 60-65%, no rwma, mild AI/MS, mod MR, mildly dil LA.  . Paroxysmal atrial flutter ([place])     a. 01/2014 s/p DCCV;  b. CHA2DS2VASc = 3-->Eliquis.  . Testicular cancer ([place]) 1982    "heavy doses of radiation; it was stage 2"  . CHF (congestive heart failure) ([place])   . Myocardial infarction [facility])     'saw evidence of it on an EKG done in 2005"  . Type II diabetes mellitus ([place])   . Pneumonia 1960  . Anemia   . Migraine     "once or twice" (10/31/2014)  . Sacral pain     "right"  . Sciatica of right side   . PTSD (post-traumatic stress disorder)     "X 2 yr post OHS" (10/31/2014)    Past Surgical History  Procedure Laterality Date  . Elbow fracture surgery Left     broken ulna on left elbow-surgical repair  . Cardiac catheterization  2011  . Coronary artery bypass graft  2011    LIMA-LAD, SVG-OM, SVG-PDA 06/20/09  . Cardioversion N/A 02/19/2014    Procedure: CARDIOVERSION;  Surgeon: [name], MD;  Location[facility] ENDOSCOPY;  Service: Cardiovascular;  Laterality: N/A;  . Abdominal exploration surgery  ~ 2005    "for hernia, but didn't have one"  . Fracture surgery    . Tonsillectomy  ~ 1967  . Testicle removal Right 1982  . Electrophysiologic study N/A 10/31/2014    Procedure: A-Flutter;  Surgeon: [name], MD;  Location: [place] CV LAB;  Service: Cardiovascular;   Laterality: N/A;    Family History  Problem Relation Age of Onset  . Heart disease Father   . Heart attack Father   . Diabetes Mother   . Diabetes Father   . Diabetes Sister   . Hypertension Father     Social History:  reports that he has quit smoking. His smoking use included Cigarettes. He smoked 0.00 packs per day for 0 years. He has never used smokeless tobacco. He reports that he drinks about 3.6 oz of alcohol per week. He reports that he does not use illicit drugs.    Review of Systems   Unknown date of last eye exam       Lipids: He was previously on Vytorin and Lipitor. He thinks he had myalgias and weakness when taking these drugs.  He has been recommended statin drugs in the lipid clinic but he refuses to take them stating that they will not help him.  Also has significantly increased LDL particle number  Also had been reluctant  to take even Zetia   Now has been followed by cardiologist   Lab Results  Component Value Date   CHOL 221* 10/10/2015   HDL 41.90 10/10/2015   LDLCALC 156* 10/10/2015   TRIG 114.0 10/10/2015   CHOLHDL 5 10/10/2015    He has been  treated by cardiologist for his atrial fibrillation, is going to get cardioversion He says he feels fatigued with metoprolol higher doses  Physical Examination:  BP 122/84 mmHg  Pulse 91  Ht 5\' 6"  (1.676 m)  Wt 197 lb (89.359 kg)  BMI 31.81 kg/m2  SpO2 97%  No pedal edema  ASSESSMENT:  Diabetes type 2, uncontrolled  He has had long-standing diabetes on insulin for over 15 years with usually poor control See history of present illness for details of his current management of problems identified in blood sugar patterns   His blood sugars are extremely variable but partly related to his noncompliance with insulin, inconsistent insulin doses and arbitrarily adjusting bedtime NPH as discussed above He is having frequent hypoglycemia also including late night and overnight most likely because taking more  NPH and mealtime insulin both when eating larger meals in the evenings He does check blood sugar very sporadically and difficult to get a pattern His bedtime readings are not checked after supper Fasting readings are mostly high but also this morning at low sugar at 5:30 AM  HYPERLIPIDEMIA with LDL 147,  followed by cardiologist and previously refusing to take treatment   PLAN:  Start monitoring blood sugars consistently at various times of the day including  after supper Take only consistent amount of NPH at bedtime and only adjust based on fasting readings given low chart to adjust bedtime dose based on fasting blood sugar pattern Tried to use 1:5 carbohydrate coverage for meals and extra 30% for higher fat meals He can discuss mealtime insulin calculation with nurse educator again.  For now he will keep a three-day diary of blood sugars before and 2 hours after eating, keep a record of his food eaten and insulin dose He was recommended a trial of Tresiba instead of NPH but he wants to use use up his supply of NPH before switching Consider continuous glucose monitoring again He is also still reluctant to use an insulin pump and discussed that this may be his best option   For his Hyperlipidemia recommend that he start taking Zetia although he still is refusing to consider any medications   Patient Instructions  Check sugar 4x daily  N insulin steady at 18 U and adjust based on am sugar  Divide carbs by 5 + extra 30% for hi fat meals     Counseling time on subjects discussed above is over 50% of today's 25 minute visit      KUMAR,AJAY 10/15/2015, 8:25 PM   Note: This office note was prepared with Estate agent. Any transcriptional errors that result from this process are unintentional.

## 2015-10-15 NOTE — Progress Notes (Signed)
Patient ID: Matthew [name]                 DOB: [birth year]                    MRN: [id number]     HPI: [name] is a 70 y.o. male patient of Dr. Irish Lack who was referred to the Lipid clinic for history of statin intolerance.  His PMH is significant for CAD s/p CABG x3V in 2011, DM, HLD, and Aflutter s/p ablation in 2016.  Pt states he is adamant he does not want to take a statin again.  He is not trusting of the data that shows their benefit in pts for secondary prevention.  He has tried Lipitor and Vytorin in the past and experienced myalgias and fatigue with each of these that resolved upon discontinuation.    His main complaint today is regarding his metoprolol.  He states it is causing him increased fatigue, swelling and vivid dreams.  He states this has occurred since he started the medication, but this was several years ago and no mention of SE prior to today.   Current Medications: none Intolerances: atorvastatin 10mg  (myalgias), Vytorin (myalgias) Risk Factors:CAD, DM LDL goal: <70 mg/dL   Labs: 10/10/15- TC 221, TG 114, HDL 42, LDL 156, LFTs normal (on no medications)  Past Medical History  Diagnosis Date  . Hyperlipidemia     a. Intolerant of lipitor and vytorin.  . Glaucoma   . Diabetic retinopathy (HCC)     mild- Dr. Herbert Deaner  . Coronary artery disease     a. 05/2009 CABG x 3: LIMA->LAD, VG->OM, VG->PDA; b. Nuc 01/2014: inf-lat scar but no ischemia, EF 57%.  . Valvular heart disease     a. 01/2014 Echo: Ef 60-65%, no rwma, mild AI/MS, mod MR, mildly dil LA.  . Paroxysmal atrial flutter ([place])     a. 01/2014 s/p DCCV;  b. CHA2DS2VASc = 3-->Eliquis.  . Testicular cancer ([place]) 1982    "heavy doses of radiation; it was stage 2"  . CHF (congestive heart failure) ([place])   . Myocardial infarction [facility])     'saw evidence of it on an EKG done in 2005"  . Type II diabetes mellitus ([place])   . Pneumonia 1960  . Anemia   . Migraine     "once or twice" (10/31/2014)  . Sacral  pain     "right"  . Sciatica of right side   . PTSD (post-traumatic stress disorder)     "X 2 yr post OHS" (10/31/2014)    Current Outpatient Prescriptions on File Prior to Visit  Medication Sig Dispense Refill  . apixaban (ELIQUIS) 5 MG TABS tablet Take 1 tablet (5 mg total) by mouth 2 (two) times daily.    Marland Kitchen aspirin EC 81 MG tablet Take 1 tablet (81 mg total) by mouth daily. (Patient not taking: Reported on 10/15/2015) 90 tablet 3  . BD PEN NEEDLE NANO U/F 32G X 4 MM MISC Use 3-4 per day to inject insulin 200 each 1  . bimatoprost (LUMIGAN) 0.01 % SOLN Place 1 drop into the left eye at bedtime.    . cholecalciferol (VITAMIN D) 1000 UNITS tablet Take 5,000 Units by mouth daily. Reported on 07/18/2015    . Coenzyme Q10 (CO Q 10) 100 MG CAPS Take 1 capsule by mouth daily. 90 capsule 1  . furosemide (LASIX) 40 MG tablet Take 0.5 tablets (20 mg total) by mouth daily. 45 tablet  3  . glucose blood (ONETOUCH VERIO) test strip Use as instructed to check blood sugar 2 times per day dx code E11.59 200 each 1  . insulin lispro (HUMALOG KWIKPEN) 100 UNIT/ML KiwkPen Inject 0.12-0.15 mLs (12-15 Units total) into the skin 3 (three) times daily. 45 mL 1  . Insulin NPH, Human,, Isophane, (HUMULIN N) 100 UNIT/ML Kiwkpen Inject 26 Units into the skin daily. 15 mL 2  . lisinopril (PRINIVIL,ZESTRIL) 5 MG tablet TAKE 1 TABLET BY MOUTH EVERY DAY 30 tablet 0  . metoprolol (LOPRESSOR) 50 MG tablet Take 1.5 tablets (75 mg total) by mouth 2 (two) times daily. 270 tablet 1  . ONETOUCH DELICA LANCETS 99991111 MISC Use to check blood sugar 2 times per day dx code E11.59 200 each 1  . potassium chloride SA (KLOR-CON M20) 20 MEQ tablet Take 1 tablet (20 mEq total) by mouth daily.    . TURMERIC PO Take 450 mg by mouth daily.      No current facility-administered medications on file prior to visit.    Allergies  Allergen Reactions  . Actos [Pioglitazone] Swelling  . Bee Venom Swelling  . Codeine Nausea Only    REACTION:  hives  . Statins   . Glucophage [Metformin Hcl] Nausea Only  . Raspberry Itching    Assessment/Plan:  1.  Hyperlipidemia- Discussed options with patient including low dose Crestor, PCSK-9 inhibitor, or study.  He does not think he can afford PCSK-9 inhibitors.  He does not want to do a study due to them being double blinded.  He is willing to retry low dose Crestor.  Will start at 5mg  three days of the week and titrate as needed to highest tolerated dose.  Plan to recheck labs in 6 weeks.   2.  Side effects with metoprolol- unsure if pt's fatigue and swelling are related to metoprolol versus being back in aflutter.  Pt has taken metoprolol for years.  Will send message to Dr. Lovena Le to discuss switching agents.

## 2015-10-16 ENCOUNTER — Telehealth: Payer: Self-pay | Admitting: Pharmacist

## 2015-10-16 NOTE — Telephone Encounter (Signed)
Pt aware of lab results.  He will continue to stay off the Potassium until his o/v 5/28 and we will recheck at that time.

## 2015-10-16 NOTE — Telephone Encounter (Signed)
Following messag was placed in a refill encounter.  Created a phone note to ensure appropriate follow up.   Charlett Nose at 10/16/2015 8:39 AM     Status: Signed       Expand All Collapse All   New message   Pt is calling for his lab results

## 2015-10-16 NOTE — Telephone Encounter (Signed)
New message   Pt is calling for his lab results

## 2015-10-21 ENCOUNTER — Other Ambulatory Visit: Payer: Self-pay | Admitting: *Deleted

## 2015-10-21 MED ORDER — APIXABAN 5 MG PO TABS: 5.0000 mg | ORAL_TABLET | Freq: Two times a day (BID) | ORAL | Status: DC

## 2015-10-21 NOTE — Addendum Note (Signed)
Addended by: Roberts Gaudy on: 10/21/2015 02:15 PM   Modules accepted: Orders

## 2015-10-23 ENCOUNTER — Ambulatory Visit: Payer: Medicare HMO | Admitting: Physician Assistant

## 2015-10-23 NOTE — Progress Notes (Signed)
Cardiology Office Note    Date:  10/24/2015   ID:  Matthew [name], DOB [birth year], MRN [id number]  PCP:  Elyn Peers, MD  Cardiologist: Dr. Irish Lack / Dr. Lovena Le (EP)  CC: follow up atrial flutter with RVR  History of Present Illness:  Matthew [name] is a 70 y.o. male with a history of CAD s/p CABG x3V (2011), DMT2, HLD, PAFlutter s/p ablation (10/2014) who presents to clinic for follow up of recurrent atrial flutter w/ RVR and A/C D CHF.   He was initially diagnosed with afib vs aflutter in 01/2014. He felt fatigued. Eliquis was started by his PCP prior to this visit. TSH normal. Metoprolol dose was increased. Myoview was obtained.This demonstrated inferolateral scar but no ischemia. Med Rx was recommended. Echo demonstrated normal LVF with mild AI/mod MR/mild MS. He underwent DCCV later that month.   He was seen by Ignacia Bayley NP in 09/2014 for evaluation of recurrent atrial flutter and acute diastolic CHF. He was started on metoprolol and lasix. He was referred to EP for ablation. He underwent RFCA on 10/31/14 and came off his eliquis.   He was in his usual state of health until about 8-12 weeks ago when he started to notice abdominal distention as well as fatigue and weakness. He also had some intermittent LE edema. He has also felt his heart skipping and hasn't been able to to find his pulse. He called in on Friday 10/04/15 and was advised to resume lasix, potassium and eliquis. Lab work was obtained which showed slightly high potassium and he was instructed to hold his potassium supplementation.  I saw him in the clinic on 10/08/15 for follow up. ECG showed aflutter with var AV block. HR 107. He was feeling a little better after lasix. I increased lopressor 50mg  BID to 75 mg BID and increase lasix to 40mg  daily with plan for DCCV after 3 weeks of uninterrupted eliquis. BMET that day showed an increase in creat to 1.36 and lasix decreased from 40mg  daily to 20mg  daily. Repeat BMET on  10/15/15 showed K 5.4 and creat 0.96. He was told to permanently discontinue his Kdur.   He saw [name] Pharm D on 10/15/15 in the lipid clinic due to hx of intolerance of statin drugs and CAD. He was started on low dose Crestor. He also complained of intolerance of metoprolol with fatigue, swelling and strange dreams. [name] wondered if it was really due to the medication or him being in atrial flutter. She sent a note to Dr. Lovena Le for further input. He was added onto Dr. Tanna Furry schedule on 10/30/15.  Today he presents to clinic for follow up. He remains in aflutter with RVR. HR 127. He still feels poorly. He is not taking lasix everyday due to it being hard to do daily activities when he has to urinate all the time. He denies LE edema, orthopnea or PND. No CP. No palpitations. He just feels fatigued and SOB. Just feels "terrible." He has not missed any doses of Eliquis.    Past Medical History  Diagnosis Date  . Hyperlipidemia     a. Intolerant of lipitor and vytorin.  . Glaucoma   . Diabetic retinopathy (HCC)     mild- Dr. Herbert Deaner  . Coronary artery disease     a. 05/2009 CABG x 3: LIMA->LAD, VG->OM, VG->PDA; b. Nuc 01/2014: inf-lat scar but no ischemia, EF 57%.  . Valvular heart disease     a. 01/2014 Echo: Ef 60-65%, no  rwma, mild AI/MS, mod MR, mildly dil LA.  . Paroxysmal atrial flutter ([place])     a. 01/2014 s/p DCCV;  b. CHA2DS2VASc = 3-->Eliquis.  . Testicular cancer ([place]) 1982    "heavy doses of radiation; it was stage 2"  . CHF (congestive heart failure) ([place])   . Myocardial infarction [facility])     'saw evidence of it on an EKG done in 2005"  . Type II diabetes mellitus ([place])   . Pneumonia 1960  . Anemia   . Migraine     "once or twice" (10/31/2014)  . Sacral pain     "right"  . Sciatica of right side   . PTSD (post-traumatic stress disorder)     "X 2 yr post OHS" (10/31/2014)    Past Surgical History  Procedure Laterality Date  . Elbow fracture surgery Left     broken ulna  on left elbow-surgical repair  . Cardiac catheterization  2011  . Coronary artery bypass graft  2011    LIMA-LAD, SVG-OM, SVG-PDA 06/20/09  . Cardioversion N/A 02/19/2014    Procedure: CARDIOVERSION;  Surgeon: [name], MD;  Location: [facility] ENDOSCOPY;  Service: Cardiovascular;  Laterality: N/A;  . Abdominal exploration surgery  ~ 2005    "for hernia, but didn't have one"  . Fracture surgery    . Tonsillectomy  ~ 1967  . Testicle removal Right 1982  . Electrophysiologic study N/A 10/31/2014    Procedure: A-Flutter;  Surgeon: [name], MD;  Location: [place] CV LAB;  Service: Cardiovascular;  Laterality: N/A;    Current Medications: Outpatient Prescriptions Prior to Visit  Medication Sig Dispense Refill  . apixaban (ELIQUIS) 5 MG TABS tablet Take 1 tablet (5 mg total) by mouth 2 (two) times daily. 180 tablet 3  . BD PEN NEEDLE NANO U/F 32G X 4 MM MISC Use 3-4 per day to inject insulin 200 each 1  . bimatoprost (LUMIGAN) 0.01 % SOLN Place 1 drop into the left eye at bedtime.    . Coenzyme Q10 (CO Q 10) 100 MG CAPS Take 1 capsule by mouth daily. 90 capsule 1  . furosemide (LASIX) 40 MG tablet Take 0.5 tablets (20 mg total) by mouth daily. 45 tablet 3  . glucose blood (ONETOUCH VERIO) test strip Use as instructed to check blood sugar 2 times per day dx code E11.59 200 each 1  . insulin lispro (HUMALOG KWIKPEN) 100 UNIT/ML KiwkPen Inject 0.12-0.15 mLs (12-15 Units total) into the skin 3 (three) times daily. 45 mL 1  . Insulin NPH, Human,, Isophane, (HUMULIN N) 100 UNIT/ML Kiwkpen Inject 26 Units into the skin daily. 15 mL 2  . lisinopril (PRINIVIL,ZESTRIL) 5 MG tablet TAKE 1 TABLET BY MOUTH EVERY DAY 30 tablet 0  . metoprolol (LOPRESSOR) 50 MG tablet Take 1.5 tablets (75 mg total) by mouth 2 (two) times daily. 270 tablet 1  . ONETOUCH DELICA LANCETS 99991111 MISC Use to check blood sugar 2 times per day dx code E11.59 200 each 1  . potassium chloride SA (KLOR-CON M20) 20 MEQ tablet  Take 1 tablet (20 mEq total) by mouth daily.    . TURMERIC PO Take 450 mg by mouth daily.     Marland Kitchen aspirin EC 81 MG tablet Take 1 tablet (81 mg total) by mouth daily. 90 tablet 3  . cholecalciferol (VITAMIN D) 1000 UNITS tablet Take 5,000 Units by mouth daily. Reported on 07/18/2015    . rosuvastatin (CRESTOR) 5 MG tablet TAKE 1 TABLET(5  MG) BY MOUTH DAILY OR AS DIRECTED 90 tablet 1   No facility-administered medications prior to visit.     Allergies:   Actos; Bee venom; Codeine; Statins; Glucophage; and Raspberry   Social History   Social History  . Marital Status: Married    Spouse Name: N/A  . Number of Children: N/A  . Years of Education: N/A   Social History Main Topics  . Smoking status: Former Smoker -- 0.00 packs/day for 0 years    Types: Cigarettes  . Smokeless tobacco: Never Used     Comment: "quit smoking cigarettes in the 1980's; don't know how much or for how long"  . Alcohol Use: 3.6 oz/week    6 Cans of beer per week  . Drug Use: No  . Sexual Activity: Yes    Birth Control/ Protection: Coitus interruptus   Other Topics Concern  . None   Social History Narrative     Family History:  The patient's family history includes Diabetes in his father, mother, and sister; Heart attack in his father; Heart disease in his father; Hypertension in his father.   ROS:   Please see the history of present illness.    ROS All other systems reviewed and are negative.   PHYSICAL EXAM:   VS:  BP 138/90 mmHg  Pulse 127  Ht 5\' 6"  (1.676 m)  Wt 165 lb (74.844 kg)  BMI 26.64 kg/m2  SpO2 97%   GEN: Well nourished, well developed, in no acute distress HEENT: normal Neck: no JVD, carotid bruits, or masses Cardiac: irreg irreg; tachy no murmurs, rubs, or gallops,no edema  Respiratory:  clear to auscultation bilaterally, normal work of breathing GI: soft, nontender, nondistended, + BS MS: no deformity or atrophy Skin: warm and dry, no rash Neuro:  Alert and Oriented x 3,  Strength and sensation are intact Psych: euthymic mood, full affect  Wt Readings from Last 3 Encounters:  10/24/15 165 lb (74.844 kg)  10/15/15 197 lb (89.359 kg)  10/08/15 193 lb (87.544 kg)      Studies/Labs Reviewed:   EKG:  EKG is ordered today.  The ekg ordered today demonstrates atrail flutter HR 127.  Recent Labs: 10/07/2015: Hemoglobin 12.6*; Magnesium 1.9; Platelets 253; TSH 2.97 10/10/2015: ALT 25 10/15/2015: BUN 21; Creat 0.96; Potassium 5.4*; Sodium 135   Lipid Panel    Component Value Date/Time   CHOL 221* 10/10/2015 0833   CHOL 216* 06/21/2013 1405   TRIG 114.0 10/10/2015 0833   TRIG 132 06/21/2013 1405   HDL 41.90 10/10/2015 0833   HDL 54 06/21/2013 1405   CHOLHDL 5 10/10/2015 0833   VLDL 22.8 10/10/2015 0833   LDLCALC 156* 10/10/2015 0833   LDLCALC 136* 06/21/2013 1405    Additional studies/ records that were reviewed today include:  2D ECHO 02/05/2014 LV EF: 60% -  65% Study Conclusion - Left ventricle: The cavity size was normal. There was mild focal basal hypertrophy of the septum. Systolic function was normal.The estimated ejection fraction was in the range of 60% to 65%. Wall motion was normal; there were no regional wall motion abnormalities. - Aortic valve: Valve mobility was restricted. There was mild regurgitation. Mean gradient (S): 7 mm Hg. Peak gradient (S): 12 mm Hg. Valve area (VTI): 2.03 cm^2. Valve area (Vmean): 2.02 cm^2. - Mitral valve: Calcified annulus. Mildly thickened leaflets . The findings are consistent with mild stenosis. There was moderate regurgitation. Valve area by continuity equation (using LVOT flow): 2.29 cm^2. - Left atrium: The  atrium was mildly dilated. - Right ventricle: Systolic function was mildly reduced. - Right atrium: The atrium was mildly dilated. Impressions: - Normal LV function; mild biatrial enlargement; thickened MV; moderate MR; mild MS by mean gradient (6 mmHg); calcified  aortic valve but no significant AS by doppler; mild AI.   01/2014 Myoview Overall Impression: Intermediate risk stress nuclear study with large-sized (Extent 18%), severe intensity fixed inferolateral defect consitent with LCx territory scar. LV Ejection Fraction: 57%. LV Wall Motion: inferolateral akinesis  ASSESSMENT & PLAN:    Paroxysmal atrial flutter: s/p previous ablation in 10/2014 by Dr. Lovena Le. Found to have recurrent flutter that was clearly symptomatic. He was restarted on Eliquis on 10/04/15. Lopressor increased from 50mg  BID to 75 mg BID with plan for DCCV after 3 weeks of uninterrupted eliquis. Today he remains in aflutter with HR 127. BP 138/90. Will increase lopressor to 100mg  BID and set up for DCCV on 10/28/15. He will follow with Dr. Lovena Le on 0000000  Acute diastolic CHF: Normal LV function by echo in October 2015. He seems to get volume overloaded when he goes into flutter. Has been having some abdominal distension and LE edema which has improved on lasix 40mg  daily. This was decreased to 20mg  daily due to slight increase in creat 1.16--> 1.36. This improved on Lasix 20mg  daily. Appears euvolemic today. He has a very good idea of his dry weight and s/s CHF. He can take Lasix 40mg  PRN for s/s CHF or weight gain   Hyperkalemia: K was held. Will check BMET today.   HTN: Stable.   Type 2 diabetes mellitus: Followed by endocrinology.  HLD: His LDL of 147. He was recently was seen in the lipid clinc by [name] Pharm-D and disucussed low dose Crestor vs PCSK9i. He felt the PCSK9i would be too expensive and opted for low dose Crestor. HE has been tolerating this well so far.   CAD s/p CABG x3V (2011): will decrease ASA from 325mg  --> 81mg  daily. Continue BB. Intolerant to statins.   Medication Adjustments/Labs and Tests Ordered: Current medicines are reviewed at length with the patient today.  Concerns regarding medicines are outlined above.  Medication changes, Labs and  Tests ordered today are listed in the Patient Instructions below. Patient Instructions  Medication Instructions:  Your physician has recommended you make the following change in your medication:  1.  DECREASE the Lasix    Labwork: TODAY:  BMET, CBC W/DIFF, & PT/INR  Testing/Procedures: Your physician has recommended that you have a Cardioversion (DCCV). Electrical Cardioversion uses a jolt of electricity to your heart either through paddles or wired patches attached to your chest. This is a controlled, usually prescheduled, procedure. Defibrillation is done under light anesthesia in the hospital, and you usually go home the day of the procedure. This is done to get your heart back into a normal rhythm. You are not awake for the procedure. Please see the instruction sheet given to you today.   Follow-Up: Your physician recommends that you schedule a follow-up appointment in: [place]   Any Other Special Instructions Will Be Listed Below (If Applicable). Electrical Cardioversion Electrical cardioversion is the delivery of a jolt of electricity to change the rhythm of the heart. Sticky patches or metal paddles are placed on the chest to deliver the electricity from a device. This is done to restore a normal rhythm. A rhythm that is too fast or not regular keeps the heart from pumping well. Electrical cardioversion is  done in an emergency if:   There is low or no blood pressure as a result of the heart rhythm.   Normal rhythm must be restored as fast as possible to protect the brain and heart from further damage.   It may save a life. Cardioversion may be done for heart rhythms that are not immediately life threatening, such as atrial fibrillation or flutter, in which:   The heart is beating too fast or is not regular.   Medicine to change the rhythm has not worked.   It is safe to wait in order to allow time for preparation.  Symptoms of the abnormal  rhythm are bothersome.  The risk of stroke and other serious problems can be reduced. LET [facility] CARE PROVIDER KNOW ABOUT:   Any allergies you have.  All medicines you are taking, including vitamins, herbs, eye drops, creams, and over-the-counter medicines.  Previous problems you or members of your family have had with the use of anesthetics.   Any blood disorders you have.   Previous surgeries you have had.   Medical conditions you have. RISKS AND COMPLICATIONS  Generally, this is a safe procedure. However, problems can occur and include:   Breathing problems related to the anesthetic used.  A blood clot that breaks free and travels to other parts of your body. This could cause a stroke or other problems. The risk of this is lowered by use of blood-thinning medicine (anticoagulant) prior to the procedure.  Cardiac arrest (rare). BEFORE THE PROCEDURE   You may have tests to detect blood clots in your heart and to evaluate heart function.  You may start taking anticoagulants so your blood does not clot as easily.   Medicines may be given to help stabilize your heart rate and rhythm. PROCEDURE  You will be given medicine through an IV tube to reduce discomfort and make you sleepy (sedative).   An electrical shock will be delivered. AFTER THE PROCEDURE Your heart rhythm will be watched to make sure it does not change.    This information is not intended to replace advice given to you by your health care provider. Make sure you discuss any questions you have with your health care provider.   Document Released: 04/03/2002 Document Revised: 05/04/2014 Document Reviewed: 10/26/2012 Elsevier Interactive Patient Education [employer].   If you need a refill on your cardiac medications before your next appointment, please call your pharmacy.       Mable Fill, PA-C  10/24/2015 11:53 AM    [facility] [address],  [place], [place]  [address] Phone: [phone]; Fax: [phone]

## 2015-10-24 ENCOUNTER — Ambulatory Visit (INDEPENDENT_AMBULATORY_CARE_PROVIDER_SITE_OTHER): Payer: Medicare HMO | Admitting: Physician Assistant

## 2015-10-24 ENCOUNTER — Encounter: Payer: Self-pay | Admitting: Physician Assistant

## 2015-10-24 ENCOUNTER — Encounter: Payer: Self-pay | Admitting: *Deleted

## 2015-10-24 VITALS — BP 138/90 | HR 127 | Ht 66.0 in | Wt 165.0 lb

## 2015-10-24 DIAGNOSIS — I2583 Coronary atherosclerosis due to lipid rich plaque: Secondary | ICD-10-CM

## 2015-10-24 DIAGNOSIS — I483 Typical atrial flutter: Secondary | ICD-10-CM

## 2015-10-24 DIAGNOSIS — I2510 Atherosclerotic heart disease of native coronary artery without angina pectoris: Secondary | ICD-10-CM | POA: Diagnosis not present

## 2015-10-24 DIAGNOSIS — E785 Hyperlipidemia, unspecified: Secondary | ICD-10-CM | POA: Diagnosis not present

## 2015-10-24 DIAGNOSIS — R002 Palpitations: Secondary | ICD-10-CM

## 2015-10-24 DIAGNOSIS — I4892 Unspecified atrial flutter: Secondary | ICD-10-CM

## 2015-10-24 DIAGNOSIS — I5031 Acute diastolic (congestive) heart failure: Secondary | ICD-10-CM

## 2015-10-24 LAB — CBC WITH DIFFERENTIAL/PLATELET
Basophils Absolute: 0 cells/uL (ref 0–200)
Basophils Relative: 0 %
Eosinophils Absolute: 85 cells/uL (ref 15–500)
Eosinophils Relative: 1 %
HCT: 34.7 % — ABNORMAL LOW (ref 38.5–50.0)
Hemoglobin: 11.5 g/dL — ABNORMAL LOW (ref 13.2–17.1)
Lymphocytes Relative: 10 %
Lymphs Abs: 850 cells/uL (ref 850–3900)
MCH: 30.1 pg (ref 27.0–33.0)
MCHC: 33.1 g/dL (ref 32.0–36.0)
MCV: 90.8 fL (ref 80.0–100.0)
MPV: 9.7 fL (ref 7.5–12.5)
Monocytes Absolute: 510 cells/uL (ref 200–950)
Monocytes Relative: 6 %
Neutro Abs: 7055 cells/uL (ref 1500–7800)
Neutrophils Relative %: 83 %
Platelets: 239 10*3/uL (ref 140–400)
RBC: 3.82 MIL/uL — ABNORMAL LOW (ref 4.20–5.80)
RDW: 14 % (ref 11.0–15.0)
WBC: 8.5 10*3/uL (ref 3.8–10.8)

## 2015-10-24 LAB — BASIC METABOLIC PANEL
BUN: 25 mg/dL (ref 7–25)
CO2: 25 mmol/L (ref 20–31)
Calcium: 9 mg/dL (ref 8.6–10.3)
Chloride: 98 mmol/L (ref 98–110)
Creat: 1.11 mg/dL (ref 0.70–1.25)
Glucose, Bld: 322 mg/dL — ABNORMAL HIGH (ref 65–99)
Potassium: 5.2 mmol/L (ref 3.5–5.3)
Sodium: 133 mmol/L — ABNORMAL LOW (ref 135–146)

## 2015-10-24 LAB — PROTIME-INR
INR: 1.1
Prothrombin Time: 11.4 s (ref 9.0–11.5)

## 2015-10-24 MED ORDER — METOPROLOL TARTRATE 50 MG PO TABS: 100.0000 mg | ORAL_TABLET | Freq: Every day | ORAL | Status: DC

## 2015-10-24 NOTE — Patient Instructions (Addendum)
Medication Instructions:  Your physician has recommended you make the following change in your medication:  1.  INCREASE the Metoprolol 50 mg taking 2 tablets daily  Labwork: TODAY:  BMET, CBC W/DIFF, & PT/INR  Testing/Procedures: Your physician has recommended that you have a Cardioversion (DCCV). Electrical Cardioversion uses a jolt of electricity to your heart either through paddles or wired patches attached to your chest. This is a controlled, usually prescheduled, procedure. Defibrillation is done under light anesthesia in the hospital, and you usually go home the day of the procedure. This is done to get your heart back into a normal rhythm. You are not awake for the procedure. Please see the instruction sheet given to you today.   Follow-Up: Your physician recommends that you schedule a follow-up appointment in: [place]   Any Other Special Instructions Will Be Listed Below (If Applicable). Electrical Cardioversion Electrical cardioversion is the delivery of a jolt of electricity to change the rhythm of the heart. Sticky patches or metal paddles are placed on the chest to deliver the electricity from a device. This is done to restore a normal rhythm. A rhythm that is too fast or not regular keeps the heart from pumping well. Electrical cardioversion is done in an emergency if:   There is low or no blood pressure as a result of the heart rhythm.   Normal rhythm must be restored as fast as possible to protect the brain and heart from further damage.   It may save a life. Cardioversion may be done for heart rhythms that are not immediately life threatening, such as atrial fibrillation or flutter, in which:   The heart is beating too fast or is not regular.   Medicine to change the rhythm has not worked.   It is safe to wait in order to allow time for preparation.  Symptoms of the abnormal rhythm are bothersome.  The risk of stroke and other  serious problems can be reduced. LET [facility] CARE PROVIDER KNOW ABOUT:   Any allergies you have.  All medicines you are taking, including vitamins, herbs, eye drops, creams, and over-the-counter medicines.  Previous problems you or members of your family have had with the use of anesthetics.   Any blood disorders you have.   Previous surgeries you have had.   Medical conditions you have. RISKS AND COMPLICATIONS  Generally, this is a safe procedure. However, problems can occur and include:   Breathing problems related to the anesthetic used.  A blood clot that breaks free and travels to other parts of your body. This could cause a stroke or other problems. The risk of this is lowered by use of blood-thinning medicine (anticoagulant) prior to the procedure.  Cardiac arrest (rare). BEFORE THE PROCEDURE   You may have tests to detect blood clots in your heart and to evaluate heart function.  You may start taking anticoagulants so your blood does not clot as easily.   Medicines may be given to help stabilize your heart rate and rhythm. PROCEDURE  You will be given medicine through an IV tube to reduce discomfort and make you sleepy (sedative).   An electrical shock will be delivered. AFTER THE PROCEDURE Your heart rhythm will be watched to make sure it does not change.    This information is not intended to replace advice given to you by your health care provider. Make sure you discuss any questions you have with your health care provider.   Document Released:  04/03/2002 Document Revised: 05/04/2014 Document Reviewed: 10/26/2012 Elsevier Interactive Patient Education [employer].   If you need a refill on your cardiac medications before your next appointment, please call your pharmacy.

## 2015-10-28 ENCOUNTER — Ambulatory Visit (HOSPITAL_COMMUNITY)
Admission: RE | Admit: 2015-10-28 | Discharge: 2015-10-28 | Disposition: A | Discharge status: Home / Self Care | Payer: Medicare HMO | Source: Ambulatory Visit | Attending: Cardiovascular Disease | Admitting: Cardiovascular Disease

## 2015-10-28 ENCOUNTER — Encounter (HOSPITAL_COMMUNITY): Payer: Self-pay | Admitting: *Deleted

## 2015-10-28 ENCOUNTER — Encounter (HOSPITAL_COMMUNITY)
Admission: RE | Disposition: A | Discharge status: Home / Self Care | Payer: Self-pay | Source: Ambulatory Visit | Attending: Cardiovascular Disease

## 2015-10-28 ENCOUNTER — Ambulatory Visit (HOSPITAL_COMMUNITY): Payer: Medicare HMO | Admitting: Certified Registered Nurse Anesthetist

## 2015-10-28 DIAGNOSIS — F4310 Post-traumatic stress disorder, unspecified: Secondary | ICD-10-CM | POA: Insufficient documentation

## 2015-10-28 DIAGNOSIS — I252 Old myocardial infarction: Secondary | ICD-10-CM | POA: Insufficient documentation

## 2015-10-28 DIAGNOSIS — Z9889 Other specified postprocedural states: Secondary | ICD-10-CM | POA: Diagnosis not present

## 2015-10-28 DIAGNOSIS — Z87891 Personal history of nicotine dependence: Secondary | ICD-10-CM | POA: Diagnosis not present

## 2015-10-28 DIAGNOSIS — Z7982 Long term (current) use of aspirin: Secondary | ICD-10-CM | POA: Diagnosis not present

## 2015-10-28 DIAGNOSIS — I4891 Unspecified atrial fibrillation: Secondary | ICD-10-CM | POA: Diagnosis not present

## 2015-10-28 DIAGNOSIS — Z923 Personal history of irradiation: Secondary | ICD-10-CM | POA: Diagnosis not present

## 2015-10-28 DIAGNOSIS — E11319 Type 2 diabetes mellitus with unspecified diabetic retinopathy without macular edema: Secondary | ICD-10-CM | POA: Insufficient documentation

## 2015-10-28 DIAGNOSIS — I2510 Atherosclerotic heart disease of native coronary artery without angina pectoris: Secondary | ICD-10-CM | POA: Insufficient documentation

## 2015-10-28 DIAGNOSIS — I4892 Unspecified atrial flutter: Secondary | ICD-10-CM | POA: Diagnosis not present

## 2015-10-28 DIAGNOSIS — I10 Essential (primary) hypertension: Secondary | ICD-10-CM | POA: Diagnosis not present

## 2015-10-28 DIAGNOSIS — I110 Hypertensive heart disease with heart failure: Secondary | ICD-10-CM | POA: Diagnosis not present

## 2015-10-28 DIAGNOSIS — I5032 Chronic diastolic (congestive) heart failure: Secondary | ICD-10-CM | POA: Diagnosis not present

## 2015-10-28 DIAGNOSIS — Z951 Presence of aortocoronary bypass graft: Secondary | ICD-10-CM | POA: Insufficient documentation

## 2015-10-28 DIAGNOSIS — E785 Hyperlipidemia, unspecified: Secondary | ICD-10-CM | POA: Diagnosis not present

## 2015-10-28 DIAGNOSIS — H409 Unspecified glaucoma: Secondary | ICD-10-CM | POA: Diagnosis not present

## 2015-10-28 DIAGNOSIS — I4430 Unspecified atrioventricular block: Secondary | ICD-10-CM | POA: Insufficient documentation

## 2015-10-28 DIAGNOSIS — Z7901 Long term (current) use of anticoagulants: Secondary | ICD-10-CM | POA: Insufficient documentation

## 2015-10-28 DIAGNOSIS — Z8547 Personal history of malignant neoplasm of testis: Secondary | ICD-10-CM | POA: Diagnosis not present

## 2015-10-28 DIAGNOSIS — Z794 Long term (current) use of insulin: Secondary | ICD-10-CM | POA: Insufficient documentation

## 2015-10-28 HISTORY — PX: CARDIOVERSION: SHX1299

## 2015-10-28 LAB — GLUCOSE, CAPILLARY: Glucose-Capillary: 253 mg/dL — ABNORMAL HIGH (ref 65–99)

## 2015-10-28 SURGERY — CARDIOVERSION
Anesthesia: Monitor Anesthesia Care

## 2015-10-28 MED ORDER — SODIUM CHLORIDE 0.9 % IV SOLN
INTRAVENOUS | Status: DC | PRN
  Administered 2015-10-28: 11:00:00 via INTRAVENOUS

## 2015-10-28 MED ORDER — PROPOFOL 10 MG/ML IV BOLUS
INTRAVENOUS | Status: DC | PRN
  Administered 2015-10-28: 70 mg via INTRAVENOUS

## 2015-10-28 MED ORDER — LIDOCAINE HCL (CARDIAC) 20 MG/ML IV SOLN
INTRAVENOUS | Status: DC | PRN
  Administered 2015-10-28: 40 mg via INTRAVENOUS

## 2015-10-28 NOTE — Interval H&P Note (Signed)
History and Physical Interval Note:  10/28/2015 10:48 AM  [name]  has presented today for surgery, with the diagnosis of A FLUTTER   The various methods of treatment have been discussed with the patient and family. After consideration of risks, benefits and other options for treatment, the patient has consented to  Procedure(s): CARDIOVERSION (N/A) as a surgical intervention .  The patient's history has been reviewed, patient examined, no change in status, stable for surgery.  I have reviewed the patient's chart and labs.  Questions were answered to the patient's satisfaction.     Matthew [name]

## 2015-10-28 NOTE — CV Procedure (Signed)
    Cardioversion Note  Solace L Almquist [id number] Feb 03, 1946  Procedure: DC Cardioversion Indications: atrial fib  Procedure Details Consent: Obtained Time Out: Verified patient identification, verified procedure, site/side was marked, verified correct patient position, special equipment/implants available, Radiology Safety Procedures followed,  medications/allergies/relevent history reviewed, required imaging and test results available.  Performed  The patient has been on adequate anticoagulation.  The patient received IV Lidocaine 20 mg iv followed by propofol 70 mg iv  for sedation.  Synchronous cardioversion was performed at 120  joules.  The cardioversion was successful     Complications: No apparent complications Patient did tolerate procedure well.   [name], Brooke Bonito., MD, [facility] 10/28/2015, 11:04 AM

## 2015-10-28 NOTE — Anesthesia Preprocedure Evaluation (Addendum)
Anesthesia Evaluation  Patient identified by MRN, date of birth, ID band Patient awake, Patient confused and Patient unresponsive    Reviewed: Allergy & Precautions, NPO status , reviewed documented beta blocker date and time   Airway Mallampati: II  TM Distance: <3 FB Neck ROM: Full    Dental   Pulmonary former smoker,    breath sounds clear to auscultation       Cardiovascular hypertension, Pt. on medications and Pt. on home beta blockers + CAD, + Past MI and + CABG  + dysrhythmias Atrial Fibrillation  Rhythm:Irregular Rate:Normal     Neuro/Psych  Headaches,    GI/Hepatic   Endo/Other  diabetesBS 253 this am-did not take insulin.   Renal/GU      Musculoskeletal   Abdominal   Peds  Hematology   Anesthesia Other Findings   Reproductive/Obstetrics                            Anesthesia Physical Anesthesia Plan  ASA: III  Anesthesia Plan: General   Post-op Pain Management:    Induction: Intravenous  Airway Management Planned: Mask  Additional Equipment:   Intra-op Plan:   Post-operative Plan:   Informed Consent:   Plan Discussed with: CRNA and Anesthesiologist  Anesthesia Plan Comments:         Anesthesia Quick Evaluation

## 2015-10-28 NOTE — Transfer of Care (Signed)
Immediate Anesthesia Transfer of Care Note  Patient: Matthew [name]  Procedure(s) Performed: Procedure(s): CARDIOVERSION (N/A)  Patient Location: Endo   Anesthesia Type:General  Level of Consciousness: awake, alert , oriented and patient cooperative  Airway & Oxygen Therapy: Patient connected to nasal cannula oxygen  Post-op Assessment: Post -op Vital signs reviewed and stable  Post vital signs: stable  Last Vitals:  Filed Vitals:   10/28/15 0945  BP: 140/84  Pulse: 97  Temp: 36.5 C  Resp: 19    Last Pain: There were no vitals filed for this visit.       Complications: No apparent anesthesia complications

## 2015-10-28 NOTE — Anesthesia Postprocedure Evaluation (Signed)
Anesthesia Post Note  Patient: Matthew [name]  Procedure(s) Performed: Procedure(s) (LRB): CARDIOVERSION (N/A)  Patient location during evaluation: Endoscopy Anesthesia Type: General Level of consciousness: awake, awake and alert and oriented Pain management: pain level controlled Vital Signs Assessment: post-procedure vital signs reviewed and stable Respiratory status: spontaneous breathing, nonlabored ventilation and respiratory function stable Cardiovascular status: blood pressure returned to baseline Anesthetic complications: no    Last Vitals:  Filed Vitals:   10/28/15 1120 10/28/15 1127  BP: 113/54 122/63  Pulse: 68 69  Temp:    Resp: 15 19    Last Pain: There were no vitals filed for this visit.               JOSLIN,DAVID COKER

## 2015-10-28 NOTE — Discharge Instructions (Signed)
Electrical Cardioversion, Care After °Refer to this sheet in the next few weeks. These instructions provide you with information on caring for yourself after your procedure. Your health care provider may also give you more specific instructions. Your treatment has been planned according to current medical practices, but problems sometimes occur. Call your health care provider if you have any problems or questions after your procedure. °WHAT TO EXPECT AFTER THE PROCEDURE °After your procedure, it is typical to have the following sensations: °· Some redness on the skin where the shocks were delivered. If this is tender, a sunburn lotion or hydrocortisone cream may help. °· Possible return of an abnormal heart rhythm within hours or days after the procedure. °HOME CARE INSTRUCTIONS °· Take medicines only as directed by your health care provider. Be sure you understand how and when to take your medicine. °· Learn how to feel your pulse and check it often. °· Limit your activity for 48 hours after the procedure or as directed by your health care provider. °· Avoid or minimize caffeine and other stimulants as directed by your health care provider. °SEEK MEDICAL CARE IF: °· You feel like your heart is beating too fast or your pulse is not regular. °· You have any questions about your medicines. °· You have bleeding that will not stop. °SEEK IMMEDIATE MEDICAL CARE IF: °· You are dizzy or feel faint. °· It is hard to breathe or you feel short of breath. °· There is a change in discomfort in your chest. °· Your speech is slurred or you have trouble moving an arm or leg on one side of your body. °· You get a serious muscle cramp that does not go away. °· Your fingers or toes turn cold or blue. °  °This information is not intended to replace advice given to you by your health care provider. Make sure you discuss any questions you have with your health care provider. °  °Document Released: 02/01/2013 Document Revised: 05/04/2014  Document Reviewed: 02/01/2013 °Elsevier Interactive Patient Education ©2016 Elsevier Inc. ° °

## 2015-10-28 NOTE — H&P (View-Only) (Signed)
Cardiology Office Note    Date:  10/24/2015   ID:  Matthew [name], DOB 07-13-1945, MRN [id number]  PCP:  Matthew Peers, MD  Cardiologist: Dr. Irish [name] / Dr. Lovena [name] (EP)  CC: follow up atrial flutter with RVR  History of Present Illness:  Matthew [name] is a 70 y.o. male with a history of CAD s/p CABG x3V (2011), DMT2, HLD, PAFlutter s/p ablation (10/2014) who presents to clinic for follow up of recurrent atrial flutter w/ RVR and A/C D CHF.   He was initially diagnosed with afib vs aflutter in 01/2014. He felt fatigued. Eliquis was started by his PCP prior to this visit. TSH normal. Metoprolol dose was increased. Myoview was obtained.This demonstrated inferolateral scar but no ischemia. Med Rx was recommended. Echo demonstrated normal LVF with mild AI/mod MR/mild MS. He underwent DCCV later that month.   He was seen by Matthew Bayley NP in 09/2014 for evaluation of recurrent atrial flutter and acute diastolic CHF. He was started on metoprolol and lasix. He was referred to EP for ablation. He underwent RFCA on 10/31/14 and came off his eliquis.   He was in his usual state of health until about 8-12 weeks ago when he started to notice abdominal distention as well as fatigue and weakness. He also had some intermittent [name] edema. He has also felt his heart skipping and hasn't been able to to find his pulse. He called in on Friday 10/04/15 and was advised to resume lasix, potassium and eliquis. Lab work was obtained which showed slightly high potassium and he was instructed to hold his potassium supplementation.  I saw him in the clinic on 10/08/15 for follow up. ECG showed aflutter with var AV block. HR 107. He was feeling a little better after lasix. I increased lopressor 50mg  BID to 75 mg BID and increase lasix to 40mg  daily with plan for DCCV after 3 weeks of uninterrupted eliquis. BMET that day showed an increase in creat to 1.36 and lasix decreased from 40mg  daily to 20mg  daily. Repeat BMET on  10/15/15 showed K 5.4 and creat 0.96. He was told to permanently discontinue his Kdur.   He saw Matthew [name] Pharm D on 10/15/15 in the lipid clinic due to hx of intolerance of statin drugs and CAD. He was started on low dose Crestor. He also complained of intolerance of metoprolol with fatigue, swelling and strange dreams. Matthew [name] wondered if it was really due to the medication or him being in atrial flutter. She sent a note to Dr. Lovena [name] for further input. He was added onto Matthew [name] schedule on 10/30/15.  Today he presents to clinic for follow up. He remains in aflutter with RVR. HR 127. He still feels poorly. He is not taking lasix everyday due to it being hard to do daily activities when he has to urinate all the time. He denies [name] edema, orthopnea or PND. No CP. No palpitations. He just feels fatigued and SOB. Just feels "terrible." He has not missed any doses of Eliquis.    Past Medical History  Diagnosis Date  . Hyperlipidemia     a. Intolerant of lipitor and vytorin.  . Glaucoma   . Diabetic retinopathy (HCC)     mild- Dr. Herbert [name]  . Coronary artery disease     a. 05/2009 CABG x 3: LIMA->LAD, VG->OM, VG->PDA; b. Nuc 01/2014: inf-lat scar but no ischemia, EF 57%.  . Valvular heart disease     a. 01/2014 Echo: Ef 60-65%, no  rwma, mild AI/MS, mod MR, mildly dil LA.  . Paroxysmal atrial flutter ([place])     a. 01/2014 s/p DCCV;  b. CHA2DS2VASc = 3-->Eliquis.  . Testicular cancer ([place]) 1982    "heavy doses of radiation; it was stage 2"  . CHF (congestive heart failure) ([place])   . Myocardial infarction [facility])     'saw evidence of it on an EKG done in 2005"  . Type II diabetes mellitus ([place])   . Pneumonia 1960  . Anemia   . Migraine     "once or twice" (10/31/2014)  . Sacral pain     "right"  . Sciatica of right side   . PTSD (post-traumatic stress disorder)     "X 2 yr post OHS" (10/31/2014)    Past Surgical History  Procedure Laterality Date  . Elbow fracture surgery Left     broken ulna  on left elbow-surgical repair  . Cardiac catheterization  2011  . Coronary artery bypass graft  2011    LIMA-LAD, SVG-OM, SVG-PDA 06/20/09  . Cardioversion N/A 02/19/2014    Procedure: CARDIOVERSION;  Surgeon: Matthew Grooms, MD;  Location: [facility] ENDOSCOPY;  Service: Cardiovascular;  Laterality: N/A;  . Abdominal exploration surgery  ~ 2005    "for hernia, but didn't have one"  . Fracture surgery    . Tonsillectomy  ~ 1967  . Testicle removal Right 1982  . Electrophysiologic study N/A 10/31/2014    Procedure: A-Flutter;  Surgeon: Matthew Lance, MD;  Location: [place] CV LAB;  Service: Cardiovascular;  Laterality: N/A;    Current Medications: Outpatient Prescriptions Prior to Visit  Medication Sig Dispense Refill  . apixaban (ELIQUIS) 5 MG TABS tablet Take 1 tablet (5 mg total) by mouth 2 (two) times daily. 180 tablet 3  . BD PEN NEEDLE NANO U/F 32G X 4 MM MISC Use 3-4 per day to inject insulin 200 each 1  . bimatoprost (LUMIGAN) 0.01 % SOLN Place 1 drop into the left eye at bedtime.    . Coenzyme Q10 (CO Q 10) 100 MG CAPS Take 1 capsule by mouth daily. 90 capsule 1  . furosemide (LASIX) 40 MG tablet Take 0.5 tablets (20 mg total) by mouth daily. 45 tablet 3  . glucose blood (ONETOUCH VERIO) test strip Use as instructed to check blood sugar 2 times per day dx code E11.59 200 each 1  . insulin lispro (HUMALOG KWIKPEN) 100 UNIT/ML KiwkPen Inject 0.12-0.15 mLs (12-15 Units total) into the skin 3 (three) times daily. 45 mL 1  . Insulin NPH, Human,, Isophane, (HUMULIN N) 100 UNIT/ML Kiwkpen Inject 26 Units into the skin daily. 15 mL 2  . lisinopril (PRINIVIL,ZESTRIL) 5 MG tablet TAKE 1 TABLET BY MOUTH EVERY DAY 30 tablet 0  . metoprolol (LOPRESSOR) 50 MG tablet Take 1.5 tablets (75 mg total) by mouth 2 (two) times daily. 270 tablet 1  . ONETOUCH DELICA LANCETS 99991111 MISC Use to check blood sugar 2 times per day dx code E11.59 200 each 1  . potassium chloride SA (KLOR-CON M20) 20 MEQ tablet  Take 1 tablet (20 mEq total) by mouth daily.    . TURMERIC PO Take 450 mg by mouth daily.     Marland Kitchen aspirin EC 81 MG tablet Take 1 tablet (81 mg total) by mouth daily. 90 tablet 3  . cholecalciferol (VITAMIN D) 1000 UNITS tablet Take 5,000 Units by mouth daily. Reported on 07/18/2015    . rosuvastatin (CRESTOR) 5 MG tablet TAKE 1 TABLET(5  MG) BY MOUTH DAILY OR AS DIRECTED 90 tablet 1   No facility-administered medications prior to visit.     Allergies:   Actos; Bee venom; Codeine; Statins; Glucophage; and Raspberry   Social History   Social History  . Marital Status: Married    Spouse Name: N/A  . Number of Children: N/A  . Years of Education: N/A   Social History Main Topics  . Smoking status: Former Smoker -- 0.00 packs/day for 0 years    Types: Cigarettes  . Smokeless tobacco: Never Used     Comment: "quit smoking cigarettes in the 1980's; don't know how much or for how long"  . Alcohol Use: 3.6 oz/week    6 Cans of beer per week  . Drug Use: No  . Sexual Activity: Yes    Birth Control/ Protection: Coitus interruptus   Other Topics Concern  . None   Social History Narrative     Family History:  The patient's family history includes Diabetes in his father, mother, and sister; Heart attack in his father; Heart disease in his father; Hypertension in his father.   ROS:   Please see the history of present illness.    ROS All other systems reviewed and are negative.   PHYSICAL EXAM:   VS:  BP 138/90 mmHg  Pulse 127  Ht 5\' 6"  (1.676 m)  Wt 165 lb (74.844 kg)  BMI 26.64 kg/m2  SpO2 97%   GEN: Well nourished, well developed, in no acute distress HEENT: normal Neck: no JVD, carotid bruits, or masses Cardiac: irreg irreg; tachy no murmurs, rubs, or gallops,no edema  Respiratory:  clear to auscultation bilaterally, normal work of breathing GI: soft, nontender, nondistended, + BS MS: no deformity or atrophy Skin: warm and dry, no rash Neuro:  Alert and Oriented x 3,  Strength and sensation are intact Psych: euthymic mood, full affect  Wt Readings from Last 3 Encounters:  10/24/15 165 lb (74.844 kg)  10/15/15 197 lb (89.359 kg)  10/08/15 193 lb (87.544 kg)      Studies/Labs Reviewed:   EKG:  EKG is ordered today.  The ekg ordered today demonstrates atrail flutter HR 127.  Recent Labs: 10/07/2015: Hemoglobin 12.6*; Magnesium 1.9; Platelets 253; TSH 2.97 10/10/2015: ALT 25 10/15/2015: BUN 21; Creat 0.96; Potassium 5.4*; Sodium 135   Lipid Panel    Component Value Date/Time   CHOL 221* 10/10/2015 0833   CHOL 216* 06/21/2013 1405   TRIG 114.0 10/10/2015 0833   TRIG 132 06/21/2013 1405   HDL 41.90 10/10/2015 0833   HDL 54 06/21/2013 1405   CHOLHDL 5 10/10/2015 0833   VLDL 22.8 10/10/2015 0833   LDLCALC 156* 10/10/2015 0833   LDLCALC 136* 06/21/2013 1405    Additional studies/ records that were reviewed today include:  2D ECHO 02/05/2014 LV EF: 60% -  65% Study Conclusion - Left ventricle: The cavity size was normal. There was mild focal basal hypertrophy of the septum. Systolic function was normal.The estimated ejection fraction was in the range of 60% to 65%. Wall motion was normal; there were no regional wall motion abnormalities. - Aortic valve: Valve mobility was restricted. There was mild regurgitation. Mean gradient (S): 7 mm Hg. Peak gradient (S): 12 mm Hg. Valve area (VTI): 2.03 cm^2. Valve area (Vmean): 2.02 cm^2. - Mitral valve: Calcified annulus. Mildly thickened leaflets . The findings are consistent with mild stenosis. There was moderate regurgitation. Valve area by continuity equation (using LVOT flow): 2.29 cm^2. - Left atrium: The  atrium was mildly dilated. - Right ventricle: Systolic function was mildly reduced. - Right atrium: The atrium was mildly dilated. Impressions: - Normal LV function; mild biatrial enlargement; thickened MV; moderate MR; mild MS by mean gradient (6 mmHg); calcified  aortic valve but no significant AS by doppler; mild AI.   01/2014 Myoview Overall Impression: Intermediate risk stress nuclear study with large-sized (Extent 18%), severe intensity fixed inferolateral defect consitent with LCx territory scar. LV Ejection Fraction: 57%. LV Wall Motion: inferolateral akinesis  ASSESSMENT & PLAN:    Paroxysmal atrial flutter: s/p previous ablation in 10/2014 by Dr. Lovena [name]. Found to have recurrent flutter that was clearly symptomatic. He was restarted on Eliquis on 10/04/15. Lopressor increased from 50mg  BID to 75 mg BID with plan for DCCV after 3 weeks of uninterrupted eliquis. Today he remains in aflutter with HR 127. BP 138/90. Will increase lopressor to 100mg  BID and set up for DCCV on 10/28/15. He will follow with Dr. Lovena [name] on 0000000  Acute diastolic CHF: Normal LV function by echo in October 2015. He seems to get volume overloaded when he goes into flutter. Has been having some abdominal distension and [name] edema which has improved on lasix 40mg  daily. This was decreased to 20mg  daily due to slight increase in creat 1.16--> 1.36. This improved on Lasix 20mg  daily. Appears euvolemic today. He has a very good idea of his dry weight and s/s CHF. He can take Lasix 40mg  PRN for s/s CHF or weight gain   Hyperkalemia: K was held. Will check BMET today.   HTN: Stable.   Type 2 diabetes mellitus: Followed by endocrinology.  HLD: His LDL of 147. He was recently was seen in the lipid clinc by Matthew [name] Pharm-D and disucussed low dose Crestor vs PCSK9i. He felt the PCSK9i would be too expensive and opted for low dose Crestor. HE has been tolerating this well so far.   CAD s/p CABG x3V (2011): will decrease ASA from 325mg  --> 81mg  daily. Continue BB. Intolerant to statins.   Medication Adjustments/Labs and Tests Ordered: Current medicines are reviewed at length with the patient today.  Concerns regarding medicines are outlined above.  Medication changes, Labs and  Tests ordered today are listed in the Patient Instructions below. Patient Instructions  Medication Instructions:  Your physician has recommended you make the following change in your medication:  1.  DECREASE the Lasix    Labwork: TODAY:  BMET, CBC W/DIFF, & PT/INR  Testing/Procedures: Your physician has recommended that you have a Cardioversion (DCCV). Electrical Cardioversion uses a jolt of electricity to your heart either through paddles or wired patches attached to your chest. This is a controlled, usually prescheduled, procedure. Defibrillation is done under light anesthesia in the hospital, and you usually go home the day of the procedure. This is done to get your heart back into a normal rhythm. You are not awake for the procedure. Please see the instruction sheet given to you today.   Follow-Up: Your physician recommends that you schedule a follow-up appointment in: [place]   Any Other Special Instructions Will Be Listed Below (If Applicable). Electrical Cardioversion Electrical cardioversion is the delivery of a jolt of electricity to change the rhythm of the heart. Sticky patches or metal paddles are placed on the chest to deliver the electricity from a device. This is done to restore a normal rhythm. A rhythm that is too fast or not regular keeps the heart from pumping well. Electrical cardioversion is  done in an emergency if:   There is low or no blood pressure as a result of the heart rhythm.   Normal rhythm must be restored as fast as possible to protect the brain and heart from further damage.   It may save a life. Cardioversion may be done for heart rhythms that are not immediately life threatening, such as atrial fibrillation or flutter, in which:   The heart is beating too fast or is not regular.   Medicine to change the rhythm has not worked.   It is safe to wait in order to allow time for preparation.  Symptoms of the abnormal  rhythm are bothersome.  The risk of stroke and other serious problems can be reduced. LET [facility] CARE PROVIDER KNOW ABOUT:   Any allergies you have.  All medicines you are taking, including vitamins, herbs, eye drops, creams, and over-the-counter medicines.  Previous problems you or members of your family have had with the use of anesthetics.   Any blood disorders you have.   Previous surgeries you have had.   Medical conditions you have. RISKS AND COMPLICATIONS  Generally, this is a safe procedure. However, problems can occur and include:   Breathing problems related to the anesthetic used.  A blood clot that breaks free and travels to other parts of your body. This could cause a stroke or other problems. The risk of this is lowered by use of blood-thinning medicine (anticoagulant) prior to the procedure.  Cardiac arrest (rare). BEFORE THE PROCEDURE   You may have tests to detect blood clots in your heart and to evaluate heart function.  You may start taking anticoagulants so your blood does not clot as easily.   Medicines may be given to help stabilize your heart rate and rhythm. PROCEDURE  You will be given medicine through an IV tube to reduce discomfort and make you sleepy (sedative).   An electrical shock will be delivered. AFTER THE PROCEDURE Your heart rhythm will be watched to make sure it does not change.    This information is not intended to replace advice given to you by your health care provider. Make sure you discuss any questions you have with your health care provider.   Document Released: 04/03/2002 Document Revised: 05/04/2014 Document Reviewed: 10/26/2012 Elsevier Interactive Patient Education [employer].   If you need a refill on your cardiac medications before your next appointment, please call your pharmacy.       Mable Fill, PA-C  10/24/2015 11:53 AM    [facility] [address],  [place], [place]  [address] Phone: [phone]; Fax: [phone]

## 2015-10-30 ENCOUNTER — Ambulatory Visit (INDEPENDENT_AMBULATORY_CARE_PROVIDER_SITE_OTHER): Payer: Medicare HMO | Admitting: Internal Medicine

## 2015-10-30 ENCOUNTER — Encounter (HOSPITAL_COMMUNITY): Payer: Self-pay | Admitting: Cardiovascular Disease

## 2015-10-30 VITALS — BP 138/72 | HR 73 | Ht 66.0 in | Wt 203.0 lb

## 2015-10-30 DIAGNOSIS — I483 Typical atrial flutter: Secondary | ICD-10-CM

## 2015-10-30 MED ORDER — METOPROLOL TARTRATE 50 MG PO TABS: 25.0000 mg | ORAL_TABLET | Freq: Two times a day (BID) | ORAL | Status: DC

## 2015-10-30 MED ORDER — FUROSEMIDE 40 MG PO TABS: 20.0000 mg | ORAL_TABLET | Freq: Every day | ORAL | Status: DC | PRN

## 2015-10-30 NOTE — Progress Notes (Signed)
HPI Matthew [name] returns today for followup. He is a pleasant 70 yo man with a h/o atrial flutter who underwent catheter ablation almost a year ago. Several weeks ago he developed recurrent symptoms. He was found to be in atrial flutter and has undergone DC CV. He feels better but still not back to normal. He has had fatigue on metoprolol 100 bid. No other complaints today. No chest pain.  Allergies  Allergen Reactions  . Actos [Pioglitazone] Swelling  . Bee Venom Swelling  . Codeine Nausea Only    REACTION: hives  . Statins     unknown  . Glucophage [Metformin Hcl] Nausea Only  . Raspberry Itching     Current Outpatient Prescriptions  Medication Sig Dispense Refill  . apixaban (ELIQUIS) 5 MG TABS tablet Take 1 tablet (5 mg total) by mouth 2 (two) times daily. 180 tablet 3  . BD PEN NEEDLE NANO U/F 32G X 4 MM MISC Use 3-4 per day to inject insulin 200 each 1  . bimatoprost (LUMIGAN) 0.01 % SOLN Place 1 drop into the left eye at bedtime.    . Coenzyme Q10 (CO Q 10) 100 MG CAPS Take 1 capsule by mouth daily. 90 capsule 1  . furosemide (LASIX) 40 MG tablet Take 0.5 tablets (20 mg total) by mouth daily. 45 tablet 3  . glucose blood (ONETOUCH VERIO) test strip Use as instructed to check blood sugar 2 times per day dx code E11.59 200 each 1  . insulin lispro (HUMALOG KWIKPEN) 100 UNIT/ML KiwkPen Inject 0.12-0.15 mLs (12-15 Units total) into the skin 3 (three) times daily. 45 mL 1  . Insulin NPH, Human,, Isophane, (HUMULIN N) 100 UNIT/ML Kiwkpen Inject 26 Units into the skin daily. 15 mL 2  . lisinopril (PRINIVIL,ZESTRIL) 5 MG tablet TAKE 1 TABLET BY MOUTH EVERY DAY 30 tablet 0  . metoprolol (LOPRESSOR) 50 MG tablet Take 2 tablets (100 mg total) by mouth daily. 180 tablet 1  . ONETOUCH DELICA LANCETS 99991111 MISC Use to check blood sugar 2 times per day dx code E11.59 200 each 1  . potassium chloride SA (KLOR-CON M20) 20 MEQ tablet Take 1 tablet (20 mEq total) by mouth daily.    .  rosuvastatin (CRESTOR) 5 MG tablet Take 5 mg by mouth daily.    . TURMERIC PO Take 450 mg by mouth daily.      No current facility-administered medications for this visit.     Past Medical History  Diagnosis Date  . Hyperlipidemia     a. Intolerant of lipitor and vytorin.  . Glaucoma   . Diabetic retinopathy (HCC)     mild- Dr. Herbert Deaner  . Coronary artery disease     a. 05/2009 CABG x 3: LIMA->LAD, VG->OM, VG->PDA; b. Nuc 01/2014: inf-lat scar but no ischemia, EF 57%.  . Valvular heart disease     a. 01/2014 Echo: Ef 60-65%, no rwma, mild AI/MS, mod MR, mildly dil LA.  . Paroxysmal atrial flutter ([place])     a. 01/2014 s/p DCCV;  b. CHA2DS2VASc = 3-->Eliquis.  . Testicular cancer ([place]) 1982    "heavy doses of radiation; it was stage 2"  . CHF (congestive heart failure) ([place])   . Myocardial infarction [facility])     'saw evidence of it on an EKG done in 2005"  . Type II diabetes mellitus ([place])   . Pneumonia 1960  . Anemia   . Migraine     "once or twice" (10/31/2014)  .  Sacral pain     "right"  . Sciatica of right side   . PTSD (post-traumatic stress disorder)     "X 2 yr post OHS" (10/31/2014)    ROS:   All systems reviewed and negative except as noted in the HPI.   Past Surgical History  Procedure Laterality Date  . Elbow fracture surgery Left     broken ulna on left elbow-surgical repair  . Cardiac catheterization  2011  . Coronary artery bypass graft  2011    LIMA-LAD, SVG-OM, SVG-PDA 06/20/09  . Cardioversion N/A 02/19/2014    Procedure: CARDIOVERSION;  Surgeon: [name], MD;  [facility] ENDOSCOPY;  Service: Cardiovascular;  Laterality: N/A;  . Abdominal exploration surgery  ~ 2005    "for hernia, but didn't have one"  . Fracture surgery    . Tonsillectomy  ~ 1967  . Testicle removal Right 1982  . Electrophysiologic study N/A 10/31/2014    Procedure: A-Flutter;  Surgeon: [name], MD;  Location: [place] CV LAB;  Service: Cardiovascular;  Laterality:  N/A;  . Cardioversion N/A 10/28/2015    Procedure: CARDIOVERSION;  Surgeon: [name], MD;  Location: [facility] ENDOSCOPY;  Service: Cardiovascular;  Laterality: N/A;     Family History  Problem Relation Age of Onset  . Heart disease Father   . Heart attack Father   . Diabetes Mother   . Diabetes Father   . Diabetes Sister   . Hypertension Father      Social History   Social History  . Marital Status: Married    Spouse Name: N/A  . Number of Children: N/A  . Years of Education: N/A   Occupational History  . Not on file.   Social History Main Topics  . Smoking status: Former Smoker -- 0.00 packs/day for 0 years    Types: Cigarettes  . Smokeless tobacco: Never Used     Comment: "quit smoking cigarettes in the 1980's; don't know how much or for how long"  . Alcohol Use: 3.6 oz/week    6 Cans of beer per week  . Drug Use: No  . Sexual Activity: Yes    Birth Control/ Protection: Coitus interruptus   Other Topics Concern  . Not on file   Social History Narrative     Pulse 73  Ht 5\' 6"  (1.676 m)  Wt 203 lb (92.08 kg)  BMI 32.78 kg/m2  Physical Exam:  Well appearing 69 yo woman, NAD HEENT: Unremarkable Neck:  7 cm JVD, no thyromegally Back:  No CVA tenderness Lungs:  Clear with no wheezes HEART:  Regular rate rhythm, no murmurs, no rubs, no clicks Abd:  soft, positive bowel sounds, no organomegally, no rebound, no guarding Ext:  2 plus pulses, no edema, no cyanosis, no clubbing Skin:  No rashes no nodules Neuro:  CN II through XII intact, motor grossly intact  EKG - NSR   Assess/Plan: 1. Atrial flutter - he has had a recurrence and undergone DCCV. We discussed the treatment options. I have offered another ablation but not pushed him. He is currently in rhythm. He will continue his Eliquis and I have asked him to get a blood pressure cuff. If he has more flutter I would recommend another ablation. As he feels bad on the metoprolol, will reduce his dose to 25 mg  bid. 2. Dyslipidemia - he will continue his statin 3. CAD - he has no angina.  4. Acute diastolic heart failure - we discussed his  lasix dosing and I have asked him to reduce his salt intake and to take the lasix as needed. He is encouraged to increase his physical activity.  Matthew Bosworth.D.

## 2015-10-30 NOTE — Patient Instructions (Addendum)
Medication Instructions:  Your physician has recommended you make the following change in your medication:  1) DECREASE Metoprolol to 25 mg twice a day 2) CHANGE Lasix to once daily as needed for swelling.  Labwork: None ordered  Testing/Procedures: None ordered  Follow-Up: Your physician recommends that you schedule a follow-up appointment in: 3 months with Dr. Lovena Le.   If you need a refill on your cardiac medications before your next appointment, please call your pharmacy.  Thank you for choosing [facility]!!     Any Other Special Instructions Will Be Listed Below (If Applicable).

## 2015-11-05 ENCOUNTER — Encounter: Payer: Medicare HMO | Admitting: Nutrition

## 2015-11-08 ENCOUNTER — Telehealth: Payer: Self-pay | Admitting: Internal Medicine

## 2015-11-08 DIAGNOSIS — I4892 Unspecified atrial flutter: Secondary | ICD-10-CM

## 2015-11-08 NOTE — Telephone Encounter (Addendum)
Returned call to patient and he did not felt a little off day before yesterday and some today.  Says when he checks his pulse it is irregular but not fast.  I let him know his EKG showed NSR w PAC's and this could be the irregularity he is feeling.  Says Dr Lovena Le talked to him about another ablation and he just wasn't sure if it needs to be scheduled.  He is going to keep a log over the weekend of his BP and HR and call me back Tues to so I can discuss with Dr Lovena Le

## 2015-11-08 NOTE — Telephone Encounter (Signed)
Patient c/o Palpitations:  High priority if patient c/o lightheadedness and shortness of breath.  1. How long have you been having palpitations? Couple days   2. Are you currently experiencing lightheadedness and shortness of breath? Some lightheaded  3. Have you checked your BP and heart rate? (document readings) no  4. Are you experiencing any other symptoms? no   Pt requested call back after 4pm. Please call back and discuss.

## 2015-11-11 NOTE — Telephone Encounter (Signed)
Follow up    Pt verbalized that he is calling to give BP readings to RN  Hr 105  118/70  99/66

## 2015-11-13 NOTE — Telephone Encounter (Signed)
Returned call to patient and he says he BP is great but his HR is staying around 100.  He increased his Metoprolol to 50 bid and feels his heart is still out of rhythm

## 2015-11-13 NOTE — Telephone Encounter (Signed)
Follow up   Pt verbalized that he wants to speak to Dr.Taylors RN  About previous message 11/11/15

## 2015-11-14 NOTE — Telephone Encounter (Signed)
Discussed with Dr Lovena Le and he wants the patient to come in for an EKG and if still in flutter will need to have another ablation.  I have left patient a message with this information and to call me to discuss

## 2015-11-15 ENCOUNTER — Other Ambulatory Visit (INDEPENDENT_AMBULATORY_CARE_PROVIDER_SITE_OTHER): Payer: Medicare HMO | Admitting: *Deleted

## 2015-11-15 DIAGNOSIS — I4892 Unspecified atrial flutter: Secondary | ICD-10-CM

## 2015-11-15 LAB — CBC WITH DIFFERENTIAL/PLATELET
Basophils Absolute: 0 cells/uL (ref 0–200)
Basophils Relative: 0 %
Eosinophils Absolute: 340 cells/uL (ref 15–500)
Eosinophils Relative: 5 %
HCT: 36.4 % — ABNORMAL LOW (ref 38.5–50.0)
Hemoglobin: 12.3 g/dL — ABNORMAL LOW (ref 13.2–17.1)
Lymphocytes Relative: 28 %
Lymphs Abs: 1904 cells/uL (ref 850–3900)
MCH: 29.6 pg (ref 27.0–33.0)
MCHC: 33.8 g/dL (ref 32.0–36.0)
MCV: 87.7 fL (ref 80.0–100.0)
MPV: 9.9 fL (ref 7.5–12.5)
Monocytes Absolute: 544 cells/uL (ref 200–950)
Monocytes Relative: 8 %
Neutro Abs: 4012 cells/uL (ref 1500–7800)
Neutrophils Relative %: 59 %
Platelets: 275 10*3/uL (ref 140–400)
RBC: 4.15 MIL/uL — ABNORMAL LOW (ref 4.20–5.80)
RDW: 14.2 % (ref 11.0–15.0)
WBC: 6.8 10*3/uL (ref 3.8–10.8)

## 2015-11-15 NOTE — Telephone Encounter (Signed)
Patient returned my call and he will come by today for an EKG

## 2015-11-15 NOTE — Telephone Encounter (Signed)
Patient here for an EKG, in flutter.  Discussed with Dr Lovena Le and will set up for flutter ablation for Wed at 12:30  [facility] [place] [facility] at 10:30am Nothing to eat or drink after midnight the night prior to the procedure Plan for one night stay in the hospital

## 2015-11-16 LAB — BASIC METABOLIC PANEL
BUN: 25 mg/dL (ref 7–25)
CO2: 27 mmol/L (ref 20–31)
Calcium: 8.9 mg/dL (ref 8.6–10.3)
Chloride: 101 mmol/L (ref 98–110)
Creat: 1.17 mg/dL (ref 0.70–1.25)
Glucose, Bld: 99 mg/dL (ref 65–99)
Potassium: 4.6 mmol/L (ref 3.5–5.3)
Sodium: 138 mmol/L (ref 135–146)

## 2015-11-20 ENCOUNTER — Encounter (HOSPITAL_COMMUNITY)
Admission: AD | Disposition: A | Discharge status: Home / Self Care | Payer: Self-pay | Source: Ambulatory Visit | Attending: Internal Medicine

## 2015-11-20 ENCOUNTER — Encounter (HOSPITAL_COMMUNITY): Payer: Self-pay

## 2015-11-20 ENCOUNTER — Inpatient Hospital Stay (HOSPITAL_COMMUNITY)
Admission: AD | Admit: 2015-11-20 | Discharge: 2015-11-22 | DRG: 274 | Disposition: A | Discharge status: Home / Self Care | Payer: Medicare HMO | Source: Ambulatory Visit | Attending: Internal Medicine | Admitting: Internal Medicine

## 2015-11-20 DIAGNOSIS — Z951 Presence of aortocoronary bypass graft: Secondary | ICD-10-CM | POA: Diagnosis not present

## 2015-11-20 DIAGNOSIS — Z79899 Other long term (current) drug therapy: Secondary | ICD-10-CM | POA: Diagnosis not present

## 2015-11-20 DIAGNOSIS — Z8547 Personal history of malignant neoplasm of testis: Secondary | ICD-10-CM | POA: Diagnosis not present

## 2015-11-20 DIAGNOSIS — Z888 Allergy status to other drugs, medicaments and biological substances status: Secondary | ICD-10-CM | POA: Diagnosis not present

## 2015-11-20 DIAGNOSIS — Z91030 Bee allergy status: Secondary | ICD-10-CM | POA: Diagnosis not present

## 2015-11-20 DIAGNOSIS — Z833 Family history of diabetes mellitus: Secondary | ICD-10-CM | POA: Diagnosis not present

## 2015-11-20 DIAGNOSIS — I2510 Atherosclerotic heart disease of native coronary artery without angina pectoris: Secondary | ICD-10-CM | POA: Diagnosis present

## 2015-11-20 DIAGNOSIS — Z8249 Family history of ischemic heart disease and other diseases of the circulatory system: Secondary | ICD-10-CM

## 2015-11-20 DIAGNOSIS — Z794 Long term (current) use of insulin: Secondary | ICD-10-CM

## 2015-11-20 DIAGNOSIS — I110 Hypertensive heart disease with heart failure: Secondary | ICD-10-CM | POA: Diagnosis present

## 2015-11-20 DIAGNOSIS — Z87891 Personal history of nicotine dependence: Secondary | ICD-10-CM | POA: Diagnosis not present

## 2015-11-20 DIAGNOSIS — Z7901 Long term (current) use of anticoagulants: Secondary | ICD-10-CM | POA: Diagnosis not present

## 2015-11-20 DIAGNOSIS — I484 Atypical atrial flutter: Principal | ICD-10-CM | POA: Diagnosis present

## 2015-11-20 DIAGNOSIS — E11319 Type 2 diabetes mellitus with unspecified diabetic retinopathy without macular edema: Secondary | ICD-10-CM | POA: Diagnosis not present

## 2015-11-20 DIAGNOSIS — I5032 Chronic diastolic (congestive) heart failure: Secondary | ICD-10-CM | POA: Diagnosis not present

## 2015-11-20 DIAGNOSIS — I4581 Long QT syndrome: Secondary | ICD-10-CM | POA: Diagnosis not present

## 2015-11-20 DIAGNOSIS — E785 Hyperlipidemia, unspecified: Secondary | ICD-10-CM | POA: Diagnosis present

## 2015-11-20 DIAGNOSIS — I4892 Unspecified atrial flutter: Secondary | ICD-10-CM | POA: Diagnosis present

## 2015-11-20 DIAGNOSIS — Z91018 Allergy to other foods: Secondary | ICD-10-CM

## 2015-11-20 DIAGNOSIS — I5033 Acute on chronic diastolic (congestive) heart failure: Secondary | ICD-10-CM | POA: Diagnosis not present

## 2015-11-20 DIAGNOSIS — H409 Unspecified glaucoma: Secondary | ICD-10-CM | POA: Diagnosis not present

## 2015-11-20 DIAGNOSIS — I252 Old myocardial infarction: Secondary | ICD-10-CM

## 2015-11-20 HISTORY — PX: CARDIOVERSION: SHX1299

## 2015-11-20 HISTORY — PX: ELECTROPHYSIOLOGIC STUDY: SHX172A

## 2015-11-20 HISTORY — DX: Low back pain: M54.5

## 2015-11-20 HISTORY — DX: Low back pain, unspecified: M54.50

## 2015-11-20 HISTORY — DX: Unspecified osteoarthritis, unspecified site: M19.90

## 2015-11-20 HISTORY — DX: Other chronic pain: G89.29

## 2015-11-20 LAB — BASIC METABOLIC PANEL
Anion gap: 8 (ref 5–15)
BUN: 18 mg/dL (ref 6–20)
CO2: 26 mmol/L (ref 22–32)
Calcium: 9 mg/dL (ref 8.9–10.3)
Chloride: 102 mmol/L (ref 101–111)
Creatinine, Ser: 0.85 mg/dL (ref 0.61–1.24)
GFR calc Af Amer: >60 mL/min (ref >60)
GFR calc non Af Amer: >60 mL/min (ref >60)
Glucose, Bld: 208 mg/dL — ABNORMAL HIGH (ref 65–99)
Potassium: 4.2 mmol/L (ref 3.5–5.1)
Sodium: 136 mmol/L (ref 135–145)

## 2015-11-20 LAB — MAGNESIUM: Magnesium: 2 mg/dL (ref 1.7–2.4)

## 2015-11-20 LAB — GLUCOSE, CAPILLARY
Glucose-Capillary: 308 mg/dL — ABNORMAL HIGH (ref 65–99)
Glucose-Capillary: 264 mg/dL — ABNORMAL HIGH (ref 65–99)
Glucose-Capillary: 201 mg/dL — ABNORMAL HIGH (ref 65–99)
Glucose-Capillary: 191 mg/dL — ABNORMAL HIGH (ref 65–99)
Glucose-Capillary: 183 mg/dL — ABNORMAL HIGH (ref 65–99)

## 2015-11-20 SURGERY — A-FLUTTER/A-TACH/SVT ABLATION
Anesthesia: LOCAL

## 2015-11-20 MED ORDER — FENTANYL CITRATE (PF) 100 MCG/2ML IJ SOLN
INTRAMUSCULAR | Status: AC
  Filled 2015-11-20: qty 2

## 2015-11-20 MED ORDER — INSULIN LISPRO 100 UNIT/ML (KWIKPEN): 12.0000 [IU] | PEN_INJECTOR | Freq: Three times a day (TID) | SUBCUTANEOUS | Status: DC

## 2015-11-20 MED ORDER — SODIUM CHLORIDE 0.9 % IV SOLN
INTRAVENOUS | Status: DC
  Administered 2015-11-20: 11:00:00 via INTRAVENOUS

## 2015-11-20 MED ORDER — SODIUM CHLORIDE 0.9% FLUSH
3.0000 mL | Freq: Two times a day (BID) | INTRAVENOUS | Status: DC
  Administered 2015-11-21 (×2): 3 mL via INTRAVENOUS

## 2015-11-20 MED ORDER — BUPIVACAINE HCL (PF) 0.25 % IJ SOLN
INTRAMUSCULAR | Status: AC
  Filled 2015-11-20: qty 30

## 2015-11-20 MED ORDER — HEPARIN (PORCINE) IN NACL 2-0.9 UNIT/ML-% IJ SOLN
INTRAMUSCULAR | Status: DC | PRN
  Administered 2015-11-20: 15:00:00

## 2015-11-20 MED ORDER — INSULIN ASPART 100 UNIT/ML ~~LOC~~ SOLN
2.0000 [IU] | SUBCUTANEOUS | Status: AC
  Administered 2015-11-20: 2 [IU] via SUBCUTANEOUS

## 2015-11-20 MED ORDER — ROSUVASTATIN CALCIUM 10 MG PO TABS
5.0000 mg | ORAL_TABLET | Freq: Every day | ORAL | Status: DC
  Administered 2015-11-20 – 2015-11-21 (×2): 5 mg via ORAL
  Filled 2015-11-20 (×2): qty 1

## 2015-11-20 MED ORDER — SODIUM CHLORIDE 0.9 % IV SOLN: 250.0000 mL | INTRAVENOUS | Status: DC | PRN

## 2015-11-20 MED ORDER — SODIUM CHLORIDE 0.9% FLUSH
3.0000 mL | Freq: Two times a day (BID) | INTRAVENOUS | Status: DC
  Administered 2015-11-21: 3 mL via INTRAVENOUS

## 2015-11-20 MED ORDER — LISINOPRIL 5 MG PO TABS
5.0000 mg | ORAL_TABLET | Freq: Every day | ORAL | Status: DC
  Administered 2015-11-20 – 2015-11-22 (×3): 5 mg via ORAL
  Filled 2015-11-20 (×3): qty 1

## 2015-11-20 MED ORDER — METOPROLOL TARTRATE 50 MG PO TABS
50.0000 mg | ORAL_TABLET | Freq: Two times a day (BID) | ORAL | Status: DC
  Administered 2015-11-20 – 2015-11-22 (×4): 50 mg via ORAL
  Filled 2015-11-20: qty 1
  Filled 2015-11-20: qty 2
  Filled 2015-11-20: qty 1
  Filled 2015-11-20: qty 2

## 2015-11-20 MED ORDER — HEPARIN SODIUM (PORCINE) 1000 UNIT/ML IJ SOLN
INTRAMUSCULAR | Status: AC
  Filled 2015-11-20: qty 1

## 2015-11-20 MED ORDER — POTASSIUM CHLORIDE CRYS ER 20 MEQ PO TBCR: 20.0000 meq | EXTENDED_RELEASE_TABLET | Freq: Every day | ORAL | Status: DC | PRN

## 2015-11-20 MED ORDER — ACETAMINOPHEN 325 MG PO TABS: 650.0000 mg | ORAL_TABLET | ORAL | Status: DC | PRN

## 2015-11-20 MED ORDER — DOFETILIDE 500 MCG PO CAPS: 500.0000 ug | ORAL_CAPSULE | Freq: Two times a day (BID) | ORAL | Status: DC

## 2015-11-20 MED ORDER — INSULIN ASPART 100 UNIT/ML ~~LOC~~ SOLN
SUBCUTANEOUS | Status: AC
  Administered 2015-11-20: 2 [IU] via SUBCUTANEOUS
  Filled 2015-11-20: qty 1

## 2015-11-20 MED ORDER — MIDAZOLAM HCL 5 MG/5ML IJ SOLN
INTRAMUSCULAR | Status: AC
  Filled 2015-11-20: qty 5

## 2015-11-20 MED ORDER — FENTANYL CITRATE (PF) 100 MCG/2ML IJ SOLN
INTRAMUSCULAR | Status: DC | PRN
  Administered 2015-11-20 (×2): 12.5 ug via INTRAVENOUS
  Administered 2015-11-20: 25 ug via INTRAVENOUS
  Administered 2015-11-20: 12.5 ug via INTRAVENOUS
  Administered 2015-11-20: 25 ug via INTRAVENOUS
  Administered 2015-11-20: 12.5 ug via INTRAVENOUS

## 2015-11-20 MED ORDER — BUPIVACAINE HCL (PF) 0.25 % IJ SOLN
INTRAMUSCULAR | Status: DC | PRN
  Administered 2015-11-20: 35 mL

## 2015-11-20 MED ORDER — ONDANSETRON HCL 4 MG/2ML IJ SOLN: 4.0000 mg | Freq: Four times a day (QID) | INTRAMUSCULAR | Status: DC | PRN

## 2015-11-20 MED ORDER — HEPARIN (PORCINE) IN NACL 2-0.9 UNIT/ML-% IJ SOLN
INTRAMUSCULAR | Status: AC
  Filled 2015-11-20: qty 500

## 2015-11-20 MED ORDER — OFF THE BEAT BOOK
Freq: Once | Status: AC
  Administered 2015-11-20: 21:00:00
  Filled 2015-11-20: qty 1

## 2015-11-20 MED ORDER — INSULIN NPH (HUMAN) (ISOPHANE) 100 UNIT/ML ~~LOC~~ SUSP
26.0000 [IU] | Freq: Every day | SUBCUTANEOUS | Status: DC
  Administered 2015-11-20: 18 [IU] via SUBCUTANEOUS
  Administered 2015-11-21: 26 [IU] via SUBCUTANEOUS
  Filled 2015-11-20 (×2): qty 10

## 2015-11-20 MED ORDER — INSULIN ASPART 100 UNIT/ML ~~LOC~~ SOLN
12.0000 [IU] | Freq: Three times a day (TID) | SUBCUTANEOUS | Status: DC
Start: 2015-11-20 — End: 2015-11-22
  Administered 2015-11-20: 18:00:00 15 [IU] via SUBCUTANEOUS
  Administered 2015-11-21: 12 [IU] via SUBCUTANEOUS
  Administered 2015-11-21: 15 [IU] via SUBCUTANEOUS
  Administered 2015-11-21: 14 [IU] via SUBCUTANEOUS

## 2015-11-20 MED ORDER — MIDAZOLAM HCL 5 MG/5ML IJ SOLN
INTRAMUSCULAR | Status: DC | PRN
  Administered 2015-11-20 (×3): 1 mg via INTRAVENOUS
  Administered 2015-11-20: 2 mg via INTRAVENOUS
  Administered 2015-11-20 (×3): 1 mg via INTRAVENOUS

## 2015-11-20 MED ORDER — FUROSEMIDE 40 MG PO TABS: 40.0000 mg | ORAL_TABLET | Freq: Every day | ORAL | Status: DC | PRN

## 2015-11-20 MED ORDER — SODIUM CHLORIDE 0.9% FLUSH
3.0000 mL | INTRAVENOUS | Status: DC | PRN
Start: 2015-11-20 — End: 2015-11-22

## 2015-11-20 MED ORDER — SODIUM CHLORIDE 0.9% FLUSH: 3.0000 mL | INTRAVENOUS | Status: DC | PRN

## 2015-11-20 MED ORDER — APIXABAN 5 MG PO TABS
5.0000 mg | ORAL_TABLET | Freq: Two times a day (BID) | ORAL | Status: DC
  Administered 2015-11-20 – 2015-11-22 (×4): 5 mg via ORAL
  Filled 2015-11-20 (×4): qty 1

## 2015-11-20 SURGICAL SUPPLY — 12 items
BAG SNAP BAND KOVER 36X36 (MISCELLANEOUS) ×2
CATH JOSEPH QUAD ALLRED 6F REP (CATHETERS) ×2
CATH POLARIS X 2.5/5/2.5 DECAP (CATHETERS) ×2
PACK EP LATEX FREE (CUSTOM PROCEDURE TRAY) ×2
PACK EP LF (CUSTOM PROCEDURE TRAY) ×1
PAD DEFIB LIFELINK (PAD) ×2
PATCH CARTO3 (PAD) ×2
SHEATH PINNACLE 6F 10CM (SHEATH) ×2
SHEATH PINNACLE 7F 10CM (SHEATH) ×2
SHEATH PINNACLE 8F 10CM (SHEATH) ×2
SHIELD RADPAD SCOOP 12X17 (MISCELLANEOUS) ×2
TUBING SMART ABLATE COOLFLOW (TUBING) ×2

## 2015-11-20 NOTE — Progress Notes (Signed)
QTc 526 Mg 1.9 K 4.6 SCr 85 On apixaban Hold tikosyn for elevated QTc with no ICD in place

## 2015-11-20 NOTE — Progress Notes (Signed)
6Fr, 7Fr, and 8Fr right femoral venous sheaths all aspirated and removed.  Pressure held for 69mins by Eddie Dibbles, RN.  Hemostasis obtained, site level 0.  Gauze and tegaderm dressing applied to site.  DP pulse palpable.  Instructions given to patient.  Patient verbalizes understanding.    Bedrest begins 4:30pm.

## 2015-11-20 NOTE — Interval H&P Note (Signed)
History and Physical Interval Note:  11/20/2015 2:46 PM  Matthew [name]  has presented today for surgery, with the diagnosis of flutter  The various methods of treatment have been discussed with the patient and family. After consideration of risks, benefits and other options for treatment, the patient has consented to  Procedure(s): A-Flutter Ablation (N/A) as a surgical intervention .  The patient's history has been reviewed, patient examined, no change in status, stable for surgery.  I have reviewed the patient's chart and labs.  Questions were answered to the patient's satisfaction.     [name]

## 2015-11-20 NOTE — H&P (View-Only) (Signed)
HPI [name] returns today for followup. He is a pleasant 70 yo man with a h/o atrial flutter who underwent catheter ablation almost a year ago. Several weeks ago he developed recurrent symptoms. He was found to be in atrial flutter and has undergone DC CV. He feels better but still not back to normal. He has had fatigue on metoprolol 100 bid. No other complaints today. No chest pain.  Allergies  Allergen Reactions  . Actos [Pioglitazone] Swelling  . Bee Venom Swelling  . Codeine Nausea Only    REACTION: hives  . Statins     unknown  . Glucophage [Metformin Hcl] Nausea Only  . Raspberry Itching     Current Outpatient Prescriptions  Medication Sig Dispense Refill  . apixaban (ELIQUIS) 5 MG TABS tablet Take 1 tablet (5 mg total) by mouth 2 (two) times daily. 180 tablet 3  . BD PEN NEEDLE NANO U/F 32G X 4 MM MISC Use 3-4 per day to inject insulin 200 each 1  . bimatoprost (LUMIGAN) 0.01 % SOLN Place 1 drop into the left eye at bedtime.    . Coenzyme Q10 (CO Q 10) 100 MG CAPS Take 1 capsule by mouth daily. 90 capsule 1  . furosemide (LASIX) 40 MG tablet Take 0.5 tablets (20 mg total) by mouth daily. 45 tablet 3  . glucose blood (ONETOUCH VERIO) test strip Use as instructed to check blood sugar 2 times per day dx code E11.59 200 each 1  . insulin lispro (HUMALOG KWIKPEN) 100 UNIT/ML KiwkPen Inject 0.12-0.15 mLs (12-15 Units total) into the skin 3 (three) times daily. 45 mL 1  . Insulin NPH, Human,, Isophane, (HUMULIN N) 100 UNIT/ML Kiwkpen Inject 26 Units into the skin daily. 15 mL 2  . lisinopril (PRINIVIL,ZESTRIL) 5 MG tablet TAKE 1 TABLET BY MOUTH EVERY DAY 30 tablet 0  . metoprolol (LOPRESSOR) 50 MG tablet Take 2 tablets (100 mg total) by mouth daily. 180 tablet 1  . ONETOUCH DELICA LANCETS 99991111 MISC Use to check blood sugar 2 times per day dx code E11.59 200 each 1  . potassium chloride SA (KLOR-CON M20) 20 MEQ tablet Take 1 tablet (20 mEq total) by mouth daily.    .  rosuvastatin (CRESTOR) 5 MG tablet Take 5 mg by mouth daily.    . TURMERIC PO Take 450 mg by mouth daily.      No current facility-administered medications for this visit.     Past Medical History  Diagnosis Date  . Hyperlipidemia     a. Intolerant of lipitor and vytorin.  . Glaucoma   . Diabetic retinopathy (HCC)     mild- Dr. Herbert [name]  . Coronary artery disease     a. 05/2009 CABG x 3: LIMA->LAD, VG->OM, VG->PDA; b. Nuc 01/2014: inf-lat scar but no ischemia, EF 57%.  . Valvular heart disease     a. 01/2014 Echo: Ef 60-65%, no rwma, mild AI/MS, mod MR, mildly dil LA.  . Paroxysmal atrial flutter ([place])     a. 01/2014 s/p DCCV;  b. CHA2DS2VASc = 3-->Eliquis.  . Testicular cancer ([place]) 1982    "heavy doses of radiation; it was stage 2"  . CHF (congestive heart failure) ([place])   . Myocardial infarction [facility])     'saw evidence of it on an EKG done in 2005"  . Type II diabetes mellitus ([place])   . Pneumonia 1960  . Anemia   . Migraine     "once or twice" (10/31/2014)  .  Sacral pain     "right"  . Sciatica of right side   . PTSD (post-traumatic stress disorder)     "X 2 yr post OHS" (10/31/2014)    ROS:   All systems reviewed and negative except as noted in the HPI.   Past Surgical History  Procedure Laterality Date  . Elbow fracture surgery Left     broken ulna on left elbow-surgical repair  . Cardiac catheterization  2011  . Coronary artery bypass graft  2011    LIMA-LAD, SVG-OM, SVG-PDA 06/20/09  . Cardioversion N/A 02/19/2014    Procedure: CARDIOVERSION;  Surgeon: Matthew Grooms, MD;  Location: [facility] ENDOSCOPY;  Service: Cardiovascular;  Laterality: N/A;  . Abdominal exploration surgery  ~ 2005    "for hernia, but didn't have one"  . Fracture surgery    . Tonsillectomy  ~ 1967  . Testicle removal Right 1982  . Electrophysiologic study N/A 10/31/2014    Procedure: A-Flutter;  Surgeon: Matthew Lance, MD;  Location: [facility] CV LAB;  Service: Cardiovascular;  Laterality:  N/A;  . Cardioversion N/A 10/28/2015    Procedure: CARDIOVERSION;  Surgeon: Matthew Headings, MD;  Location: [facility] ENDOSCOPY;  Service: Cardiovascular;  Laterality: N/A;     Family History  Problem Relation Age of Onset  . Heart disease Father   . Heart attack Father   . Diabetes Mother   . Diabetes Father   . Diabetes Sister   . Hypertension Father      Social History   Social History  . Marital Status: Married    Spouse Name: N/A  . Number of Children: N/A  . Years of Education: N/A   Occupational History  . Not on file.   Social History Main Topics  . Smoking status: Former Smoker -- 0.00 packs/day for 0 years    Types: Cigarettes  . Smokeless tobacco: Never Used     Comment: "quit smoking cigarettes in the 1980's; don't know how much or for how long"  . Alcohol Use: 3.6 oz/week    6 Cans of beer per week  . Drug Use: No  . Sexual Activity: Yes    Birth Control/ Protection: Coitus interruptus   Other Topics Concern  . Not on file   Social History Narrative     Pulse 73  Ht 5\' 6"  (1.676 m)  Wt 203 lb (92.08 kg)  BMI 32.78 kg/m2  Physical Exam:  Well appearing 70 yo woman, NAD HEENT: Unremarkable Neck:  7 cm JVD, no thyromegally Back:  No CVA tenderness Lungs:  Clear with no wheezes HEART:  Regular rate rhythm, no murmurs, no rubs, no clicks Abd:  soft, positive bowel sounds, no organomegally, no rebound, no guarding Ext:  2 plus pulses, no edema, no cyanosis, no clubbing Skin:  No rashes no nodules Neuro:  CN II through XII intact, motor grossly intact  EKG - NSR   Assess/Plan: 1. Atrial flutter - he has had a recurrence and undergone DCCV. We discussed the treatment options. I have offered another ablation but not pushed him. He is currently in rhythm. He will continue his Eliquis and I have asked him to get a blood pressure cuff. If he has more flutter I would recommend another ablation. As he feels bad on the metoprolol, will reduce his dose to 25 mg  bid. 2. Dyslipidemia - he will continue his statin 3. CAD - he has no angina.  4. Acute diastolic heart failure - we discussed his  lasix dosing and I have asked him to reduce his salt intake and to take the lasix as needed. He is encouraged to increase his physical activity.  Mikle Bosworth.D.

## 2015-11-20 NOTE — Interval H&P Note (Signed)
History and Physical Interval Note:  11/20/2015 11:30 AM  Matthew [name]  has presented today for surgery, with the diagnosis of flutter  The various methods of treatment have been discussed with the patient and family. After consideration of risks, benefits and other options for treatment, the patient has consented to  Procedure(s): A-Flutter Ablation (N/A) as a surgical intervention .  The patient's history has been reviewed, patient examined, no change in status, stable for surgery.  I have reviewed the patient's chart and labs.  Questions were answered to the patient's satisfaction.     [name]

## 2015-11-21 ENCOUNTER — Encounter (HOSPITAL_COMMUNITY): Payer: Self-pay | Admitting: Internal Medicine

## 2015-11-21 DIAGNOSIS — I484 Atypical atrial flutter: Principal | ICD-10-CM

## 2015-11-21 LAB — BASIC METABOLIC PANEL
Anion gap: 7 (ref 5–15)
BUN: 17 mg/dL (ref 6–20)
CO2: 29 mmol/L (ref 22–32)
Calcium: 9.1 mg/dL (ref 8.9–10.3)
Chloride: 97 mmol/L — ABNORMAL LOW (ref 101–111)
Creatinine, Ser: 1.07 mg/dL (ref 0.61–1.24)
GFR calc Af Amer: >60 mL/min (ref >60)
GFR calc non Af Amer: >60 mL/min (ref >60)
Glucose, Bld: 282 mg/dL — ABNORMAL HIGH (ref 65–99)
Potassium: 4.7 mmol/L (ref 3.5–5.1)
Sodium: 133 mmol/L — ABNORMAL LOW (ref 135–145)

## 2015-11-21 LAB — GLUCOSE, CAPILLARY
Glucose-Capillary: 253 mg/dL — ABNORMAL HIGH (ref 65–99)
Glucose-Capillary: 132 mg/dL — ABNORMAL HIGH (ref 65–99)
Glucose-Capillary: 143 mg/dL — ABNORMAL HIGH (ref 65–99)
Glucose-Capillary: 126 mg/dL — ABNORMAL HIGH (ref 65–99)

## 2015-11-21 LAB — MAGNESIUM: Magnesium: 2.1 mg/dL (ref 1.7–2.4)

## 2015-11-21 MED ORDER — DOFETILIDE 500 MCG PO CAPS: 500.0000 ug | ORAL_CAPSULE | Freq: Two times a day (BID) | ORAL | Status: DC

## 2015-11-21 MED ORDER — DOFETILIDE 250 MCG PO CAPS
250.0000 ug | ORAL_CAPSULE | Freq: Two times a day (BID) | ORAL | Status: DC
  Administered 2015-11-21: 250 ug via ORAL
  Filled 2015-11-21 (×2): qty 1

## 2015-11-21 MED ORDER — DOFETILIDE 500 MCG PO CAPS
500.0000 ug | ORAL_CAPSULE | Freq: Two times a day (BID) | ORAL | Status: DC
  Administered 2015-11-21: 500 ug via ORAL
  Filled 2015-11-21: qty 1

## 2015-11-21 MED FILL — Heparin Sodium (Porcine) Inj 1000 Unit/ML: INTRAMUSCULAR | Qty: 10 | Status: AC

## 2015-11-21 NOTE — Progress Notes (Signed)
Patient arrived to [facility] from [facility] telemetry placed and CCMD called vital signs obtained will continue to monitor patient. Cloer, Bettina Gavia  RN

## 2015-11-21 NOTE — Care Management Note (Signed)
Case Management Note  Patient Details  Name: Matthew [name] MRN: [id number] Date of Birth: [birth year]  Subjective/Objective:  Patient with aflutter/ablation , Tikosyn initiated, awaiting benefit check, Patient will be ready for dc on Saturday, needs a 3 day load.  NCM will cont to follow for dc needs.  Patient lives with wife at home and pta indep.                    Action/Plan:   Expected Discharge Date:                  Expected Discharge Plan:  Home/Self Care  In-House Referral:     Discharge planning Services  CM Consult  Post Acute Care Choice:    Choice offered to:     DME Arranged:    DME Agency:     HH Arranged:    HH Agency:     Status of Service:  In process, will continue to follow  If discussed at Long Length of Stay Meetings, dates discussed:    Additional Comments:  Zenon Mayo, RN 11/21/2015, 7:47 AM

## 2015-11-21 NOTE — Progress Notes (Signed)
Dr Rayann Heman reviewed EKG -QTc prolonged at 520msec. Will decrease Tikosyn to 237mcg   Amber Seiler, NP 11/21/2015 4:51 PM

## 2015-11-21 NOTE — Progress Notes (Signed)
    SUBJECTIVE: The patient is doing well today.  At this time, he denies chest pain, shortness of breath, or any new concerns.  EPS yesterday demonstrated left atrial flutter and he was admitted for Tikosyn loading.   CURRENT MEDICATIONS: . apixaban  5 mg Oral BID  . dofetilide  500 mcg Oral BID  . insulin aspart  12-15 Units Subcutaneous TID WC  . insulin NPH Human  26 Units Subcutaneous QHS  . lisinopril  5 mg Oral Daily  . metoprolol  50 mg Oral BID  . rosuvastatin  5 mg Oral q1800  . sodium chloride flush  3 mL Intravenous Q12H  . sodium chloride flush  3 mL Intravenous Q12H   . sodium chloride 50 mL/hr at 11/20/15 1059    OBJECTIVE: Physical Exam: Vitals:   11/20/15 1800 11/20/15 1922 11/20/15 2000 11/21/15 0412  BP: 130/61 140/69  (!) 105/54  Pulse: 81 91  84  Resp: 20 16 15 17   Temp:  97.6 F (36.4 C)  98 F (36.7 C)  TempSrc:  Oral  Oral  SpO2: 96% 98%  97%  Weight:    193 lb 9 oz (87.8 kg)  Height:       No intake or output data in the 24 hours ending 11/21/15 0733  Telemetry reveals sinus rhythm  GEN- The patient is well appearing, alert and oriented x 3 today.   Head- normocephalic, atraumatic Eyes-  Sclera clear, conjunctiva pink Ears- hearing intact Oropharynx- clear Neck- supple Lungs- Clear to ausculation bilaterally, normal work of breathing Heart- Regular rate and rhythm GI- soft, NT, ND, + BS Extremities- no clubbing, cyanosis, or edema Skin- no rash or lesion Psych- euthymic mood, full affect Neuro- strength and sensation are intact  LABS: Basic Metabolic Panel:  Recent Labs  11/20/15 1712 11/21/15 0405  NA 136 133*  K 4.2 4.7  CL 102 97*  CO2 26 29  GLUCOSE 208* 282*  BUN 18 17  CREATININE 0.85 1.07  CALCIUM 9.0 9.1  MG 2.0 2.1    ASSESSMENT AND PLAN:  Active Problems:   Atrial flutter (HCC)    1.  Atypical atrial flutter Plan for Tikosyn loading QTc acceptable when accounting for RBBB BMET, Mg ok this morning Will  start Tikosyn this morning and follow QTc closely Continue Eliquis for CHADS2VASC of 4  2.  HTN Stable No change required today  3.  CAD No recent ischemic symptoms   Chanetta Marshall, NP 11/21/2015 7:36 AM   EP Attending  Patient seen and examined. Agree with above. His exam is as documented above. The patient was found to have left atrial flutter on EP testing. His QT interval is prolonged but by my calculation around 480 in setting of a QRS duration of 150. I would like to proceed with initiation of Dofetilide. Will follow ECG and watch electrolytes. Will DC after 5 doses if stable. Will also discussed amiodarone. If QT prolongs too much, then amio would be used.  Mikle Bosworth.D.

## 2015-11-21 NOTE — Progress Notes (Signed)
Inpatient Diabetes Program Recommendations  AACE/ADA: New Consensus Statement on Inpatient Glycemic Control (2015)  Target Ranges:  Prepandial:   less than 140 mg/dL      Peak postprandial:   less than 180 mg/dL (1-2 hours)      Critically ill patients:  140 - 180 mg/dL   Lab Results  Component Value Date   GLUCAP 132 (H) 11/21/2015   HGBA1C 9.0 (H) 10/07/2015    Review of Glycemic Control  Results for Matthew [name], Matthew [name] (MRN [id number]) as of 11/21/2015 12:03  Ref. Range 11/20/2015 16:10 11/20/2015 16:44 11/20/2015 21:42 11/21/2015 06:53 11/21/2015 11:18  Glucose-Capillary Latest Ref Range: 65 - 99 mg/dL 201 (H) 191 (H) 183 (H) 253 (H) 132 (H)    Diabetes history: Type 2 Outpatient Diabetes medications: NPH 26 units qhs, Humalog 12-15 units tid Current orders for Inpatient glycemic control: NPH 26 units qhs, Novolog 12-15 units tid  Inpatient Diabetes Program Recommendations:  Agree with current orders for blood sugar management.  Gentry Fitz, RN, BA, MHA, CDE Diabetes Coordinator Inpatient Diabetes Program  [phone] (Team Pager) [phone] (Trainer) 11/21/2015 12:05 PM

## 2015-11-22 ENCOUNTER — Other Ambulatory Visit: Payer: Self-pay | Admitting: Nurse Practitioner

## 2015-11-22 DIAGNOSIS — I484 Atypical atrial flutter: Secondary | ICD-10-CM

## 2015-11-22 DIAGNOSIS — I4892 Unspecified atrial flutter: Secondary | ICD-10-CM

## 2015-11-22 LAB — GLUCOSE, CAPILLARY
Glucose-Capillary: 52 mg/dL — ABNORMAL LOW (ref 65–99)
Glucose-Capillary: 66 mg/dL (ref 65–99)
Glucose-Capillary: 77 mg/dL (ref 65–99)
Glucose-Capillary: 303 mg/dL — ABNORMAL HIGH (ref 65–99)

## 2015-11-22 LAB — BASIC METABOLIC PANEL
Anion gap: 6 (ref 5–15)
BUN: 17 mg/dL (ref 6–20)
CO2: 30 mmol/L (ref 22–32)
Calcium: 9.2 mg/dL (ref 8.9–10.3)
Chloride: 102 mmol/L (ref 101–111)
Creatinine, Ser: 1.06 mg/dL (ref 0.61–1.24)
GFR calc Af Amer: >60 mL/min (ref >60)
GFR calc non Af Amer: >60 mL/min (ref >60)
Glucose, Bld: 118 mg/dL — ABNORMAL HIGH (ref 65–99)
Potassium: 4.6 mmol/L (ref 3.5–5.1)
Sodium: 138 mmol/L (ref 135–145)

## 2015-11-22 LAB — MAGNESIUM: Magnesium: 2.3 mg/dL (ref 1.7–2.4)

## 2015-11-22 MED ORDER — AMIODARONE HCL 200 MG PO TABS: 200.0000 mg | ORAL_TABLET | Freq: Two times a day (BID) | ORAL | 1 refills | Status: DC

## 2015-11-22 NOTE — Care Management Note (Signed)
Case Management Note Previous CM note initiated by Tomi Bamberger RN, CM  Patient Details  Name: Matthew [name] MRN: [id number] Date of Birth: 1945/09/01  Subjective/Objective:  Patient with aflutter/ablation , Tikosyn initiated, awaiting benefit check, Patient will be ready for dc on Saturday, needs a 3 day load.  NCM will cont to follow for dc needs.  Patient lives with wife at home and pta indep.                    Action/Plan: 11/22/15- Marvetta Gibbons RN, CM- Pt tx from [facility] to 2W on 7/27- for Tikosyn load- benefits check completed- S/W SHANNON @ [employer] [facility] # [phone]   TIKOSYN 250 MCG BID  AND 500 MCG BID ** NOT COVER **   DOFETILIDE 250 MCG BID    AND     500 MCG   COVER- YES                       YES  CO-PAY- $ 194.61                    $183.68  PRIOR APPROVAL - NO               NO  PHARMACY : [facility]   If pt goes home on Tikosyn will need 7 day supply from [employer] on discharge- along with script to take to his pharmacy to order in stock- CM to follow for further Tikosyn needs.   Expected Discharge Date:     11/22/15             Expected Discharge Plan:  Home/Self Care  In-House Referral:     Discharge planning Services  CM Consult, Medication Assistance  Post Acute Care Choice:    Choice offered to:     DME Arranged:    DME Agency:     HH Arranged:    HH Agency:     Status of Service:  Completed, signed off  If discussed at [employer] of Stay Meetings, dates discussed:    Additional Comments:  11/22/15- 1150- Kristi Webster RN, CM- pt did not tolerate Tikosyn due to prolonged QTc- Tikosyn has been discontinued and pt will not d/c home on it. No further CM needs noted.   Dahlia Client Hamberg, RN 11/22/2015, 11:49 AM [phone]

## 2015-11-22 NOTE — Progress Notes (Addendum)
Insurance check for [employer] @ [facility] # [phone]   TIKOSYN 250 MCG BID  AND 500 MCG BID ** NOT COVER **   DOFETILIDE 250 MCG BID    AND     500 MCG   COVER- YES                       YES  CO-PAY- $ 194.61                    $183.68  PRIOR APPROVAL - NO               NO  PHARMACY : [facility]

## 2015-11-22 NOTE — Care Management Important Message (Signed)
Important Message  Patient Details  Name: Matthew [name] MRN: [id number] Date of Birth: [birth year]   Medicare Important Message Given:  Yes    Loann Quill 11/22/2015, 10:32 AM

## 2015-11-22 NOTE — Discharge Summary (Signed)
ELECTROPHYSIOLOGY PROCEDURE DISCHARGE SUMMARY    Patient ID: Matthew [name],  MRN: [id number], DOB/AGE: 10-03-1945 70 y.o.  Admit date: 11/20/2015 Discharge date: 11/22/2015  Primary Care Physician: Elyn Peers, MD Primary Cardiologist: Irish Lack Electrophysiologist: Lovena Le  Primary Discharge Diagnosis:  Active Problems:   Atrial flutter ([place])  Secondary Diagnosis: 1.  CAD s/p CABG 2.  Diabetes 3.  Hyperlipidemia 4.  Chronic diastolic heart failure   Allergies  Allergen Reactions  . Actos [Pioglitazone] Swelling  . Bee Venom Swelling  . Codeine Nausea Only    REACTION: hives  . Statins     unknown  . Glucophage [Metformin Hcl] Nausea Only  . Raspberry Itching    Procedures This Admission:  1.  Electrophysiology study on 11/20/15 by Dr Lovena Le.  This study demonstrated left atrial flutter.  Ablation wasn't done, the patient was successfully cardioverted. There were no early apparent complications.   Brief HPI:  Matthew [name] is a 70 y.o. male with a past medical history as outlined above. He has undergone previous flutter ablation and has had recurrent atrial flutter.  He was seen by Dr Lovena Le and plans were made for repeat ablation. Risks, benefits reviewed with the patient who wished to proceed.   Hospital Course:  The patient underwent EPS with left atrial flutter identified. He underwent cardioversion and was placed on Tikosyn. His QTc prolonged and Tikosyn dose was reduced. His QTc remained prolonged even on decreased dose of Tikosyn. He was seen by Dr Lovena Le and recommendation is to start amiodarone 200mg  twice daily tomorrow with EKG in 1 week and follow up with Dr Lovena Le in 4 weeks. He will need TSH and LFT's at time of EKG in 1 week.  He was seen by Dr Lovena Le and considered to be stable for discharge to home.   Physical Exam: Vitals:   11/21/15 1559 11/21/15 1600 11/21/15 1805 11/21/15 2116  BP: 139/61 139/61 (!) 144/70 131/69  Pulse: 71 71 73 70    Resp: 16 15 18 18   Temp: 97.7 F (36.5 C)  98.1 F (36.7 C) 98.1 F (36.7 C)  TempSrc: Oral  Oral Oral  SpO2: 98% 99% 96% 98%  Weight:      Height:        Labs:   Lab Results  Component Value Date   WBC 6.8 11/15/2015   HGB 12.3 (L) 11/15/2015   HCT 36.4 (L) 11/15/2015   MCV 87.7 11/15/2015   PLT 275 11/15/2015     Recent Labs Lab 11/22/15 0349  NA 138  K 4.6  CL 102  CO2 30  BUN 17  CREATININE 1.06  CALCIUM 9.2  GLUCOSE 118*     Discharge Medications:  Current Discharge Medication List    START taking these medications   Details  amiodarone (PACERONE) 200 MG tablet Take 1 tablet (200 mg total) by mouth 2 (two) times daily. Qty: 60 tablet, Refills: 1      CONTINUE these medications which have NOT CHANGED   Details  apixaban (ELIQUIS) 5 MG TABS tablet Take 1 tablet (5 mg total) by mouth 2 (two) times daily. Qty: 180 tablet, Refills: 3    BD PEN NEEDLE NANO U/F 32G X 4 MM MISC Use 3-4 per day to inject insulin Qty: 200 each, Refills: 1    bimatoprost (LUMIGAN) 0.01 % SOLN Place 1 drop into the left eye at bedtime.    Coenzyme Q10 (CO Q 10) 100 MG CAPS Take 1 capsule by mouth  daily. Qty: 90 capsule, Refills: 1    furosemide (LASIX) 40 MG tablet Take 0.5 tablets (20 mg total) by mouth daily as needed. for swelling Qty: 30 tablet, Refills: 3    glucose blood (ONETOUCH VERIO) test strip Use as instructed to check blood sugar 2 times per day dx code E11.59 Qty: 200 each, Refills: 1    insulin lispro (HUMALOG KWIKPEN) 100 UNIT/ML KiwkPen Inject 0.12-0.15 mLs (12-15 Units total) into the skin 3 (three) times daily. Qty: 45 mL, Refills: 1    Insulin NPH, Human,, Isophane, (HUMULIN N) 100 UNIT/ML Kiwkpen Inject 26 Units into the skin daily. Qty: 15 mL, Refills: 2    lisinopril (PRINIVIL,ZESTRIL) 5 MG tablet TAKE 1 TABLET BY MOUTH EVERY DAY Qty: 30 tablet, Refills: 0    metoprolol (LOPRESSOR) 50 MG tablet Take 0.5 tablets (25 mg total) by mouth 2 (two)  times daily. Qty: 90 tablet, Refills: 3   Associated Diagnoses: Typical atrial flutter (HCC)    ONETOUCH DELICA LANCETS 99991111 MISC Use to check blood sugar 2 times per day dx code E11.59 Qty: 200 each, Refills: 1    potassium chloride SA (KLOR-CON M20) 20 MEQ tablet Take 20 mEq by mouth daily as needed (with Lasix).     rosuvastatin (CRESTOR) 5 MG tablet Take 5 mg by mouth daily.    TURMERIC PO Take 450 mg by mouth daily.         Disposition: Pt is being discharged home today in good condition. Discharge Instructions    Diet - low sodium heart healthy    Complete by:  As directed   Increase activity slowly    Complete by:  As directed     Follow-up Information    [place] Office Follow up on 11/28/2015.   Specialty:  Cardiology Why:  at 10:30AM for EKG and labs Contact information: [address] [place]       [name], MD Follow up on 12/24/2015.   Specialty:  Cardiology Why:  at 10:15AM Contact information: [address] [address] [phone]           Duration of Discharge Encounter: Greater than 30 minutes including physician time.  Signed, Chanetta Marshall, NP 11/22/2015 11:26 AM  EP Attending  Patient seen and examined. Agree with above. His QT prolongation is marked and makes dofetilide contra-indicated. I have discussed the treatment options with the patient and have recommended he start taking amiodarone 200 bid for a month. I will see him back at that point and have him reduce his dose to 200 mg daily. He will come back in a week for an ECG.  Mikle Bosworth.D.

## 2015-11-22 NOTE — Progress Notes (Signed)
Repeat EKG done and QTc 543 and Dr. Susy Manor paged and RTC and inform given; wanted to know about giving Tikosyn, and he stated to give Tikosyn.

## 2015-11-25 ENCOUNTER — Other Ambulatory Visit: Payer: Medicare HMO

## 2015-11-28 ENCOUNTER — Other Ambulatory Visit: Payer: Medicare HMO

## 2015-11-28 ENCOUNTER — Ambulatory Visit: Payer: Medicare HMO | Admitting: Endocrinology

## 2015-11-28 ENCOUNTER — Ambulatory Visit: Payer: Medicare HMO | Admitting: Pharmacist

## 2015-12-10 ENCOUNTER — Other Ambulatory Visit (INDEPENDENT_AMBULATORY_CARE_PROVIDER_SITE_OTHER): Payer: Medicare HMO

## 2015-12-10 DIAGNOSIS — E1165 Type 2 diabetes mellitus with hyperglycemia: Secondary | ICD-10-CM | POA: Diagnosis not present

## 2015-12-10 DIAGNOSIS — Z794 Long term (current) use of insulin: Secondary | ICD-10-CM | POA: Diagnosis not present

## 2015-12-10 LAB — COMPREHENSIVE METABOLIC PANEL
ALT: 15 U/L (ref 0–53)
AST: 17 U/L (ref 0–37)
Albumin: 4 g/dL (ref 3.5–5.2)
Alkaline Phosphatase: 72 U/L (ref 39–117)
BUN: 32 mg/dL — ABNORMAL HIGH (ref 6–23)
CO2: 29 mEq/L (ref 19–32)
Calcium: 9.4 mg/dL (ref 8.4–10.5)
Chloride: 99 mEq/L (ref 96–112)
Creatinine, Ser: 1.5 mg/dL (ref 0.40–1.50)
GFR: 49.52 mL/min — ABNORMAL LOW (ref >60.00)
Glucose, Bld: 384 mg/dL — ABNORMAL HIGH (ref 70–99)
Potassium: 5 mEq/L (ref 3.5–5.1)
Sodium: 134 mEq/L — ABNORMAL LOW (ref 135–145)
Total Bilirubin: 0.7 mg/dL (ref 0.2–1.2)
Total Protein: 6.7 g/dL (ref 6.0–8.3)

## 2015-12-10 LAB — LIPID PANEL
Cholesterol: 154 mg/dL (ref 0–200)
HDL: 48.6 mg/dL (ref >39.00)
LDL Cholesterol: 89 mg/dL (ref 0–99)
NonHDL: 105.11
Total CHOL/HDL Ratio: 3
Triglycerides: 80 mg/dL (ref 0.0–149.0)
VLDL: 16 mg/dL (ref 0.0–40.0)

## 2015-12-12 ENCOUNTER — Ambulatory Visit (INDEPENDENT_AMBULATORY_CARE_PROVIDER_SITE_OTHER): Payer: Medicare HMO | Admitting: Endocrinology

## 2015-12-12 ENCOUNTER — Encounter: Payer: Self-pay | Admitting: Endocrinology

## 2015-12-12 VITALS — BP 132/80 | HR 58 | Ht 66.0 in | Wt 196.0 lb

## 2015-12-12 DIAGNOSIS — E1165 Type 2 diabetes mellitus with hyperglycemia: Secondary | ICD-10-CM | POA: Diagnosis not present

## 2015-12-12 DIAGNOSIS — Z794 Long term (current) use of insulin: Secondary | ICD-10-CM | POA: Diagnosis not present

## 2015-12-12 LAB — FRUCTOSAMINE: Fructosamine: 381 umol/L — ABNORMAL HIGH (ref 190–270)

## 2015-12-12 NOTE — Patient Instructions (Addendum)
Check sugars 3-4x times daily  Check blood sugars on waking up  3x weekly  Also check blood sugars about 2 hours after a meal and do this after different meals by rotation  Recommended blood sugar levels on waking up is 90-130 and about 2 hours after meal is 130-160  Please bring your blood sugar monitor to each visit, thank you  Take 20 units daily at bedtime and adjust only based on am sugar pattern  Bedtime protein snack

## 2015-12-12 NOTE — Progress Notes (Signed)
Patient ID: Matthew [name], male   DOB: [birth year], 70 y.o.   MRN: [id number]           Reason for Appointment: Followup for Type 2 Diabetes  Referring physician: Criss Rosales  History of Present Illness:          Diagnosis: Type 2 diabetes mellitus, date of diagnosis:  1992      Past history: His blood sugar was high at diagnosis when he was having a routine screening done. He was started on Glucophage initially which he took for about a year. He thinks it did not help his sugar much and he did not feel good with it Apparently he was trying to control his diabetes with diet and exercise for a few years. He may have tried Glucophage again before going on insulin. Also was started on Actos which caused swelling He has been treated mostly with premixed insulin for about 15 years and his level of control appears to be inadequate although details of previous treatment are not available. He may have taken Humalog at one time for use with high sugars A1c was 11.5 in 2011 On his initial consultation in 9/15 because of poor control with premixed insulin he was switched to Levemir twice a day and Humalog with meals.  Recent history:   INSULIN regimen is described as: 18-23 N hs; Humalog 12-16 units 3 times a day with meals   His A1c has been above: The last one was 9% Fructosamine is significantly high at 384    Current management, blood sugar patterns and problems:  Checking blood sugar very sporadically and has a few readings in the mornings and later in the day will check when he feels hypoglycemic or does not feel good  Despite reminders he does not check his blood sugars after meals especially after supper  He was told to keep a diary of blood sugars, pre-and postprandial as well as insulin and meal intake to discuss with nurse educator and he did not do so  BEDTIME insulin: He was told to adjust this only based on fasting readings but he is again adjusting this based on what he is eating in  the evening.  He thinks he takes mostly 20-22 units  However he still has an occasional low blood sugar at 3-4 AM  He does not know why he gets low sugars in the afternoons, currently not exercising  He takes somewhat arbitrary doses of insulin at mealtimes and gives variable answers about how much he is taking.  He was told to use carbohydrate counting to adjust his insulin but he is not doing so  With hypoglycemia he has symptoms of weakness, sweating, shakiness and confusion Treating low sugars with orange juice       Oral hypoglycemic drugs the patient is taking are: None      Side effects from medications have been: Edema from Actos,? Nausea and malaise from metformin Compliance with the medical regimen: Fair  Glucose monitoring:  done ?  one time a day         Glucometer: One Touch.      Blood Glucose readings by  download:   Mean values apply above for all meters except median for One Touch  PRE-MEAL Fasting Lunch Dinner Bedtime Overall  Glucose range: 119-296  44-87  50-109     Mean/median: 210     98    Self-care:   Meals: 3 meals per day.  Bfst 8:30, 1 pm Dinner is  about 8 PM.  Some meals are high fat      Exercise: Previously playing Racquetball about 3-4 days a week mostly in morning before breakfast , sometimes late afternoon           Dietician visit, most recent:?  15 years ago.     CDE visit: 3/16            Weight history:  Wt Readings from Last 3 Encounters:  12/12/15 196 lb (88.9 kg)  11/21/15 193 lb 9 oz (87.8 kg)  10/30/15 203 lb (92.1 kg)    Glycemic control:   Lab Results  Component Value Date   HGBA1C 9.0 (H) 10/07/2015   HGBA1C 8.6 07/18/2015   HGBA1C 8.8 12/21/2014   Lab Results  Component Value Date   MICROALBUR 3.4 (H) 07/18/2015   LDLCALC 89 12/10/2015   CREATININE 1.50 12/10/2015    Lab on 12/10/2015  Component Date Value Ref Range Status  . Fructosamine 12/12/2015 381* 190 - 270 umol/L Final  . Sodium 12/10/2015 134* 135 - 145  mEq/L Final  . Potassium 12/10/2015 5.0  3.5 - 5.1 mEq/L Final  . Chloride 12/10/2015 99  96 - 112 mEq/L Final  . CO2 12/10/2015 29  19 - 32 mEq/L Final  . Glucose, Bld 12/10/2015 384* 70 - 99 mg/dL Final  . BUN 12/10/2015 32* 6 - 23 mg/dL Final  . Creatinine, Ser 12/10/2015 1.50  0.40 - 1.50 mg/dL Final  . Total Bilirubin 12/10/2015 0.7  0.2 - 1.2 mg/dL Final  . Alkaline Phosphatase 12/10/2015 72  39 - 117 U/L Final  . AST 12/10/2015 17  0 - 37 U/L Final  . ALT 12/10/2015 15  0 - 53 U/L Final  . Total Protein 12/10/2015 6.7  6.0 - 8.3 g/dL Final  . Albumin 12/10/2015 4.0  3.5 - 5.2 g/dL Final  . Calcium 12/10/2015 9.4  8.4 - 10.5 mg/dL Final  . GFR 12/10/2015 49.52* >60.00 mL/min Final  . Cholesterol 12/10/2015 154  0 - 200 mg/dL Final  . Triglycerides 12/10/2015 80.0  0.0 - 149.0 mg/dL Final  . HDL 12/10/2015 48.60  >39.00 mg/dL Final  . VLDL 12/10/2015 16.0  0.0 - 40.0 mg/dL Final  . LDL Cholesterol 12/10/2015 89  0 - 99 mg/dL Final  . Total CHOL/HDL Ratio 12/10/2015 3   Final  . NonHDL 12/10/2015 105.11   Final        Medication List       Accurate as of 12/12/15  4:33 PM. Always use your most recent med list.          amiodarone 200 MG tablet Commonly known as:  PACERONE TAKE 1 TABLET(200 MG) BY MOUTH TWICE DAILY   apixaban 5 MG Tabs tablet Commonly known as:  ELIQUIS Take 1 tablet (5 mg total) by mouth 2 (two) times daily.   BD PEN NEEDLE NANO U/F 32G X 4 MM Misc Generic drug:  Insulin Pen Needle Use 3-4 per day to inject insulin   bimatoprost 0.01 % Soln Commonly known as:  LUMIGAN Place 1 drop into the left eye at bedtime.   Co Q 10 100 MG Caps Take 1 capsule by mouth daily.   furosemide 40 MG tablet Commonly known as:  LASIX Take 0.5 tablets (20 mg total) by mouth daily as needed. for swelling   glucose blood test strip Commonly known as:  ONETOUCH VERIO Use as instructed to check blood sugar 2 times per day dx code E11.59  insulin lispro 100  UNIT/ML KiwkPen Commonly known as:  HUMALOG KWIKPEN Inject 0.12-0.15 mLs (12-15 Units total) into the skin 3 (three) times daily.   Insulin NPH (Human) (Isophane) 100 UNIT/ML Kiwkpen Commonly known as:  HUMULIN N Inject 26 Units into the skin daily.   KLOR-CON M20 20 MEQ tablet Generic drug:  potassium chloride SA Take 20 mEq by mouth daily as needed (with Lasix).   lisinopril 5 MG tablet Commonly known as:  PRINIVIL,ZESTRIL TAKE 1 TABLET BY MOUTH EVERY DAY   metoprolol 50 MG tablet Commonly known as:  LOPRESSOR Take 0.5 tablets (25 mg total) by mouth 2 (two) times daily.   ONETOUCH DELICA LANCETS 99991111 Misc Use to check blood sugar 2 times per day dx code E11.59   rosuvastatin 5 MG tablet Commonly known as:  CRESTOR Take 5 mg by mouth daily.   TURMERIC PO Take 450 mg by mouth daily.       Allergies:  Allergies  Allergen Reactions  . Actos [Pioglitazone] Swelling  . Bee Venom Swelling  . Codeine Nausea Only    REACTION: hives  . Statins     unknown  . Glucophage [Metformin Hcl] Nausea Only  . Raspberry Itching    Past Medical History:  Diagnosis Date  . Anemia   . Arthritis    "back, left ankle" (11/20/2015)  . CHF (congestive heart failure) ([place])   . Chronic lower back pain   . Coronary artery disease    a. 05/2009 CABG x 3: LIMA->LAD, VG->OM, VG->PDA; b. Nuc 01/2014: inf-lat scar but no ischemia, EF 57%.  . Diabetic retinopathy (HCC)    mild- Dr. Herbert Deaner  . Glaucoma   . Hyperlipidemia    a. Intolerant of lipitor and vytorin.  . Migraine    "once or twice" (11/20/2015)  . Myocardial infarction [facility])    'saw evidence of it on an EKG done in 2005"  . Paroxysmal atrial flutter ([place])    a. 01/2014 s/p DCCV;  b. CHA2DS2VASc = 3-->Eliquis.  . Pneumonia 1960  . PTSD (post-traumatic stress disorder)    "X 2 yr post OHS" (10/31/2014)  . Sacral pain    "right"  . Sciatica of right side   . Testicular cancer ([place]) 1982   "heavy doses of radiation; it was stage  2"  . Type II diabetes mellitus ([place])   . Valvular heart disease    a. 01/2014 Echo: Ef 60-65%, no rwma, mild AI/MS, mod MR, mildly dil LA.    Past Surgical History:  Procedure Laterality Date  . ABDOMINAL EXPLORATION SURGERY  ~ 2005   "for hernia, but didn't have one"  . CARDIAC CATHETERIZATION  2011  . CARDIOVERSION N/A 02/19/2014   Procedure: CARDIOVERSION;  Surgeon: [name], MD;  Location[facility] ENDOSCOPY;  Service: Cardiovascular;  Laterality: N/A;  . CARDIOVERSION N/A 10/28/2015   Procedure: CARDIOVERSION;  Surgeon: [name], MD;  Location: [facility] ENDOSCOPY;  Service: Cardiovascular;  Laterality: N/A;  . CARDIOVERSION  11/20/2015   "200 joules"  . CORONARY ARTERY BYPASS GRAFT  05/2009   LIMA-LAD, SVG-OM, SVG-PDA 06/20/09  . ELBOW FRACTURE SURGERY Left    broken ulna on left elbow-surgical repair  . ELECTROPHYSIOLOGIC STUDY N/A 10/31/2014   Procedure: A-Flutter;  Surgeon: [name], MD;  Location: [place] CV LAB;  Service: Cardiovascular;  Laterality: N/A;  . ELECTROPHYSIOLOGIC STUDY N/A 11/20/2015   Procedure: A-Flutter Ablation;  Surgeon: [name], MD;  Location: [place] CV LAB;  Service:  Cardiovascular;  Laterality: N/A;  . FRACTURE SURGERY    . TESTICLE REMOVAL Right 1982  . TONSILLECTOMY  ~ 23    Family History  Problem Relation Age of Onset  . Diabetes Mother   . Heart disease Father   . Heart attack Father   . Diabetes Father   . Hypertension Father   . Diabetes Sister     Social History:  reports that he has quit smoking. His smoking use included Cigarettes. He smoked 0.00 packs per day for 0.00 years. He has never used smokeless tobacco. He reports that he drinks about 1.8 oz of alcohol per week . He reports that he does not use drugs.    Review of Systems   Unknown date of last eye exam       Lipids: He was previously on Vytorin and Lipitor. He thinks he had myalgias and weakness when taking these drugs.  He has been recommended  statin drugs in the lipid clinic but he refuses to take them stating that they will not help him.  Also has significantly increased LDL particle number   Also had been reluctant to take even Zetia   Now has been followed by cardiologist   Lab Results  Component Value Date   CHOL 154 12/10/2015   HDL 48.60 12/10/2015   LDLCALC 89 12/10/2015   TRIG 80.0 12/10/2015   CHOLHDL 3 12/10/2015    He has been  treated by cardiologist for his atrial fibrillation, is going to get cardioversion He says he feels fatigued with metoprolol higher doses  Physical Examination:  BP 132/80   Pulse (!) 58   Ht 5\' 6"  (1.676 m)   Wt 196 lb (88.9 kg)   BMI 31.64 kg/m   No pedal edema  ASSESSMENT:  Diabetes type 2, uncontrolled  He has had long-standing diabetes on insulin for over 15 years with usually poor control See history of present illness for details of his current management of problems identified in blood sugar patterns   He continues to have poor compliance with instructions for his diabetes management  Problems identified include  1. lack of adequate glucose monitoring at various times 2. Adjusting NPH based on meal size rather than fasting blood sugar patterns.  He was given a flowsheet on the last visit to use to titrate the bedtime dose but he does not do so 3. Arbitrary mealtime insulin dosing 4. Not understanding carbohydrate counting 5. Probable variable peak action of NPH given variable readings 6.  mostly high FASTING readings 7. Unclear how he is adjusting his mealtime doses 8. Frequent hypoglycemia during the day possibly from overestimating his mealtime insulin requirement  HYPERLIPIDEMIA improved with Crestor 3 times a week  PLAN:  Start monitoring blood sugars consistently at various times of the day including  after supper Take a consistent amount of 20 units of NPH at bedtime.    Continue glucose monitoring to evaluate blood sugar patterns and effects of various  meals, discussed how to keep a record of her food eaten and insulin doses on this flowsheet  He will subsequently discuss mealtime insulin adjustment with nurse educator  On his own he can try to keep a record of how his insulin dose works at meal time for various foods  Reduce mealtime insulin for lower carbohydrate meals and also when he starts getting more active  Discussed possibly using another insulin such as [place] or the U-500 regular but he does not want to change, been concerned  about the expense even of the NPH insulin    There are no Patient Instructions on file for this visit.   Counseling time on subjects discussed above is over 50% of today's 25 minute visit      KUMAR,AJAY 12/12/2015, 4:33 PM   Note: This office note was prepared with Dragon voice recognition system technology. Any transcriptional errors that result from this process are unintentional.

## 2015-12-18 ENCOUNTER — Encounter: Payer: Medicare HMO | Admitting: Nutrition

## 2015-12-24 ENCOUNTER — Telehealth: Payer: Self-pay | Admitting: Internal Medicine

## 2015-12-24 ENCOUNTER — Ambulatory Visit (INDEPENDENT_AMBULATORY_CARE_PROVIDER_SITE_OTHER): Payer: Medicare HMO | Admitting: Internal Medicine

## 2015-12-24 ENCOUNTER — Encounter: Payer: Self-pay | Admitting: Internal Medicine

## 2015-12-24 VITALS — BP 124/62 | HR 66 | Ht 66.0 in | Wt 197.0 lb

## 2015-12-24 DIAGNOSIS — I4892 Unspecified atrial flutter: Secondary | ICD-10-CM | POA: Diagnosis not present

## 2015-12-24 DIAGNOSIS — I483 Typical atrial flutter: Secondary | ICD-10-CM | POA: Diagnosis not present

## 2015-12-24 MED ORDER — AMIODARONE HCL 200 MG PO TABS: 200.0000 mg | ORAL_TABLET | Freq: Every day | ORAL | 3 refills | Status: DC

## 2015-12-24 MED ORDER — ASPIRIN EC 325 MG PO TBEC: 325.0000 mg | DELAYED_RELEASE_TABLET | Freq: Every day | ORAL | 0 refills | Status: DC

## 2015-12-24 MED ORDER — APIXABAN 5 MG PO TABS: 5.0000 mg | ORAL_TABLET | Freq: Two times a day (BID) | ORAL | 3 refills | Status: DC

## 2015-12-24 NOTE — Telephone Encounter (Signed)
New Message  Pt voiced he's calling back to let us know he's received nurses vm. Thanks!

## 2015-12-24 NOTE — Progress Notes (Signed)
HPI [name] returns today for followup. He is a pleasant 70 yo man with a h/o atrial flutter who underwent catheter ablation almost a year ago. He then had recurrent flutter which appeared to be typical and underwent EP study where he was found to have left atrial flutter. His atrial flutter isthmus was blocked. He was cardioverted back to NSR. He was placed on amiodarone after her QT interval prolonged too much on Tikosyn. He feels well. He denies chest pain or sob. No syncope.  Allergies  Allergen Reactions  . Actos [Pioglitazone] Swelling  . Bee Venom Swelling  . Codeine Nausea Only    REACTION: hives  . Statins     unknown  . Glucophage [Metformin Hcl] Nausea Only  . Raspberry Itching     Current Outpatient Prescriptions  Medication Sig Dispense Refill  . amiodarone (PACERONE) 200 MG tablet Take 1 tablet (200 mg total) by mouth daily. 180 tablet 3  . BD PEN NEEDLE NANO U/F 32G X 4 MM MISC Use 3-4 per day to inject insulin 200 each 1  . bimatoprost (LUMIGAN) 0.01 % SOLN Place 1 drop into the left eye at bedtime.    . Coenzyme Q10 (CO Q 10) 100 MG CAPS Take 1 capsule by mouth daily. (Patient taking differently: Take 100 mg by mouth 2 (two) times daily. ) 90 capsule 1  . glucose blood (ONETOUCH VERIO) test strip Use as instructed to check blood sugar 2 times per day dx code E11.59 200 each 1  . insulin lispro (HUMALOG KWIKPEN) 100 UNIT/ML KiwkPen Inject 0.12-0.15 mLs (12-15 Units total) into the skin 3 (three) times daily. 45 mL 1  . Insulin NPH, Human,, Isophane, (HUMULIN N) 100 UNIT/ML Kiwkpen Inject 26 Units into the skin daily. (Patient taking differently: Inject 26 Units into the skin at bedtime. ) 15 mL 2  . lisinopril (PRINIVIL,ZESTRIL) 5 MG tablet TAKE 1 TABLET BY MOUTH EVERY DAY (Patient taking differently: TAKE 1 TABLET (5 mg) BY MOUTH EVERY DAY) 30 tablet 0  . metoprolol (LOPRESSOR) 50 MG tablet Take 0.5 tablets (25 mg total) by mouth 2 (two) times daily. (Patient  taking differently: Take 50 mg by mouth 2 (two) times daily. ) 90 tablet 3  . ONETOUCH DELICA LANCETS 99991111 MISC Use to check blood sugar 2 times per day dx code E11.59 200 each 1  . rosuvastatin (CRESTOR) 5 MG tablet Take 5 mg by mouth daily.    . TURMERIC PO Take 450 mg by mouth daily.     Marland Kitchen aspirin EC 325 MG tablet Take 1 tablet (325 mg total) by mouth daily. 30 tablet 0   No current facility-administered medications for this visit.      Past Medical History:  Diagnosis Date  . Anemia   . Arthritis    "back, left ankle" (11/20/2015)  . CHF (congestive heart failure) ([place])   . Chronic lower back pain   . Coronary artery disease    a. 05/2009 CABG x 3: LIMA->LAD, VG->OM, VG->PDA; b. Nuc 01/2014: inf-lat scar but no ischemia, EF 57%.  . Diabetic retinopathy (HCC)    mild- Dr. Herbert Deaner  . Glaucoma   . Hyperlipidemia    a. Intolerant of lipitor and vytorin.  . Migraine    "once or twice" (11/20/2015)  . Myocardial infarction [facility])    'saw evidence of it on an EKG done in 2005"  . Paroxysmal atrial flutter ([place])    a. 01/2014 s/p DCCV;  b.  CHA2DS2VASc = 3-->Eliquis.  . Pneumonia 1960  . PTSD (post-traumatic stress disorder)    "X 2 yr post OHS" (10/31/2014)  . Sacral pain    "right"  . Sciatica of right side   . Testicular cancer ([place]) 1982   "heavy doses of radiation; it was stage 2"  . Type II diabetes mellitus ([place])   . Valvular heart disease    a. 01/2014 Echo: Ef 60-65%, no rwma, mild AI/MS, mod MR, mildly dil LA.    ROS:   All systems reviewed and negative except as noted in the HPI.   Past Surgical History:  Procedure Laterality Date  . ABDOMINAL EXPLORATION SURGERY  ~ 2005   "for hernia, but didn't have one"  . CARDIAC CATHETERIZATION  2011  . CARDIOVERSION N/A 02/19/2014   Procedure: CARDIOVERSION;  Surgeon: [name], MD;  Location: [facility] ENDOSCOPY;  Service: Cardiovascular;  Laterality: N/A;  . CARDIOVERSION N/A 10/28/2015   Procedure: CARDIOVERSION;   Surgeon: [name], MD;  Location: [facility] ENDOSCOPY;  Service: Cardiovascular;  Laterality: N/A;  . CARDIOVERSION  11/20/2015   "200 joules"  . CORONARY ARTERY BYPASS GRAFT  05/2009   LIMA-LAD, SVG-OM, SVG-PDA 06/20/09  . ELBOW FRACTURE SURGERY Left    broken ulna on left elbow-surgical repair  . ELECTROPHYSIOLOGIC STUDY N/A 10/31/2014   Procedure: A-Flutter;  Surgeon: [name], MD;  Location: [place] CV LAB;  Service: Cardiovascular;  Laterality: N/A;  . ELECTROPHYSIOLOGIC STUDY N/A 11/20/2015   Procedure: A-Flutter Ablation;  Surgeon: [name], MD;  Location: [place] CV LAB;  Service: Cardiovascular;  Laterality: N/A;  . FRACTURE SURGERY    . TESTICLE REMOVAL Right 1982  . TONSILLECTOMY  ~ 60     Family History  Problem Relation Age of Onset  . Diabetes Mother   . Heart disease Father   . Heart attack Father   . Diabetes Father   . Hypertension Father   . Diabetes Sister      Social History   Social History  . Marital status: Married    Spouse name: N/A  . Number of children: N/A  . Years of education: N/A   Occupational History  . Not on file.   Social History Main Topics  . Smoking status: Former Smoker    Packs/day: 0.00    Years: 0.00    Types: Cigarettes  . Smokeless tobacco: Never Used     Comment: "quit smoking cigarettes in the 1980's; don't know how much or for how long"  . Alcohol use 1.8 oz/week    3 Cans of beer per week  . Drug use: No     Comment: "quit smoking pot in the 1990s"  . Sexual activity: Not Currently    Birth control/ protection: Coitus interruptus   Other Topics Concern  . Not on file   Social History Narrative  . No narrative on file     BP 124/62   Pulse 66   Ht 5\' 6"  (1.676 m)   Wt 197 lb (89.4 kg)   SpO2 97%   BMI 31.80 kg/m   Physical Exam:  Well appearing 70 yo woman, NAD HEENT: Unremarkable Neck:  7 cm JVD, no thyromegally Back:  No CVA tenderness Lungs:  Clear with no wheezes HEART:   Regular rate rhythm, no murmurs, no rubs, no clicks Abd:  soft, positive bowel sounds, no organomegally, no rebound, no guarding Ext:  2 plus pulses, no edema, no cyanosis, no clubbing Skin:  No rashes no nodules Neuro:  CN II through XII intact, motor grossly intact  EKG - NSR   Assess/Plan: 1. Atrial flutter - he is maintaining NSR on amiodarone. He will continue 200 mg daily. We will have him reduce his dose of metoprolol. 2. Dyslipidemia - he will continue his statin 3. CAD, s/p CABG - he has no angina.  4. chronic diastolic heart failure - he is much improved now that he is in NSR.  5. Coags - He has a high CHADSVASC score. He will need to stay on a NOAC.  Mikle Bosworth.D.

## 2015-12-24 NOTE — Patient Instructions (Addendum)
    Medication Instructions:  Your physician has recommended you make the following change in your medication:  1) Decrease Amioarone to 200 mg daily 2) Decrease Metoprolol to 25 mg twice daily     Labwork: None ordered   Testing/Procedures: None ordered   Follow-Up: Your physician wants you to follow-up in: 6 months with Dr Knox Saliva will receive a reminder letter in the mail two months in advance. If you don't receive a letter, please call our office to schedule the follow-up appointment.   Any Other Special Instructions Will Be Listed Below (If Applicable).     If you need a refill on your cardiac medications before your next appointment, please call your pharmacy.

## 2015-12-24 NOTE — Addendum Note (Signed)
Addended by: Janan Halter F on: 12/24/2015 12:04 PM   Modules accepted: Orders

## 2016-01-02 ENCOUNTER — Other Ambulatory Visit: Payer: Self-pay

## 2016-01-02 MED ORDER — LISINOPRIL 5 MG PO TABS: ORAL_TABLET | ORAL | 2 refills | Status: DC

## 2016-01-13 DIAGNOSIS — M533 Sacrococcygeal disorders, not elsewhere classified: Secondary | ICD-10-CM | POA: Diagnosis not present

## 2016-01-13 DIAGNOSIS — M5441 Lumbago with sciatica, right side: Secondary | ICD-10-CM | POA: Diagnosis not present

## 2016-01-27 DIAGNOSIS — M5441 Lumbago with sciatica, right side: Secondary | ICD-10-CM | POA: Diagnosis not present

## 2016-02-04 DIAGNOSIS — M5441 Lumbago with sciatica, right side: Secondary | ICD-10-CM | POA: Diagnosis not present

## 2016-03-05 DIAGNOSIS — E119 Type 2 diabetes mellitus without complications: Secondary | ICD-10-CM | POA: Diagnosis not present

## 2016-03-05 DIAGNOSIS — H2513 Age-related nuclear cataract, bilateral: Secondary | ICD-10-CM | POA: Diagnosis not present

## 2016-03-05 DIAGNOSIS — H40023 Open angle with borderline findings, high risk, bilateral: Secondary | ICD-10-CM | POA: Diagnosis not present

## 2016-03-05 DIAGNOSIS — E113393 Type 2 diabetes mellitus with moderate nonproliferative diabetic retinopathy without macular edema, bilateral: Secondary | ICD-10-CM | POA: Diagnosis not present

## 2016-03-08 ENCOUNTER — Other Ambulatory Visit: Payer: Self-pay | Admitting: Endocrinology

## 2016-03-08 DIAGNOSIS — E1165 Type 2 diabetes mellitus with hyperglycemia: Secondary | ICD-10-CM

## 2016-03-08 DIAGNOSIS — Z794 Long term (current) use of insulin: Secondary | ICD-10-CM

## 2016-03-10 ENCOUNTER — Other Ambulatory Visit: Payer: Medicare HMO

## 2016-03-13 ENCOUNTER — Ambulatory Visit: Payer: Medicare HMO | Admitting: Endocrinology

## 2016-03-26 ENCOUNTER — Other Ambulatory Visit (INDEPENDENT_AMBULATORY_CARE_PROVIDER_SITE_OTHER): Payer: Medicare HMO

## 2016-03-26 DIAGNOSIS — Z794 Long term (current) use of insulin: Secondary | ICD-10-CM | POA: Diagnosis not present

## 2016-03-26 DIAGNOSIS — E785 Hyperlipidemia, unspecified: Secondary | ICD-10-CM | POA: Diagnosis not present

## 2016-03-26 DIAGNOSIS — E1165 Type 2 diabetes mellitus with hyperglycemia: Secondary | ICD-10-CM

## 2016-03-26 LAB — BASIC METABOLIC PANEL
BUN: 19 mg/dL (ref 6–23)
CO2: 30 mEq/L (ref 19–32)
Calcium: 9.1 mg/dL (ref 8.4–10.5)
Chloride: 103 mEq/L (ref 96–112)
Creatinine, Ser: 1.24 mg/dL (ref 0.40–1.50)
GFR: 61.63 mL/min (ref >60.00)
Glucose, Bld: 163 mg/dL — ABNORMAL HIGH (ref 70–99)
Potassium: 4.8 mEq/L (ref 3.5–5.1)
Sodium: 139 mEq/L (ref 135–145)

## 2016-03-26 LAB — LIPID PANEL
Cholesterol: 170 mg/dL (ref 0–200)
HDL: 48.9 mg/dL (ref >39.00)
LDL Cholesterol: 109 mg/dL — ABNORMAL HIGH (ref 0–99)
NonHDL: 121.58
Total CHOL/HDL Ratio: 3
Triglycerides: 64 mg/dL (ref 0.0–149.0)
VLDL: 12.8 mg/dL (ref 0.0–40.0)

## 2016-03-26 LAB — HEMOGLOBIN A1C: Hgb A1c MFr Bld: 9 % — ABNORMAL HIGH (ref 4.6–6.5)

## 2016-03-31 ENCOUNTER — Telehealth: Payer: Self-pay | Admitting: Endocrinology

## 2016-03-31 NOTE — Telephone Encounter (Signed)
Patient went to pharmacy to pick up medication, pharmacy stated he was not a patient here at our office.  insulin lispro (HUMALOG KWIKPEN) 100 UNIT/ML KiwkPen  Insulin NPH, Human,, Isophane, (HUMULIN N) 100 UNIT/ML Kiwkp please advise

## 2016-04-01 ENCOUNTER — Other Ambulatory Visit: Payer: Self-pay

## 2016-04-01 ENCOUNTER — Encounter: Payer: Self-pay | Admitting: Endocrinology

## 2016-04-01 ENCOUNTER — Ambulatory Visit (INDEPENDENT_AMBULATORY_CARE_PROVIDER_SITE_OTHER): Payer: Medicare HMO | Admitting: Endocrinology

## 2016-04-01 VITALS — BP 138/80 | HR 70 | Ht 66.0 in | Wt 198.0 lb

## 2016-04-01 DIAGNOSIS — E1165 Type 2 diabetes mellitus with hyperglycemia: Secondary | ICD-10-CM | POA: Diagnosis not present

## 2016-04-01 DIAGNOSIS — Z794 Long term (current) use of insulin: Secondary | ICD-10-CM

## 2016-04-01 DIAGNOSIS — Z23 Encounter for immunization: Secondary | ICD-10-CM | POA: Diagnosis not present

## 2016-04-01 MED ORDER — BD PEN NEEDLE NANO U/F 32G X 4 MM MISC: 1 refills | Status: DC

## 2016-04-01 MED ORDER — INSULIN LISPRO 100 UNIT/ML (KWIKPEN): 12.0000 [IU] | PEN_INJECTOR | Freq: Three times a day (TID) | SUBCUTANEOUS | 1 refills | Status: DC

## 2016-04-01 NOTE — Telephone Encounter (Signed)
Please call in the humalog kwikpen and the pen needles asap to the [facility] at [address] pt is currently at the pharmacy

## 2016-04-01 NOTE — Patient Instructions (Addendum)
Use Calorie [employer] App to look up carbohydrates  Most likely will need to divide the grams of carbohydrates by 4-5 to get the insulin doses at meals Add extra Humalog for higher fat meals or beer  Must check sugar 2 hrs after at least 1 meal daily  Keep a record of  meals on 3 days, blood sugars before and after eating and insulin dose for review with Vaughan Basta  Check blood sugar before and after exercise  Check on the availability of Freestyle Libre sensor on insurance

## 2016-04-01 NOTE — Progress Notes (Signed)
Patient ID: Matthew [name], male   DOB: 18-Jun-1945, 70 y.o.   MRN: [id number]           Reason for Appointment: Followup for Type 2 Diabetes  Referring physician: Criss Rosales  History of Present Illness:          Diagnosis: Type 2 diabetes mellitus, date of diagnosis:  1992      Past history: His blood sugar was high at diagnosis when he was having a routine screening done. He was started on Glucophage initially which he took for about a year. He thinks it did not help his sugar much and he did not feel good with it Apparently he was trying to control his diabetes with diet and exercise for a few years. He may have tried Glucophage again before going on insulin. Also was started on Actos which caused swelling He has been treated mostly with premixed insulin for about 15 years and his level of control appears to be inadequate although details of previous treatment are not available. He may have taken Humalog at one time for use with high sugars A1c was 11.5 in 2011 On his initial consultation in 9/15 because of poor control with premixed insulin he was switched to Levemir twice a day and Humalog with meals.  Recent history:   INSULIN regimen is described as:20 N hs; Humalog 15-16 units 3 times a day with meals   Again his A1c was 9%   Current management, blood sugar patterns and problems:  Checking blood sugar very irregularly again despite reminders to check consistently.  His fasting blood sugars have been extremely variable with the range of 101-367  He thinks his blood sugars in the morning depending on how he is eating the night before  However he does not check his sugars after supper usually unless he is not feeling well  He does not adjust his insulin based on the type of meals he is having.  Again he probably adds to have significantly high fat meals at times since he sees his blood sugars higher the next morning with this kind of food  BEDTIME insulin: He was told to adjust  this only based on fasting readings but he is generally taking about 20 units, occasionally will take 22 based on what he is eating  He had a significantly high reading of 326 late afternoon probably from what he is eating at lunchtime  Not clear if he is taking Humalog consistently with every meal  However he still has an occasional low blood sugar overnight, recently documented reading of 56 at 2 AM  He exercises a few days a week, does not think it drops his blood sugars  Although he thinks he previously had tried carbohydrate counting he is not doing this now because he does not have any resources  With hypoglycemia he has symptoms of weakness, sweating, shakiness and confusion Treating low sugars with orange juice       Oral hypoglycemic drugs the patient is taking are: None      Side effects from medications have been: Edema from Actos,? Nausea and malaise from metformin Compliance with the medical regimen: Fair  Glucose monitoring:  done   one time a day         Glucometer: One Touch.      Blood Glucose readings by  download:   Mean values apply above for all meters except median for One Touch  PRE-MEAL Fasting Lunch Dinner 10 PM  Overall  Glucose  range: 101-367   326, 82  60    Mean/median: 200     115    Self-care:   Meals: 3 meals per day.  Bfst 8:30, 1 pm Dinner is about 8 PM.  Some meals are high fat      Exercise: playing Racquetball about 2 days a week mostly in morning before breakfast , sometimes late afternoon           Dietician visit, most recent:?  15 years ago.     CDE visit: 3/16            Weight history:  Wt Readings from Last 3 Encounters:  04/01/16 198 lb (89.8 kg)  12/24/15 197 lb (89.4 kg)  12/12/15 196 lb (88.9 kg)    Glycemic control:   Lab Results  Component Value Date   HGBA1C 9.0 (H) 03/26/2016   HGBA1C 9.0 (H) 10/07/2015   HGBA1C 8.6 07/18/2015   Lab Results  Component Value Date   MICROALBUR 3.4 (H) 07/18/2015   LDLCALC 109 (H)  03/26/2016   CREATININE 1.24 03/26/2016    Lab on 03/26/2016  Component Date Value Ref Range Status  . Hgb A1c MFr Bld 03/26/2016 9.0* 4.6 - 6.5 % Final  . Sodium 03/26/2016 139  135 - 145 mEq/L Final  . Potassium 03/26/2016 4.8  3.5 - 5.1 mEq/L Final  . Chloride 03/26/2016 103  96 - 112 mEq/L Final  . CO2 03/26/2016 30  19 - 32 mEq/L Final  . Glucose, Bld 03/26/2016 163* 70 - 99 mg/dL Final  . BUN 03/26/2016 19  6 - 23 mg/dL Final  . Creatinine, Ser 03/26/2016 1.24  0.40 - 1.50 mg/dL Final  . Calcium 03/26/2016 9.1  8.4 - 10.5 mg/dL Final  . GFR 03/26/2016 61.63  >60.00 mL/min Final  . Cholesterol 03/26/2016 170  0 - 200 mg/dL Final  . Triglycerides 03/26/2016 64.0  0.0 - 149.0 mg/dL Final  . HDL 03/26/2016 48.90  >39.00 mg/dL Final  . VLDL 03/26/2016 12.8  0.0 - 40.0 mg/dL Final  . LDL Cholesterol 03/26/2016 109* 0 - 99 mg/dL Final  . Total CHOL/HDL Ratio 03/26/2016 3   Final  . NonHDL 03/26/2016 121.58   Final        Medication List       Accurate as of 04/01/16 11:59 PM. Always use your most recent med list.          amiodarone 200 MG tablet Commonly known as:  PACERONE Take 1 tablet (200 mg total) by mouth daily.   apixaban 5 MG Tabs tablet Commonly known as:  ELIQUIS Take 1 tablet (5 mg total) by mouth 2 (two) times daily.   BD PEN NEEDLE NANO U/F 32G X 4 MM Misc Generic drug:  Insulin Pen Needle Use 3-4 per day to inject insulin   bimatoprost 0.01 % Soln Commonly known as:  LUMIGAN Place 1 drop into the left eye at bedtime.   Co Q 10 100 MG Caps Take 1 capsule by mouth daily.   glucose blood test strip Commonly known as:  ONETOUCH VERIO Use as instructed to check blood sugar 2 times per day dx code E11.59   insulin lispro 100 UNIT/ML KiwkPen Commonly known as:  HUMALOG KWIKPEN Inject 0.12-0.15 mLs (12-15 Units total) into the skin 3 (three) times daily.   Insulin NPH (Human) ([place]) 100 UNIT/ML Kiwkpen Commonly known as:  HUMULIN N Inject 26  Units into the skin daily.   lisinopril 5 MG tablet  Commonly known as:  PRINIVIL,ZESTRIL TAKE 1 TABLET (5 mg) BY MOUTH EVERY DAY   metoprolol 50 MG tablet Commonly known as:  LOPRESSOR Take 0.5 tablets (25 mg total) by mouth 2 (two) times daily.   ONETOUCH DELICA LANCETS 99991111 Misc Use to check blood sugar 2 times per day dx code E11.59   rosuvastatin 5 MG tablet Commonly known as:  CRESTOR Take 5 mg by mouth daily.   TURMERIC PO Take 450 mg by mouth daily.       Allergies:  Allergies  Allergen Reactions  . Actos [Pioglitazone] Swelling  . Bee Venom Swelling  . Codeine Nausea Only    REACTION: hives  . Statins     unknown  . Glucophage [Metformin Hcl] Nausea Only  . Raspberry Itching    Past Medical History:  Diagnosis Date  . Anemia   . Arthritis    "back, left ankle" (11/20/2015)  . CHF (congestive heart failure) ([place])   . Chronic lower back pain   . Coronary artery disease    a. 05/2009 CABG x 3: LIMA->LAD, VG->OM, VG->PDA; b. Nuc 01/2014: inf-lat scar but no ischemia, EF 57%.  . Diabetic retinopathy ([facility])    mild- Dr. Herbert Deaner  . Glaucoma   . Hyperlipidemia    a. Intolerant of lipitor and vytorin.  . Migraine    "once or twice" (11/20/2015)  . Myocardial infarction    'saw evidence of it on an EKG done in 2005"  . Paroxysmal atrial flutter ([place])    a. 01/2014 s/p DCCV;  b. CHA2DS2VASc = 3-->Eliquis.  . Pneumonia 1960  . PTSD (post-traumatic stress disorder)    "X 2 yr post OHS" (10/31/2014)  . Sacral pain    "right"  . Sciatica of right side   . Testicular cancer ([place]) 1982   "heavy doses of radiation; it was stage 2"  . Type II diabetes mellitus ([place])   . Valvular heart disease    a. 01/2014 Echo: Ef 60-65%, no rwma, mild AI/MS, mod MR, mildly dil LA.    Past Surgical History:  Procedure Laterality Date  . ABDOMINAL EXPLORATION SURGERY  ~ 2005   "for hernia, but didn't have one"  . CARDIAC CATHETERIZATION  2011  . CARDIOVERSION N/A 02/19/2014    Procedure: CARDIOVERSION;  Surgeon: [name], MD;  Location: [facility] ENDOSCOPY;  Service: Cardiovascular;  Laterality: N/A;  . CARDIOVERSION N/A 10/28/2015   Procedure: CARDIOVERSION;  Surgeon: [name], MD;  Location: [facility] ENDOSCOPY;  Service: Cardiovascular;  Laterality: N/A;  . CARDIOVERSION  11/20/2015   "200 joules"  . CORONARY ARTERY BYPASS GRAFT  05/2009   LIMA-LAD, SVG-OM, SVG-PDA 06/20/09  . ELBOW FRACTURE SURGERY Left    broken ulna on left elbow-surgical repair  . ELECTROPHYSIOLOGIC STUDY N/A 10/31/2014   Procedure: A-Flutter;  Surgeon: [name], MD;  Location: [facility];  Service: Cardiovascular;  Laterality: N/A;  . ELECTROPHYSIOLOGIC STUDY N/A 11/20/2015   Procedure: A-Flutter Ablation;  Surgeon: [name], MD;  Location: [facility];  Service: Cardiovascular;  Laterality: N/A;  . FRACTURE SURGERY    . TESTICLE REMOVAL Right 1982  . TONSILLECTOMY  ~ 74    Family History  Problem Relation Age of Onset  . Diabetes Mother   . Heart disease Father   . Heart attack Father   . Diabetes Father   . Hypertension Father   . Diabetes Sister     Social History:  reports that he has  quit smoking. His smoking use included Cigarettes. He smoked 0.00 packs per day for 0.00 years. He has never used smokeless tobacco. He reports that he drinks about 1.8 oz of alcohol per week . He reports that he does not use drugs.    Review of Systems         Lipids: He was previously on Vytorin and Lipitor. He thinks he had myalgias and weakness when taking these drugs.  He has been recommended statin drugs in the lipid clinic but he refuses to take them stating that they will not help him.  Also has significantly increased LDL particle number   Also had been reluctant to take even Zetia   More recently has been followed by cardiologist and is trying to take Crestor, not daily though with some improvement in levels   Lab Results  Component Value Date   CHOL  170 03/26/2016   HDL 48.90 03/26/2016   LDLCALC 109 (H) 03/26/2016   TRIG 64.0 03/26/2016   CHOLHDL 3 03/26/2016    He has been  treated by cardiologist for his atrial fibrillation, No recurrence   Physical Examination:  BP 138/80   Pulse 70   Ht 5\' 6"  (1.676 m)   Wt 198 lb (89.8 kg)   SpO2 96%   BMI 31.96 kg/m   No pedal edema  ASSESSMENT:  Diabetes type 2, uncontrolled  He has had long-standing diabetes on insulin for over 15 years with usually poor control See history of present illness for details of his current management of problems identified in blood sugar patterns   He continues to have poor compliance with instructions for his diabetes management and A1c is still high at 9% Factors responsible for high sugars including high-fat diet, inconsistent carbohydrate intake, and consistent mealtimes-possible noncompliance with mealtime insulin, variability of blood sugars with using NPH insulin and sometimes alcohol intake  HYPERLIPIDEMIA improved with Crestor 3 times a week  PLAN:  Start monitoring blood sugars at least once a day consistently at various times of the day including  after supper Take a consistent amount of 20 units of NPH at bedtime.   He was given information on the freestyle [place] and he will try to see if this is covered. He was given booklet on carbohydrate counting He can try using a carb ratio for about 1:4 or 5 to see if this would help consistently cover his meals Discussed postprandial blood sugar targets with starting carbohydrate counting. Needs to avoid high-fat meals but if he does he will need to take extra insulin after eating when blood sugar goes up He will use a resource on his iPhone to help with carbohydrate counting. He will keep a three-day record of food intake and insulin/blood sugars and review with nurse educator  He was advised to follow up in couple of months but he refuses to dose this and wants to see doctors less  often  Prevnar given at his request Influenza vaccine given   Patient Instructions  Use Calorie [place] App to look up carbohydrates  Most likely will need to divide the grams of carbohydrates by 4-5 to get the insulin doses at meals Add extra Humalog for higher fat meals or beer  Must check sugar 2 hrs after at least 1 meal daily  Keep a record of  meals on 3 days, blood sugars before and after eating and insulin dose for review with [facility]  Check blood sugar before and after exercise  Check on the  availability of Freestyle [place] sensor on insurance    Counseling time on subjects discussed above is over 50% of today's 25 minute visit      KUMAR,AJAY 04/02/2016, 12:59 PM   Note: This office note was prepared with Estate agent. Any transcriptional errors that result from this process are unintentional.

## 2016-04-01 NOTE — Telephone Encounter (Signed)
Ordered 04/01/16 

## 2016-04-13 DIAGNOSIS — R69 Illness, unspecified: Secondary | ICD-10-CM | POA: Diagnosis not present

## 2016-05-04 ENCOUNTER — Encounter: Payer: Medicare HMO | Admitting: Nutrition

## 2016-05-13 ENCOUNTER — Encounter: Payer: Medicare HMO | Admitting: Nutrition

## 2016-05-19 ENCOUNTER — Encounter: Payer: Medicare HMO | Admitting: Nutrition

## 2016-05-28 ENCOUNTER — Telehealth: Payer: Self-pay | Admitting: Endocrinology

## 2016-05-28 NOTE — Telephone Encounter (Signed)
Pt came by and wanted to let us know that his formulary has changed and that he will no longer be about to get the Humulog and the Humulin N, his insurance is now requiring him to get Novolog and Novolin N. He requests that this be sent to [facility] on [address]. CB # [id number]

## 2016-05-28 NOTE — Telephone Encounter (Signed)
Ok to change

## 2016-05-29 ENCOUNTER — Other Ambulatory Visit: Payer: Self-pay

## 2016-05-29 MED ORDER — INSULIN ASPART 100 UNIT/ML ~~LOC~~ SOLN: SUBCUTANEOUS | 5 refills | Status: DC

## 2016-05-29 MED ORDER — INSULIN NPH (HUMAN) (ISOPHANE) 100 UNIT/ML ~~LOC~~ SUSP: SUBCUTANEOUS | 11 refills | Status: DC

## 2016-05-29 NOTE — Telephone Encounter (Signed)
Ordered 05/29/16

## 2016-06-17 ENCOUNTER — Telehealth: Payer: Self-pay | Admitting: Endocrinology

## 2016-06-17 NOTE — Telephone Encounter (Signed)
Pt called in to speak with Lattie Haw, he said it was having to do with his insulin. Please call patient.

## 2016-06-18 ENCOUNTER — Other Ambulatory Visit: Payer: Self-pay

## 2016-06-18 MED ORDER — BD PEN NEEDLE NANO U/F 32G X 4 MM MISC: 1 refills | Status: DC

## 2016-06-18 MED ORDER — INSULIN NPH (HUMAN) (ISOPHANE) 100 UNIT/ML ~~LOC~~ SUSP
SUBCUTANEOUS | 11 refills | Status: DC
Start: 2016-06-18 — End: 2017-08-17

## 2016-06-18 MED ORDER — INSULIN ASPART 100 UNIT/ML ~~LOC~~ SOLN: SUBCUTANEOUS | 5 refills | Status: DC

## 2016-06-18 NOTE — Telephone Encounter (Signed)
Ordered 06/18/16

## 2016-06-18 NOTE — Telephone Encounter (Signed)
°  Patient is calling on the status of insulin  insulin NPH Human (NOVOLIN N) 100 UNIT/ML injection insulin aspart (NOVOLOG) 100 UNIT/MLinjection  He needs 120 syringes a months

## 2016-06-18 NOTE — Telephone Encounter (Signed)
LM for pt to call back to get further info on this request

## 2016-06-19 ENCOUNTER — Other Ambulatory Visit: Payer: Self-pay

## 2016-06-19 MED ORDER — INSULIN ASPART 100 UNIT/ML ~~LOC~~ SOLN: SUBCUTANEOUS | 5 refills | Status: DC

## 2016-06-25 ENCOUNTER — Telehealth: Payer: Self-pay | Admitting: Endocrinology

## 2016-06-25 MED ORDER — "INSULIN SYRINGE 31G X 5/16"" 1 ML MISC": 2 refills | Status: DC

## 2016-06-25 NOTE — Telephone Encounter (Signed)
Rx submitted for insulin syringes.

## 2016-06-25 NOTE — Telephone Encounter (Signed)
Pt needs the syringes called in please the pharmacy has a rx for pen needles

## 2016-07-23 ENCOUNTER — Other Ambulatory Visit: Payer: Medicare HMO

## 2016-07-29 ENCOUNTER — Ambulatory Visit: Payer: Medicare HMO | Admitting: Endocrinology

## 2016-08-03 NOTE — Progress Notes (Signed)
Cardiology Office Note Date:  08/05/2016  Patient ID:  Matthew [name], DOB May 04, 1945, MRN [id number] PCP:  Elyn Peers, MD  Cardiologist:  Dr. Lovena Le    Chief Complaint:  Patient with concerns regarding possible medication side effects  History of Present Illness: Matthew [name] is a 71 y.o. male with history of CAD hx of CABG 2011, AFlutter ablated, recurrent AFlutter with atypical morphology >> EPS noted LA flutter his atrial flutter isthmus was blocked, initially on Tikosyn with prolongation of his QT eventually on amiodarone, DM.  He comes in today to be seen for Dr. Lovena Le.  Last seen by him in [place], was doing well at that time, no changes were made.  He comes in today with a list of complaints/observations, including extreme weakness/fatigue, hands trembling, unsteady gait, dizziness, double vision feeling cold especially his feet, swelling of LE, double vision on occasion, unusual and vivid dreams, hoarseness, brittle nails, hair loss, and weight gain.    He tells me as soon as he was put on the amiodarone he felt tired, poor, though with reduction of the dose he did feel well, his wife says actually felt pretty well for a while until he was restarted on the statin, this seems to have been the last catalyst/trigger to much of his symptomatolgy.  He has not had any kind of CP, no SOB.  It looks as if he has been on the crestor since before the amiodarone, but the patient clarifies, for certain he was off the statin.  He is using his lasix only every couple weeks for intermittent swelling.  The symptoms as a whole have been somewhat generalized, without acute onset no focal or localized weaknesses, no headaches.  He has not had near syncope or syncope.  He denies any bleeding or signs of bleeding.   AFlutter Hx: Ablation of typical AFlutter 10/31/14 EPS 11/20/15:  LA flutter > DCCV AAD Hx: July 2017 started on amiodarone Hx of QT prolongation on Tikosyn   Past Medical  History:  Diagnosis Date  . Anemia   . Arthritis    "back, left ankle" (11/20/2015)  . CHF (congestive heart failure) ([place])   . Chronic lower back pain   . Coronary artery disease    a. 05/2009 CABG x 3: LIMA->LAD, VG->OM, VG->PDA; b. Nuc 01/2014: inf-lat scar but no ischemia, EF 57%.  . Diabetic retinopathy (HCC)    mild- Dr. Herbert Deaner  . Glaucoma   . Hyperlipidemia    a. Intolerant of lipitor and vytorin.  . Migraine    "once or twice" (11/20/2015)  . Myocardial infarction    'saw evidence of it on an EKG done in 2005"  . Paroxysmal atrial flutter ([place])    a. 01/2014 s/p DCCV;  b. CHA2DS2VASc = 3-->Eliquis.  . Pneumonia 1960  . PTSD (post-traumatic stress disorder)    "X 2 yr post OHS" (10/31/2014)  . Sacral pain    "right"  . Sciatica of right side   . Testicular cancer ([place]) 1982   "heavy doses of radiation; it was stage 2"  . Type II diabetes mellitus ([place])   . Valvular heart disease    a. 01/2014 Echo: Ef 60-65%, no rwma, mild AI/MS, mod MR, mildly dil LA.    Past Surgical History:  Procedure Laterality Date  . ABDOMINAL EXPLORATION SURGERY  ~ 2005   "for hernia, but didn't have one"  . CARDIAC CATHETERIZATION  2011  . CARDIOVERSION N/A 02/19/2014   Procedure: CARDIOVERSION;  Surgeon:  [name], MD;  Location: [facility] ENDOSCOPY;  Service: Cardiovascular;  Laterality: N/A;  . CARDIOVERSION N/A 10/28/2015   Procedure: CARDIOVERSION;  Surgeon: [name], MD;  Location: [facility] ENDOSCOPY;  Service: Cardiovascular;  Laterality: N/A;  . CARDIOVERSION  11/20/2015   "200 joules"  . CORONARY ARTERY BYPASS GRAFT  05/2009   LIMA-LAD, SVG-OM, SVG-PDA 06/20/09  . ELBOW FRACTURE SURGERY Left    broken ulna on left elbow-surgical repair  . ELECTROPHYSIOLOGIC STUDY N/A 10/31/2014   Procedure: A-Flutter;  Surgeon: [name], MD;  Location: [place] CV LAB;  Service: Cardiovascular;  Laterality: N/A;  . ELECTROPHYSIOLOGIC STUDY N/A 11/20/2015   Procedure: A-Flutter Ablation;   Surgeon: [name], MD;  Location: [place] CV LAB;  Service: Cardiovascular;  Laterality: N/A;  . FRACTURE SURGERY    . TESTICLE REMOVAL Right 1982  . TONSILLECTOMY  ~ 1967    Current Outpatient Prescriptions  Medication Sig Dispense Refill  . amiodarone (PACERONE) 200 MG tablet Take 1 tablet (200 mg total) by mouth daily. (Patient taking differently: Take 100 mg by mouth daily. ) 180 tablet 3  . apixaban (ELIQUIS) 5 MG TABS tablet Take 1 tablet (5 mg total) by mouth 2 (two) times daily. 180 tablet 3  . BD PEN NEEDLE NANO U/F 32G X 4 MM MISC Use 3-4 per day to inject insulin 200 each 1  . bimatoprost (LUMIGAN) 0.01 % SOLN Place 1 drop into the left eye at bedtime.    . Coenzyme Q10 (CO Q 10) 100 MG CAPS Take 1 capsule by mouth daily. (Patient taking differently: Take 100 mg by mouth daily. ) 90 capsule 1  . furosemide (LASIX) 40 MG tablet Take 40 mg by mouth. Once every two weeks    . glucose blood (ONETOUCH VERIO) test strip Use as instructed to check blood sugar 2 times per day dx code E11.59 200 each 1  . insulin aspart (NOVOLOG) 100 UNIT/ML injection Inject 12-15 units in the skin 3 times daily 30 mL 5  . insulin NPH Human (NOVOLIN N) 100 UNIT/ML injection Inject 26 units in the skin daily 10 mL 11  . Insulin Syringe-Needle U-100 (INSULIN SYRINGE 1CC/31GX5/16") 31G X 5/16" 1 ML MISC Use to inject insulin 4 times per day. 200 each 2  . lisinopril (PRINIVIL,ZESTRIL) 5 MG tablet TAKE 1 TABLET (5 mg) BY MOUTH EVERY DAY 90 tablet 2  . metoprolol (LOPRESSOR) 50 MG tablet Take 0.5 tablets (25 mg total) by mouth 2 (two) times daily. (Patient taking differently: Take 50 mg by mouth 2 (two) times daily. ) 90 tablet 3  . ONETOUCH DELICA LANCETS 41D MISC Use to check blood sugar 2 times per day dx code E11.59 200 each 1  . rosuvastatin (CRESTOR) 5 MG tablet Take 5 mg by mouth daily. mon wed and fri    . TURMERIC PO Take 450 mg by mouth daily.      No current facility-administered medications  for this visit.     Allergies:   Actos [pioglitazone]; Bee venom; Codeine; Statins; Glucophage [metformin hcl]; and Raspberry   Social History:  The patient  reports that he has quit smoking. His smoking use included Cigarettes. He smoked 0.00 packs per day for 0.00 years. He has never used smokeless tobacco. He reports that he drinks about 1.8 oz of alcohol per week . He reports that he does not use drugs.   Family History:  The patient's family history includes Diabetes in his father, mother,  and sister; Heart attack in his father; Heart disease in his father; Hypertension in his father.  ROS:  Please see the history of present illness.  All other systems are reviewed and otherwise negative.   PHYSICAL EXAM:  VS:  BP (!) 154/78   Pulse 66   Ht 5\' 6"  (1.676 m)   Wt 203 lb (92.1 kg)   BMI 32.77 kg/m  BMI: Body mass index is 32.77 kg/m. Well nourished, well developed, in no acute distress  HEENT: normocephalic, atraumatic  Neck: no JVD, carotid bruits or masses Cardiac:  RRR; no significant murmurs, no rubs, or gallops Lungs:   CTA b/l, no wheezing, rhonchi or rales  Abd: soft, nontender MS: no deformity or atrophy Ext: trace-1+ edema b/l LE Skin: warm and dry, no rash Neuro:  No gross deficits appreciated Psych: euthymic mood, full affect   EKG:  Done today and reviewed by myself is SB, 58bpm, RBBB, PR 122ms, QRS 13ms  02/05/14: TTE Study Conclusions - Left ventricle: The cavity size was normal. There was mild focal basal hypertrophy of the septum. Systolic function was normal. The estimated ejection fraction was in the range of 60% to 65%. Wall motion was normal; there were no regional wall motion abnormalities. - Aortic valve: Valve mobility was restricted. There was mild regurgitation. Mean gradient (S): 7 mm Hg. Peak gradient (S): 12 mm Hg. Valve area (VTI): 2.03 cm^2. Valve area (Vmean): 2.02 cm^2. - Mitral valve: Calcified annulus. Mildly thickened  leaflets . The findings are consistent with mild stenosis. There was moderate regurgitation. Valve area by continuity equation (using LVOT flow): 2.29 cm^2. - Left atrium: The atrium was mildly dilated. - Right ventricle: Systolic function was mildly reduced. - Right atrium: The atrium was mildly dilated. Impressions: - Normal LV function; mild biatrial enlargement; thickened MV; moderate MR; mild MS by mean gradient (6 mmHg); calcified aortic valve but no significant AS by doppler; mild AI.  02/06/14: stress myoview Impression Exercise Capacity:  Poor exercise capacity. BP Response:  Normal blood pressure response. Clinical Symptoms:  There is dyspnea. ECG Impression:  A-fib with RVR at baseline and with mild exercise. Comparison with Prior Nuclear Study: No previous nuclear study performed Overall Impression:  Intermediate risk stress nuclear study with large-sized (Extent 18%), severe intensity fixed inferolateral defect consitent with LCx territory scar. LV Ejection Fraction: 57%.  LV Wall Motion:  inferolateral akinesis   Recent Labs: 10/07/2015: TSH 2.97 11/15/2015: Hemoglobin 12.3; Platelets 275 11/22/2015: Magnesium 2.3 12/10/2015: ALT 15 03/26/2016: BUN 19; Creatinine, Ser 1.24; Potassium 4.8; Sodium 139  03/26/2016: Cholesterol 170; HDL 48.90; LDL Cholesterol 109; Total CHOL/HDL Ratio 3; Triglycerides 64.0; VLDL 12.8   CrCl cannot be calculated (Patient's most recent lab result is older than the maximum 21 days allowed.).   Wt Readings from Last 3 Encounters:  08/05/16 203 lb (92.1 kg)  04/01/16 198 lb (89.8 kg)  12/24/15 197 lb (89.4 kg)     Other studies reviewed: Additional studies/records reviewed today include: summarized above  ASSESSMENT AND PLAN:  1. Parpxysmal AFlutter     CHA2DS2Vasc is at least 4, on Eliquis  2. CAD     no anginal sounding c/o     On ASA, BB, low dose statin  3. Multiple symptoms as described     Seems onset post statin,  though ? Amiodarone as well  Will make one change and stop the statin today and will get LFTs/TSH, BMET and CBC given his medicines, I discussed with Dr. Lovena Le,  given in SR today, would continue amiodarone (never intended to be long-term, but a bridge to ablative therapies, to have a consult with Dr. Curt Bears to discuss ablation.  4. Edema, denies SOB     His weight is up a few pounds     He takes his lasix only every couple weeks     Take lasix daily for 3 days, pending labs for further   5. Known statin intolerance     He might benefit from [facility] consult for PCSK9   Disposition:  Stop the statin, get labs today, consult with Dr. Curt Bears at his next available.  Lasix for his edema, pending labs for further.   Not discussed while the patient was here, I will also have him scheduled for an echo and carotids US.  Current medicines are reviewed at length with the patient today.   Haywood Lasso, PA-C 08/05/2016 11:26 AM     [place] [address] [phone] (office)  [phone] (fax)

## 2016-08-04 ENCOUNTER — Encounter: Payer: Self-pay | Admitting: Physician Assistant

## 2016-08-05 ENCOUNTER — Ambulatory Visit (INDEPENDENT_AMBULATORY_CARE_PROVIDER_SITE_OTHER): Payer: Medicare HMO | Admitting: Physician Assistant

## 2016-08-05 ENCOUNTER — Encounter (INDEPENDENT_AMBULATORY_CARE_PROVIDER_SITE_OTHER): Payer: Self-pay

## 2016-08-05 VITALS — BP 154/78 | HR 66 | Ht 66.0 in | Wt 203.0 lb

## 2016-08-05 DIAGNOSIS — Z79899 Other long term (current) drug therapy: Secondary | ICD-10-CM | POA: Diagnosis not present

## 2016-08-05 DIAGNOSIS — I2510 Atherosclerotic heart disease of native coronary artery without angina pectoris: Secondary | ICD-10-CM

## 2016-08-05 DIAGNOSIS — R609 Edema, unspecified: Secondary | ICD-10-CM

## 2016-08-05 DIAGNOSIS — R42 Dizziness and giddiness: Secondary | ICD-10-CM

## 2016-08-05 DIAGNOSIS — I4892 Unspecified atrial flutter: Secondary | ICD-10-CM | POA: Diagnosis not present

## 2016-08-05 DIAGNOSIS — R002 Palpitations: Secondary | ICD-10-CM

## 2016-08-05 DIAGNOSIS — I484 Atypical atrial flutter: Secondary | ICD-10-CM

## 2016-08-05 NOTE — Patient Instructions (Addendum)
Medication Instructions:   STOP TAKING CRESTOR 5 MG ONCE A DAY   TAKE LASIX ONCE A DAY FOR THREE DAYS ONLY AND THEN RESUME BACK TO NORMAL DOSE   If you need a refill on your cardiac medications before your next appointment, please call your pharmacy.  Labwork: BMET CBC TSH AND LFT TODAY    Testing/Procedures: NONE ORDERED  TODAY   Follow-Up: NEXT WEEK WITH  DR CAMNITZ FOR DISCUSSION OF ABLATION   Any Other Special Instructions Will Be Listed Below (If Applicable).

## 2016-08-06 ENCOUNTER — Telehealth (HOSPITAL_COMMUNITY): Payer: Self-pay | Admitting: Physician Assistant

## 2016-08-06 ENCOUNTER — Other Ambulatory Visit: Payer: Self-pay | Admitting: *Deleted

## 2016-08-06 DIAGNOSIS — Z79899 Other long term (current) drug therapy: Secondary | ICD-10-CM

## 2016-08-06 LAB — BASIC METABOLIC PANEL
BUN/Creatinine Ratio: 18 (ref 10–24)
BUN: 32 mg/dL — ABNORMAL HIGH (ref 8–27)
CO2: 27 mmol/L (ref 18–29)
Calcium: 9.3 mg/dL (ref 8.6–10.2)
Chloride: 95 mmol/L — ABNORMAL LOW (ref 96–106)
Creatinine, Ser: 1.76 mg/dL — ABNORMAL HIGH (ref 0.76–1.27)
GFR calc Af Amer: 45 mL/min/{1.73_m2} — ABNORMAL LOW (ref >59)
GFR calc non Af Amer: 39 mL/min/{1.73_m2} — ABNORMAL LOW (ref >59)
Glucose: 298 mg/dL — ABNORMAL HIGH (ref 65–99)
Potassium: 5.4 mmol/L — ABNORMAL HIGH (ref 3.5–5.2)
Sodium: 136 mmol/L (ref 134–144)

## 2016-08-06 LAB — HEPATIC FUNCTION PANEL
ALT: 35 IU/L (ref 0–44)
AST: 24 IU/L (ref 0–40)
Albumin: 3.8 g/dL (ref 3.6–4.8)
Alkaline Phosphatase: 94 IU/L (ref 39–117)
Bilirubin Total: 0.4 mg/dL (ref 0.0–1.2)
Bilirubin, Direct: 0.16 mg/dL (ref 0.00–0.40)
Total Protein: 6.6 g/dL (ref 6.0–8.5)

## 2016-08-06 LAB — CBC
Hematocrit: 37.6 % (ref 37.5–51.0)
Hemoglobin: 12.4 g/dL — ABNORMAL LOW (ref 13.0–17.7)
MCH: 30.2 pg (ref 26.6–33.0)
MCHC: 33 g/dL (ref 31.5–35.7)
MCV: 92 fL (ref 79–97)
Platelets: 242 10*3/uL (ref 150–379)
RBC: 4.11 x10E6/uL — ABNORMAL LOW (ref 4.14–5.80)
RDW: 15 % (ref 12.3–15.4)
WBC: 7.5 10*3/uL (ref 3.4–10.8)

## 2016-08-06 LAB — TSH: TSH: 2.34 u[IU]/mL (ref 0.450–4.500)

## 2016-08-06 NOTE — Addendum Note (Signed)
Addended by: Claude Manges on: 08/06/2016 01:56 PM   Modules accepted: Orders

## 2016-08-10 ENCOUNTER — Encounter: Payer: Self-pay | Admitting: *Deleted

## 2016-08-10 ENCOUNTER — Ambulatory Visit (INDEPENDENT_AMBULATORY_CARE_PROVIDER_SITE_OTHER): Payer: Medicare HMO | Admitting: Cardiology

## 2016-08-10 ENCOUNTER — Encounter: Payer: Self-pay | Admitting: Cardiology

## 2016-08-10 ENCOUNTER — Telehealth: Payer: Self-pay | Admitting: Cardiology

## 2016-08-10 ENCOUNTER — Other Ambulatory Visit: Payer: Self-pay | Admitting: Cardiology

## 2016-08-10 VITALS — BP 152/84 | HR 62 | Ht 66.0 in | Wt 200.4 lb

## 2016-08-10 DIAGNOSIS — I484 Atypical atrial flutter: Secondary | ICD-10-CM

## 2016-08-10 DIAGNOSIS — Z01812 Encounter for preprocedural laboratory examination: Secondary | ICD-10-CM

## 2016-08-10 NOTE — Progress Notes (Signed)
Electrophysiology Office Note   Date:  08/10/2016   ID:  Matthew [name], DOB 1946/01/29, MRN [id number]  PCP:  Elyn Peers, MD  Cardiologist:  Lovena Le Primary Electrophysiologist: [name], MD    Chief Complaint  Patient presents with  . New Patient (Initial Visit)    AFlutter/Discuss ablation     History of Present Illness: Matthew [name] is a 71 y.o. male who is being seen today for the evaluation of atrial flutter at the request of [name], MD. Presenting today for electrophysiology evaluation. history of CAD hx of CABG 2011, AFlutter ablated, recurrent AFlutter with atypical morphology >> EPS noted LA flutter his atrial flutter isthmus was blocked, initially on Tikosyn with prolongation of his QT eventually on amiodarone, DM. Complains of extreme weakness/fatigue, dizziness, swelling of LE, double vision on occasion.   he was put on the amiodarone he felt tired, poor, though with reduction of the dose he did feel well, his wife says actually felt pretty well for a while until he was restarted on the statin, this seems to have been the last catalyst/trigger to much of his symptomatolgy.   AFlutter Hx: Ablation of typical AFlutter 10/31/14 EPS 11/20/15:  LA flutter > DCCV AAD Hx: July 2017 started on amiodarone Hx of QT prolongation on Tikosyn  Today, he denies symptoms of palpitations, chest pain, shortness of breath, orthopnea, PND, lower extremity edema, claudication, dizziness, presyncope, syncope, bleeding, or neurologic sequela. The patient is tolerating medications without difficulties.  His main complaint is of severe fatigue. Feels like the amiodarone is the most significant injury factor to his fatigue.   Past Medical History:  Diagnosis Date  . Anemia   . Arthritis    "back, left ankle" (11/20/2015)  . CHF (congestive heart failure) ([place])   . Chronic lower back pain   . Coronary artery disease    a. 05/2009 CABG x 3: LIMA->LAD, VG->OM,  VG->PDA; b. Nuc 01/2014: inf-lat scar but no ischemia, EF 57%.  . Diabetic retinopathy (HCC)    mild- Dr. Herbert Deaner  . Glaucoma   . Hyperlipidemia    a. Intolerant of lipitor and vytorin.  . Migraine    "once or twice" (11/20/2015)  . Myocardial infarction [facility])    'saw evidence of it on an EKG done in 2005"  . Paroxysmal atrial flutter ([place])    a. 01/2014 s/p DCCV;  b. CHA2DS2VASc = 3-->Eliquis.  . Pneumonia 1960  . PTSD (post-traumatic stress disorder)    "X 2 yr post OHS" (10/31/2014)  . Sacral pain    "right"  . Sciatica of right side   . Testicular cancer ([place]) 1982   "heavy doses of radiation; it was stage 2"  . Type II diabetes mellitus ([place])   . Valvular heart disease    a. 01/2014 Echo: Ef 60-65%, no rwma, mild AI/MS, mod MR, mildly dil LA.   Past Surgical History:  Procedure Laterality Date  . ABDOMINAL EXPLORATION SURGERY  ~ 2005   "for hernia, but didn't have one"  . CARDIAC CATHETERIZATION  2011  . CARDIOVERSION N/A 02/19/2014   Procedure: CARDIOVERSION;  Surgeon: [name], MD;  Location: [facility] ENDOSCOPY;  Service: Cardiovascular;  Laterality: N/A;  . CARDIOVERSION N/A 10/28/2015   Procedure: CARDIOVERSION;  Surgeon: [name], MD;  Location: [facility] ENDOSCOPY;  Service: Cardiovascular;  Laterality: N/A;  . CARDIOVERSION  11/20/2015   "200 joules"  . CORONARY ARTERY BYPASS GRAFT  05/2009   LIMA-LAD, SVG-OM, SVG-PDA  06/20/09  . ELBOW FRACTURE SURGERY Left    broken ulna on left elbow-surgical repair  . ELECTROPHYSIOLOGIC STUDY N/A 10/31/2014   Procedure: A-Flutter;  Surgeon: [name], MD;  Location: [place] CV LAB;  Service: Cardiovascular;  Laterality: N/A;  . ELECTROPHYSIOLOGIC STUDY N/A 11/20/2015   Procedure: A-Flutter Ablation;  Surgeon: [name], MD;  Location: [place] CV LAB;  Service: Cardiovascular;  Laterality: N/A;  . FRACTURE SURGERY    . TESTICLE REMOVAL Right 1982  . TONSILLECTOMY  ~ 1967     Current Outpatient Prescriptions    Medication Sig Dispense Refill  . apixaban (ELIQUIS) 5 MG TABS tablet Take 1 tablet (5 mg total) by mouth 2 (two) times daily. 180 tablet 3  . BD PEN NEEDLE NANO U/F 32G X 4 MM MISC Use 3-4 per day to inject insulin 200 each 1  . bimatoprost (LUMIGAN) 0.01 % SOLN Place 1 drop into the left eye at bedtime.    . Coenzyme Q10 (CO Q 10) 100 MG CAPS Take 1 capsule by mouth daily. (Patient taking differently: Take 100 mg by mouth daily. ) 90 capsule 1  . furosemide (LASIX) 40 MG tablet Take 40 mg by mouth. Once every two weeks    . glucose blood (ONETOUCH VERIO) test strip Use as instructed to check blood sugar 2 times per day dx code E11.59 200 each 1  . insulin aspart (NOVOLOG) 100 UNIT/ML injection Inject 12-15 units in the skin 3 times daily 30 mL 5  . insulin NPH Human (NOVOLIN N) 100 UNIT/ML injection Inject 26 units in the skin daily 10 mL 11  . Insulin Syringe-Needle U-100 (INSULIN SYRINGE 1CC/31GX5/16") 31G X 5/16" 1 ML MISC Use to inject insulin 4 times per day. 200 each 2  . lisinopril (PRINIVIL,ZESTRIL) 5 MG tablet TAKE 1 TABLET (5 mg) BY MOUTH EVERY DAY 90 tablet 2  . metoprolol (LOPRESSOR) 50 MG tablet Take 0.5 tablets (25 mg total) by mouth 2 (two) times daily. (Patient taking differently: Take 50 mg by mouth 2 (two) times daily. ) 90 tablet 3  . ONETOUCH DELICA LANCETS 33A MISC Use to check blood sugar 2 times per day dx code E11.59 200 each 1   No current facility-administered medications for this visit.     Allergies:   Actos [pioglitazone]; Bee venom; Codeine; Statins; Glucophage [metformin hcl]; and Raspberry   Social History:  The patient  reports that he has quit smoking. His smoking use included Cigarettes. He smoked 0.00 packs per day for 0.00 years. He has never used smokeless tobacco. He reports that he drinks about 1.8 oz of alcohol per week . He reports that he does not use drugs.   Family History:  The patient's family history includes Diabetes in his father, mother, and  sister; Heart attack in his father; Heart disease in his father; Hypertension in his father.    ROS:  Please see the history of present illness.   Otherwise, review of systems is positive for Weight change, fatigue, leg swelling, cough, snoring, back pain, dizziness, balance problems.   All other systems are reviewed and negative.    PHYSICAL EXAM: VS:  BP (!) 152/84   Pulse 62   Ht 5\' 6"  (1.676 m)   Wt 200 lb 6.4 oz (90.9 kg)   BMI 32.35 kg/m  , BMI Body mass index is 32.35 kg/m. GEN: Well nourished, well developed, in no acute distress  HEENT: normal  Neck: no JVD, carotid bruits, or  masses Cardiac: RRR; no murmurs, rubs, or gallops,no edema  Respiratory:  clear to auscultation bilaterally, normal work of breathing GI: soft, nontender, nondistended, + BS MS: no deformity or atrophy  Skin: warm and dry Neuro:  Strength and sensation are intact Psych: euthymic mood, full affect  EKG:  EKG is not ordered today. Personal review of the ekg ordered 4/11/18shows sinus rhythm, RBBB, inferior infarct (old)  Recent Labs: 11/15/2015: Hemoglobin 12.3 11/22/2015: Magnesium 2.3 08/05/2016: ALT 35; BUN 32; Creatinine, Ser 1.76; Platelets 242; Potassium 5.4; Sodium 136; TSH 2.340    Lipid Panel     Component Value Date/Time   CHOL 170 03/26/2016 0845   CHOL 216 (H) 06/21/2013 1405   TRIG 64.0 03/26/2016 0845   TRIG 132 06/21/2013 1405   HDL 48.90 03/26/2016 0845   HDL 54 06/21/2013 1405   CHOLHDL 3 03/26/2016 0845   VLDL 12.8 03/26/2016 0845   LDLCALC 109 (H) 03/26/2016 0845   LDLCALC 136 (H) 06/21/2013 1405     Wt Readings from Last 3 Encounters:  08/10/16 200 lb 6.4 oz (90.9 kg)  08/05/16 203 lb (92.1 kg)  04/01/16 198 lb (89.8 kg)      Other studies Reviewed: Additional studies/ records that were reviewed today include: TTE  2015 Review of the above records today demonstrates:  - Left ventricle: The cavity size was normal. There was mild focal basal hypertrophy of the  septum. Systolic function was normal. The estimated ejection fraction was in the range of 60% to 65%. Wall motion was normal; there were no regional wall motion abnormalities. - Aortic valve: Valve mobility was restricted. There was mild regurgitation. Mean gradient (S): 7 mm Hg. Peak gradient (S): 12 mm Hg. Valve area (VTI): 2.03 cm^2. Valve area (Vmean): 2.02 cm^2. - Mitral valve: Calcified annulus. Mildly thickened leaflets . The findings are consistent with mild stenosis. There was moderate regurgitation. Valve area by continuity equation (using LVOT flow): 2.29 cm^2. - Left atrium: The atrium was mildly dilated. - Right ventricle: Systolic function was mildly reduced. - Right atrium: The atrium was mildly dilated.   ASSESSMENT AND PLAN:  1.  Atypical atrial flutter: Currently on Eliquis. I discussed the risks and benefits of ablation versus medical management with amiodarone. Risks include bleeding, tamponade, heart block, stroke, and damage to surrounding organs among others. I also discussed with him that he would need a pulmonary vein isolation as well. If we cannot start the atrial flutter, he will only have a pulmonary vein isolation. He understands the risks of the procedure and has agreed to ablation. We'll hold his amiodarone as this could make it very difficult to induce atrial flutter.  This patients CHA2DS2-VASc Score and unadjusted Ischemic Stroke Rate (% per year) is equal to 4.8 % stroke rate/year from a score of 4  Above score calculated as 1 point each if present [CHF, HTN, DM, Vascular=MI/PAD/Aortic Plaque, Age if 65-74, or Male] Above score calculated as 2 points each if present [Age > 75, or Stroke/TIA/TE]  2. Coronary artery disease: no current angina  3. Hyperlipidemia: referral to Rx for PCSK9    Current medicines are reviewed at length with the patient today.   The patient does not have concerns regarding his medicines.  The following  changes were made today:  Stop amiodarone  Labs/ tests ordered today include:  Orders Placed This Encounter  Procedures  . CT CARDIAC MORPH/PULM VEIN W/CM&W/O CA SCORE  . CT CORONARY FRACTIONAL FLOW RESERVE DATA PREP  . CBC  w/Diff  . Basic Metabolic Panel (BMET)     Disposition:   FU with Will Camnitz 3 months  Signed, Will Meredith Leeds, MD  08/10/2016 9:46 AM     [facility] [address] [place] [address] [phone] (office) [phone] (fax)

## 2016-08-10 NOTE — Telephone Encounter (Signed)
New Message    Pt c/o medication issue:  1. Name of Medication: furosemide (LASIX) 40 MG tablet  2. How are you currently taking this medication (dosage and times per day)? Unsure  3. Are you having a reaction (difficulty breathing--STAT)? No  4. What is your medication issue? Per pt unsure how often he should be taking lasix. Requesting call back from nurse

## 2016-08-10 NOTE — Telephone Encounter (Signed)
Pt instructed to resume as he was taking it before appt w/ [name], PA last week. (he was taking it every 2 weeks) Patient verbalized understanding and agreeable to plan.

## 2016-08-10 NOTE — Patient Instructions (Addendum)
Medication Instructions:   Your physician has recommended you make the following change in your medication:  1) STOP Amidoarone  --- If you need a refill on your cardiac medications before your next appointment, please call your pharmacy. ---  Labwork:  None ordered  Testing/Procedures: Your physician has requested that you have cardiac CT. Cardiac computed tomography (CT) is a painless test that uses an x-ray machine to take clear, detailed pictures of your heart. For further information please visit [email or web address].    The office will call you to arrange this testing   Your physician has recommended that you have an ablation. Catheter ablation is a medical procedure used to treat some cardiac arrhythmias (irregular heartbeats). During catheter ablation, a long, thin, flexible tube is put into a blood vessel in your groin (upper thigh), or neck. This tube is called an ablation catheter. It is then guided to your heart through the blood vessel. Radio frequency waves destroy small areas of heart tissue where abnormal heartbeats may cause an arrhythmia to start. Please see the instruction sheet given to you today.   Follow-Up:  Your physician recommends that you schedule a follow-up appointment between: 5/10-5/23 with Dr. Curt Bears for H&P and pre procedure blood work.   Your physician recommends that you schedule a follow-up appointment in: 4-5 weeks, after your procedure on 09/17/16, with Dr. Curt Bears.   Thank you for choosing [facility]!!   Trinidad Curet, RN [phone]   Any Other Special Instructions Will Be Listed Below (If Applicable).   Cardiac Ablation Cardiac ablation is a procedure to disable (ablate) a small amount of heart tissue in very specific places. The heart has many electrical connections. Sometimes these connections are abnormal and can cause the heart to beat very fast or irregularly. Ablating some of the problem areas can improve the heart rhythm or  return it to normal. Ablation may be done for people who:  Have Wolff-Parkinson-White syndrome.  Have fast heart rhythms (tachycardia).  Have taken medicines for an abnormal heart rhythm (arrhythmia) that were not effective or caused side effects.  Have a high-risk heartbeat that may be life-threatening. During the procedure, a small incision is made in the neck or the groin, and a long, thin, flexible tube (catheter) is inserted into the incision and moved to the heart. Small devices (electrodes) on the tip of the catheter will send out electrical currents. A type of X-ray (fluoroscopy) will be used to help guide the catheter and to provide images of the heart. Tell a health care provider about:  Any allergies you have.  All medicines you are taking, including vitamins, herbs, eye drops, creams, and over-the-counter medicines.  Any problems you or family members have had with anesthetic medicines.  Any blood disorders you have.  Any surgeries you have had.  Any medical conditions you have, such as kidney failure.  Whether you are pregnant or may be pregnant. What are the risks? Generally, this is a safe procedure. However, problems may occur, including:  Infection.  Bruising and bleeding at the catheter insertion site.  Bleeding into the chest, especially into the sac that surrounds the heart. This is a serious complication.  Stroke or blood clots.  Damage to other structures or organs.  Allergic reaction to medicines or dyes.  Need for a permanent pacemaker if the normal electrical system is damaged. A pacemaker is a small computer that sends electrical signals to the heart and helps your heart beat normally.  The procedure not being fully  effective. This may not be recognized until months later. Repeat ablation procedures are sometimes required. What happens before the procedure?  Follow instructions from your health care provider about eating or drinking  restrictions.  Ask your health care provider about:  Changing or stopping your regular medicines. This is especially important if you are taking diabetes medicines or blood thinners.  Taking medicines such as aspirin and ibuprofen. These medicines can thin your blood. Do not take these medicines before your procedure if your health care provider instructs you not to.  Plan to have someone take you home from the hospital or clinic.  If you will be going home right after the procedure, plan to have someone with you for 24 hours. What happens during the procedure?  To lower your risk of infection:  Your health care team will wash or sanitize their hands.  Your skin will be washed with soap.  Hair may be removed from the incision area.  An IV tube will be inserted into one of your veins.  You will be given a medicine to help you relax (sedative).  The skin on your neck or groin will be numbed.  An incision will be made in your neck or your groin.  A needle will be inserted through the incision and into a large vein in your neck or groin.  A catheter will be inserted into the needle and moved to your heart.  Dye may be injected through the catheter to help your surgeon see the area of the heart that needs treatment.  Electrical currents will be sent from the catheter to ablate heart tissue in desired areas. There are three types of energy that may be used to ablate heart tissue:  Heat (radiofrequency energy).  Laser energy.  Extreme cold (cryoablation).  When the necessary tissue has been ablated, the catheter will be removed.  Pressure will be held on the catheter insertion area to prevent excessive bleeding.  A bandage (dressing) will be placed over the catheter insertion area. The procedure may vary among health care providers and hospitals. What happens after the procedure?  Your blood pressure, heart rate, breathing rate, and blood oxygen level will be monitored  until the medicines you were given have worn off.  Your catheter insertion area will be monitored for bleeding. You will need to lie still for a few hours to ensure that you do not bleed from the catheter insertion area.  Do not drive for 24 hours or as long as directed by your health care provider. Summary  Cardiac ablation is a procedure to disable (ablate) a small amount of heart tissue in very specific places. Ablating some of the problem areas can improve the heart rhythm or return it to normal.  During the procedure, electrical currents will be sent from the catheter to ablate heart tissue in desired areas. This information is not intended to replace advice given to you by your health care provider. Make sure you discuss any questions you have with your health care provider. Document Released: 08/30/2008 Document Revised: 03/02/2016 Document Reviewed: 03/02/2016 Elsevier Interactive Patient Education  2017 [employer].

## 2016-08-11 NOTE — Telephone Encounter (Signed)
  08/06/2016 02:25 PM Phone (Outgoing) Aloia, Mercy L (Self) [phone] (H)   Left Message - Called pt and lmsg for him to CB to get scheduled for an Echo and Carotid .    By Verdene Rio

## 2016-08-14 ENCOUNTER — Other Ambulatory Visit: Payer: Medicare HMO

## 2016-08-14 DIAGNOSIS — Z79899 Other long term (current) drug therapy: Secondary | ICD-10-CM

## 2016-08-14 LAB — BASIC METABOLIC PANEL
BUN/Creatinine Ratio: 18 (ref 10–24)
BUN: 21 mg/dL (ref 8–27)
CO2: 25 mmol/L (ref 18–29)
Calcium: 9 mg/dL (ref 8.6–10.2)
Chloride: 98 mmol/L (ref 96–106)
Creatinine, Ser: 1.16 mg/dL (ref 0.76–1.27)
GFR calc Af Amer: 74 mL/min/{1.73_m2} (ref >59)
GFR calc non Af Amer: 64 mL/min/{1.73_m2} (ref >59)
Glucose: 181 mg/dL — ABNORMAL HIGH (ref 65–99)
Potassium: 5.3 mmol/L — ABNORMAL HIGH (ref 3.5–5.2)
Sodium: 138 mmol/L (ref 134–144)

## 2016-08-19 ENCOUNTER — Encounter: Payer: Self-pay | Admitting: Cardiology

## 2016-08-20 ENCOUNTER — Ambulatory Visit (HOSPITAL_COMMUNITY)
Admission: RE | Admit: 2016-08-20 | Discharge: 2016-08-20 | Disposition: A | Discharge status: Home / Self Care | Payer: Medicare HMO | Source: Ambulatory Visit | Attending: Cardiology | Admitting: Cardiology

## 2016-08-20 DIAGNOSIS — R002 Palpitations: Secondary | ICD-10-CM | POA: Diagnosis not present

## 2016-08-20 DIAGNOSIS — R42 Dizziness and giddiness: Secondary | ICD-10-CM

## 2016-08-20 DIAGNOSIS — I6523 Occlusion and stenosis of bilateral carotid arteries: Secondary | ICD-10-CM | POA: Insufficient documentation

## 2016-08-20 DIAGNOSIS — I2510 Atherosclerotic heart disease of native coronary artery without angina pectoris: Secondary | ICD-10-CM | POA: Insufficient documentation

## 2016-08-20 DIAGNOSIS — Z951 Presence of aortocoronary bypass graft: Secondary | ICD-10-CM | POA: Diagnosis not present

## 2016-08-20 DIAGNOSIS — E119 Type 2 diabetes mellitus without complications: Secondary | ICD-10-CM | POA: Diagnosis not present

## 2016-08-20 DIAGNOSIS — Z87891 Personal history of nicotine dependence: Secondary | ICD-10-CM | POA: Diagnosis not present

## 2016-08-20 DIAGNOSIS — E784 Other hyperlipidemia: Secondary | ICD-10-CM | POA: Diagnosis not present

## 2016-08-21 ENCOUNTER — Other Ambulatory Visit: Payer: Self-pay

## 2016-08-21 ENCOUNTER — Ambulatory Visit (HOSPITAL_COMMUNITY): Payer: Medicare HMO | Attending: Cardiology

## 2016-08-21 DIAGNOSIS — Z87891 Personal history of nicotine dependence: Secondary | ICD-10-CM | POA: Insufficient documentation

## 2016-08-21 DIAGNOSIS — E784 Other hyperlipidemia: Secondary | ICD-10-CM | POA: Diagnosis not present

## 2016-08-21 DIAGNOSIS — I340 Nonrheumatic mitral (valve) insufficiency: Secondary | ICD-10-CM | POA: Insufficient documentation

## 2016-08-21 DIAGNOSIS — E119 Type 2 diabetes mellitus without complications: Secondary | ICD-10-CM | POA: Insufficient documentation

## 2016-08-21 DIAGNOSIS — I517 Cardiomegaly: Secondary | ICD-10-CM | POA: Diagnosis not present

## 2016-08-21 DIAGNOSIS — R002 Palpitations: Secondary | ICD-10-CM | POA: Insufficient documentation

## 2016-08-26 ENCOUNTER — Telehealth: Payer: Self-pay | Admitting: *Deleted

## 2016-08-26 NOTE — Telephone Encounter (Signed)
-----   Message from [place], [place] sent at 08/24/2016  5:56 PM EDT ----- Please let the patient know his heart muscle is strong, no worrisome heart murmurs, his heart muscle is a little thickened, likely from high BP, his BP was elevated at our visit, if he can start monitoring his BP daily or a few days a week at least and bring his home numbers to his visit with Dr. Curt Bears in a couple weeks for review.  Is he feeling any better off the statin (Crestor)?  Thanks [employer]

## 2016-08-26 NOTE — Telephone Encounter (Signed)
SPOKE TO PT ABOUT RESULTS AND DAILY BLOOD PRESSURE CHECKS WITH KEEPING  A RECORD  ALSO CONFIRMING. APPOINTMENT DATE AND TIME WITH DR CAMNITZ.  PT FEELS GREAT SINCE STOPPING TAKING CRESTOR

## 2016-09-03 ENCOUNTER — Other Ambulatory Visit: Payer: Medicare HMO

## 2016-09-09 ENCOUNTER — Other Ambulatory Visit: Payer: Self-pay

## 2016-09-09 ENCOUNTER — Other Ambulatory Visit: Payer: Self-pay | Admitting: Cardiology

## 2016-09-09 ENCOUNTER — Encounter: Payer: Self-pay | Admitting: Cardiology

## 2016-09-09 ENCOUNTER — Ambulatory Visit (INDEPENDENT_AMBULATORY_CARE_PROVIDER_SITE_OTHER): Payer: Medicare HMO | Admitting: Cardiology

## 2016-09-09 VITALS — BP 154/70 | HR 60 | Ht 66.0 in | Wt 197.4 lb

## 2016-09-09 DIAGNOSIS — I484 Atypical atrial flutter: Secondary | ICD-10-CM

## 2016-09-09 DIAGNOSIS — Z01812 Encounter for preprocedural laboratory examination: Secondary | ICD-10-CM

## 2016-09-09 LAB — CBC WITH DIFFERENTIAL/PLATELET
Basophils Absolute: 0 10*3/uL (ref 0.0–0.2)
Basos: 0 %
EOS (ABSOLUTE): 0.3 10*3/uL (ref 0.0–0.4)
Eos: 4 %
Hematocrit: 36.4 % — ABNORMAL LOW (ref 37.5–51.0)
Hemoglobin: 12.2 g/dL — ABNORMAL LOW (ref 13.0–17.7)
Immature Grans (Abs): 0 10*3/uL (ref 0.0–0.1)
Immature Granulocytes: 0 %
Lymphocytes Absolute: 1.3 10*3/uL (ref 0.7–3.1)
Lymphs: 20 %
MCH: 30.4 pg (ref 26.6–33.0)
MCHC: 33.5 g/dL (ref 31.5–35.7)
MCV: 91 fL (ref 79–97)
Monocytes Absolute: 0.5 10*3/uL (ref 0.1–0.9)
Monocytes: 8 %
Neutrophils Absolute: 4.4 10*3/uL (ref 1.4–7.0)
Neutrophils: 68 %
Platelets: 236 10*3/uL (ref 150–379)
RBC: 4.01 x10E6/uL — ABNORMAL LOW (ref 4.14–5.80)
RDW: 14.2 % (ref 12.3–15.4)
WBC: 6.5 10*3/uL (ref 3.4–10.8)

## 2016-09-09 LAB — BASIC METABOLIC PANEL
BUN/Creatinine Ratio: 24 (ref 10–24)
BUN: 26 mg/dL (ref 8–27)
CO2: 23 mmol/L (ref 18–29)
Calcium: 9.2 mg/dL (ref 8.6–10.2)
Chloride: 98 mmol/L (ref 96–106)
Creatinine, Ser: 1.09 mg/dL (ref 0.76–1.27)
GFR calc Af Amer: 80 mL/min/{1.73_m2} (ref >59)
GFR calc non Af Amer: 69 mL/min/{1.73_m2} (ref >59)
Glucose: 234 mg/dL — ABNORMAL HIGH (ref 65–99)
Potassium: 5 mmol/L (ref 3.5–5.2)
Sodium: 137 mmol/L (ref 134–144)

## 2016-09-09 NOTE — Patient Instructions (Signed)
Medication Instructions:    Your physician recommends that you continue on your current medications as directed. Please refer to the Current Medication list given to you today.  Labwork:  Pre procedure lab work today: BMET & CBC w/ diff  Testing/Procedures:  None ordered  Follow-Up:  Your physician recommends that you schedule a follow-up appointment in: 4 weeks, after your procedure on 09/17/16, with Roderic Palau in the AFib clinic.   Your physician recommends that you schedule a follow-up appointment in: 3 months, after your procedure on 09/17/16 with Dr. Curt Bears.  - If you need a refill on your cardiac medications before your next appointment, please call your pharmacy.   Any Other Special Instructions Will Be Listed Below (If Applicable).   Thank you for choosing [facility]!!   Trinidad Curet, RN [phone]

## 2016-09-09 NOTE — Progress Notes (Signed)
Electrophysiology Office Note   Date:  09/09/2016   ID:  Matthew [name], DOB 09-16-1945, MRN [id number]  PCP:  [name], MD  Cardiologist:  Lovena Le Primary Electrophysiologist: [name], MD    Chief Complaint  Patient presents with  . Follow-up    Atypical AFlutter     History of Present Illness: Matthew [name] is a 71 y.o. male who is being seen today for the evaluation of atrial flutter at the request of [name], MD. Presenting today for electrophysiology evaluation. history of CAD hx of CABG 2011, AFlutter ablated, recurrent AFlutter with atypical morphology >> EPS noted LA flutter his atrial flutter isthmus was blocked, initially on Tikosyn with prolongation of his QT eventually on amiodarone, DM. Complains of extreme weakness/fatigue, dizziness, swelling of LE, double vision on occasion.   he was put on the amiodarone he felt tired, poor, though with reduction of the dose he did feel well, his wife says actually felt pretty well for a while until he was restarted on the statin, this seems to have been the last catalyst/trigger to much of his symptomatolgy.    AFlutter Hx: Ablation of typical AFlutter 10/31/14 EPS 11/20/15:  LA flutter > DCCV AAD Hx: July 2017 started on amiodarone Hx of QT prolongation on Tikosyn  Today, he feels well without major complaint. He is able to do all of his daily activities without issue. He is planning to go for a motorcycle trip 8 days after the ablation. He does not have any chest pain, shortness of breath, PND, orthopnea, palpitations, syncope or presyncope. He has not had any further symptoms that are similar to his episode of atrial flutter.   Past Medical History:  Diagnosis Date  . Anemia   . Arthritis    "back, left ankle" (11/20/2015)  . CHF (congestive heart failure) ([place])   . Chronic lower back pain   . Coronary artery disease    a. 05/2009 CABG x 3: LIMA->LAD, VG->OM, VG->PDA; b. Nuc 01/2014: inf-lat  scar but no ischemia, EF 57%.  . Diabetic retinopathy ([facility])    mild- Dr. Herbert Deaner  . Glaucoma   . Hyperlipidemia    a. Intolerant of lipitor and vytorin.  . Migraine    "once or twice" (11/20/2015)  . Myocardial infarction [facility])    'saw evidence of it on an EKG done in 2005"  . Paroxysmal atrial flutter ([place])    a. 01/2014 s/p DCCV;  b. CHA2DS2VASc = 3-->Eliquis.  . Pneumonia 1960  . PTSD (post-traumatic stress disorder)    "X 2 yr post OHS" (10/31/2014)  . Sacral pain    "right"  . Sciatica of right side   . Testicular cancer ([place]) 1982   "heavy doses of radiation; it was stage 2"  . Type II diabetes mellitus ([place])   . Valvular heart disease    a. 01/2014 Echo: Ef 60-65%, no rwma, mild AI/MS, mod MR, mildly dil LA.   Past Surgical History:  Procedure Laterality Date  . ABDOMINAL EXPLORATION SURGERY  ~ 2005   "for hernia, but didn't have one"  . CARDIAC CATHETERIZATION  2011  . CARDIOVERSION N/A 02/19/2014   Procedure: CARDIOVERSION;  Surgeon: [name], MD;  Location: [facility] ENDOSCOPY;  Service: Cardiovascular;  Laterality: N/A;  . CARDIOVERSION N/A 10/28/2015   Procedure: CARDIOVERSION;  Surgeon: [name], MD;  Location: [facility] ENDOSCOPY;  Service: Cardiovascular;  Laterality: N/A;  . CARDIOVERSION  11/20/2015   "200 joules"  .  CORONARY ARTERY BYPASS GRAFT  05/2009   LIMA-LAD, SVG-OM, SVG-PDA 06/20/09  . ELBOW FRACTURE SURGERY Left    broken ulna on left elbow-surgical repair  . ELECTROPHYSIOLOGIC STUDY N/A 10/31/2014   Procedure: A-Flutter;  Surgeon: [name], MD;  Location: [place] CV LAB;  Service: Cardiovascular;  Laterality: N/A;  . ELECTROPHYSIOLOGIC STUDY N/A 11/20/2015   Procedure: A-Flutter Ablation;  Surgeon: [name], MD;  Location: [place] CV LAB;  Service: Cardiovascular;  Laterality: N/A;  . FRACTURE SURGERY    . TESTICLE REMOVAL Right 1982  . TONSILLECTOMY  ~ 1967     Current Outpatient Prescriptions  Medication Sig Dispense Refill    . apixaban (ELIQUIS) 5 MG TABS tablet Take 1 tablet (5 mg total) by mouth 2 (two) times daily. 180 tablet 3  . BD PEN NEEDLE NANO U/F 32G X 4 MM MISC Use 3-4 per day to inject insulin 200 each 1  . bimatoprost (LUMIGAN) 0.01 % SOLN Place 1 drop into the left eye at bedtime.    . Coenzyme Q10 (CO Q 10) 100 MG CAPS Take 1 capsule by mouth daily. (Patient taking differently: Take 100 mg by mouth daily. ) 90 capsule 1  . furosemide (LASIX) 40 MG tablet Take 40 mg by mouth. Once every two weeks    . insulin aspart (NOVOLOG) 100 UNIT/ML injection Inject 12-15 units in the skin 3 times daily 30 mL 5  . insulin NPH Human (NOVOLIN N) 100 UNIT/ML injection Inject 26 units in the skin daily 10 mL 11  . lisinopril (PRINIVIL,ZESTRIL) 5 MG tablet TAKE 1 TABLET (5 mg) BY MOUTH EVERY DAY 90 tablet 2  . metoprolol (LOPRESSOR) 50 MG tablet Take 0.5 tablets (25 mg total) by mouth 2 (two) times daily. (Patient taking differently: Take 50 mg by mouth 2 (two) times daily. ) 90 tablet 3   No current facility-administered medications for this visit.     Allergies:   Actos [pioglitazone]; Bee venom; Codeine; Statins; Glucophage [metformin hcl]; and Raspberry   Social History:  The patient  reports that he has quit smoking. His smoking use included Cigarettes. He smoked 0.00 packs per day for 0.00 years. He has never used smokeless tobacco. He reports that he drinks about 1.8 oz of alcohol per week . He reports that he does not use drugs.   Family History:  The patient's family history includes Diabetes in his father, mother, and sister; Heart attack in his father; Heart disease in his father; Hypertension in his father.    ROS:  Please see the history of present illness.   Otherwise, review of systems is positive for Leg swelling, snoring, back pain, balance problems.   All other systems are reviewed and negative.     PHYSICAL EXAM: VS:  BP (!) 154/70   Pulse 60   Ht 5\' 6"  (1.676 m)   Wt 197 lb 6.4 oz (89.5 kg)    BMI 31.86 kg/m  , BMI Body mass index is 31.86 kg/m. GEN: Well nourished, well developed, in no acute distress  HEENT: normal  Neck: no JVD, carotid bruits, or masses Cardiac: RRR; no murmurs, rubs, or gallops,no edema  Respiratory:  clear to auscultation bilaterally, normal work of breathing GI: soft, nontender, nondistended, + BS MS: no deformity or atrophy  Skin: warm and dry Neuro:  Strength and sensation are intact Psych: euthymic mood, full affect   EKG:  EKG is not ordered today. Personal review of the ekg ordered 08/05/16 shows  sinus rhythm, RBBB, IMI old   Recent Labs: 11/15/2015: Hemoglobin 12.3 11/22/2015: Magnesium 2.3 08/05/2016: ALT 35; Platelets 242; TSH 2.340 08/14/2016: BUN 21; Creatinine, Ser 1.16; Potassium 5.3; Sodium 138    Lipid Panel     Component Value Date/Time   CHOL 170 03/26/2016 0845   CHOL 216 (H) 06/21/2013 1405   TRIG 64.0 03/26/2016 0845   TRIG 132 06/21/2013 1405   HDL 48.90 03/26/2016 0845   HDL 54 06/21/2013 1405   CHOLHDL 3 03/26/2016 0845   VLDL 12.8 03/26/2016 0845   LDLCALC 109 (H) 03/26/2016 0845   LDLCALC 136 (H) 06/21/2013 1405     Wt Readings from Last 3 Encounters:  09/09/16 197 lb 6.4 oz (89.5 kg)  08/10/16 200 lb 6.4 oz (90.9 kg)  08/05/16 203 lb (92.1 kg)      Other studies Reviewed: Additional studies/ records that were reviewed today include: TTE  2015 Review of the above records today demonstrates:  - Left ventricle: The cavity size was normal. There was mild focal basal hypertrophy of the septum. Systolic function was normal. The estimated ejection fraction was in the range of 60% to 65%. Wall motion was normal; there were no regional wall motion abnormalities. - Aortic valve: Valve mobility was restricted. There was mild regurgitation. Mean gradient (S): 7 mm Hg. Peak gradient (S): 12 mm Hg. Valve area (VTI): 2.03 cm^2. Valve area (Vmean): 2.02 cm^2. - Mitral valve: Calcified annulus. Mildly  thickened leaflets . The findings are consistent with mild stenosis. There was moderate regurgitation. Valve area by continuity equation (using LVOT flow): 2.29 cm^2. - Left atrium: The atrium was mildly dilated. - Right ventricle: Systolic function was mildly reduced. - Right atrium: The atrium was mildly dilated.   ASSESSMENT AND PLAN:  1.  Atypical atrial flutter: On Eliquis. I discussed with him options of ablation for atrial flutter further. Risks and benefits were discussed. Risks include bleeding, tamponade, heart block, stroke, and damage to surrounding organs. He understands these risks and has agreed to the procedure. All questions were addressed today. He will get a CT scan tomorrow with labs performed today.  This patients CHA2DS2-VASc Score and unadjusted Ischemic Stroke Rate (% per year) is equal to 4.8 % stroke rate/year from a score of 4  Above score calculated as 1 point each if present [CHF, HTN, DM, Vascular=MI/PAD/Aortic Plaque, Age if 65-74, or Male] Above score calculated as 2 points each if present [Age > 75, or Stroke/TIA/TE]  2. Coronary artery disease: Doing well with no current angina.  3. Hyperlipidemia: Will likely need referral to pharmacy for further management    Current medicines are reviewed at length with the patient today.   The patient does not have concerns regarding his medicines.  The following changes were made today:  none  Labs/ tests ordered today include: cbc, bmp No orders of the defined types were placed in this encounter.    Disposition:   FU with Will Camnitz 3 months  Signed, Will Meredith Leeds, MD  09/09/2016 11:04 AM     [facility] [address] [place] [address] [phone] (office) [phone] (fax)

## 2016-09-10 ENCOUNTER — Ambulatory Visit (HOSPITAL_COMMUNITY): Admission: RE | Admit: 2016-09-10 | Payer: Medicare HMO | Source: Ambulatory Visit

## 2016-09-10 ENCOUNTER — Ambulatory Visit (HOSPITAL_COMMUNITY)
Admission: RE | Admit: 2016-09-10 | Discharge: 2016-09-10 | Disposition: A | Discharge status: Home / Self Care | Payer: Medicare HMO | Source: Ambulatory Visit | Attending: Cardiology | Admitting: Cardiology

## 2016-09-10 DIAGNOSIS — I700 Atherosclerosis of aorta: Secondary | ICD-10-CM | POA: Insufficient documentation

## 2016-09-10 DIAGNOSIS — R918 Other nonspecific abnormal finding of lung field: Secondary | ICD-10-CM | POA: Insufficient documentation

## 2016-09-10 DIAGNOSIS — I484 Atypical atrial flutter: Secondary | ICD-10-CM | POA: Diagnosis present

## 2016-09-10 DIAGNOSIS — I4891 Unspecified atrial fibrillation: Secondary | ICD-10-CM | POA: Diagnosis not present

## 2016-09-10 MED ORDER — IOPAMIDOL (ISOVUE-370) INJECTION 76%
INTRAVENOUS | Status: AC
  Administered 2016-09-10: 80 mL
  Filled 2016-09-10: qty 100

## 2016-09-10 MED ORDER — NITROGLYCERIN 0.4 MG SL SUBL
SUBLINGUAL_TABLET | SUBLINGUAL | Status: AC
  Filled 2016-09-10: qty 1

## 2016-09-16 ENCOUNTER — Telehealth: Payer: Self-pay | Admitting: Cardiology

## 2016-09-16 NOTE — Telephone Encounter (Signed)
Pt tells me that he found his instruction sheet and knows med instructions for tomorrow.  He apologized for bothering me.  Informed pt he was never a bother and call me anytime he had a question. He thanks me for returning his call.

## 2016-09-16 NOTE — Telephone Encounter (Signed)
New message      Pt is having a procedure tomorrow.  Talk to a nurse to go over medications and insulin dosage.  Please call

## 2016-09-17 ENCOUNTER — Ambulatory Visit (HOSPITAL_COMMUNITY)
Admission: RE | Admit: 2016-09-17 | Discharge: 2016-09-18 | Disposition: A | Discharge status: Home / Self Care | Payer: Medicare HMO | Source: Ambulatory Visit | Attending: Cardiology | Admitting: Cardiology

## 2016-09-17 ENCOUNTER — Ambulatory Visit (HOSPITAL_COMMUNITY): Payer: Medicare HMO | Admitting: Anesthesiology

## 2016-09-17 ENCOUNTER — Encounter (HOSPITAL_COMMUNITY)
Admission: RE | Disposition: A | Discharge status: Home / Self Care | Payer: Self-pay | Source: Ambulatory Visit | Attending: Cardiology

## 2016-09-17 ENCOUNTER — Encounter (HOSPITAL_COMMUNITY): Payer: Self-pay | Admitting: General Practice

## 2016-09-17 DIAGNOSIS — R69 Illness, unspecified: Secondary | ICD-10-CM | POA: Diagnosis not present

## 2016-09-17 DIAGNOSIS — Z87891 Personal history of nicotine dependence: Secondary | ICD-10-CM | POA: Insufficient documentation

## 2016-09-17 DIAGNOSIS — I252 Old myocardial infarction: Secondary | ICD-10-CM | POA: Insufficient documentation

## 2016-09-17 DIAGNOSIS — I4891 Unspecified atrial fibrillation: Secondary | ICD-10-CM | POA: Diagnosis not present

## 2016-09-17 DIAGNOSIS — F4310 Post-traumatic stress disorder, unspecified: Secondary | ICD-10-CM | POA: Insufficient documentation

## 2016-09-17 DIAGNOSIS — I484 Atypical atrial flutter: Secondary | ICD-10-CM | POA: Diagnosis not present

## 2016-09-17 DIAGNOSIS — I4892 Unspecified atrial flutter: Secondary | ICD-10-CM | POA: Diagnosis present

## 2016-09-17 DIAGNOSIS — Z794 Long term (current) use of insulin: Secondary | ICD-10-CM | POA: Diagnosis not present

## 2016-09-17 DIAGNOSIS — E785 Hyperlipidemia, unspecified: Secondary | ICD-10-CM | POA: Diagnosis not present

## 2016-09-17 DIAGNOSIS — I5022 Chronic systolic (congestive) heart failure: Secondary | ICD-10-CM | POA: Insufficient documentation

## 2016-09-17 DIAGNOSIS — F419 Anxiety disorder, unspecified: Secondary | ICD-10-CM | POA: Insufficient documentation

## 2016-09-17 DIAGNOSIS — E119 Type 2 diabetes mellitus without complications: Secondary | ICD-10-CM | POA: Insufficient documentation

## 2016-09-17 DIAGNOSIS — I2510 Atherosclerotic heart disease of native coronary artery without angina pectoris: Secondary | ICD-10-CM | POA: Diagnosis not present

## 2016-09-17 DIAGNOSIS — M1990 Unspecified osteoarthritis, unspecified site: Secondary | ICD-10-CM | POA: Diagnosis not present

## 2016-09-17 DIAGNOSIS — D649 Anemia, unspecified: Secondary | ICD-10-CM | POA: Insufficient documentation

## 2016-09-17 DIAGNOSIS — I509 Heart failure, unspecified: Secondary | ICD-10-CM | POA: Diagnosis not present

## 2016-09-17 HISTORY — PX: ATRIAL FIBRILLATION ABLATION: EP1191

## 2016-09-17 LAB — GLUCOSE, CAPILLARY
Glucose-Capillary: 266 mg/dL — ABNORMAL HIGH (ref 65–99)
Glucose-Capillary: 200 mg/dL — ABNORMAL HIGH (ref 65–99)
Glucose-Capillary: 306 mg/dL — ABNORMAL HIGH (ref 65–99)
Glucose-Capillary: 289 mg/dL — ABNORMAL HIGH (ref 65–99)
Glucose-Capillary: 201 mg/dL — ABNORMAL HIGH (ref 65–99)
Glucose-Capillary: 138 mg/dL — ABNORMAL HIGH (ref 65–99)

## 2016-09-17 LAB — POCT ACTIVATED CLOTTING TIME
Activated Clotting Time: 263 seconds
Activated Clotting Time: 313 seconds
Activated Clotting Time: 323 seconds
Activated Clotting Time: 186 seconds
Activated Clotting Time: 285 seconds

## 2016-09-17 SURGERY — ATRIAL FIBRILLATION ABLATION
Anesthesia: General

## 2016-09-17 MED ORDER — SODIUM CHLORIDE 0.9% FLUSH: 3.0000 mL | INTRAVENOUS | Status: DC | PRN

## 2016-09-17 MED ORDER — HEPARIN SODIUM (PORCINE) 1000 UNIT/ML IJ SOLN
INTRAMUSCULAR | Status: DC | PRN
  Administered 2016-09-17: 6000 [IU] via INTRAVENOUS
  Administered 2016-09-17: 14000 [IU] via INTRAVENOUS
  Administered 2016-09-17 (×2): 2000 [IU] via INTRAVENOUS

## 2016-09-17 MED ORDER — BUPIVACAINE HCL (PF) 0.25 % IJ SOLN
INTRAMUSCULAR | Status: DC | PRN
  Administered 2016-09-17: 30 mL

## 2016-09-17 MED ORDER — HEPARIN (PORCINE) IN NACL 2-0.9 UNIT/ML-% IJ SOLN
INTRAMUSCULAR | Status: AC
  Filled 2016-09-17: qty 500

## 2016-09-17 MED ORDER — LISINOPRIL 5 MG PO TABS
5.0000 mg | ORAL_TABLET | Freq: Every day | ORAL | Status: DC
  Administered 2016-09-17: 5 mg via ORAL
  Filled 2016-09-17: qty 1

## 2016-09-17 MED ORDER — METOPROLOL TARTRATE 50 MG PO TABS
50.0000 mg | ORAL_TABLET | Freq: Two times a day (BID) | ORAL | Status: DC
  Administered 2016-09-17 (×2): 50 mg via ORAL
  Filled 2016-09-17 (×2): qty 1

## 2016-09-17 MED ORDER — PHENYLEPHRINE HCL 10 MG/ML IJ SOLN
INTRAMUSCULAR | Status: DC | PRN
  Administered 2016-09-17: 20 ug/min via INTRAVENOUS

## 2016-09-17 MED ORDER — INSULIN NPH (HUMAN) (ISOPHANE) 100 UNIT/ML ~~LOC~~ SUSP
20.0000 [IU] | Freq: Every day | SUBCUTANEOUS | Status: DC
  Administered 2016-09-17: 20 [IU] via SUBCUTANEOUS
  Filled 2016-09-17: qty 10

## 2016-09-17 MED ORDER — MIDAZOLAM HCL 5 MG/5ML IJ SOLN
INTRAMUSCULAR | Status: DC | PRN
  Administered 2016-09-17: 2 mg via INTRAVENOUS

## 2016-09-17 MED ORDER — FUROSEMIDE 40 MG PO TABS: 40.0000 mg | ORAL_TABLET | Freq: Every day | ORAL | Status: DC | PRN

## 2016-09-17 MED ORDER — SODIUM CHLORIDE 0.9 % IV SOLN: 250.0000 mL | INTRAVENOUS | Status: DC | PRN

## 2016-09-17 MED ORDER — LACTATED RINGERS IV SOLN
INTRAVENOUS | Status: DC | PRN
  Administered 2016-09-17: 07:00:00 via INTRAVENOUS

## 2016-09-17 MED ORDER — LIDOCAINE HCL (CARDIAC) 20 MG/ML IV SOLN
INTRAVENOUS | Status: DC | PRN
  Administered 2016-09-17: 80 mg via INTRAVENOUS

## 2016-09-17 MED ORDER — HEPARIN (PORCINE) IN NACL 2-0.9 UNIT/ML-% IJ SOLN
INTRAMUSCULAR | Status: AC | PRN
  Administered 2016-09-17: 2000 mL

## 2016-09-17 MED ORDER — SUGAMMADEX SODIUM 200 MG/2ML IV SOLN
INTRAVENOUS | Status: DC | PRN
  Administered 2016-09-17: 200 mg via INTRAVENOUS

## 2016-09-17 MED ORDER — APIXABAN 5 MG PO TABS
5.0000 mg | ORAL_TABLET | Freq: Two times a day (BID) | ORAL | Status: DC
  Administered 2016-09-17: 5 mg via ORAL
  Filled 2016-09-17 (×2): qty 1

## 2016-09-17 MED ORDER — PROPOFOL 10 MG/ML IV BOLUS
INTRAVENOUS | Status: DC | PRN
  Administered 2016-09-17: 130 mg via INTRAVENOUS

## 2016-09-17 MED ORDER — ROCURONIUM BROMIDE 100 MG/10ML IV SOLN
INTRAVENOUS | Status: DC | PRN
  Administered 2016-09-17: 20 mg via INTRAVENOUS
  Administered 2016-09-17: 50 mg via INTRAVENOUS

## 2016-09-17 MED ORDER — HEPARIN SODIUM (PORCINE) 1000 UNIT/ML IJ SOLN
INTRAMUSCULAR | Status: DC | PRN
  Administered 2016-09-17: 1000 [IU] via INTRAVENOUS

## 2016-09-17 MED ORDER — SODIUM CHLORIDE 0.9% FLUSH
3.0000 mL | Freq: Two times a day (BID) | INTRAVENOUS | Status: DC
  Administered 2016-09-17 (×2): 3 mL via INTRAVENOUS

## 2016-09-17 MED ORDER — ONDANSETRON HCL 4 MG/2ML IJ SOLN
INTRAMUSCULAR | Status: DC | PRN
  Administered 2016-09-17: 4 mg via INTRAVENOUS

## 2016-09-17 MED ORDER — INSULIN ASPART 100 UNIT/ML ~~LOC~~ SOLN
0.0000 [IU] | SUBCUTANEOUS | Status: DC
  Administered 2016-09-17: 8 [IU] via SUBCUTANEOUS
  Administered 2016-09-17: 5 [IU] via SUBCUTANEOUS
  Administered 2016-09-18: 8 [IU] via SUBCUTANEOUS

## 2016-09-17 MED ORDER — BUPIVACAINE HCL (PF) 0.25 % IJ SOLN
INTRAMUSCULAR | Status: AC
  Filled 2016-09-17: qty 30

## 2016-09-17 MED ORDER — FENTANYL CITRATE (PF) 100 MCG/2ML IJ SOLN
INTRAMUSCULAR | Status: DC | PRN
  Administered 2016-09-17 (×2): 50 ug via INTRAVENOUS
  Administered 2016-09-17: 100 ug via INTRAVENOUS

## 2016-09-17 MED ORDER — DEXTROSE 5 % IV SOLN
INTRAVENOUS | Status: DC | PRN
  Administered 2016-09-17: 6 ug/min via INTRAVENOUS

## 2016-09-17 MED ORDER — ACETAMINOPHEN 325 MG PO TABS: 650.0000 mg | ORAL_TABLET | ORAL | Status: DC | PRN

## 2016-09-17 MED ORDER — PROTAMINE SULFATE 10 MG/ML IV SOLN
INTRAVENOUS | Status: DC | PRN
  Administered 2016-09-17: 40 mg via INTRAVENOUS

## 2016-09-17 MED ORDER — ISOPROTERENOL HCL 0.2 MG/ML IJ SOLN
INTRAMUSCULAR | Status: AC
  Filled 2016-09-17: qty 5

## 2016-09-17 MED ORDER — ONDANSETRON HCL 4 MG/2ML IJ SOLN
4.0000 mg | Freq: Four times a day (QID) | INTRAMUSCULAR | Status: DC | PRN
  Administered 2016-09-18: 4 mg via INTRAVENOUS
  Filled 2016-09-17: qty 2

## 2016-09-17 MED ORDER — HEPARIN (PORCINE) IN NACL 2-0.9 UNIT/ML-% IJ SOLN
INTRAMUSCULAR | Status: AC | PRN
  Administered 2016-09-17: 500 mL

## 2016-09-17 MED ORDER — SUCCINYLCHOLINE CHLORIDE 20 MG/ML IJ SOLN
INTRAMUSCULAR | Status: DC | PRN
  Administered 2016-09-17: 110 mg via INTRAVENOUS

## 2016-09-17 MED ORDER — HEPARIN SODIUM (PORCINE) 1000 UNIT/ML IJ SOLN
INTRAMUSCULAR | Status: AC
  Filled 2016-09-17: qty 1

## 2016-09-17 SURGICAL SUPPLY — 24 items
BAG SNAP BAND KOVER 36X36 (MISCELLANEOUS) ×2 IMPLANT
BLANKET WARM UNDERBOD FULL ACC (MISCELLANEOUS) ×2 IMPLANT
CATH MAPPNG PENTARAY F 2-6-2MM (CATHETERS) IMPLANT
CATH SMTCH THERMOCOOL SF DF (CATHETERS) ×1 IMPLANT
CATH SOUNDSTAR 3D IMAGING (CATHETERS) ×1 IMPLANT
CATH WEBSTER BI DIR CS D-F CRV (CATHETERS) ×1 IMPLANT
COVER PRB 48X5XTLSCP FOLD TPE (BAG) IMPLANT
COVER PROBE 5X48 (BAG) ×2
COVER SWIFTLINK CONNECTOR (BAG) ×2 IMPLANT
NDL TRANSSEPTAL BRK 98CM (NEEDLE) IMPLANT
NDL TRANSSEPTAL BRK XS 98CM (NEEDLE) IMPLANT
NEEDLE TRANSSEPTAL BRK 98CM (NEEDLE) ×2 IMPLANT
NEEDLE TRANSSEPTAL BRK XS 98CM (NEEDLE) ×2 IMPLANT
PACK EP LATEX FREE (CUSTOM PROCEDURE TRAY) ×2
PACK EP LF (CUSTOM PROCEDURE TRAY) ×1 IMPLANT
PAD DEFIB LIFELINK (PAD) ×2 IMPLANT
PATCH CARTO3 (PAD) ×1 IMPLANT
PENTARAY F 2-6-2MM (CATHETERS) ×2
SHEATH AGILIS NXT 8.5F 71CM (SHEATH) ×2 IMPLANT
SHEATH AVANTI 11F 11CM (SHEATH) ×1 IMPLANT
SHEATH PINNACLE 7F 10CM (SHEATH) ×1 IMPLANT
SHEATH PINNACLE 8F 10CM (SHEATH) ×2 IMPLANT
SHEATH PINNACLE 9F 10CM (SHEATH) ×2 IMPLANT
TUBING SMART ABLATE COOLFLOW (TUBING) ×1 IMPLANT

## 2016-09-17 NOTE — Discharge Summary (Signed)
ELECTROPHYSIOLOGY PROCEDURE DISCHARGE SUMMARY    Patient ID: Matthew [name],  MRN: [id number], DOB/AGE: 08/13/1945 71 y.o.  Admit date: 09/17/2016 Discharge date: 09/18/2016  Primary Care Physician: [name], MD Electrophysiologist: Lovena Le  Primary Discharge Diagnosis:  Atypical atrial flutter s/p PVI ablation this admission  Secondary Discharge Diagnosis:  1.  CAD 2.  Chronic systolic heart failure 3.  Hyperlipidemia 4.  PTSD 5.  Diabetes 6.  Prior testicular cancer  Procedures This Admission:  1.  Electrophysiology study and radiofrequency catheter ablation on 09/17/16 by Dr Curt Bears.  This study demonstrated sinus rhythm upon presentation; successful electrical isolation and anatomical encircling of all four pulmonary veins with radiofrequency current; no inducible arrhythmias following ablation both on and off of Isuprel; no early apparent complications.    Brief HPI: Matthew [name] is a 71 y.o. male with a history of atypical atrial flutter.  They have failed medical therapy with amiodarone. Risks, benefits, and alternatives to catheter ablation of atrial fibrillation were reviewed with the patient who wished to proceed.  The patient underwent cardiac CT prior to the procedure which demonstrated no LAA thrombus.    Hospital Course:  The patient was admitted and underwent EPS/RFCA of atrial fibrillation with details as outlined above.  They were monitored on telemetry overnight which demonstrated sinus rhythm.  Groin was without complication on the day of discharge.  The patient was examined and considered to be stable for discharge.  Wound care and restrictions were reviewed with the patient.  The patient will be seen back by Roderic Palau, NP in 4 weeks and Dr Curt Bears in 12 weeks for post ablation follow up.   This patients CHA2DS2-VASc Score and unadjusted Ischemic Stroke Rate (% per year) is equal to 4.8 % stroke rate/year from a score of 4 Above score calculated as  1 point each if present [CHF, HTN, DM, Vascular=MI/PAD/Aortic Plaque, Age if 65-74, or Male] Above score calculated as 2 points each if present [Age > 75, or Stroke/TIA/TE]   Physical Exam: Vitals:   09/17/16 2104 09/17/16 2126 09/18/16 0036 09/18/16 0419  BP: 127/61  (!) 115/49 (!) 97/42  Pulse: 64 65 68 65  Resp:      Temp: 98 F (36.7 C)  99.4 F (37.4 C) 98.7 F (37.1 C)  TempSrc: Oral  Oral Oral  SpO2: 97%  94% 93%  Weight:    198 lb 12.8 oz (90.2 kg)  Height:        GEN- The patient is well appearing, alert and oriented x 3 today.   HEENT: normocephalic, atraumatic; sclera clear, conjunctiva pink; hearing intact; oropharynx clear; neck supple  Lungs- Clear to ausculation bilaterally, normal work of breathing.  No wheezes, rales, rhonchi Heart- Regular rate and rhythm, no murmurs, rubs or gallops  GI- soft, non-tender, non-distended, bowel sounds present  Extremities- no clubbing, cyanosis, or edema; DP/PT/radial pulses 2+ bilaterally, groin without hematoma/bruit MS- no significant deformity or atrophy Skin- warm and dry, no rash or lesion Psych- euthymic mood, full affect Neuro- strength and sensation are intact   Labs:   Lab Results  Component Value Date   WBC 6.5 09/09/2016   HGB 12.3 (L) 11/15/2015   HCT 36.4 (L) 09/09/2016   MCV 91 09/09/2016   PLT 236 09/09/2016   No results for input(s): NA, K, CL, CO2, BUN, CREATININE, CALCIUM, PROT, BILITOT, ALKPHOS, ALT, AST, GLUCOSE in the last 168 hours.  Invalid input(s): LABALBU   Discharge Medications:  Allergies as of 09/18/2016  Reactions   Actos [pioglitazone] Swelling   Bee Venom Swelling   Codeine Hives, Nausea Only   Statins Other (See Comments)   Muscle pain, fatigue   Glucophage [metformin Hcl] Nausea Only   Raspberry Itching      Medication List    TAKE these medications   apixaban 5 MG Tabs tablet Commonly known as:  ELIQUIS Take 1 tablet (5 mg total) by mouth 2 (two) times daily.     BD PEN NEEDLE NANO U/F 32G X 4 MM Misc Generic drug:  Insulin Pen Needle Use 3-4 per day to inject insulin   bimatoprost 0.01 % Soln Commonly known as:  LUMIGAN Place 1 drop into the left eye at bedtime.   Co Q 10 100 MG Caps Take 1 capsule by mouth daily. What changed:  how much to take   furosemide 40 MG tablet Commonly known as:  LASIX Take 40 mg by mouth daily as needed for fluid or edema.   insulin aspart 100 UNIT/ML injection Commonly known as:  NOVOLOG Inject 12-15 units in the skin 3 times daily What changed:  how much to take  how to take this  when to take this  additional instructions   insulin NPH Human 100 UNIT/ML injection Commonly known as:  NOVOLIN N Inject 26 units in the skin daily What changed:  how much to take  how to take this  when to take this  additional instructions   KRILL OIL PO Take 1 capsule by mouth daily.   lisinopril 5 MG tablet Commonly known as:  PRINIVIL,ZESTRIL TAKE 1 TABLET (5 mg) BY MOUTH EVERY DAY   metoprolol tartrate 50 MG tablet Commonly known as:  LOPRESSOR Take 0.5 tablets (25 mg total) by mouth 2 (two) times daily. What changed:  how much to take       Disposition:  Discharge Instructions    Diet - low sodium heart healthy    Complete by:  As directed    Increase activity slowly    Complete by:  As directed      Follow-up Information    [facility] Follow up on 10/30/2016.   Specialty:  Cardiology Why:  at [facility] information: [address] [id number] Danice Goltz [place] [address] [phone]       [name], MD Follow up on 12/21/2016.   Specialty:  Cardiology Why:  at 9:30AM Contact information: [place] [address] [phone]           Duration of Discharge Encounter: Greater than 30 minutes including physician time.  Signed, Chanetta Marshall, NP 09/18/2016 6:48 AM  I have seen and examined this  patient with Chanetta Marshall.  Agree with above, note added to reflect my findings.  On exam, RRR, no murmurs, lungs clear. Patient with atypical atrial flutter. Had ablation with PVI and LA roof line. tolerated procedure without issues. Plan for discharge today with follow up in AF clinic.    Will M. Camnitz MD 09/18/2016 7:54 AM

## 2016-09-17 NOTE — H&P (Signed)
Matthew [name] is a 71 y.o. male with a history of atrial flutter which at ablation was found to be left atrial. He presents for ablation today. On exam, RRR, no murmurs, lungs clear. Risks and benefits of ablation were discussed. Risks include but not limited to bleeding, infection, tamponade, pneumothorax. The patient understands the risks and has agreed to ablation.  Will Curt Bears, MD 09/17/2016 7:04 AM

## 2016-09-17 NOTE — Progress Notes (Signed)
Site area: Left groin a 7 and 11 french venous sheath was removed  Site Prior to Removal:  Level 0  Pressure Applied For 20 MINUTES    Bedrest Beginning at   Manual:   Yes.    Patient Status During Pull:  stable  Post Pull Groin Site:  Level 0  Post Pull Instructions Given:  Yes.    Post Pull Pulses Present:  Yes.    Dressing Applied:  Yes.  Pressure dressing applied  Comments:  VS  Remain stable during sheath pull

## 2016-09-17 NOTE — Discharge Instructions (Signed)
No driving for 4 days. No lifting over 5 lbs for 1 week. No sexual activity for 1 week. You may return to work in 1 week. Keep procedure site clean & dry. If you notice increased pain, swelling, bleeding or pus, call/return!  You may shower, but no soaking baths/hot tubs/pools for 1 week.  ° ° °You have an appointment set up with the Atrial Fibrillation Clinic.  Multiple studies have shown that being followed by a dedicated atrial fibrillation clinic in addition to the standard care you receive from your other physicians improves health. We believe that enrollment in the atrial fibrillation clinic will allow us to better care for you.  ° °The phone number to the Atrial Fibrillation Clinic is [phone]. The clinic is staffed Monday through Friday from 8:30am to 5pm. ° °Parking Directions: The clinic is located in the Heart and Vascular Building connected to [facility]. °1)From [address] turn on to [address] and go to the 3rd entrance  (Heart and Vascular entrance) on the right. °2)Look to the right for Heart &Vascular Parking Garage. °3)A code for the entrance is required please call the clinic to receive this.   °4)Take the elevators to the 1st floor. Registration is in the room with the glass walls at the end of the hallway. ° °If you have any trouble parking or locating the clinic, please don’t hesitate to call [phone]. ° ° °

## 2016-09-17 NOTE — Progress Notes (Signed)
Site area: rt groin fv sheaths Site Prior to Removal:  Level 0 Pressure Applied For:  40 minutes Manual:   yes Patient Status During Pull:  stable Post Pull Site:  Level  0 Post Pull Instructions Given:  yes Post Pull Pulses Present: yes Dressing Applied:  yes Bedrest begins @ 0511 Comments:

## 2016-09-17 NOTE — Anesthesia Postprocedure Evaluation (Signed)
Anesthesia Post Note  Patient: Matthew [name]  Procedure(s) Performed: Procedure(s) (LRB): Atrial Fibrillation Ablation (N/A)  Patient location during evaluation: PACU Anesthesia Type: General Level of consciousness: awake and alert Pain management: pain level controlled Vital Signs Assessment: post-procedure vital signs reviewed and stable Respiratory status: spontaneous breathing, nonlabored ventilation, respiratory function stable and patient connected to nasal cannula oxygen Cardiovascular status: blood pressure returned to baseline and stable Postop Assessment: no signs of nausea or vomiting Anesthetic complications: no       Last Vitals:  Vitals:   09/17/16 1330 09/17/16 1345  BP: 134/61 (!) 129/58  Pulse: 63 65  Resp: (!) 0 (!) 8  Temp:      Last Pain:  Vitals:   09/17/16 1251  TempSrc:   PainSc: 0-No pain                 Effie Berkshire

## 2016-09-17 NOTE — Transfer of Care (Signed)
Immediate Anesthesia Transfer of Care Note  Patient: Quindon L Jim  Procedure(s) Performed: Procedure(s): Atrial Fibrillation Ablation (N/A)  Patient Location: PACU and Cath Lab  Anesthesia Type:General  Level of Consciousness: awake, alert , oriented and patient cooperative  Airway & Oxygen Therapy: Patient Spontanous Breathing and Patient connected to nasal cannula oxygen  Post-op Assessment: Report given to RN, Post -op Vital signs reviewed and stable, Patient moving all extremities and Patient moving all extremities X 4  Post vital signs: Reviewed and stable  Last Vitals:  Vitals:   09/17/16 0539  BP: (!) 161/78  Pulse: 69  Temp: 36.6 C    Last Pain:  Vitals:   09/17/16 1118  TempSrc:   PainSc: 1       Patients Stated Pain Goal: 5 (94/07/68 0881)  Complications: No apparent anesthesia complications

## 2016-09-17 NOTE — Anesthesia Procedure Notes (Signed)
Procedure Name: Intubation Date/Time: 09/17/2016 7:44 AM Performed by: Izora Gala Pre-anesthesia Checklist: Patient identified, Emergency Drugs available, Suction available and Patient being monitored Patient Re-evaluated:Patient Re-evaluated prior to inductionOxygen Delivery Method: Circle system utilized Preoxygenation: Pre-oxygenation with 100% oxygen Intubation Type: IV induction Ventilation: Mask ventilation without difficulty Laryngoscope Size: Miller and 3 Grade View: Grade II Tube type: Oral Tube size: 7.5 mm Number of attempts: 1 Airway Equipment and Method: LTA kit utilized and Stylet Placement Confirmation: ETT inserted through vocal cords under direct vision,  positive ETCO2 and breath sounds checked- equal and bilateral Secured at: 22 cm Tube secured with: Tape

## 2016-09-17 NOTE — Anesthesia Preprocedure Evaluation (Addendum)
Anesthesia Evaluation  Patient identified by MRN, date of birth, ID band Patient awake  General Assessment Comment:Chart reviewed. Patient examined. Patient and Dr. Curt Bears aware of lung findings and recommendations. CG  Reviewed: Allergy & Precautions, NPO status , Patient's Chart, lab work & pertinent test results  Airway Mallampati: II  TM Distance: >3 FB     Dental   Pulmonary pneumonia, former smoker,    breath sounds clear to auscultation       Cardiovascular + CAD, + Past MI and +CHF   Rhythm:Regular Rate:Normal     Neuro/Psych  Headaches, Anxiety  Neuromuscular disease    GI/Hepatic negative GI ROS, Neg liver ROS,   Endo/Other  diabetes  Renal/GU negative Renal ROS     Musculoskeletal  (+) Arthritis ,   Abdominal   Peds  Hematology  (+) anemia ,   Anesthesia Other Findings   Reproductive/Obstetrics                            Anesthesia Physical Anesthesia Plan  ASA: III  Anesthesia Plan: General   Post-op Pain Management:    Induction: Intravenous  Airway Management Planned: Oral ETT  Additional Equipment:   Intra-op Plan:   Post-operative Plan: Extubation in OR  Informed Consent: I have reviewed the patients History and Physical, chart, labs and discussed the procedure including the risks, benefits and alternatives for the proposed anesthesia with the patient or authorized representative who has indicated his/her understanding and acceptance.   Dental advisory given  Plan Discussed with: CRNA and Anesthesiologist  Anesthesia Plan Comments:         Anesthesia Quick Evaluation

## 2016-09-18 DIAGNOSIS — R69 Illness, unspecified: Secondary | ICD-10-CM | POA: Diagnosis not present

## 2016-09-18 DIAGNOSIS — E785 Hyperlipidemia, unspecified: Secondary | ICD-10-CM | POA: Diagnosis not present

## 2016-09-18 DIAGNOSIS — I5022 Chronic systolic (congestive) heart failure: Secondary | ICD-10-CM | POA: Diagnosis not present

## 2016-09-18 DIAGNOSIS — I484 Atypical atrial flutter: Secondary | ICD-10-CM | POA: Diagnosis not present

## 2016-09-18 DIAGNOSIS — Z87891 Personal history of nicotine dependence: Secondary | ICD-10-CM | POA: Diagnosis not present

## 2016-09-18 DIAGNOSIS — I4891 Unspecified atrial fibrillation: Secondary | ICD-10-CM | POA: Diagnosis not present

## 2016-09-18 DIAGNOSIS — E119 Type 2 diabetes mellitus without complications: Secondary | ICD-10-CM | POA: Diagnosis not present

## 2016-09-18 DIAGNOSIS — I252 Old myocardial infarction: Secondary | ICD-10-CM | POA: Diagnosis not present

## 2016-09-18 DIAGNOSIS — Z794 Long term (current) use of insulin: Secondary | ICD-10-CM | POA: Diagnosis not present

## 2016-09-18 DIAGNOSIS — I2510 Atherosclerotic heart disease of native coronary artery without angina pectoris: Secondary | ICD-10-CM | POA: Diagnosis not present

## 2016-09-18 LAB — GLUCOSE, CAPILLARY
Glucose-Capillary: 141 mg/dL — ABNORMAL HIGH (ref 65–99)
Glucose-Capillary: 262 mg/dL — ABNORMAL HIGH (ref 65–99)
Glucose-Capillary: 266 mg/dL — ABNORMAL HIGH (ref 65–99)

## 2016-09-18 NOTE — Plan of Care (Signed)
Problem: Cardiac: Goal: Vascular access site(s) Level 0-1 will be maintained Outcome: Progressing Pt's right cath site remains level 1 and did not exceed the marks on the dressing from the beginning of this shift. L cath site is level 0. No complaints of pain. Will continue to monitor and assess.   Problem: Safety: Goal: Ability to remain free from injury will improve Outcome: Progressing No injuries this shift. Fall risk bundle in place. W

## 2016-09-18 NOTE — Plan of Care (Signed)
Problem: Cardiac: Goal: Vascular access site(s) Level 0-1 will be maintained Outcome: Completed/Met Date Met: 09/18/16 Vascular site a level 0. Pt educated on site care & restrictions.   Problem: Health Behavior/ Discharge Planning: Goal: Ability to safely manage health related needs after discharge Outcome: Completed/Met Date Met: 09/18/16 Pt educated & instructed on f/u appmts, meds, when to call 911  Problem: Education: Goal: Knowledge of [facility] Education information/materials will improve Outcome: Completed/Met Date Met: 09/18/16 Pt educated on infection prevention & POC, pain scale & medications thorougly from admission to floor on 09/17/2016 until d/c on 09/18/2016. All questions were answered & verified via teachback method.

## 2016-09-18 NOTE — Progress Notes (Signed)
Pt educated on f/u appointments, meds, when to call 911 as well as site care. All questions were answered & education verified via teach back method. Pt's IV was removed & CCMD was notified of pt being di/c'd. Pt was sure to have all of his belongings upon d/c. Hoover Brunette, RN

## 2016-09-25 NOTE — Addendum Note (Signed)
Addendum  created 09/25/16 1147 by Effie Berkshire, MD   Sign clinical note

## 2016-09-25 NOTE — Anesthesia Postprocedure Evaluation (Signed)
Anesthesia Post Note  Patient: Matthew [name]  Procedure(s) Performed: Procedure(s) (LRB): Atrial Fibrillation Ablation (N/A)     Anesthesia Post Evaluation  Last Vitals:  Vitals:   09/18/16 0419 09/18/16 0842  BP: (!) 97/42 126/61  Pulse: 65   Resp:    Temp: 37.1 C     Last Pain:  Vitals:   09/18/16 0842  TempSrc:   PainSc: 0-No pain                 Effie Berkshire

## 2016-10-27 ENCOUNTER — Ambulatory Visit: Payer: Medicare HMO | Admitting: Cardiology

## 2016-10-30 ENCOUNTER — Ambulatory Visit (HOSPITAL_COMMUNITY)
Admission: RE | Admit: 2016-10-30 | Discharge: 2016-10-30 | Disposition: A | Discharge status: Home / Self Care | Payer: Medicare HMO | Source: Ambulatory Visit | Attending: Nurse Practitioner | Admitting: Nurse Practitioner

## 2016-10-30 ENCOUNTER — Encounter (HOSPITAL_COMMUNITY): Payer: Self-pay | Admitting: Nurse Practitioner

## 2016-10-30 VITALS — BP 140/64 | HR 83 | Ht 66.0 in | Wt 196.0 lb

## 2016-10-30 DIAGNOSIS — I4892 Unspecified atrial flutter: Secondary | ICD-10-CM

## 2016-10-30 DIAGNOSIS — Z7902 Long term (current) use of antithrombotics/antiplatelets: Secondary | ICD-10-CM | POA: Diagnosis not present

## 2016-10-30 DIAGNOSIS — I252 Old myocardial infarction: Secondary | ICD-10-CM | POA: Diagnosis not present

## 2016-10-30 DIAGNOSIS — Z8547 Personal history of malignant neoplasm of testis: Secondary | ICD-10-CM | POA: Insufficient documentation

## 2016-10-30 DIAGNOSIS — F4310 Post-traumatic stress disorder, unspecified: Secondary | ICD-10-CM | POA: Insufficient documentation

## 2016-10-30 DIAGNOSIS — Z91018 Allergy to other foods: Secondary | ICD-10-CM | POA: Insufficient documentation

## 2016-10-30 DIAGNOSIS — H409 Unspecified glaucoma: Secondary | ICD-10-CM | POA: Insufficient documentation

## 2016-10-30 DIAGNOSIS — I2510 Atherosclerotic heart disease of native coronary artery without angina pectoris: Secondary | ICD-10-CM | POA: Insufficient documentation

## 2016-10-30 DIAGNOSIS — Z885 Allergy status to narcotic agent status: Secondary | ICD-10-CM | POA: Diagnosis not present

## 2016-10-30 DIAGNOSIS — Z91030 Bee allergy status: Secondary | ICD-10-CM | POA: Diagnosis not present

## 2016-10-30 DIAGNOSIS — Z794 Long term (current) use of insulin: Secondary | ICD-10-CM | POA: Insufficient documentation

## 2016-10-30 DIAGNOSIS — E11319 Type 2 diabetes mellitus with unspecified diabetic retinopathy without macular edema: Secondary | ICD-10-CM | POA: Diagnosis not present

## 2016-10-30 DIAGNOSIS — E785 Hyperlipidemia, unspecified: Secondary | ICD-10-CM | POA: Insufficient documentation

## 2016-10-30 DIAGNOSIS — Z8249 Family history of ischemic heart disease and other diseases of the circulatory system: Secondary | ICD-10-CM | POA: Diagnosis not present

## 2016-10-30 DIAGNOSIS — Z9079 Acquired absence of other genital organ(s): Secondary | ICD-10-CM | POA: Diagnosis not present

## 2016-10-30 DIAGNOSIS — Z833 Family history of diabetes mellitus: Secondary | ICD-10-CM | POA: Diagnosis not present

## 2016-10-30 DIAGNOSIS — I509 Heart failure, unspecified: Secondary | ICD-10-CM | POA: Diagnosis not present

## 2016-10-30 DIAGNOSIS — I4891 Unspecified atrial fibrillation: Secondary | ICD-10-CM | POA: Insufficient documentation

## 2016-10-30 DIAGNOSIS — Z888 Allergy status to other drugs, medicaments and biological substances status: Secondary | ICD-10-CM | POA: Diagnosis not present

## 2016-10-30 DIAGNOSIS — Z87891 Personal history of nicotine dependence: Secondary | ICD-10-CM | POA: Diagnosis not present

## 2016-10-30 DIAGNOSIS — R69 Illness, unspecified: Secondary | ICD-10-CM | POA: Diagnosis not present

## 2016-10-30 DIAGNOSIS — Z951 Presence of aortocoronary bypass graft: Secondary | ICD-10-CM | POA: Insufficient documentation

## 2016-10-30 DIAGNOSIS — Z9889 Other specified postprocedural states: Secondary | ICD-10-CM | POA: Diagnosis not present

## 2016-10-30 NOTE — Progress Notes (Signed)
Primary Care Physician: [name], MD Referring Physician: Dr. Curt Bears   Matthew [name] is a 71 y.o. male with a h/o afib that had ablation 5/24. He is in the afib clinic for f/u. He has noted very few palpitations. Continues on eliquis 5 mg bid. No swallowing or groin issues.  Today, he denies symptoms of palpitations, chest pain, shortness of breath, orthopnea, PND, lower extremity edema, dizziness, presyncope, syncope, or neurologic sequela. The patient is tolerating medications without difficulties and is otherwise without complaint today.   Past Medical History:  Diagnosis Date  . Anemia   . Arthritis    "back, left ankle" (11/20/2015)  . CHF (congestive heart failure) ([place])   . Chronic lower back pain   . Coronary artery disease    a. 05/2009 CABG x 3: LIMA->LAD, VG->OM, VG->PDA; b. Nuc 01/2014: inf-lat scar but no ischemia, EF 57%.  . Diabetic retinopathy ([facility])    mild- Dr. Herbert Deaner  . Glaucoma   . Hyperlipidemia    a. Intolerant of lipitor and vytorin.  . Migraine    "once or twice" (11/20/2015)  . Myocardial infarction [facility])    'saw evidence of it on an EKG done in 2005"  . Paroxysmal atrial flutter ([place])    a. 01/2014 s/p DCCV;  b. CHA2DS2VASc = 3-->Eliquis.  . Pneumonia 1960  . PTSD (post-traumatic stress disorder)    "X 2 yr post OHS" (10/31/2014)  . Sacral pain    "right"  . Sciatica of right side   . Testicular cancer ([place]) 1982   "heavy doses of radiation; it was stage 2"  . Type II diabetes mellitus ([place])   . Valvular heart disease    a. 01/2014 Echo: Ef 60-65%, no rwma, mild AI/MS, mod MR, mildly dil LA.   Past Surgical History:  Procedure Laterality Date  . ABDOMINAL EXPLORATION SURGERY  ~ 2005   "for hernia, but didn't have one"  . ATRIAL FIBRILLATION ABLATION N/A 09/17/2016   Procedure: Atrial Fibrillation Ablation;  Surgeon: [name], MD;  Location: [facility] CV LAB;  Service: Cardiovascular;  Laterality: N/A;  . CARDIAC  CATHETERIZATION  2011  . CARDIOVERSION N/A 02/19/2014   Procedure: CARDIOVERSION;  Surgeon: [name], MD;  Location: [facility] ENDOSCOPY;  Service: Cardiovascular;  Laterality: N/A;  . CARDIOVERSION N/A 10/28/2015   Procedure: CARDIOVERSION;  Surgeon: [name], MD;  Location: [facility] ENDOSCOPY;  Service: Cardiovascular;  Laterality: N/A;  . CARDIOVERSION  11/20/2015   "200 joules"  . CORONARY ARTERY BYPASS GRAFT  05/2009   LIMA-LAD, SVG-OM, SVG-PDA 06/20/09  . ELBOW FRACTURE SURGERY Left    broken ulna on left elbow-surgical repair  . ELECTROPHYSIOLOGIC STUDY N/A 10/31/2014   Procedure: A-Flutter;  Surgeon: [name], MD;  Location: [facility] CV LAB;  Service: Cardiovascular;  Laterality: N/A;  . ELECTROPHYSIOLOGIC STUDY N/A 11/20/2015   Procedure: A-Flutter Ablation;  Surgeon: [name], MD;  Location: [facility] CV LAB;  Service: Cardiovascular;  Laterality: N/A;  . FRACTURE SURGERY    . TESTICLE REMOVAL Right 1982  . TONSILLECTOMY  ~ 1967    Current Outpatient Prescriptions  Medication Sig Dispense Refill  . apixaban (ELIQUIS) 5 MG TABS tablet Take 1 tablet (5 mg total) by mouth 2 (two) times daily. 180 tablet 3  . BD PEN NEEDLE NANO U/F 32G X 4 MM MISC Use 3-4 per day to inject insulin 200 each 1  . bimatoprost (LUMIGAN) 0.01 % SOLN Place 1 drop into  the left eye at bedtime.    . insulin aspart (NOVOLOG) 100 UNIT/ML injection Inject 12-15 units in the skin 3 times daily (Patient taking differently: Inject 0-15 Units into the skin daily. Inject 0-15 units in the skin once a day per sliding scale.) 30 mL 5  . insulin NPH Human (NOVOLIN N) 100 UNIT/ML injection Inject 26 units in the skin daily (Patient taking differently: Inject 20-25 Units into the skin at bedtime. Dose depends on how much he ate that day.) 10 mL 11  . KRILL OIL PO Take 1 capsule by mouth daily.    Marland Kitchen lisinopril (PRINIVIL,ZESTRIL) 5 MG tablet TAKE 1 TABLET (5 mg) BY MOUTH EVERY DAY 90 tablet 2  . metoprolol  (LOPRESSOR) 50 MG tablet Take 0.5 tablets (25 mg total) by mouth 2 (two) times daily. 90 tablet 3  . Coenzyme Q10 (CO Q 10) 100 MG CAPS Take 1 capsule by mouth daily. (Patient not taking: Reported on 10/30/2016) 90 capsule 1  . furosemide (LASIX) 40 MG tablet Take 40 mg by mouth daily as needed for fluid or edema.      No current facility-administered medications for this encounter.     Allergies  Allergen Reactions  . Actos [Pioglitazone] Swelling  . Bee Venom Swelling  . Codeine Hives and Nausea Only  . Statins Other (See Comments)    Muscle pain, fatigue  . Glucophage [Metformin Hcl] Nausea Only  . Raspberry Itching    Social History   Social History  . Marital status: Married    Spouse name: N/A  . Number of children: N/A  . Years of education: N/A   Occupational History  . Not on file.   Social History Main Topics  . Smoking status: Former Smoker    Packs/day: 0.00    Years: 0.00    Types: Cigarettes  . Smokeless tobacco: Never Used     Comment: "quit smoking cigarettes in the 1980's; don't know how much or for how long"  . Alcohol use 1.8 oz/week    3 Cans of beer per week  . Drug use: No     Comment: "quit smoking pot in the 1990s"  . Sexual activity: Not Currently    Birth control/ protection: Coitus interruptus   Other Topics Concern  . Not on file   Social History Narrative  . No narrative on file    Family History  Problem Relation Age of Onset  . Diabetes Mother   . Heart disease Father   . Heart attack Father   . Diabetes Father   . Hypertension Father   . Diabetes Sister     ROS- All systems are reviewed and negative except as per the HPI above  Physical Exam: Vitals:   10/30/16 1049  BP: 140/64  Pulse: 83  Weight: 196 lb (88.9 kg)  Height: 5\' 6"  (1.676 m)   Wt Readings from Last 3 Encounters:  10/30/16 196 lb (88.9 kg)  09/18/16 198 lb 12.8 oz (90.2 kg)  09/09/16 197 lb 6.4 oz (89.5 kg)    Labs: Lab Results  Component Value  Date   NA 137 09/09/2016   K 5.0 09/09/2016   CL 98 09/09/2016   CO2 23 09/09/2016   GLUCOSE 234 (H) 09/09/2016   BUN 26 09/09/2016   CREATININE 1.09 09/09/2016   CALCIUM 9.2 09/09/2016   MG 2.3 11/22/2015   Lab Results  Component Value Date   INR 1.1 10/24/2015   Lab Results  Component Value Date  CHOL 170 03/26/2016   HDL 48.90 03/26/2016   LDLCALC 109 (H) 03/26/2016   TRIG 64.0 03/26/2016     GEN- The patient is well appearing, alert and oriented x 3 today.   Head- normocephalic, atraumatic Eyes-  Sclera clear, conjunctiva pink Ears- hearing intact Oropharynx- clear Neck- supple, no JVP Lymph- no cervical lymphadenopathy Lungs- Clear to ausculation bilaterally, normal work of breathing Heart- Regular rate and rhythm, no murmurs, rubs or gallops, PMI not laterally displaced GI- soft, NT, ND, + BS Extremities- no clubbing, cyanosis, or edema MS- no significant deformity or atrophy Skin- no rash or lesion Psych- euthymic mood, full affect Neuro- strength and sensation are intact  EKG-SR with pac's, RBBB, pr int 196 ms, qrs int 136 ms, qtc 495 ms Epic records reviewed    Assessment and Plan: 1. Afib S/p ablation, staying in SR Continue metoprolol  Continue apixaban without missed doses Gradually return to working out at the gym  F/u with Dr. Curt Bears 8/27 afib clinic as needed  Butch Penny C. Carroll, [facility] [address] [place], [place] [address] [phone]

## 2016-11-16 DIAGNOSIS — H40021 Open angle with borderline findings, high risk, right eye: Secondary | ICD-10-CM | POA: Diagnosis not present

## 2016-11-16 DIAGNOSIS — H4032X0 Glaucoma secondary to eye trauma, left eye, stage unspecified: Secondary | ICD-10-CM | POA: Diagnosis not present

## 2016-11-23 DIAGNOSIS — H10021 Other mucopurulent conjunctivitis, right eye: Secondary | ICD-10-CM | POA: Diagnosis not present

## 2016-12-14 ENCOUNTER — Other Ambulatory Visit: Payer: Self-pay | Admitting: Internal Medicine

## 2016-12-14 DIAGNOSIS — I483 Typical atrial flutter: Secondary | ICD-10-CM

## 2016-12-17 ENCOUNTER — Other Ambulatory Visit: Payer: Self-pay | Admitting: Interventional Cardiology

## 2016-12-17 ENCOUNTER — Other Ambulatory Visit: Payer: Self-pay | Admitting: Internal Medicine

## 2016-12-17 NOTE — Telephone Encounter (Signed)
Pt last saw Roderic Palau, NP 10/30/16, last labs 09/09/16 Creat 1.09, age 71, weight 88.9kg, based on specified criteria pt is on appropriate dosage of Eliquis 5mg  BID.  Will refill rx.

## 2016-12-18 DIAGNOSIS — I10 Essential (primary) hypertension: Secondary | ICD-10-CM | POA: Diagnosis not present

## 2016-12-18 DIAGNOSIS — H10023 Other mucopurulent conjunctivitis, bilateral: Secondary | ICD-10-CM | POA: Diagnosis not present

## 2016-12-21 ENCOUNTER — Ambulatory Visit (INDEPENDENT_AMBULATORY_CARE_PROVIDER_SITE_OTHER): Payer: Medicare HMO | Admitting: Cardiology

## 2016-12-21 ENCOUNTER — Encounter: Payer: Self-pay | Admitting: Cardiology

## 2016-12-21 VITALS — BP 132/78 | HR 75 | Ht 66.0 in | Wt 198.8 lb

## 2016-12-21 DIAGNOSIS — I2510 Atherosclerotic heart disease of native coronary artery without angina pectoris: Secondary | ICD-10-CM | POA: Diagnosis not present

## 2016-12-21 DIAGNOSIS — I484 Atypical atrial flutter: Secondary | ICD-10-CM

## 2016-12-21 DIAGNOSIS — Z8679 Personal history of other diseases of the circulatory system: Secondary | ICD-10-CM | POA: Diagnosis not present

## 2016-12-21 DIAGNOSIS — Z9889 Other specified postprocedural states: Secondary | ICD-10-CM | POA: Diagnosis not present

## 2016-12-21 NOTE — Patient Instructions (Signed)
Medication Instructions:    Your physician recommends that you continue on your current medications as directed. Please refer to the Current Medication list given to you today.  - If you need a refill on your cardiac medications before your next appointment, please call your pharmacy.   Labwork:  None ordered  Testing/Procedures:  None ordered  Follow-Up:  Your physician recommends that you schedule a follow-up appointment in: 3 months with Dr. Camnitz.  Thank you for choosing [facility]!!   Sherri Price, RN [phone]         

## 2016-12-21 NOTE — Progress Notes (Signed)
Electrophysiology Office Note   Date:  12/21/2016   ID:  Matthew [name], DOB Nov 25, 1945, MRN [id number]  PCP:  [name], MD  Cardiologist:  Lovena Le Primary Electrophysiologist: [name], MD    Chief Complaint  Patient presents with  . Follow-up    Atypical AFlutter/Post RFA     History of Present Illness: Matthew [name] is a 71 y.o. male who is being seen today for the evaluation of atrial flutter at the request of [name], MD. Presenting today for electrophysiology evaluation. history of CAD hx of CABG 2011, AFlutter ablated, recurrent AFlutter with atypical morphology >> EPS noted LA flutter his atrial flutter isthmus was blocked, initially on Tikosyn with prolongation of his QT eventually on amiodarone, DM.  He was put on the amiodarone he felt tired, poor, though with reduction of the dose he did feel well.   AFlutter Hx: Ablation of typical AFlutter 10/31/14 EPS 11/20/15:  LA flutter > DCCV AAD Hx: July 2017 started on amiodarone Hx of QT prolongation on Tikosyn AF ablation 09/17/16  Today, denies symptoms of palpitations, chest pain, shortness of breath, orthopnea, PND, lower extremity edema, claudication, dizziness, presyncope, syncope, bleeding, or neurologic sequela. The patient is tolerating medications without difficulties and is otherwise without complaint today.     Past Medical History:  Diagnosis Date  . Anemia   . Arthritis    "back, left ankle" (11/20/2015)  . CHF (congestive heart failure) ([place])   . Chronic lower back pain   . Coronary artery disease    a. 05/2009 CABG x 3: LIMA->LAD, VG->OM, VG->PDA; b. Nuc 01/2014: inf-lat scar but no ischemia, EF 57%.  . Diabetic retinopathy (HCC)    mild- Dr. Herbert Deaner  . Glaucoma   . Hyperlipidemia    a. Intolerant of lipitor and vytorin.  . Migraine    "once or twice" (11/20/2015)  . Myocardial infarction [facility])    'saw evidence of it on an EKG done in 2005"  . Paroxysmal atrial flutter  ([place])    a. 01/2014 s/p DCCV;  b. CHA2DS2VASc = 3-->Eliquis.  . Pneumonia 1960  . PTSD (post-traumatic stress disorder)    "X 2 yr post OHS" (10/31/2014)  . Sacral pain    "right"  . Sciatica of right side   . Testicular cancer ([place]) 1982   "heavy doses of radiation; it was stage 2"  . Type II diabetes mellitus ([place])   . Valvular heart disease    a. 01/2014 Echo: Ef 60-65%, no rwma, mild AI/MS, mod MR, mildly dil LA.   Past Surgical History:  Procedure Laterality Date  . ABDOMINAL EXPLORATION SURGERY  ~ 2005   "for hernia, but didn't have one"  . ATRIAL FIBRILLATION ABLATION N/A 09/17/2016   Procedure: Atrial Fibrillation Ablation;  Surgeon: [name], MD;  Location: [place] CV LAB;  Service: Cardiovascular;  Laterality: N/A;  . CARDIAC CATHETERIZATION  2011  . CARDIOVERSION N/A 02/19/2014   Procedure: CARDIOVERSION;  Surgeon: [name], MD;  Location: [facility] ENDOSCOPY;  Service: Cardiovascular;  Laterality: N/A;  . CARDIOVERSION N/A 10/28/2015   Procedure: CARDIOVERSION;  Surgeon: [name], MD;  Location: [facility] ENDOSCOPY;  Service: Cardiovascular;  Laterality: N/A;  . CARDIOVERSION  11/20/2015   "200 joules"  . CORONARY ARTERY BYPASS GRAFT  05/2009   LIMA-LAD, SVG-OM, SVG-PDA 06/20/09  . ELBOW FRACTURE SURGERY Left    broken ulna on left elbow-surgical repair  . ELECTROPHYSIOLOGIC STUDY N/A 10/31/2014   Procedure:  A-Flutter;  Surgeon: [name], MD;  Location: [place] CV LAB;  Service: Cardiovascular;  Laterality: N/A;  . ELECTROPHYSIOLOGIC STUDY N/A 11/20/2015   Procedure: A-Flutter Ablation;  Surgeon: [name], MD;  Location: [place] CV LAB;  Service: Cardiovascular;  Laterality: N/A;  . FRACTURE SURGERY    . TESTICLE REMOVAL Right 1982  . TONSILLECTOMY  ~ 1967     Current Outpatient Prescriptions  Medication Sig Dispense Refill  . BD PEN NEEDLE NANO U/F 32G X 4 MM MISC Use 3-4 per day to inject insulin 200 each 1  . bimatoprost  (LUMIGAN) 0.01 % SOLN Place 1 drop into the left eye at bedtime.    . Coenzyme Q10 (CO Q 10) 100 MG CAPS Take 1 capsule by mouth daily. 90 capsule 1  . ELIQUIS 5 MG TABS tablet TAKE 1 TABLET TWICE DAILY 180 tablet 2  . insulin aspart (NOVOLOG) 100 UNIT/ML injection Inject 12-15 units in the skin 3 times daily (Patient taking differently: Inject 0-15 Units into the skin daily. Inject 0-15 units in the skin once a day per sliding scale.) 30 mL 5  . insulin NPH Human (NOVOLIN N) 100 UNIT/ML injection Inject 26 units in the skin daily (Patient taking differently: Inject 20-25 Units into the skin at bedtime. Dose depends on how much he ate that day.) 10 mL 11  . KRILL OIL PO Take 1 capsule by mouth daily.    Marland Kitchen lisinopril (PRINIVIL,ZESTRIL) 5 MG tablet TAKE 1 TABLET EVERY DAY 90 tablet 2  . metoprolol tartrate (LOPRESSOR) 50 MG tablet TAKE 1/2 TABLET TWICE DAILY 90 tablet 1   No current facility-administered medications for this visit.     Allergies:   Actos [pioglitazone]; Bee venom; Codeine; Statins; Glucophage [metformin hcl]; and Raspberry   Social History:  The patient  reports that he has quit smoking. His smoking use included Cigarettes. He smoked 0.00 packs per day for 0.00 years. He has never used smokeless tobacco. He reports that he drinks about 1.8 oz of alcohol per week . He reports that he does not use drugs.   Family History:  The patient's family history includes Diabetes in his father, mother, and sister; Heart attack in his father; Heart disease in his father; Hypertension in his father.    ROS:  Please see the history of present illness.   Otherwise, review of systems is positive for none.   All other systems are reviewed and negative.   PHYSICAL EXAM: VS:  BP 132/78   Pulse 75   Ht 5\' 6"  (1.676 m)   Wt 198 lb 12.8 oz (90.2 kg)   BMI 32.09 kg/m  , BMI Body mass index is 32.09 kg/m. GEN: Well nourished, well developed, in no acute distress  HEENT: normal  Neck: no JVD,  carotid bruits, or masses Cardiac: RRR; no murmurs, rubs, or gallops,no edema  Respiratory:  clear to auscultation bilaterally, normal work of breathing GI: soft, nontender, nondistended, + BS MS: no deformity or atrophy  Skin: warm and dry Neuro:  Strength and sensation are intact Psych: euthymic mood, full affect  EKG:  EKG is ordered today. Personal review of the ekg ordered shows SR, RBBB, IMI    Recent Labs: 08/05/2016: ALT 35; TSH 2.340 09/09/2016: BUN 26; Creatinine, Ser 1.09; Hemoglobin 12.2; Platelets 236; Potassium 5.0; Sodium 137    Lipid Panel     Component Value Date/Time   CHOL 170 03/26/2016 0845   CHOL 216 (H) 06/21/2013 1405  TRIG 64.0 03/26/2016 0845   TRIG 132 06/21/2013 1405   HDL 48.90 03/26/2016 0845   HDL 54 06/21/2013 1405   CHOLHDL 3 03/26/2016 0845   VLDL 12.8 03/26/2016 0845   LDLCALC 109 (H) 03/26/2016 0845   LDLCALC 136 (H) 06/21/2013 1405     Wt Readings from Last 3 Encounters:  12/21/16 198 lb 12.8 oz (90.2 kg)  10/30/16 196 lb (88.9 kg)  09/18/16 198 lb 12.8 oz (90.2 kg)      Other studies Reviewed: Additional studies/ records that were reviewed today include: TTE  2015 Review of the above records today demonstrates:  - Left ventricle: The cavity size was normal. There was mild focal basal hypertrophy of the septum. Systolic function was normal. The estimated ejection fraction was in the range of 60% to 65%. Wall motion was normal; there were no regional wall motion abnormalities. - Aortic valve: Valve mobility was restricted. There was mild regurgitation. Mean gradient (S): 7 mm Hg. Peak gradient (S): 12 mm Hg. Valve area (VTI): 2.03 cm^2. Valve area (Vmean): 2.02 cm^2. - Mitral valve: Calcified annulus. Mildly thickened leaflets . The findings are consistent with mild stenosis. There was moderate regurgitation. Valve area by continuity equation (using LVOT flow): 2.29 cm^2. - Left atrium: The atrium was  mildly dilated. - Right ventricle: Systolic function was mildly reduced. - Right atrium: The atrium was mildly dilated.   ASSESSMENT AND PLAN:  1.  Atypical atrial flutter: On eliquis. Had AF ablation on 09/17/16. Has remained in sinus rhythm. Currently not requiring antiarrhythmics. No changes at this time.  This patients CHA2DS2-VASc Score and unadjusted Ischemic Stroke Rate (% per year) is equal to 4.8 % stroke rate/year from a score of 4  Above score calculated as 1 point each if present [CHF, HTN, DM, Vascular=MI/PAD/Aortic Plaque, Age if 65-74, or Male] Above score calculated as 2 points each if present [Age > 75, or Stroke/TIA/TE]  2. Coronary artery disease: No current chest pain. Continue current management.   Current medicines are reviewed at length with the patient today.   The patient does not have concerns regarding his medicines.  The following changes were made today:  none  Labs/ tests ordered today include: cbc, bmp Orders Placed This Encounter  Procedures  . EKG 12-Lead     Disposition:   FU with Will Camnitz 3 months  Signed, Will Meredith Leeds, MD  12/21/2016 10:12 AM     [facility] [address] [place] [address] [phone] (office) [phone] (fax)

## 2017-01-11 ENCOUNTER — Telehealth: Payer: Self-pay | Admitting: Cardiology

## 2017-01-11 ENCOUNTER — Other Ambulatory Visit: Payer: Self-pay | Admitting: Endocrinology

## 2017-01-11 DIAGNOSIS — J849 Interstitial pulmonary disease, unspecified: Secondary | ICD-10-CM

## 2017-01-11 NOTE — Telephone Encounter (Signed)
Pt asking for pulmonology referral based upon his CT results from May of this year. Informed that I would discuss w/ physician and let him know later this week. Patient verbalized understanding and agreeable to plan.

## 2017-01-11 NOTE — Telephone Encounter (Signed)
Last o/v was 04/01/16 1 canceled apy and no future scheduled- refill or refuse please advise

## 2017-01-11 NOTE — Telephone Encounter (Signed)
Matthew [name] is calling to let you know that he is ready for that referral to be made to the pulmonologist/ Please call .Marland Kitchen Thanks

## 2017-01-12 ENCOUNTER — Telehealth: Payer: Self-pay | Admitting: Endocrinology

## 2017-01-12 ENCOUNTER — Other Ambulatory Visit: Payer: Self-pay

## 2017-01-12 MED ORDER — INSULIN ASPART 100 UNIT/ML ~~LOC~~ SOLN: SUBCUTANEOUS | 0 refills | Status: DC

## 2017-01-12 NOTE — Telephone Encounter (Signed)
Routing to you °

## 2017-01-12 NOTE — Telephone Encounter (Signed)
Called patient and let him know that I am going to send a 30 day Rx for his Novolog and made him an appointment on 02/23/2017 at 10:15am but he is going to call back to schedule an earlier appointment ASAP since nothing was available at the time I called him. He would like to cancel the October appt once he is able to come in sooner.

## 2017-01-12 NOTE — Telephone Encounter (Signed)
Sending you this message so that you are aware. Thanks!

## 2017-01-12 NOTE — Telephone Encounter (Signed)
MEDICATION:  insulin aspart (NOVOLOG) 100 UNIT/ML injection    PHARMACY:   [id number] - [place], [place] - [place] [phone] (Phone) [phone] (Fax)   Patient states that the pharmacy will not fill. Please call pharmacy to fill medication and call and update patient once this has been done. Patient is almost out of the medication.

## 2017-01-12 NOTE — Telephone Encounter (Signed)
Need to have fasting labs before his visit

## 2017-01-13 NOTE — Telephone Encounter (Signed)
Informed pt Dr. Curt Bears was agreeable to pulmonology referral. Pt aware someone will contact him to schedule the referral. Advised to call office by the end of next week if he has not receive a phone call to schedule Patient verbalized understanding and agreeable to plan. He thanks me for helping.

## 2017-01-15 NOTE — Telephone Encounter (Signed)
Please schedule for fasting labs before his OV on 02/23/2017. Thank you!

## 2017-02-01 ENCOUNTER — Encounter: Payer: Self-pay | Admitting: Pulmonary Disease

## 2017-02-01 ENCOUNTER — Ambulatory Visit (INDEPENDENT_AMBULATORY_CARE_PROVIDER_SITE_OTHER): Payer: Medicare HMO | Admitting: Pulmonary Disease

## 2017-02-01 VITALS — BP 124/78 | HR 65 | Ht 66.0 in | Wt 199.2 lb

## 2017-02-01 DIAGNOSIS — J849 Interstitial pulmonary disease, unspecified: Secondary | ICD-10-CM

## 2017-02-01 DIAGNOSIS — R0683 Snoring: Secondary | ICD-10-CM

## 2017-02-01 NOTE — Patient Instructions (Signed)
Will schedule high resolution CT chest and pulmonary function test for January 2019  Follow up in January after tests are done

## 2017-02-01 NOTE — Progress Notes (Signed)
Past surgical history He  has a past surgical history that includes Elbow fracture surgery (Left); Cardiac catheterization (2011); Coronary artery bypass graft (05/2009); Cardioversion (N/A, 02/19/2014); Abdominal exploration surgery (~ 2005); Fracture surgery; Tonsillectomy (~ 1967); Testicle removal (Right, 1982); Cardiac catheterization (N/A, 10/31/2014); Cardioversion (N/A, 10/28/2015); Cardioversion (11/20/2015); Cardiac catheterization (N/A, 11/20/2015); and ATRIAL FIBRILLATION ABLATION (N/A, 09/17/2016).  Family history His family history includes Diabetes in his father, mother, and sister; Heart attack in his father; Heart disease in his father; Hypertension in his father.  Social history He  reports that he quit smoking about 28 years ago. His smoking use included Cigarettes. He smoked 0.00 packs per day for 15.00 years. He has never used smokeless tobacco. He reports that he drinks about 1.8 oz of alcohol per week . He reports that he does not use drugs.  Allergies  Allergen Reactions  . Actos [Pioglitazone] Swelling  . Bee Venom Swelling  . Codeine Hives and Nausea Only  . Statins Other (See Comments)    Muscle pain, fatigue  . Glucophage [Metformin Hcl] Nausea Only  . Raspberry Itching    Review of Systems  Constitutional: Negative for fever and unexpected weight change.  HENT: Positive for congestion. Negative for dental problem, ear pain, nosebleeds, postnasal drip, rhinorrhea, sinus pressure, sneezing, sore throat and trouble swallowing.   Eyes: Negative for redness and itching.  Respiratory: Positive for cough, chest tightness and shortness of breath. Negative for wheezing.   Cardiovascular: Positive for leg swelling. Negative for palpitations.  Gastrointestinal: Negative for nausea and vomiting.  Genitourinary: Negative for dysuria.  Musculoskeletal: Negative for joint swelling.  Skin: Negative for rash.  Allergic/Immunologic: Negative.  Negative for environmental allergies,  food allergies and immunocompromised state.  Neurological: Negative for headaches.  Hematological: Does not bruise/bleed easily.  Psychiatric/Behavioral: Negative for dysphoric mood. The patient is not nervous/anxious.     Current Outpatient Prescriptions on File Prior to Visit  Medication Sig  . BD PEN NEEDLE NANO U/F 32G X 4 MM MISC Use 3-4 per day to inject insulin  . bimatoprost (LUMIGAN) 0.01 % SOLN Place 1 drop into the left eye at bedtime.  . Coenzyme Q10 (CO Q 10) 100 MG CAPS Take 1 capsule by mouth daily.  Marland Kitchen ELIQUIS 5 MG TABS tablet TAKE 1 TABLET TWICE DAILY  . insulin aspart (NOVOLOG) 100 UNIT/ML injection Inject 12-15 units in the skin 3 times daily  . insulin NPH Human (NOVOLIN N) 100 UNIT/ML injection Inject 26 units in the skin daily (Patient taking differently: Inject 20-25 Units into the skin at bedtime. Dose depends on how much he ate that day.)  . KRILL OIL PO Take 1 capsule by mouth daily.  Marland Kitchen lisinopril (PRINIVIL,ZESTRIL) 5 MG tablet TAKE 1 TABLET EVERY DAY  . metoprolol tartrate (LOPRESSOR) 50 MG tablet TAKE 1/2 TABLET TWICE DAILY   No current facility-administered medications on file prior to visit.     Chief Complaint  Patient presents with  . Pulm Consult    Pt perferred by Dr. Meredith Leeds follow up from CT in May 2018. Pt has productive cough, coughs up clear phelgm and som SOB when sleeping. Pt is more congested in morning than during the day.    Cardiac tests Echo 08/21/16 >> mild LVH, EF 55 to 60%, grade 2 DD, mild MR  Past medical history He  has a past medical history of Anemia; Arthritis; CHF (congestive heart failure) ([place]); Chronic lower back pain; Coronary artery disease; Diabetic retinopathy ([place]); Glaucoma; Hyperlipidemia; Migraine;  Myocardial infarction ([place]); Paroxysmal atrial flutter ([place]); Pneumonia (1960); PTSD (post-traumatic stress disorder); Sacral pain; Sciatica of right side; Testicular cancer ([place]) (1982); Type II diabetes mellitus ([place]);  and Valvular heart disease.  Vital signs BP 124/78 (BP Location: Left Arm, Cuff Size: Normal)   Pulse 65   Ht 5\' 6"  (1.676 m)   Wt 199 lb 3.2 oz (90.4 kg)   SpO2 98%   BMI 32.15 kg/m   History of present illness Matthew [name] is a 71 y.o. male with remote history of smoking for evaluation of abnormal CT chest.  He has been followed by cardiology for a flutter.  He has undergone ablation 3 times.  He had CT chest looking at cardiac morphology in May 2018.  This showed mild prominence of interstitium and concern for ILD.  He has says that this was around the time he was having more trouble with arrhythmia.  He was having more trouble with cough, wheeze, and dyspnea then.  He had 3rd ablation and his symptoms improved.    Currently he gets mild throat congestion in the morning after waking up.  He coughs and clears his throat, and then he is okay.  He is not having wheeze, hemoptysis, fever, sweats, chest pain.  He denies sinus congestion, post nasal drip, sore throat, or reflux.  No history of allergies or asthma.  Never had PFT or allergy testing.  No history of connective tissue disease.  Denies skin rash, or joint pain.  He doesn't feel that his breathing limits his activity.  He is from [place].  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  Had pneumonia in the 1960's.  No history of tuberculosis.  No animal/bird exposures.    He does snore.  He is not aware about whether he has any other trouble with his breathing while asleep.  Physical exam  General - No distress Eyes - pupils reactive, wears glasses ENT - No sinus tenderness, no oral exudate, no LAN, no thyromegaly, TM clear, pupils equal/reactive, MP 4, scalloped tongue, low laying soft palate Cardiac - s1s2 regular, no murmur, pulses symmetric Chest - No wheeze/rales/dullness, good air entry, normal respiratory excursion Back - No focal tenderness Abd - Soft, non-tender, no organomegaly, +  bowel sounds Ext - No edema Neuro - Normal strength, cranial nerves intact Skin - No rashes Psych - Normal mood, and behavior   CMP Latest Ref Rng & Units 09/09/2016 08/14/2016 08/05/2016  Glucose 65 - 99 mg/dL 234(H) 181(H) 298(H)  BUN 8 - 27 mg/dL 26 21 32(H)  Creatinine 0.76 - 1.27 mg/dL 1.09 1.16 1.76(H)  Sodium 134 - 144 mmol/L 137 138 136  Potassium 3.5 - 5.2 mmol/L 5.0 5.3(H) 5.4(H)  Chloride 96 - 106 mmol/L 98 98 95(L)  CO2 18 - 29 mmol/L 23 25 27   Calcium 8.6 - 10.2 mg/dL 9.2 9.0 9.3  Total Protein 6.0 - 8.5 g/dL - - 6.6  Total Bilirubin 0.0 - 1.2 mg/dL - - 0.4  Alkaline Phos 39 - 117 IU/L - - 94  AST 0 - 40 IU/L - - 24  ALT 0 - 44 IU/L - - 35     CBC Latest Ref Rng & Units 09/09/2016 08/05/2016 11/15/2015  WBC 3.4 - 10.8 x10E3/uL 6.5 7.5 6.8  Hemoglobin 13.0 - 17.7 g/dL 12.2(L) 12.4(L) 12.3(L)  Hematocrit 37.5 - 51.0 % 36.4(L) 37.6 36.4(L)  Platelets 150 - 379 x10E3/uL 236 242 275     ABG  Component Value Date/Time   PHART 7.369 06/20/2009 1705   PCO2ART 44.2 06/20/2009 1705   PO2ART 124.0 (H) 06/20/2009 1705   HCO3 25.5 (H) 06/20/2009 1705   TCO2 26 06/20/2009 1818   O2SAT 99.0 06/20/2009 1705     Discussion 71 yo male with refractory atrial flutter who had CT chest imaging which showed changes that could be suggestive of early interstitial lung disease.  However, these findings could also be explained by diastolic dysfunction related to arrhythmia and fluid build up in his lungs as a result.  His symptoms have improved since his most recent ablation.  Having said this, it is still possible he could have very early interstitial lung disease.  He also reports snoring.  I discussed with him the association with sleep disordered breathing and arrhythmias.   Assessment/plan  Possible interstitial lung disease. - will arrange for high resolution CT chest and pulmonary function tests - he requested to defer these to January 2019 >> I think this would be okay    Snoring. - advised him to monitor his sleep pattern - advised him to d/w cardiology about whether a sleep study would be warranted   Patient Instructions  Will schedule high resolution CT chest and pulmonary function test for January 2019  Follow up in January after tests are done   Chesley Mires, MD [facility] Pager:  [phone] 02/01/2017, 9:44 AM

## 2017-02-01 NOTE — Telephone Encounter (Signed)
Pt is scheduled 10/8 with Dr. Halford Chessman

## 2017-02-01 NOTE — Progress Notes (Signed)
   Subjective:    Patient ID: Matthew [name], male    DOB: 1945-06-21, 71 y.o.   MRN: [id number]  HPI    Review of Systems  Constitutional: Negative for fever and unexpected weight change.  HENT: Positive for congestion. Negative for dental problem, ear pain, nosebleeds, postnasal drip, rhinorrhea, sinus pressure, sneezing, sore throat and trouble swallowing.   Eyes: Negative for redness and itching.  Respiratory: Positive for cough, chest tightness and shortness of breath. Negative for wheezing.   Cardiovascular: Positive for leg swelling. Negative for palpitations.  Gastrointestinal: Negative for nausea and vomiting.  Genitourinary: Negative for dysuria.  Musculoskeletal: Negative for joint swelling.  Skin: Negative for rash.  Allergic/Immunologic: Negative.  Negative for environmental allergies, food allergies and immunocompromised state.  Neurological: Negative for headaches.  Hematological: Does not bruise/bleed easily.  Psychiatric/Behavioral: Negative for dysphoric mood. The patient is not nervous/anxious.        Objective:   Physical Exam        Assessment & Plan:

## 2017-02-05 ENCOUNTER — Other Ambulatory Visit (INDEPENDENT_AMBULATORY_CARE_PROVIDER_SITE_OTHER): Payer: Medicare HMO

## 2017-02-05 ENCOUNTER — Other Ambulatory Visit: Payer: Self-pay | Admitting: Endocrinology

## 2017-02-05 DIAGNOSIS — Z794 Long term (current) use of insulin: Secondary | ICD-10-CM

## 2017-02-05 DIAGNOSIS — E1165 Type 2 diabetes mellitus with hyperglycemia: Secondary | ICD-10-CM | POA: Diagnosis not present

## 2017-02-05 DIAGNOSIS — D649 Anemia, unspecified: Secondary | ICD-10-CM

## 2017-02-05 DIAGNOSIS — E7800 Pure hypercholesterolemia, unspecified: Secondary | ICD-10-CM | POA: Diagnosis not present

## 2017-02-05 LAB — COMPREHENSIVE METABOLIC PANEL
ALT: 17 U/L (ref 0–53)
AST: 19 U/L (ref 0–37)
Albumin: 3.8 g/dL (ref 3.5–5.2)
Alkaline Phosphatase: 76 U/L (ref 39–117)
BUN: 22 mg/dL (ref 6–23)
CO2: 28 mEq/L (ref 19–32)
Calcium: 8.4 mg/dL (ref 8.4–10.5)
Chloride: 102 mEq/L (ref 96–112)
Creatinine, Ser: 1.12 mg/dL (ref 0.40–1.50)
GFR: 69.13 mL/min (ref >60.00)
Glucose, Bld: 252 mg/dL — ABNORMAL HIGH (ref 70–99)
Potassium: 4.9 mEq/L (ref 3.5–5.1)
Sodium: 136 mEq/L (ref 135–145)
Total Bilirubin: 0.4 mg/dL (ref 0.2–1.2)
Total Protein: 6.6 g/dL (ref 6.0–8.3)

## 2017-02-05 LAB — HEMOGLOBIN A1C: Hgb A1c MFr Bld: 8.6 % — ABNORMAL HIGH (ref 4.6–6.5)

## 2017-02-05 LAB — LIPID PANEL
Cholesterol: 224 mg/dL — ABNORMAL HIGH (ref 0–200)
HDL: 49.5 mg/dL (ref >39.00)
LDL Cholesterol: 145 mg/dL — ABNORMAL HIGH (ref 0–99)
NonHDL: 174.88
Total CHOL/HDL Ratio: 5
Triglycerides: 149 mg/dL (ref 0.0–149.0)
VLDL: 29.8 mg/dL (ref 0.0–40.0)

## 2017-02-05 LAB — CBC
HCT: 37.6 % — ABNORMAL LOW (ref 39.0–52.0)
Hemoglobin: 12.5 g/dL — ABNORMAL LOW (ref 13.0–17.0)
MCHC: 33.3 g/dL (ref 30.0–36.0)
MCV: 92.3 fl (ref 78.0–100.0)
Platelets: 248 10*3/uL (ref 150.0–400.0)
RBC: 4.08 Mil/uL — ABNORMAL LOW (ref 4.22–5.81)
RDW: 14.9 % (ref 11.5–15.5)
WBC: 6.4 10*3/uL (ref 4.0–10.5)

## 2017-02-10 ENCOUNTER — Ambulatory Visit (INDEPENDENT_AMBULATORY_CARE_PROVIDER_SITE_OTHER): Payer: Medicare HMO | Admitting: Endocrinology

## 2017-02-10 ENCOUNTER — Encounter: Payer: Self-pay | Admitting: Endocrinology

## 2017-02-10 VITALS — BP 132/68 | HR 88 | Ht 66.0 in | Wt 201.4 lb

## 2017-02-10 DIAGNOSIS — E782 Mixed hyperlipidemia: Secondary | ICD-10-CM | POA: Diagnosis not present

## 2017-02-10 DIAGNOSIS — E1165 Type 2 diabetes mellitus with hyperglycemia: Secondary | ICD-10-CM | POA: Diagnosis not present

## 2017-02-10 DIAGNOSIS — Z794 Long term (current) use of insulin: Secondary | ICD-10-CM | POA: Diagnosis not present

## 2017-02-10 DIAGNOSIS — Z23 Encounter for immunization: Secondary | ICD-10-CM | POA: Diagnosis not present

## 2017-02-10 NOTE — Patient Instructions (Signed)
Add at least 4-6 N insiulin in am  Then try 18-20 N at bedtime

## 2017-02-10 NOTE — Progress Notes (Signed)
Patient ID: Matthew [name], male   DOB: 09/23/1945, 71 y.o.   MRN: [id number]           Reason for Appointment: Followup for Type 2 Diabetes  Referring physician: Criss Rosales  History of Present Illness:          Diagnosis: Type 2 diabetes mellitus, date of diagnosis:  1992      Past history: His blood sugar was high at diagnosis when he was having a routine screening done. He was started on Glucophage initially which he took for about a year. He thinks it did not help his sugar much and he did not feel good with it Apparently he was trying to control his diabetes with diet and exercise for a few years. He may have tried Glucophage again before going on insulin. Also was started on Actos which caused swelling He has been treated mostly with premixed insulin for about 15 years and his level of control appears to be inadequate although details of previous treatment are not available. He may have taken Humalog at one time for use with high sugars A1c was 11.5 in 2011 On his initial consultation in 9/15 because of poor control with premixed insulin he was switched to Levemir twice a day and Humalog with meals.  Recent history:   INSULIN regimen is described as:20 N hs; Humalog 15-16 units with meals   In 11/17 his A1c was 9% and is now 8.6 He has not followed up as directed until now  The following recommendations had been made on his last visit: Start monitoring blood sugars at least once a day consistently at various times of the day including  after supper Take a consistent amount of 20 units of NPH at bedtime.   He can try using a carb ratio for about 1:4 or 5 to see if this would help consistently cover his meals, was given information on using an app on his phone to help carbohydrate counting Discussed postprandial blood sugar targets with starting carbohydrate counting. Needs to avoid high-fat meals but if he does he will need to take extra insulin after eating when blood sugar goes  up He was told to look into the freestyle [place] sensor coverage   Current management, blood sugar patterns and problems:  He has really not followed any of instructions given about his diabetes management above  Checking blood sugar very irregularly and did not bring his monitor now  He refuses to consider continuous glucose monitoring and he does not want any device attached to his body  His fasting blood sugars have been again reportedly somewhat variable although he does not remember exactly, he thinks the ranges are 65-130 and occasionally higher  He also says that he may occasionally feel sugar getting lower 3 AM  He had a high reading of over 200 midday when he had his labs without any breakfast that morning  He does not check his POSTPRANDIAL readings at night  Not adjusting his mealtime doses based on what he is eating   he again refuses to consider an insulin pump  With hypoglycemia he has symptoms of weakness, sweating, shakiness and confusion Treating low sugars with orange juice       Oral hypoglycemic drugs the patient is taking are: None      Side effects from medications have been: Edema from Actos,? Nausea and malaise from metformin Compliance with the medical regimen: Fair  Glucose monitoring:  done   one time a day  Glucometer: One Touch.      Blood Glucose readings as above     Self-care:   Meals: 3 meals per day.  Bfst 8:30-9, 1 pm Dinner is about 8 PM.  Some meals are high fat      Exercise: playing Racquetball about 1 day a week          Dietician visit, most recent:?  15 years ago.     CDE visit: 3/16            Weight history:  Wt Readings from Last 3 Encounters:  02/10/17 201 lb 6.4 oz (91.4 kg)  02/01/17 199 lb 3.2 oz (90.4 kg)  12/21/16 198 lb 12.8 oz (90.2 kg)    Glycemic control:   Lab Results  Component Value Date   HGBA1C 8.6 (H) 02/05/2017   HGBA1C 9.0 (H) 03/26/2016   HGBA1C 9.0 (H) 10/07/2015   Lab Results  Component  Value Date   MICROALBUR 3.4 (H) 07/18/2015   LDLCALC 145 (H) 02/05/2017   CREATININE 1.12 02/05/2017    Lab on 02/05/2017  Component Date Value Ref Range Status  . Hgb A1c MFr Bld 02/05/2017 8.6* 4.6 - 6.5 % Final   Glycemic Control Guidelines for People with Diabetes:Non Diabetic:  <6%Goal of Therapy: <7%Additional Action Suggested:  >8%   . Sodium 02/05/2017 136  135 - 145 mEq/L Final  . Potassium 02/05/2017 4.9  3.5 - 5.1 mEq/L Final  . Chloride 02/05/2017 102  96 - 112 mEq/L Final  . CO2 02/05/2017 28  19 - 32 mEq/L Final  . Glucose, Bld 02/05/2017 252* 70 - 99 mg/dL Final  . BUN 02/05/2017 22  6 - 23 mg/dL Final  . Creatinine, Ser 02/05/2017 1.12  0.40 - 1.50 mg/dL Final  . Total Bilirubin 02/05/2017 0.4  0.2 - 1.2 mg/dL Final  . Alkaline Phosphatase 02/05/2017 76  39 - 117 U/L Final  . AST 02/05/2017 19  0 - 37 U/L Final  . ALT 02/05/2017 17  0 - 53 U/L Final  . Total Protein 02/05/2017 6.6  6.0 - 8.3 g/dL Final  . Albumin 02/05/2017 3.8  3.5 - 5.2 g/dL Final  . Calcium 02/05/2017 8.4  8.4 - 10.5 mg/dL Final  . GFR 02/05/2017 69.13  >60.00 mL/min Final  . Cholesterol 02/05/2017 224* 0 - 200 mg/dL Final   ATP III Classification       Desirable:  < 200 mg/dL               Borderline High:  200 - 239 mg/dL          High:  > = 240 mg/dL  . Triglycerides 02/05/2017 149.0  0.0 - 149.0 mg/dL Final   Normal:  <150 mg/dLBorderline High:  150 - 199 mg/dL  . HDL 02/05/2017 49.50  >39.00 mg/dL Final  . VLDL 02/05/2017 29.8  0.0 - 40.0 mg/dL Final  . LDL Cholesterol 02/05/2017 145* 0 - 99 mg/dL Final  . Total CHOL/HDL Ratio 02/05/2017 5   Final                  Men          Women1/2 Average Risk     3.4          3.3Average Risk          5.0          4.42X Average Risk          9.6  7.13X Average Risk          15.0          11.0                      . NonHDL 02/05/2017 174.88   Final   NOTE:  Non-HDL goal should be 30 mg/dL higher than patient's LDL goal (i.e. LDL goal of < 70  mg/dL, would have non-HDL goal of < 100 mg/dL)  . WBC 02/05/2017 6.4  4.0 - 10.5 K/uL Final  . RBC 02/05/2017 4.08* 4.22 - 5.81 Mil/uL Final  . Platelets 02/05/2017 248.0  150.0 - 400.0 K/uL Final  . Hemoglobin 02/05/2017 12.5* 13.0 - 17.0 g/dL Final  . HCT 02/05/2017 37.6* 39.0 - 52.0 % Final  . MCV 02/05/2017 92.3  78.0 - 100.0 fl Final  . MCHC 02/05/2017 33.3  30.0 - 36.0 g/dL Final  . RDW 02/05/2017 14.9  11.5 - 15.5 % Final      Allergies as of 02/10/2017      Reactions   Actos [pioglitazone] Swelling   Bee Venom Swelling   Codeine Hives, Nausea Only   Statins Other (See Comments)   Muscle pain, fatigue   Glucophage [metformin Hcl] Nausea Only   Raspberry Itching      Medication List       Accurate as of 02/10/17  9:33 PM. Always use your most recent med list.          BD PEN NEEDLE NANO U/F 32G X 4 MM Misc Generic drug:  Insulin Pen Needle Use 3-4 per day to inject insulin   bimatoprost 0.01 % Soln Commonly known as:  LUMIGAN Place 1 drop into the left eye at bedtime.   Co Q 10 100 MG Caps Take 1 capsule by mouth daily.   ELIQUIS 5 MG Tabs tablet Generic drug:  apixaban TAKE 1 TABLET TWICE DAILY   insulin aspart 100 UNIT/ML injection Commonly known as:  NOVOLOG Inject 12-15 units in the skin 3 times daily   insulin NPH Human 100 UNIT/ML injection Commonly known as:  NOVOLIN N Inject 26 units in the skin daily   KRILL OIL PO Take 1 capsule by mouth daily.   lisinopril 5 MG tablet Commonly known as:  PRINIVIL,ZESTRIL TAKE 1 TABLET EVERY DAY   metoprolol tartrate 50 MG tablet Commonly known as:  LOPRESSOR TAKE 1/2 TABLET TWICE DAILY       Allergies:  Allergies  Allergen Reactions  . Actos [Pioglitazone] Swelling  . Bee Venom Swelling  . Codeine Hives and Nausea Only  . Statins Other (See Comments)    Muscle pain, fatigue  . Glucophage [Metformin Hcl] Nausea Only  . Raspberry Itching    Past Medical History:  Diagnosis Date  .  Anemia   . Arthritis    "back, left ankle" (11/20/2015)  . CHF (congestive heart failure) ([place])   . Chronic lower back pain   . Coronary artery disease    a. 05/2009 CABG x 3: LIMA->LAD, VG->OM, VG->PDA; b. Nuc 01/2014: inf-lat scar but no ischemia, EF 57%.  . Diabetic retinopathy (HCC)    mild- Dr. Herbert Deaner  . Glaucoma   . Hyperlipidemia    a. Intolerant of lipitor and vytorin.  . Migraine    "once or twice" (11/20/2015)  . Myocardial infarction [facility])    'saw evidence of it on an EKG done in 2005"  . Paroxysmal atrial flutter ([place])    a. 01/2014 s/p DCCV;  b. CHA2DS2VASc = 3-->Eliquis.  . Pneumonia 1960  . PTSD (post-traumatic stress disorder)    "X 2 yr post OHS" (10/31/2014)  . Sacral pain    "right"  . Sciatica of right side   . Testicular cancer ([place]) 1982   "heavy doses of radiation; it was stage 2"  . Type II diabetes mellitus ([place])   . Valvular heart disease    a. 01/2014 Echo: Ef 60-65%, no rwma, mild AI/MS, mod MR, mildly dil LA.    Past Surgical History:  Procedure Laterality Date  . ABDOMINAL EXPLORATION SURGERY  ~ 2005   "for hernia, but didn't have one"  . ATRIAL FIBRILLATION ABLATION N/A 09/17/2016   Procedure: Atrial Fibrillation Ablation;  Surgeon: [name], MD;  Location: [place] CV LAB;  Service: Cardiovascular;  Laterality: N/A;  . CARDIAC CATHETERIZATION  2011  . CARDIOVERSION N/A 02/19/2014   Procedure: CARDIOVERSION;  Surgeon: [name], MD;  Location: [facility] ENDOSCOPY;  Service: Cardiovascular;  Laterality: N/A;  . CARDIOVERSION N/A 10/28/2015   Procedure: CARDIOVERSION;  Surgeon: [name] Headings, MD;  Location: [facility] ENDOSCOPY;  Service: Cardiovascular;  Laterality: N/A;  . CARDIOVERSION  11/20/2015   "200 joules"  . CORONARY ARTERY BYPASS GRAFT  05/2009   LIMA-LAD, SVG-OM, SVG-PDA 06/20/09  . ELBOW FRACTURE SURGERY Left    broken ulna on left elbow-surgical repair  . ELECTROPHYSIOLOGIC STUDY N/A 10/31/2014   Procedure: A-Flutter;   Surgeon: [name], MD;  Location: [place] CV LAB;  Service: Cardiovascular;  Laterality: N/A;  . ELECTROPHYSIOLOGIC STUDY N/A 11/20/2015   Procedure: A-Flutter Ablation;  Surgeon: [name], MD;  Location: [place] CV LAB;  Service: Cardiovascular;  Laterality: N/A;  . FRACTURE SURGERY    . TESTICLE REMOVAL Right 1982  . TONSILLECTOMY  ~ 72    Family History  Problem Relation Age of Onset  . Diabetes Mother   . Heart disease Father   . Heart attack Father   . Diabetes Father   . Hypertension Father   . Diabetes Sister     Social History:  reports that he quit smoking about 28 years ago. His smoking use included Cigarettes. He smoked 0.00 packs per day for 15.00 years. He has never used smokeless tobacco. He reports that he drinks about 1.8 oz of alcohol per week . He reports that he does not use drugs.    Review of Systems         Lipids: He was previously on Vytorin and Lipitor. He thinks he had myalgias and weakness when taking these drugs.  He has been recommended statin drugs in the lipid clinic but he refuses to take them stating that they will not help him.   Also has significantly increased LDL particle number   he has been  Persistently reluctant to take  Any medications at all includig Zetia     Lab Results  Component Value Date   CHOL 224 (H) 02/05/2017   HDL 49.50 02/05/2017   LDLCALC 145 (H) 02/05/2017   TRIG 149.0 02/05/2017   CHOLHDL 5 02/05/2017    He has been  treated by cardiologist for his atrial fibrillation  Mild anemia:  Lab Results  Component Value Date   WBC 6.4 02/05/2017   HGB 12.5 (L) 02/05/2017   HCT 37.6 (L) 02/05/2017   MCV 92.3 02/05/2017   PLT 248.0 02/05/2017      Physical Examination:  BP 132/68   Pulse 88   Ht 5'  6" (1.676 m)   Wt 201 lb 6.4 oz (91.4 kg)   SpO2 97%   BMI 32.51 kg/m     ASSESSMENT:  Diabetes type 2, uncontrolled  He has had long-standing diabetes on insulin for over 15 years  with usually poor control  See history of present illness for details of his current management   And problems identified in blood sugar patterns   He continues to have poor compliance with instructions for his diabetes management and A1c is still high  around 9%  As before  Factors responsible for high sugars including He still reluctant to change his lifestyle including high-fat diet, inconsistent carbohydrate intake Does not monitor blood sugars as directed also Ideally he should be on an insulin pump but he is completely refusing to consider this Also previously had refused the freestyle [place] system he is somewhat open to looking at this and discussed how this works and information provided that will help [place] adjust his insulin  Currently appears to be having at trend towards higher readings midmorning and will probably early afternoon without adequate basal insulin Do not know what his blood sugars are after meals and difficult to adjust his mealtime dose He will also not use carbohydrate counting as basis for mealtime insulin consultation as discussed before  HYPERLIPIDEMIA: His lipids are worse, possibly related to inconsistent diet However he is still at high risk for cardio vascular disease and needs lipid-lowering including pharmacological treatment and this was discussed Since he is refusing to take statin drugs he was advised about taking Zetia at least which would be nonsystemic and safe He is refusing to consider this also but did agree to review the literature on this that was given today  Mild chronic anemia: He had requested a CBC and results given to him, he'll follow-up with PCP  PLAN:  Start monitoring blood sugars regularly and bring monitor for download Look into the insurance coverage for the freestyle [place] He will start adding 4-6 units of NPH in the morning mixed with his Humalog and increase this if blood sugars are still high at midday More blood sugars after  supper Improve diet with low fat intake Call if he is agreeing to start Zetia More regular follow-up for diabetes management Recheck lipids on the next visit   Influenza vaccine given   Patient Instructions  Add at least 4-6 N insiulin in am  Then try 18-20 N at bedtime    Counseling time on subjects discussed in assessment and plan sections is over 50% of today's 25 minute visit      [facility] 02/10/2017, 9:33 PM   Note: This office note was prepared with Dragon voice recognition system technology. Any transcriptional errors that result from this process are unintentional.

## 2017-02-11 ENCOUNTER — Other Ambulatory Visit: Payer: Self-pay | Admitting: Endocrinology

## 2017-02-11 DIAGNOSIS — Z794 Long term (current) use of insulin: Secondary | ICD-10-CM

## 2017-02-11 DIAGNOSIS — E1165 Type 2 diabetes mellitus with hyperglycemia: Secondary | ICD-10-CM

## 2017-02-11 LAB — URINALYSIS, ROUTINE W REFLEX MICROSCOPIC
Bilirubin Urine: NEGATIVE
Hgb urine dipstick: NEGATIVE
Ketones, ur: NEGATIVE
Leukocytes, UA: NEGATIVE
Nitrite: NEGATIVE
RBC / HPF: NONE SEEN (ref 0–?)
Specific Gravity, Urine: 1.02 (ref 1.000–1.030)
Total Protein, Urine: NEGATIVE
Urine Glucose: NEGATIVE
Urobilinogen, UA: 0.2 (ref 0.0–1.0)
WBC, UA: NONE SEEN (ref 0–?)
pH: 5.5 (ref 5.0–8.0)

## 2017-02-11 LAB — MICROALBUMIN / CREATININE URINE RATIO
Creatinine,U: 132.6 mg/dL
Microalb Creat Ratio: 4 mg/g (ref 0.0–30.0)
Microalb, Ur: 5.3 mg/dL — ABNORMAL HIGH (ref 0.0–1.9)

## 2017-02-11 NOTE — Addendum Note (Signed)
Addended by: Kaylyn Lim I on: 02/11/2017 11:37 AM   Modules accepted: Orders

## 2017-02-18 ENCOUNTER — Other Ambulatory Visit: Payer: Medicare HMO

## 2017-02-23 ENCOUNTER — Ambulatory Visit: Payer: Medicare HMO | Admitting: Endocrinology

## 2017-03-10 ENCOUNTER — Other Ambulatory Visit: Payer: Medicare HMO

## 2017-03-11 ENCOUNTER — Other Ambulatory Visit: Payer: Self-pay | Admitting: Endocrinology

## 2017-03-23 ENCOUNTER — Encounter: Payer: Self-pay | Admitting: Cardiology

## 2017-03-23 ENCOUNTER — Ambulatory Visit: Payer: Medicare HMO | Admitting: Cardiology

## 2017-03-23 VITALS — BP 138/80 | HR 71 | Ht 66.0 in | Wt 206.6 lb

## 2017-03-23 DIAGNOSIS — I484 Atypical atrial flutter: Secondary | ICD-10-CM | POA: Diagnosis not present

## 2017-03-23 DIAGNOSIS — I2510 Atherosclerotic heart disease of native coronary artery without angina pectoris: Secondary | ICD-10-CM

## 2017-03-23 NOTE — Patient Instructions (Signed)

## 2017-03-23 NOTE — Progress Notes (Signed)
Electrophysiology Office Note   Date:  03/23/2017   ID:  Matthew [name], DOB 1946-01-10, MRN [id number]  PCP:  [name], MD  Cardiologist:  Lovena Le Primary Electrophysiologist: [name], MD    Chief Complaint  Patient presents with  . Follow-up    Atypical AFlutter     History of Present Illness: Matthew [name] is a 71 y.o. male who is being seen today for the evaluation of atrial flutter at the request of [name], MD. Presenting today for electrophysiology evaluation. history of CAD hx of CABG 2011, AFlutter ablated, recurrent AFlutter with atypical morphology >> EPS noted LA flutter his atrial flutter isthmus was blocked, initially on Tikosyn with prolongation of his QT eventually on amiodarone, DM.  He was put on the amiodarone he felt tired, poor, though with reduction of the dose he did feel well.  Had ablation of typical atrial flutter on 10/31/14.  He subsequently developed a left atrial flutter and had ablation on 09/17/16 for atrial fibrillation and left atrial flutter.  Today, denies symptoms of palpitations, chest pain, shortness of breath, orthopnea, PND, lower extremity edema, claudication, dizziness, presyncope, syncope, bleeding, or neurologic sequela. The patient is tolerating medications without difficulties.  He has been feeling well without any complaints.  He does have back pain that has kept him from doing some of his activities including playing racquetball.  He is currently going to a Restaurant manager, fast food.   Past Medical History:  Diagnosis Date  . Anemia   . Arthritis    "back, left ankle" (11/20/2015)  . CHF (congestive heart failure) ([place])   . Chronic lower back pain   . Coronary artery disease    a. 05/2009 CABG x 3: LIMA->LAD, VG->OM, VG->PDA; b. Nuc 01/2014: inf-lat scar but no ischemia, EF 57%.  . Diabetic retinopathy (HCC)    mild- Dr. Herbert Deaner  . Glaucoma   . Hyperlipidemia    a. Intolerant of lipitor and vytorin.  . Migraine      "once or twice" (11/20/2015)  . Myocardial infarction [facility])    'saw evidence of it on an EKG done in 2005"  . Paroxysmal atrial flutter ([place])    a. 01/2014 s/p DCCV;  b. CHA2DS2VASc = 3-->Eliquis.  . Pneumonia 1960  . PTSD (post-traumatic stress disorder)    "X 2 yr post OHS" (10/31/2014)  . Sacral pain    "right"  . Sciatica of right side   . Testicular cancer ([place]) 1982   "heavy doses of radiation; it was stage 2"  . Type II diabetes mellitus ([place])   . Valvular heart disease    a. 01/2014 Echo: Ef 60-65%, no rwma, mild AI/MS, mod MR, mildly dil LA.   Past Surgical History:  Procedure Laterality Date  . ABDOMINAL EXPLORATION SURGERY  ~ 2005   "for hernia, but didn't have one"  . ATRIAL FIBRILLATION ABLATION N/A 09/17/2016   Procedure: Atrial Fibrillation Ablation;  Surgeon: [name], MD;  Location: [place] CV LAB;  Service: Cardiovascular;  Laterality: N/A;  . CARDIAC CATHETERIZATION  2011  . CARDIOVERSION N/A 02/19/2014   Procedure: CARDIOVERSION;  Surgeon: [name], MD;  Location: [facility] ENDOSCOPY;  Service: Cardiovascular;  Laterality: N/A;  . CARDIOVERSION N/A 10/28/2015   Procedure: CARDIOVERSION;  Surgeon: [name], MD;  Location: [facility] ENDOSCOPY;  Service: Cardiovascular;  Laterality: N/A;  . CARDIOVERSION  11/20/2015   "200 joules"  . CORONARY ARTERY BYPASS GRAFT  05/2009   LIMA-LAD, SVG-OM, SVG-PDA  06/20/09  . ELBOW FRACTURE SURGERY Left    broken ulna on left elbow-surgical repair  . ELECTROPHYSIOLOGIC STUDY N/A 10/31/2014   Procedure: A-Flutter;  Surgeon: [name], MD;  Location: [place] CV LAB;  Service: Cardiovascular;  Laterality: N/A;  . ELECTROPHYSIOLOGIC STUDY N/A 11/20/2015   Procedure: A-Flutter Ablation;  Surgeon: [name], MD;  Location: [place] CV LAB;  Service: Cardiovascular;  Laterality: N/A;  . FRACTURE SURGERY    . TESTICLE REMOVAL Right 1982  . TONSILLECTOMY  ~ 1967     Current Outpatient Medications   Medication Sig Dispense Refill  . BD PEN NEEDLE NANO U/F 32G X 4 MM MISC Use 3-4 per day to inject insulin 200 each 1  . bimatoprost (LUMIGAN) 0.01 % SOLN Place 1 drop into the left eye at bedtime.    . Coenzyme Q10 (CO Q 10) 100 MG CAPS Take 1 capsule by mouth daily. 90 capsule 1  . ELIQUIS 5 MG TABS tablet TAKE 1 TABLET TWICE DAILY 180 tablet 2  . insulin NPH Human (NOVOLIN N) 100 UNIT/ML injection Inject 26 units in the skin daily (Patient taking differently: Inject 20-25 Units into the skin at bedtime. Dose depends on how much he ate that day.) 10 mL 11  . KRILL OIL PO Take 1 capsule by mouth daily.    [place] Kitchen lisinopril (PRINIVIL,ZESTRIL) 5 MG tablet TAKE 1 TABLET EVERY DAY 90 tablet 2  . metoprolol tartrate (LOPRESSOR) 50 MG tablet TAKE 1/2 TABLET TWICE DAILY 90 tablet 1  . NOVOLOG 100 UNIT/ML injection INJECT 12-15 UNITS IN THE SKIN 3 TIMES DAILY 10 mL 3   No current facility-administered medications for this visit.     Allergies:   Actos [pioglitazone]; Bee venom; Codeine; Statins; Glucophage [metformin hcl]; and Raspberry   Social History:  The patient  reports that he quit smoking about 28 years ago. His smoking use included cigarettes. He smoked 0.00 packs per day for 15.00 years. he has never used smokeless tobacco. He reports that he drinks about 1.8 oz of alcohol per week. He reports that he does not use drugs.   Family History:  The patient's family history includes Diabetes in his father, mother, and sister; Heart attack in his father; Heart disease in his father; Hypertension in his father.    ROS:  Please see the history of present illness.   Otherwise, review of systems is positive for back pain.   All other systems are reviewed and negative.   PHYSICAL EXAM: VS:  BP 138/80   Pulse 71   Ht 5\' 6"  (1.676 m)   Wt 206 lb 9.6 oz (93.7 kg)   SpO2 99%   BMI 33.35 kg/m  , BMI Body mass index is 33.35 kg/m. GEN: Well nourished, well developed, in no acute distress  HEENT:  normal  Neck: no JVD, carotid bruits, or masses Cardiac: RRR; no murmurs, rubs, or gallops,no edema  Respiratory:  clear to auscultation bilaterally, normal work of breathing GI: soft, nontender, nondistended, + BS MS: no deformity or atrophy  Skin: warm and dry Neuro:  Strength and sensation are intact Psych: euthymic mood, full affect  EKG:  EKG is not ordered today. Personal review of the ekg ordered 12/21/16 shows SR, RBBB, IMI (old)   Recent Labs: 08/05/2016: TSH 2.340 02/05/2017: ALT 17; BUN 22; Creatinine, Ser 1.12; Hemoglobin 12.5; Platelets 248.0; Potassium 4.9; Sodium 136    Lipid Panel     Component Value Date/Time   CHOL 224 (  H) 02/05/2017 1143   CHOL 216 (H) 06/21/2013 1405   TRIG 149.0 02/05/2017 1143   TRIG 132 06/21/2013 1405   HDL 49.50 02/05/2017 1143   HDL 54 06/21/2013 1405   CHOLHDL 5 02/05/2017 1143   VLDL 29.8 02/05/2017 1143   LDLCALC 145 (H) 02/05/2017 1143   LDLCALC 136 (H) 06/21/2013 1405     Wt Readings from Last 3 Encounters:  03/23/17 206 lb 9.6 oz (93.7 kg)  02/10/17 201 lb 6.4 oz (91.4 kg)  02/01/17 199 lb 3.2 oz (90.4 kg)      Other studies Reviewed: Additional studies/ records that were reviewed today include: TTE  08/21/16 Review of the above records today demonstrates:  - Left ventricle: The cavity size was normal. Wall thickness was   increased in a pattern of mild LVH. Systolic function was normal.   The estimated ejection fraction was in the range of 55% to 60%.   Wall motion was normal; there were no regional wall motion   abnormalities. Features are consistent with a pseudonormal left   ventricular filling pattern, with concomitant abnormal relaxation   and increased filling pressure (grade 2 diastolic dysfunction).   Doppler parameters are consistent with high ventricular filling   pressure. - Mitral valve: Severely calcified annulus. There was mild   regurgitation. - Left atrium: The atrium was mildly  dilated.   ASSESSMENT AND PLAN:  1.  Atypical atrial flutter: Had a of ablation on 09/17/16.  Currently on Eliquis.  Is remained in sinus rhythm.  Not on antiarrhythmics.  No changes at this time.  On eliquis.   This patients CHA2DS2-VASc Score and unadjusted Ischemic Stroke Rate (% per year) is equal to 4.8 % stroke rate/year from a score of 4  Above score calculated as 1 point each if present [CHF, HTN, DM, Vascular=MI/PAD/Aortic Plaque, Age if 65-74, or Male] Above score calculated as 2 points each if present [Age > 75, or Stroke/TIA/TE]  2. Coronary artery disease: No current chest pain.  Continue current management.  Current medicines are reviewed at length with the patient today.   The patient does not have concerns regarding his medicines.  The following changes were made today: None  Labs/ tests ordered today include: cbc, bmp No orders of the defined types were placed in this encounter.    Disposition:   FU with Will Camnitz 6 months  Signed, Will Meredith Leeds, MD  03/23/2017 9:48 AM     [facility] [address] [place] [address] [phone] (office) [phone] (fax)

## 2017-04-08 ENCOUNTER — Telehealth: Payer: Self-pay | Admitting: Pulmonary Disease

## 2017-04-08 NOTE — Telephone Encounter (Signed)
VS ----Matthew [name] called and stated that the pt cancelled his CT appt and does not want to reschedule this.  Just FYI---

## 2017-04-08 NOTE — Telephone Encounter (Signed)
Noted  

## 2017-04-24 ENCOUNTER — Other Ambulatory Visit: Payer: Self-pay | Admitting: Endocrinology

## 2017-05-03 ENCOUNTER — Inpatient Hospital Stay: Admission: RE | Admit: 2017-05-03 | Payer: Medicare HMO | Source: Ambulatory Visit

## 2017-06-08 ENCOUNTER — Other Ambulatory Visit: Payer: Medicare HMO

## 2017-06-08 ENCOUNTER — Other Ambulatory Visit: Payer: Self-pay | Admitting: Internal Medicine

## 2017-06-08 DIAGNOSIS — I483 Typical atrial flutter: Secondary | ICD-10-CM

## 2017-06-15 ENCOUNTER — Ambulatory Visit: Payer: Medicare HMO | Admitting: Endocrinology

## 2017-06-25 ENCOUNTER — Other Ambulatory Visit: Payer: Self-pay | Admitting: Endocrinology

## 2017-07-05 ENCOUNTER — Other Ambulatory Visit: Payer: Medicare HMO

## 2017-07-06 ENCOUNTER — Other Ambulatory Visit (INDEPENDENT_AMBULATORY_CARE_PROVIDER_SITE_OTHER): Payer: Medicare HMO

## 2017-07-06 DIAGNOSIS — Z794 Long term (current) use of insulin: Secondary | ICD-10-CM

## 2017-07-06 DIAGNOSIS — E782 Mixed hyperlipidemia: Secondary | ICD-10-CM | POA: Diagnosis not present

## 2017-07-06 DIAGNOSIS — E1165 Type 2 diabetes mellitus with hyperglycemia: Secondary | ICD-10-CM

## 2017-07-06 LAB — COMPREHENSIVE METABOLIC PANEL
ALT: 23 U/L (ref 0–53)
AST: 28 U/L (ref 0–37)
Albumin: 3.7 g/dL (ref 3.5–5.2)
Alkaline Phosphatase: 68 U/L (ref 39–117)
BUN: 19 mg/dL (ref 6–23)
CO2: 31 mEq/L (ref 19–32)
Calcium: 9.5 mg/dL (ref 8.4–10.5)
Chloride: 100 mEq/L (ref 96–112)
Creatinine, Ser: 1.03 mg/dL (ref 0.40–1.50)
GFR: 76.06 mL/min (ref >60.00)
Glucose, Bld: 158 mg/dL — ABNORMAL HIGH (ref 70–99)
Potassium: 4.4 mEq/L (ref 3.5–5.1)
Sodium: 137 mEq/L (ref 135–145)
Total Bilirubin: 0.5 mg/dL (ref 0.2–1.2)
Total Protein: 6.6 g/dL (ref 6.0–8.3)

## 2017-07-06 LAB — HEMOGLOBIN A1C: Hgb A1c MFr Bld: 9.2 % — ABNORMAL HIGH (ref 4.6–6.5)

## 2017-07-06 LAB — LIPID PANEL
Cholesterol: 227 mg/dL — ABNORMAL HIGH (ref 0–200)
HDL: 54 mg/dL (ref >39.00)
LDL Cholesterol: 153 mg/dL — ABNORMAL HIGH (ref 0–99)
NonHDL: 173.18
Total CHOL/HDL Ratio: 4
Triglycerides: 101 mg/dL (ref 0.0–149.0)
VLDL: 20.2 mg/dL (ref 0.0–40.0)

## 2017-07-08 ENCOUNTER — Encounter: Payer: Self-pay | Admitting: Endocrinology

## 2017-07-08 ENCOUNTER — Encounter: Payer: Medicare HMO | Admitting: Endocrinology

## 2017-07-11 NOTE — Progress Notes (Signed)
This encounter was created in error - please disregard.

## 2017-07-23 ENCOUNTER — Other Ambulatory Visit: Payer: Self-pay | Admitting: Interventional Cardiology

## 2017-08-11 ENCOUNTER — Other Ambulatory Visit: Payer: Self-pay | Admitting: Endocrinology

## 2017-08-17 ENCOUNTER — Encounter: Payer: Self-pay | Admitting: Cardiology

## 2017-08-17 ENCOUNTER — Ambulatory Visit: Payer: Medicare HMO | Admitting: Cardiology

## 2017-08-17 VITALS — BP 128/70 | HR 82 | Ht 66.0 in | Wt 205.0 lb

## 2017-08-17 DIAGNOSIS — I484 Atypical atrial flutter: Secondary | ICD-10-CM

## 2017-08-17 DIAGNOSIS — I2510 Atherosclerotic heart disease of native coronary artery without angina pectoris: Secondary | ICD-10-CM | POA: Diagnosis not present

## 2017-08-17 NOTE — Progress Notes (Signed)
Electrophysiology Office Note   Date:  08/17/2017   ID:  Matthew [name], DOB [birth year], MRN [id number]  PCP:  [name], MD  Cardiologist:  Lovena Le Primary Electrophysiologist: [name], MD    Chief Complaint  Patient presents with  . Follow-up    Typical AFlutter/PAF     History of Present Illness: Matthew [name] is a 72 y.o. male who is being seen today for the evaluation of atrial flutter at the request of [name], MD. Presenting today for electrophysiology evaluation. history of CAD hx of CABG 2011, AFlutter ablated, recurrent AFlutter with atypical morphology >> EPS noted LA flutter his atrial flutter isthmus was blocked, initially on Tikosyn with prolongation of his QT eventually on amiodarone, DM.  He was put on the amiodarone he felt tired, poor, though with reduction of the dose he did feel well.  Had ablation of typical atrial flutter on 10/31/14.  He subsequently developed a left atrial flutter and had ablation on 09/17/16 for atrial fibrillation and left atrial flutter.  Today, denies symptoms of palpitations, chest pain, shortness of breath, orthopnea, PND, lower extremity edema, claudication, dizziness, presyncope, syncope, bleeding, or neurologic sequela. The patient is tolerating medications without difficulties.  Overall he is feeling well.  He has noted occasional episodes of palpitations mainly at night when he is laying on his left side.  He rolled to his right side and notices no further palpitations.   Past Medical History:  Diagnosis Date  . Anemia   . Arthritis    "back, left ankle" (11/20/2015)  . CHF (congestive heart failure) ([place])   . Chronic lower back pain   . Coronary artery disease    a. 05/2009 CABG x 3: LIMA->LAD, VG->OM, VG->PDA; b. Nuc 01/2014: inf-lat scar but no ischemia, EF 57%.  . Diabetic retinopathy (HCC)    mild- Dr. Herbert Deaner  . Glaucoma   . Hyperlipidemia    a. Intolerant of lipitor and vytorin.  . Migraine      "once or twice" (11/20/2015)  . Myocardial infarction [facility])    'saw evidence of it on an EKG done in 2005"  . Paroxysmal atrial flutter ([place])    a. 01/2014 s/p DCCV;  b. CHA2DS2VASc = 3-->Eliquis.  . Pneumonia 1960  . PTSD (post-traumatic stress disorder)    "X 2 yr post OHS" (10/31/2014)  . Sacral pain    "right"  . Sciatica of right side   . Testicular cancer ([place]) 1982   "heavy doses of radiation; it was stage 2"  . Type II diabetes mellitus ([place])   . Valvular heart disease    a. 01/2014 Echo: Ef 60-65%, no rwma, mild AI/MS, mod MR, mildly dil LA.   Past Surgical History:  Procedure Laterality Date  . ABDOMINAL EXPLORATION SURGERY  ~ 2005   "for hernia, but didn't have one"  . ATRIAL FIBRILLATION ABLATION N/A 09/17/2016   Procedure: Atrial Fibrillation Ablation;  Surgeon: [name], MD;  Location: [place] CV LAB;  Service: Cardiovascular;  Laterality: N/A;  . CARDIAC CATHETERIZATION  2011  . CARDIOVERSION N/A 02/19/2014   Procedure: CARDIOVERSION;  Surgeon: [name], MD;  Location: [facility] ENDOSCOPY;  Service: Cardiovascular;  Laterality: N/A;  . CARDIOVERSION N/A 10/28/2015   Procedure: CARDIOVERSION;  Surgeon: [name], MD;  Location: [facility] ENDOSCOPY;  Service: Cardiovascular;  Laterality: N/A;  . CARDIOVERSION  11/20/2015   "200 joules"  . CORONARY ARTERY BYPASS GRAFT  05/2009   LIMA-LAD, SVG-OM,  SVG-PDA 06/20/09  . ELBOW FRACTURE SURGERY Left    broken ulna on left elbow-surgical repair  . ELECTROPHYSIOLOGIC STUDY N/A 10/31/2014   Procedure: A-Flutter;  Surgeon: [name], MD;  Location: [place] CV LAB;  Service: Cardiovascular;  Laterality: N/A;  . ELECTROPHYSIOLOGIC STUDY N/A 11/20/2015   Procedure: A-Flutter Ablation;  Surgeon: [name], MD;  Location: [place] CV LAB;  Service: Cardiovascular;  Laterality: N/A;  . FRACTURE SURGERY    . TESTICLE REMOVAL Right 1982  . TONSILLECTOMY  ~ 1967     Current Outpatient Medications   Medication Sig Dispense Refill  . BD VEO INSULIN SYRINGE U/F 31G X 15/64" 0.5 ML MISC USE TO INJECT INSULIN 4 TIMES A DAY 300 each 1  . bimatoprost (LUMIGAN) 0.01 % SOLN Place 1 drop into the left eye at bedtime.    . Coenzyme Q10 (CO Q 10) 100 MG CAPS Take 1 capsule by mouth daily. 90 capsule 1  . ELIQUIS 5 MG TABS tablet TAKE 1 TABLET TWICE DAILY 180 tablet 2  . insulin NPH Human (NOVOLIN N) 100 UNIT/ML injection INJECT 26 UNITS IN THE SKIN DAILY 10 mL 1  . lisinopril (PRINIVIL,ZESTRIL) 5 MG tablet TAKE 1 TABLET BY MOUTH EVERY DAY 90 tablet 2  . metoprolol tartrate (LOPRESSOR) 50 MG tablet TAKE 1/2 TABLET TWICE DAILY 90 tablet 2  . NOVOLOG 100 UNIT/ML injection INJECT 12-15 UNITS IN THE SKIN 3 TIMES DAILY 10 mL 3   No current facility-administered medications for this visit.     Allergies:   Actos [pioglitazone]; Bee venom; Codeine; Statins; Glucophage [metformin hcl]; and Raspberry   Social History:  The patient  reports that he quit smoking about 29 years ago. His smoking use included cigarettes. He smoked 0.00 packs per day for 15.00 years. He has never used smokeless tobacco. He reports that he drinks about 1.8 oz of alcohol per week. He reports that he does not use drugs.   Family History:  The patient's family history includes Diabetes in his father, mother, and sister; Heart attack in his father; Heart disease in his father; Hypertension in his father.    ROS:  Please see the history of present illness.   Otherwise, review of systems is positive for wound problems, back pain.   All other systems are reviewed and negative.   PHYSICAL EXAM: VS:  BP 128/70   Pulse 82   Ht 5\' 6"  (1.676 m)   Wt 205 lb (93 kg)   SpO2 97%   BMI 33.09 kg/m  , BMI Body mass index is 33.09 kg/m. GEN: Well nourished, well developed, in no acute distress  HEENT: normal  Neck: no JVD, carotid bruits, or masses Cardiac: RRR; no murmurs, rubs, or gallops,no edema  Respiratory:  clear to auscultation  bilaterally, normal work of breathing GI: soft, nontender, nondistended, + BS MS: no deformity or atrophy  Skin: warm and dry Neuro:  Strength and sensation are intact Psych: euthymic mood, full affect  EKG:  EKG is ordered today. Personal review of the ekg ordered shows this rhythm, right bundle branch block, inferior infarct  Recent Labs: 02/05/2017: Hemoglobin 12.5; Platelets 248.0 07/06/2017: ALT 23; BUN 19; Creatinine, Ser 1.03; Potassium 4.4; Sodium 137    Lipid Panel     Component Value Date/Time   CHOL 227 (H) 07/06/2017 0910   CHOL 216 (H) 06/21/2013 1405   TRIG 101.0 07/06/2017 0910   TRIG 132 06/21/2013 1405   HDL 54.00 07/06/2017 0910  HDL 54 06/21/2013 1405   CHOLHDL 4 07/06/2017 0910   VLDL 20.2 07/06/2017 0910   LDLCALC 153 (H) 07/06/2017 0910   LDLCALC 136 (H) 06/21/2013 1405     Wt Readings from Last 3 Encounters:  08/17/17 205 lb (93 kg)  07/08/17 211 lb (95.7 kg)  03/23/17 206 lb 9.6 oz (93.7 kg)      Other studies Reviewed: Additional studies/ records that were reviewed today include: TTE  08/21/16 Review of the above records today demonstrates:  - Left ventricle: The cavity size was normal. Wall thickness was   increased in a pattern of mild LVH. Systolic function was normal.   The estimated ejection fraction was in the range of 55% to 60%.   Wall motion was normal; there were no regional wall motion   abnormalities. Features are consistent with a pseudonormal left   ventricular filling pattern, with concomitant abnormal relaxation   and increased filling pressure (grade 2 diastolic dysfunction).   Doppler parameters are consistent with high ventricular filling   pressure. - Mitral valve: Severely calcified annulus. There was mild   regurgitation. - Left atrium: The atrium was mildly dilated.   ASSESSMENT AND PLAN:  1.  Atypical atrial flutter: Ablation 09/17/2016.  Currently on Eliquis.  Has remained in sinus rhythm though he has had some  palpitations at night.  No changes at this time.    This patients CHA2DS2-VASc Score and unadjusted Ischemic Stroke Rate (% per year) is equal to 4.8 % stroke rate/year from a score of 4  Above score calculated as 1 point each if present [CHF, HTN, DM, Vascular=MI/PAD/Aortic Plaque, Age if 65-74, or Male] Above score calculated as 2 points each if present [Age > 75, or Stroke/TIA/TE]    2. Coronary artery disease: No current chest pain.  Continue current management.  Current medicines are reviewed at length with the patient today.   The patient does not have concerns regarding his medicines.  The following changes were made today: None  Labs/ tests ordered today include:  Orders Placed This Encounter  Procedures  . EKG 12-Lead     Disposition:   FU with Will Camnitz 12 months  Signed, Will Meredith Leeds, MD  08/17/2017 12:23 PM     [facility] [address] [place] [address] [phone] (office) [phone] (fax)

## 2017-08-17 NOTE — Patient Instructions (Signed)
Medication Instructions:  Your physician recommends that you continue on your current medications as directed. Please refer to the Current Medication list given to you today.  Labwork: None ordered  Testing/Procedures: None ordered  Follow-Up: Your physician wants you to follow-up in: 1 year with Dr. Camnitz.  You will receive a reminder letter in the mail two months in advance. If you don't receive a letter, please call our office to schedule the follow-up appointment.  * If you need a refill on your cardiac medications before your next appointment, please call your pharmacy.   *Please note that any paperwork needing to be filled out by the provider will need to be addressed at the front desk prior to seeing the provider. Please note that any FMLA, disability or other documents regarding health condition is subject to a $25.00 charge that must be received prior to completion of paperwork in the form of a money order or check.  Thank you for choosing [facility]!!   Sherri Price, RN [phone]        

## 2017-08-24 ENCOUNTER — Encounter: Payer: Self-pay | Admitting: Endocrinology

## 2017-08-24 ENCOUNTER — Ambulatory Visit: Payer: Medicare HMO | Admitting: Endocrinology

## 2017-08-24 VITALS — BP 158/80 | HR 83 | Ht 66.0 in | Wt 204.0 lb

## 2017-08-24 DIAGNOSIS — Z794 Long term (current) use of insulin: Secondary | ICD-10-CM

## 2017-08-24 DIAGNOSIS — E7800 Pure hypercholesterolemia, unspecified: Secondary | ICD-10-CM | POA: Diagnosis not present

## 2017-08-24 DIAGNOSIS — E1165 Type 2 diabetes mellitus with hyperglycemia: Secondary | ICD-10-CM | POA: Diagnosis not present

## 2017-08-24 NOTE — Patient Instructions (Addendum)
Check blood sugars on waking up    Also check blood sugars about 2 hours after a meal and do this after different meals by rotation  Recommended blood sugar levels on waking up is 90-130 and about 2 hours after meal is 130-160  Please bring your blood sugar monitor to each visit, thank you  Reduce Humalog 50% when planning activity  Take N insulin at bedtime  Low fat meals

## 2017-08-24 NOTE — Progress Notes (Signed)
Patient ID: Matthew [name], male   DOB: November 09, 1945, 72 y.o.   MRN: [id number]           Reason for Appointment: Followup for Type 2 Diabetes  Referring physician: Criss Rosales  History of Present Illness:          Diagnosis: Type 2 diabetes mellitus, date of diagnosis:  1992      Past history: His blood sugar was high at diagnosis when he was having a routine screening done. He was started on Glucophage initially which he took for about a year. He thinks it did not help his sugar much and he did not feel good with it Apparently he was trying to control his diabetes with diet and exercise for a few years. He may have tried Glucophage again before going on insulin. Also was started on Actos which caused swelling He has been treated mostly with premixed insulin for about 15 years and his level of control appears to be inadequate although details of previous treatment are not available. He may have taken Humalog at one time for use with high sugars A1c was 11.5 in 2011 On his initial consultation in 9/15 because of poor control with premixed insulin he was switched to Levemir twice a day and Humalog with meals.  Recent history:   INSULIN regimen is described as:20-22 N at 2 am; Humalog 15-16 units with meals   His A1c is consistently high and now 9.2 compared to 8.6  He has not followed the recommendations made on his last visit as follows: Start monitoring blood sugars regularly and bring monitor for download Look into the insurance coverage for the freestyle [place] He will start adding 4-6 units of NPH in the morning mixed with his Humalog and increase this if blood sugars are still high at midday More blood sugars after supper Improve diet with low fat intake   Current management, blood sugar patterns and problems:  He has mostly checked his blood sugars when he feels hypoglycemic in the afternoon and occasionally early morning  However he has several blood sugars in the morning hours  that are significantly high also  He now says that he is trying to take his NPH at about 2 AM when he is waking up but he may miss a dose at least once a week  He is still eating 2-3 meals a day and taking variable doses of insulin based on his estimate of Ms. [place] Shire  However he is not taking into account whether he will be active after his insulin dose late morning and has readings as low as 34 in the early afternoon  He also has had one low blood sugar at bedtime of 45 and he does not remember why  He does not check his POSTPRANDIAL readings after supper despite reminders  Not adjusting his mealtime doses based on carbohydrate counting or estimated fat intake and not interested in learning carbohydrate counting   he again refuses to consider an insulin pump  With hypoglycemia he has symptoms of weakness, sweating, shakiness and confusion Treating low sugars with orange juice       Oral hypoglycemic drugs the patient is taking are: None      Side effects from medications have been: Edema from Actos,? Nausea and malaise from metformin Compliance with the medical regimen: Fair  Glucose monitoring:  done   one time a day         Glucometer: One Touch.      Blood Glucose  readings as follows  FASTING 159-381 2-5 PM blood sugar range 34-62  Self-care:   Meals: 2-3 meals per day.  Bfst 8:30-9, 1 pm Dinner is about 8 PM.  Some meals are high fat       Exercise: yardwork         Dietician visit, most recent:?  15 years ago.      CDE visit: 3/16            Weight history:  Wt Readings from Last 3 Encounters:  08/24/17 204 lb (92.5 kg)  08/17/17 205 lb (93 kg)  07/08/17 211 lb (95.7 kg)    Glycemic control:   Lab Results  Component Value Date   HGBA1C 9.2 (H) 07/06/2017   HGBA1C 8.6 (H) 02/05/2017   HGBA1C 9.0 (H) 03/26/2016   Lab Results  Component Value Date   MICROALBUR 5.3 (H) 02/10/2017   LDLCALC 153 (H) 07/06/2017   CREATININE 1.03 07/06/2017    No visits with  results within 1 Week(s) from this visit.  Latest known visit with results is:  Lab on 07/06/2017  Component Date Value Ref Range Status  . Cholesterol 07/06/2017 227* 0 - 200 mg/dL Final   ATP III Classification       Desirable:  < 200 mg/dL               Borderline High:  200 - 239 mg/dL          High:  > = 240 mg/dL  . Triglycerides 07/06/2017 101.0  0.0 - 149.0 mg/dL Final   Normal:  <150 mg/dLBorderline High:  150 - 199 mg/dL  . HDL 07/06/2017 54.00  >39.00 mg/dL Final  . VLDL 07/06/2017 20.2  0.0 - 40.0 mg/dL Final  . LDL Cholesterol 07/06/2017 153* 0 - 99 mg/dL Final  . Total CHOL/HDL Ratio 07/06/2017 4   Final                  Men          Women1/2 Average Risk     3.4          3.3Average Risk          5.0          4.42X Average Risk          9.6          7.13X Average Risk          15.0          11.0                      . NonHDL 07/06/2017 173.18   Final   NOTE:  Non-HDL goal should be 30 mg/dL higher than patient's LDL goal (i.e. LDL goal of < 70 mg/dL, would have non-HDL goal of < 100 mg/dL)  . Sodium 07/06/2017 137  135 - 145 mEq/L Final  . Potassium 07/06/2017 4.4  3.5 - 5.1 mEq/L Final  . Chloride 07/06/2017 100  96 - 112 mEq/L Final  . CO2 07/06/2017 31  19 - 32 mEq/L Final  . Glucose, Bld 07/06/2017 158* 70 - 99 mg/dL Final  . BUN 07/06/2017 19  6 - 23 mg/dL Final  . Creatinine, Ser 07/06/2017 1.03  0.40 - 1.50 mg/dL Final  . Total Bilirubin 07/06/2017 0.5  0.2 - 1.2 mg/dL Final  . Alkaline Phosphatase 07/06/2017 68  39 - 117 U/L Final  . AST 07/06/2017 28  0 - 37  U/L Final  . ALT 07/06/2017 23  0 - 53 U/L Final  . Total Protein 07/06/2017 6.6  6.0 - 8.3 g/dL Final  . Albumin 07/06/2017 3.7  3.5 - 5.2 g/dL Final  . Calcium 07/06/2017 9.5  8.4 - 10.5 mg/dL Final  . GFR 07/06/2017 76.06  >60.00 mL/min Final  . Hgb A1c MFr Bld 07/06/2017 9.2* 4.6 - 6.5 % Final   Glycemic Control Guidelines for People with Diabetes:Non Diabetic:  <6%Goal of Therapy: <7%Additional Action  Suggested:  >8%       Allergies as of 08/24/2017      Reactions   Actos [pioglitazone] Swelling   Bee Venom Swelling   Codeine Hives, Nausea Only   Statins Other (See Comments)   Muscle pain, fatigue   Glucophage [metformin Hcl] Nausea Only   Raspberry Itching      Medication List        Accurate as of 08/24/17  3:56 PM. Always use your most recent med list.          BD VEO INSULIN SYRINGE U/F 31G X 15/64" 0.5 ML Misc Generic drug:  Insulin Syringe-Needle U-100 USE TO INJECT INSULIN 4 TIMES A DAY   bimatoprost 0.01 % Soln Commonly known as:  LUMIGAN Place 1 drop into the left eye at bedtime.   Co Q 10 100 MG Caps Take 1 capsule by mouth daily.   ELIQUIS 5 MG Tabs tablet Generic drug:  apixaban TAKE 1 TABLET TWICE DAILY   insulin NPH Human 100 UNIT/ML injection Commonly known as:  NOVOLIN N INJECT 26 UNITS IN THE SKIN DAILY   lisinopril 5 MG tablet Commonly known as:  PRINIVIL,ZESTRIL TAKE 1 TABLET BY MOUTH EVERY DAY   metoprolol tartrate 50 MG tablet Commonly known as:  LOPRESSOR TAKE 1/2 TABLET TWICE DAILY   NOVOLOG 100 UNIT/ML injection Generic drug:  insulin aspart INJECT 12-15 UNITS IN THE SKIN 3 TIMES DAILY       Allergies:  Allergies  Allergen Reactions  . Actos [Pioglitazone] Swelling  . Bee Venom Swelling  . Codeine Hives and Nausea Only  . Statins Other (See Comments)    Muscle pain, fatigue  . Glucophage [Metformin Hcl] Nausea Only  . Raspberry Itching    Past Medical History:  Diagnosis Date  . Anemia   . Arthritis    "back, left ankle" (11/20/2015)  . CHF (congestive heart failure) ([place])   . Chronic lower back pain   . Coronary artery disease    a. 05/2009 CABG x 3: LIMA->LAD, VG->OM, VG->PDA; b. Nuc 01/2014: inf-lat scar but no ischemia, EF 57%.  . Diabetic retinopathy (HCC)    mild- Dr. Herbert Deaner  . Glaucoma   . Hyperlipidemia    a. Intolerant of lipitor and vytorin.  . Migraine    "once or twice" (11/20/2015)  . Myocardial  infarction [facility])    'saw evidence of it on an EKG done in 2005"  . Paroxysmal atrial flutter ([place])    a. 01/2014 s/p DCCV;  b. CHA2DS2VASc = 3-->Eliquis.  . Pneumonia 1960  . PTSD (post-traumatic stress disorder)    "X 2 yr post OHS" (10/31/2014)  . Sacral pain    "right"  . Sciatica of right side   . Testicular cancer ([place]) 1982   "heavy doses of radiation; it was stage 2"  . Type II diabetes mellitus ([place])   . Valvular heart disease    a. 01/2014 Echo: Ef 60-65%, no rwma, mild AI/MS, mod MR, mildly dil  LA.    Past Surgical History:  Procedure Laterality Date  . ABDOMINAL EXPLORATION SURGERY  ~ 2005   "for hernia, but didn't have one"  . ATRIAL FIBRILLATION ABLATION N/A 09/17/2016   Procedure: Atrial Fibrillation Ablation;  Surgeon: [name], MD;  Location: [place] CV LAB;  Service: Cardiovascular;  Laterality: N/A;  . CARDIAC CATHETERIZATION  2011  . CARDIOVERSION N/A 02/19/2014   Procedure: CARDIOVERSION;  Surgeon: [name], MD;  Location[facility] ENDOSCOPY;  Service: Cardiovascular;  Laterality: N/A;  . CARDIOVERSION N/A 10/28/2015   Procedure: CARDIOVERSION;  Surgeon: [name], MD;  Location[facility] ENDOSCOPY;  Service: Cardiovascular;  Laterality: N/A;  . CARDIOVERSION  11/20/2015   "200 joules"  . CORONARY ARTERY BYPASS GRAFT  05/2009   LIMA-LAD, SVG-OM, SVG-PDA 06/20/09  . ELBOW FRACTURE SURGERY Left    broken ulna on left elbow-surgical repair  . ELECTROPHYSIOLOGIC STUDY N/A 10/31/2014   Procedure: A-Flutter;  Surgeon: [name], MD;  Location: [place] CV LAB;  Service: Cardiovascular;  Laterality: N/A;  . ELECTROPHYSIOLOGIC STUDY N/A 11/20/2015   Procedure: A-Flutter Ablation;  Surgeon: [name], MD;  Location: [place] CV LAB;  Service: Cardiovascular;  Laterality: N/A;  . FRACTURE SURGERY    . TESTICLE REMOVAL Right 1982  . TONSILLECTOMY  ~ 30    Family History  Problem Relation Age of Onset  . Diabetes Mother   . Heart  disease Father   . Heart attack Father   . Diabetes Father   . Hypertension Father   . Diabetes Sister     Social History:  reports that he quit smoking about 29 years ago. His smoking use included cigarettes. He smoked 0.00 packs per day for 15.00 years. He has never used smokeless tobacco. He reports that he drinks about 1.8 oz of alcohol per week. He reports that he does not use drugs.    Review of Systems         Lipids: He was previously on Vytorin and Lipitor. He thinks he had myalgias and weakness when taking these drugs.  He has been recommended statin drugs in the lipid clinic but he refuses to take them stating that they will not help him.   Also has significantly increased LDL particle number  He is persistently reluctant to take  Any medications at all includig Zetia     Lab Results  Component Value Date   CHOL 227 (H) 07/06/2017   HDL 54.00 07/06/2017   LDLCALC 153 (H) 07/06/2017   TRIG 101.0 07/06/2017   CHOLHDL 4 07/06/2017    He has been  treated by cardiologist for his atrial fibrillation    Physical Examination:  BP (!) 158/80 (BP Location: Left Arm, Patient Position: Sitting, Cuff Size: Normal)   Pulse 83   Ht 5\' 6"  (1.676 m)   Wt 204 lb (92.5 kg)   SpO2 97%   BMI 32.93 kg/m     ASSESSMENT:  Diabetes type 2, uncontrolled   See history of present illness for detailed discussion of current diabetes management, blood sugar patterns and problems identified  His A1c is to be higher 9.2% last month He is checking his blood sugars only sporadically and mostly when he feels hypoglycemic Explained to the patient that his A1c indicates his average blood sugar to be about 220 but no consistent pattern of high readings seen since he does not check his blood sugars especially after meals Also may be irregular with his bedtime  NPH insulin which is his only basal insulin currently He needs to adjust his mealtime dose based on total carbohydrate content,  more for high fat meals and much less for planned activity after the meals  HYPERLIPIDEMIA: His lipids are not controlled and does not want to take medications  Increased blood pressure: He will follow-up with his cardiologist   PLAN:    Check blood sugars at least once a day at different times including about 2 to 3 hours after meals including after lunch and supper  Start taking NPH insulin at 10:30 PM when he goes to bed instead of during the night  Do not adjust the NPH at night based on his meal size  If his fasting readings are consistently high in the morning he will need to increase his NPH  Taken to the coverage for the [employer] with his insurance and given him a list of DME suppliers to call also if covered  Cut down the insulin at least 50% or more if he is going to be active after his first meal  Consultation of the dietitian  For this visit he will need to keep a record of at least 3 days of complete records of what he is eating, pre-and post meal blood sugars and insulin doses  Cut back on high fat foods    Patient Instructions  Check blood sugars on waking up    Also check blood sugars about 2 hours after a meal and do this after different meals by rotation  Recommended blood sugar levels on waking up is 90-130 and about 2 hours after meal is 130-160  Please bring your blood sugar monitor to each visit, thank you  Reduce Humalog 50% when planning activity  Take N insulin at bedtime  Low fat meals    Counseling time on subjects discussed in assessment and plan sections is over 50% of today's 25 minute visit       Elayne Snare 08/24/2017, 3:56 PM   Note: This office note was prepared with Dragon voice recognition system technology. Any transcriptional errors that result from this process are unintentional.

## 2017-09-05 ENCOUNTER — Other Ambulatory Visit: Payer: Self-pay | Admitting: Internal Medicine

## 2017-09-07 ENCOUNTER — Other Ambulatory Visit: Payer: Self-pay | Admitting: Endocrinology

## 2017-09-30 ENCOUNTER — Other Ambulatory Visit: Payer: Self-pay | Admitting: Endocrinology

## 2017-10-03 ENCOUNTER — Other Ambulatory Visit: Payer: Self-pay | Admitting: Endocrinology

## 2017-10-17 ENCOUNTER — Other Ambulatory Visit: Payer: Self-pay | Admitting: Endocrinology

## 2017-10-21 ENCOUNTER — Other Ambulatory Visit (INDEPENDENT_AMBULATORY_CARE_PROVIDER_SITE_OTHER): Payer: Medicare HMO

## 2017-10-21 DIAGNOSIS — E1165 Type 2 diabetes mellitus with hyperglycemia: Secondary | ICD-10-CM

## 2017-10-21 DIAGNOSIS — Z794 Long term (current) use of insulin: Secondary | ICD-10-CM | POA: Diagnosis not present

## 2017-10-21 LAB — BASIC METABOLIC PANEL
BUN: 18 mg/dL (ref 6–23)
CO2: 30 mEq/L (ref 19–32)
Calcium: 9.3 mg/dL (ref 8.4–10.5)
Chloride: 103 mEq/L (ref 96–112)
Creatinine, Ser: 1.27 mg/dL (ref 0.40–1.50)
GFR: 59.68 mL/min — ABNORMAL LOW (ref >60.00)
Glucose, Bld: 110 mg/dL — ABNORMAL HIGH (ref 70–99)
Potassium: 4.5 mEq/L (ref 3.5–5.1)
Sodium: 137 mEq/L (ref 135–145)

## 2017-10-21 LAB — HEMOGLOBIN A1C: Hgb A1c MFr Bld: 9.5 % — ABNORMAL HIGH (ref 4.6–6.5)

## 2017-11-01 NOTE — Progress Notes (Signed)
Patient ID: Matthew [name], male   DOB: 09/11/1945, 72 y.o.   MRN: [id number]           Reason for Appointment: Followup for Type 2 Diabetes  Referring physician: Criss Rosales  History of Present Illness:          Diagnosis: Type 2 diabetes mellitus, date of diagnosis:  1992      Past history: His blood sugar was high at diagnosis when he was having a routine screening done. He was started on Glucophage initially which he took for about a year. He thinks it did not help his sugar much and he did not feel good with it Apparently he was trying to control his diabetes with diet and exercise for a few years. He may have tried Glucophage again before going on insulin. Also was started on Actos which caused swelling He has been treated mostly with premixed insulin for about 15 years and his level of control appears to be inadequate although details of previous treatment are not available. He may have taken Humalog at one time for use with high sugars A1c was 11.5 in 2011 On his initial consultation in 9/15 because of poor control with premixed insulin he was switched to Levemir twice a day and Humalog with meals.  Recent history:   INSULIN regimen is described as:20-22 N at 1 am; Humalog 15-16 units with meals   His A1c is consistently high and now 9.5 and gradually increasing  He has not followed the recommendations made on his last visit for blood sugar monitoring or getting the freestyle libre sensor   Current management, blood sugar patterns and problems:  He has checked his blood sugar 10 times in the last month but only 3 of them are not hypoglycemic; he only checks his blood sugars when he starts feeling low  He also thinks that he has HYPOGLYCEMIA sometimes overnight and in the late afternoon  He is apparently not adjusting his insulin based on his carbohydrate intake and last evening even though he had only half a baked potato and a little watermelon with his evening meal he took 15  units and blood sugar was low during the night  This has occurred another time last month  He appears to have had low sugars twice around 7 AM and also 3 times in the evening between 3-7 PM  On his last visit he was having high blood sugars fasting also  HIGHEST blood sugar recorded is 210 at 4 PM, probably after lunch  Usually has variable intake and sometimes high fat meals  His weight is slightly better compared to 3 pounds heavier on the last visit  Again because of back pain he is not able to do much exercise  He refuses to consider an insulin pump or any device on his body and will not accept doing the freestyle libre sensor despite discussing the advantages of this  Probably adjusting his insulin arbitrarily with the NPH and last night since blood sugar was 52 he did not take any NPH  With hypoglycemia he has symptoms of weakness, sweating, shakiness and confusion Treating low sugars with orange juice       Oral hypoglycemic drugs the patient is taking are: None      Side effects from medications have been: Edema from Actos,? Nausea and malaise from metformin Compliance with the medical regimen: Fair  Glucose monitoring:  done   one time a day  Glucometer: One Touch.      Blood Glucose readings as above   Self-care:   Meals: 2-3 meals per day.  Bfst 8:30-9, 1 pm Dinner is about 8 PM.  Some meals are high fat       Exercise: Some yardwork         Dietician visit, most recent:?  15 years ago.      CDE visit: 3/16            Weight history:  Wt Readings from Last 3 Encounters:  11/02/17 201 lb 12.8 oz (91.5 kg)  08/24/17 204 lb (92.5 kg)  08/17/17 205 lb (93 kg)    Glycemic control:   Lab Results  Component Value Date   HGBA1C 9.5 (H) 10/21/2017   HGBA1C 9.2 (H) 07/06/2017   HGBA1C 8.6 (H) 02/05/2017   Lab Results  Component Value Date   MICROALBUR 5.3 (H) 02/10/2017   LDLCALC 153 (H) 07/06/2017   CREATININE 1.27 10/21/2017    No visits with  results within 1 Week(s) from this visit.  Latest known visit with results is:  Lab on 10/21/2017  Component Date Value Ref Range Status  . Sodium 10/21/2017 137  135 - 145 mEq/L Final  . Potassium 10/21/2017 4.5  3.5 - 5.1 mEq/L Final  . Chloride 10/21/2017 103  96 - 112 mEq/L Final  . CO2 10/21/2017 30  19 - 32 mEq/L Final  . Glucose, Bld 10/21/2017 110* 70 - 99 mg/dL Final  . BUN 10/21/2017 18  6 - 23 mg/dL Final  . Creatinine, Ser 10/21/2017 1.27  0.40 - 1.50 mg/dL Final  . Calcium 10/21/2017 9.3  8.4 - 10.5 mg/dL Final  . GFR 10/21/2017 59.68* >60.00 mL/min Final  . Hgb A1c MFr Bld 10/21/2017 9.5* 4.6 - 6.5 % Final   Glycemic Control Guidelines for People with Diabetes:Non Diabetic:  <6%Goal of Therapy: <7%Additional Action Suggested:  >8%       Allergies as of 11/02/2017      Reactions   Actos [pioglitazone] Swelling   Bee Venom Swelling   Codeine Hives, Nausea Only   Statins Other (See Comments)   Muscle pain, fatigue   Glucophage [metformin Hcl] Nausea Only   Raspberry Itching      Medication List        Accurate as of 11/02/17  1:37 PM. Always use your most recent med list.          BD VEO INSULIN SYRINGE U/F 31G X 15/64" 0.5 ML Misc Generic drug:  Insulin Syringe-Needle U-100 USE TO INJECT INSULIN 4 TIMES A DAY   bimatoprost 0.01 % Soln Commonly known as:  LUMIGAN Place 1 drop into the left eye at bedtime.   Co Q 10 100 MG Caps Take 1 capsule by mouth daily.   dapagliflozin propanediol 5 MG Tabs tablet Commonly known as:  FARXIGA Take 5 mg by mouth daily.   ELIQUIS 5 MG Tabs tablet Generic drug:  apixaban TAKE 1 TABLET BY MOUTH TWICE A DAY   insulin NPH Human 100 UNIT/ML injection Commonly known as:  NOVOLIN N INJECT 26 UNITS IN THE SKIN DAILY   lisinopril 5 MG tablet Commonly known as:  PRINIVIL,ZESTRIL TAKE 1 TABLET BY MOUTH EVERY DAY   metoprolol tartrate 50 MG tablet Commonly known as:  LOPRESSOR TAKE 1/2 TABLET TWICE DAILY   NOVOLOG 100  UNIT/ML injection Generic drug:  insulin aspart INJECT 12-15 UNITS IN THE SKIN 3 TIMES DAILY  Allergies:  Allergies  Allergen Reactions  . Actos [Pioglitazone] Swelling  . Bee Venom Swelling  . Codeine Hives and Nausea Only  . Statins Other (See Comments)    Muscle pain, fatigue  . Glucophage [Metformin Hcl] Nausea Only  . Raspberry Itching    Past Medical History:  Diagnosis Date  . Anemia   . Arthritis    "back, left ankle" (11/20/2015)  . CHF (congestive heart failure) ([place])   . Chronic lower back pain   . Coronary artery disease    a. 05/2009 CABG x 3: LIMA->LAD, VG->OM, VG->PDA; b. Nuc 01/2014: inf-lat scar but no ischemia, EF 57%.  . Diabetic retinopathy ([facility])    mild- Dr. Herbert Deaner  . Glaucoma   . Hyperlipidemia    a. Intolerant of lipitor and vytorin.  . Migraine    "once or twice" (11/20/2015)  . Myocardial infarction [facility])    'saw evidence of it on an EKG done in 2005"  . Paroxysmal atrial flutter ([place])    a. 01/2014 s/p DCCV;  b. CHA2DS2VASc = 3-->Eliquis.  . Pneumonia 1960  . PTSD (post-traumatic stress disorder)    "X 2 yr post OHS" (10/31/2014)  . Sacral pain    "right"  . Sciatica of right side   . Testicular cancer ([place]) 1982   "heavy doses of radiation; it was stage 2"  . Type II diabetes mellitus ([place])   . Valvular heart disease    a. 01/2014 Echo: Ef 60-65%, no rwma, mild AI/MS, mod MR, mildly dil LA.    Past Surgical History:  Procedure Laterality Date  . ABDOMINAL EXPLORATION SURGERY  ~ 2005   "for hernia, but didn't have one"  . ATRIAL FIBRILLATION ABLATION N/A 09/17/2016   Procedure: Atrial Fibrillation Ablation;  Surgeon: [name], MD;  Location: [facility];  Service: Cardiovascular;  Laterality: N/A;  . CARDIAC CATHETERIZATION  2011  . CARDIOVERSION N/A 02/19/2014   Procedure: CARDIOVERSION;  Surgeon: [name], MD;  Location: [facility] ENDOSCOPY;  Service: Cardiovascular;  Laterality: N/A;  . CARDIOVERSION N/A  10/28/2015   Procedure: CARDIOVERSION;  Surgeon: [name], MD;  Location: [facility] ENDOSCOPY;  Service: Cardiovascular;  Laterality: N/A;  . CARDIOVERSION  11/20/2015   "200 joules"  . CORONARY ARTERY BYPASS GRAFT  05/2009   LIMA-LAD, SVG-OM, SVG-PDA 06/20/09  . ELBOW FRACTURE SURGERY Left    broken ulna on left elbow-surgical repair  . ELECTROPHYSIOLOGIC STUDY N/A 10/31/2014   Procedure: A-Flutter;  Surgeon: [name], MD;  Location: [facility];  Service: Cardiovascular;  Laterality: N/A;  . ELECTROPHYSIOLOGIC STUDY N/A 11/20/2015   Procedure: A-Flutter Ablation;  Surgeon: [name], MD;  Location: [facility];  Service: Cardiovascular;  Laterality: N/A;  . FRACTURE SURGERY    . TESTICLE REMOVAL Right 1982  . TONSILLECTOMY  ~ 59    Family History  Problem Relation Age of Onset  . Diabetes Mother   . Heart disease Father   . Heart attack Father   . Diabetes Father   . Hypertension Father   . Diabetes Sister     Social History:  reports that he quit smoking about 29 years ago. His smoking use included cigarettes. He smoked 0.00 packs per day for 15.00 years. He has never used smokeless tobacco. He reports that he drinks about 1.8 oz of alcohol per week. He reports that he does not use drugs.    Review of Systems  Lipids: He was previously on Vytorin and Lipitor. He thinks he had myalgias and weakness when taking these drugs.  He has been recommended statin drugs in the lipid clinic but he refuses to take them stating that they will not help him.   Also has significantly increased LDL particle number  He would not take non-statin medications at all includig Zetia     Lab Results  Component Value Date   CHOL 227 (H) 07/06/2017   HDL 54.00 07/06/2017   LDLCALC 153 (H) 07/06/2017   TRIG 101.0 07/06/2017   CHOLHDL 4 07/06/2017    He has been  treated by cardiologist for his atrial fibrillation    Physical Examination:  BP (!) 144/78 (BP  Location: Left Arm, Patient Position: Sitting, Cuff Size: Normal)   Pulse 92   Ht 5\' 6"  (1.676 m)   Wt 201 lb 12.8 oz (91.5 kg)   SpO2 96%   BMI 32.57 kg/m     ASSESSMENT:  Diabetes type 2, uncontrolled   See history of present illness for detailed discussion of current diabetes management, blood sugar patterns and problems identified  His A1c is relatively higher at 9.5  With limited blood sugar monitoring again difficult to know what his blood sugar patterns are Blood sugars are likely to be inconsistent especially with taking only NPH once a day This also may be not taken regularly Today discussed the importance of adjusting his mealtime dose based on his carbohydrate intake Discussed that he tends to have late hypoglycemia when he is eating low-fat low carbohydrate meals especially at suppertime and then taking the usual dose of 15 units Humalog  Most of the time he does not follow instructions for glucose monitoring, insulin adjustment or considering newer devices   Increased blood pressure: Not clear if he has some whitecoat syndrome, blood pressure relatively high again   PLAN:    Check blood sugars at least 2 times a day at different times including about 2 to 3 hours after meals for at least 2 weeks prior to his next visit  Consistently low-fat diet  He is a good candidate for an SGLT2 drug like Invokana to help him with his blood sugar control, promote some weight loss, more optimal blood pressure and also reduce cardiovascular and renal risk in the long run Discussed action of SGLT 2 drugs on lowering glucose by decreasing kidney absorption of glucose, benefits of weight loss and lower blood pressure, possible side effects including candidiasis and dosage regimen   Since Farxiga is the only drug on his formulary he will start 5 mg daily  With this he will probably need to reduce all his insulin doses by 2 to 4 units especially with starting to check blood sugars  more consistently after this change  With low carbohydrate meals he should not take more than 10 to 12 units of Humalog  Since he has borderline renal function he will stop his lisinopril and not combined with [place]  Also he likely will not need to take any as needed Lasix which he does occasionally while starting [place]  To recheck renal function and follow-up in about 2 months    Patient Instructions  Humalog 10-15 units at supper  But must check sugar at bedtime to see how it works  Check blood sugars on waking up  daily  Also check blood sugars about 2 hours after a meal and do this after different meals by rotation  Recommended blood sugar levels on waking  up is 90-130 and about 2 hours after meal is 130-160  Please bring your blood sugar monitor to each visit, thank you  N insulin 18 units  STOP LISINOPRIL     Counseling time on subjects discussed in assessment and plan sections is over 50% of today's 25 minute visit       Elayne Snare 11/02/2017, 1:37 PM   Note: This office note was prepared with Dragon voice recognition system technology. Any transcriptional errors that result from this process are unintentional.

## 2017-11-02 ENCOUNTER — Encounter: Payer: Self-pay | Admitting: Endocrinology

## 2017-11-02 ENCOUNTER — Ambulatory Visit: Payer: Medicare HMO | Admitting: Endocrinology

## 2017-11-02 VITALS — BP 144/78 | HR 92 | Ht 66.0 in | Wt 201.8 lb

## 2017-11-02 DIAGNOSIS — Z794 Long term (current) use of insulin: Secondary | ICD-10-CM

## 2017-11-02 DIAGNOSIS — I10 Essential (primary) hypertension: Secondary | ICD-10-CM | POA: Diagnosis not present

## 2017-11-02 DIAGNOSIS — E1165 Type 2 diabetes mellitus with hyperglycemia: Secondary | ICD-10-CM | POA: Diagnosis not present

## 2017-11-02 MED ORDER — DAPAGLIFLOZIN PROPANEDIOL 5 MG PO TABS: 5.0000 mg | ORAL_TABLET | Freq: Every day | ORAL | 1 refills | Status: DC

## 2017-11-02 NOTE — Patient Instructions (Signed)
Humalog 10-15 units at supper  But must check sugar at bedtime to see how it works  Check blood sugars on waking up  daily  Also check blood sugars about 2 hours after a meal and do this after different meals by rotation  Recommended blood sugar levels on waking up is 90-130 and about 2 hours after meal is 130-160  Please bring your blood sugar monitor to each visit, thank you  N insulin 18 units  STOP LISINOPRIL

## 2017-11-12 ENCOUNTER — Other Ambulatory Visit: Payer: Self-pay | Admitting: Endocrinology

## 2017-12-13 ENCOUNTER — Other Ambulatory Visit: Payer: Self-pay | Admitting: Endocrinology

## 2017-12-23 ENCOUNTER — Other Ambulatory Visit: Payer: Medicare HMO

## 2017-12-25 ENCOUNTER — Other Ambulatory Visit: Payer: Self-pay | Admitting: Endocrinology

## 2017-12-28 ENCOUNTER — Other Ambulatory Visit: Payer: Medicare HMO

## 2017-12-29 ENCOUNTER — Ambulatory Visit: Payer: Medicare HMO | Admitting: Endocrinology

## 2018-02-03 ENCOUNTER — Telehealth: Payer: Self-pay | Admitting: Endocrinology

## 2018-02-03 ENCOUNTER — Other Ambulatory Visit: Payer: Self-pay

## 2018-02-03 NOTE — Telephone Encounter (Signed)
Patient states they needs one touch vero test strips refilled. [id number] [address], [address]  Ph # [phone]

## 2018-02-04 ENCOUNTER — Other Ambulatory Visit: Payer: Self-pay | Admitting: Endocrinology

## 2018-02-04 DIAGNOSIS — E1165 Type 2 diabetes mellitus with hyperglycemia: Secondary | ICD-10-CM

## 2018-02-04 DIAGNOSIS — R69 Illness, unspecified: Secondary | ICD-10-CM | POA: Diagnosis not present

## 2018-02-04 DIAGNOSIS — Z794 Long term (current) use of insulin: Secondary | ICD-10-CM

## 2018-02-04 MED ORDER — GLUCOSE BLOOD VI STRP: 1.0000 | ORAL_STRIP | 3 refills | Status: DC | PRN

## 2018-02-04 NOTE — Telephone Encounter (Signed)
Rx one touch vero test strips sent to the pharmacy

## 2018-02-10 DIAGNOSIS — R69 Illness, unspecified: Secondary | ICD-10-CM | POA: Diagnosis not present

## 2018-02-27 ENCOUNTER — Other Ambulatory Visit: Payer: Self-pay | Admitting: Internal Medicine

## 2018-02-27 DIAGNOSIS — I483 Typical atrial flutter: Secondary | ICD-10-CM

## 2018-03-01 ENCOUNTER — Other Ambulatory Visit: Payer: Self-pay | Admitting: Internal Medicine

## 2018-03-04 DIAGNOSIS — R69 Illness, unspecified: Secondary | ICD-10-CM | POA: Diagnosis not present

## 2018-03-07 ENCOUNTER — Other Ambulatory Visit (INDEPENDENT_AMBULATORY_CARE_PROVIDER_SITE_OTHER): Payer: Medicare HMO

## 2018-03-07 DIAGNOSIS — Z794 Long term (current) use of insulin: Secondary | ICD-10-CM

## 2018-03-07 DIAGNOSIS — E1165 Type 2 diabetes mellitus with hyperglycemia: Secondary | ICD-10-CM

## 2018-03-07 LAB — MICROALBUMIN / CREATININE URINE RATIO
Creatinine,U: 88.7 mg/dL
Microalb Creat Ratio: 6.4 mg/g (ref 0.0–30.0)
Microalb, Ur: 5.7 mg/dL — ABNORMAL HIGH (ref 0.0–1.9)

## 2018-03-07 LAB — BASIC METABOLIC PANEL
BUN: 26 mg/dL — ABNORMAL HIGH (ref 6–23)
CO2: 28 mEq/L (ref 19–32)
Calcium: 9.2 mg/dL (ref 8.4–10.5)
Chloride: 100 mEq/L (ref 96–112)
Creatinine, Ser: 1.09 mg/dL (ref 0.40–1.50)
GFR: 71.11 mL/min (ref >60.00)
Glucose, Bld: 279 mg/dL — ABNORMAL HIGH (ref 70–99)
Potassium: 4.7 mEq/L (ref 3.5–5.1)
Sodium: 136 mEq/L (ref 135–145)

## 2018-03-08 LAB — FRUCTOSAMINE: Fructosamine: 321 umol/L — ABNORMAL HIGH (ref 0–285)

## 2018-03-10 ENCOUNTER — Ambulatory Visit (INDEPENDENT_AMBULATORY_CARE_PROVIDER_SITE_OTHER): Payer: Medicare HMO | Admitting: Endocrinology

## 2018-03-10 ENCOUNTER — Encounter: Payer: Self-pay | Admitting: Endocrinology

## 2018-03-10 VITALS — BP 126/80 | HR 96 | Wt 206.0 lb

## 2018-03-10 DIAGNOSIS — E1165 Type 2 diabetes mellitus with hyperglycemia: Secondary | ICD-10-CM | POA: Diagnosis not present

## 2018-03-10 DIAGNOSIS — Z794 Long term (current) use of insulin: Secondary | ICD-10-CM

## 2018-03-10 LAB — POCT GLYCOSYLATED HEMOGLOBIN (HGB A1C): Hemoglobin A1C: 8.1 % — AB (ref 4.0–5.6)

## 2018-03-10 LAB — GLUCOSE, POCT (MANUAL RESULT ENTRY): POC Glucose: 207 mg/dl — AB (ref 70–99)

## 2018-03-10 NOTE — Patient Instructions (Addendum)
Check blood sugars on waking up 5 days a week  Also check blood sugars about 2 hours after meals and do this after different meals by rotation  Recommended blood sugar levels on waking up are 90-130 and about 2 hours after meal is 130-160  Please bring your blood sugar monitor to each visit, thank you  Take extra Humalog insulin for hi fat meals  Take 18 units when on farxiga at 10 pm  No Lisinopril with [place] Glade

## 2018-03-10 NOTE — Progress Notes (Signed)
Patient ID: Matthew [name], male   DOB: 11/30/1945, 72 y.o.   MRN: [id number]           Reason for Appointment: Followup for Type 2 Diabetes  Referring physician: Criss Rosales  History of Present Illness:          Diagnosis: Type 2 diabetes mellitus, date of diagnosis:  1992      Past history: His blood sugar was high at diagnosis when he was having a routine screening done. He was started on Glucophage initially which he took for about a year. He thinks it did not help his sugar much and he did not feel good with it Apparently he was trying to control his diabetes with diet and exercise for a few years. He may have tried Glucophage again before going on insulin. Also was started on Actos which caused swelling He has been treated mostly with premixed insulin for about 15 years and his level of control appears to be inadequate although details of previous treatment are not available. He may have taken Humalog at one time for use with high sugars A1c was 11.5 in 2011 On his initial consultation in 9/15 because of poor control with premixed insulin he was switched to Levemir twice a day and Humalog with meals.  Recent history:   INSULIN regimen is described as:20-22 N at 1 am; Humalog 15-16 units with meals   His A1c is surprisingly better at 8.1 compared to 9.5  He has again not followed the recommendations made on his last visit   Current management, blood sugar patterns and problems:  He says that he had misplaced his meter and has not done any readings for the last 3 weeks  Also for the last 3 or 4 weeks he is taking his NPH in the morning instead of at bedtime because he forgets to do it at bedtime  However his blood sugar is high today in the afternoon which he thinks is because he had a large dessert last night without taking any extra insulin  FASTING blood sugars otherwise appear to be mostly high with 3 of the 4 readings above 200  He does not routinely check postprandial  readings but has 2 readings last month, one was normal and one was 347 at about 8 PM  FASTING blood sugar in the lab was also high  He takes the same amount of Humalog regardless of what he is eating  Also he had a couple of episodes of low sugars in September and he does not know why since he thinks he was eating his normal diet with that  He was given [place] but he was afraid of side effects and did not start this.  Also did not call to discuss this    With hypoglycemia he has symptoms of weakness, sweating, shakiness and confusion Treating low sugars with orange juice       Oral hypoglycemic drugs the patient is taking are: None      Side effects from medications have been: Edema from Actos,? Nausea and malaise from metformin Compliance with the medical regimen: Fair  Glucose monitoring:  done less than one time a day         Glucometer: One Touch.      Blood Glucose readings:   PRE-MEAL Fasting Lunch Dinner Bedtime Overall  Glucose range:  97-313   62  56   Mean      145+/-116   POST-MEAL PC Breakfast PC Lunch PC Dinner  Glucose range:    44-347  Mean/median:        Self-care:   Meals: 2-3 meals per day.  Bfst 8:30-9, 1 pm Dinner is about 8 PM.  Some meals are high fat       Exercise: Some yardwork         Dietician visit, most recent:?  15 years ago.      CDE visit: 3/16            Weight history:  Wt Readings from Last 3 Encounters:  03/10/18 206 lb (93.4 kg)  11/02/17 201 lb 12.8 oz (91.5 kg)  08/24/17 204 lb (92.5 kg)    Glycemic control:   Lab Results  Component Value Date   HGBA1C 8.1 (A) 03/10/2018   HGBA1C 9.5 (H) 10/21/2017   HGBA1C 9.2 (H) 07/06/2017   Lab Results  Component Value Date   MICROALBUR 5.7 (H) 03/07/2018   LDLCALC 153 (H) 07/06/2017   CREATININE 1.09 03/07/2018    Office Visit on 03/10/2018  Component Date Value Ref Range Status  . Hemoglobin A1C 03/10/2018 8.1* 4.0 - 5.6 % Final  . POC Glucose 03/10/2018 207* 70 - 99  mg/dl Final  Lab on 03/07/2018  Component Date Value Ref Range Status  . Microalb, Ur 03/07/2018 5.7* 0.0 - 1.9 mg/dL Final  . Creatinine,U 03/07/2018 88.7  mg/dL Final  . Microalb Creat Ratio 03/07/2018 6.4  0.0 - 30.0 mg/g Final  . Sodium 03/07/2018 136  135 - 145 mEq/L Final  . Potassium 03/07/2018 4.7  3.5 - 5.1 mEq/L Final  . Chloride 03/07/2018 100  96 - 112 mEq/L Final  . CO2 03/07/2018 28  19 - 32 mEq/L Final  . Glucose, Bld 03/07/2018 279* 70 - 99 mg/dL Final  . BUN 03/07/2018 26* 6 - 23 mg/dL Final  . Creatinine, Ser 03/07/2018 1.09  0.40 - 1.50 mg/dL Final  . Calcium 03/07/2018 9.2  8.4 - 10.5 mg/dL Final  . GFR 03/07/2018 71.11  >60.00 mL/min Final  . Fructosamine 03/07/2018 321* 0 - 285 umol/L Final   Comment: Published reference interval for apparently healthy subjects between age 26 and 40 is 71 - 285 umol/L and in a poorly controlled diabetic population is 228 - 563 umol/L with a mean of 396 umol/L.       Allergies as of 03/10/2018      Reactions   Actos [pioglitazone] Swelling   Bee Venom Swelling   Codeine Hives, Nausea Only   Statins Other (See Comments)   Muscle pain, fatigue   Glucophage [metformin Hcl] Nausea Only   Raspberry Itching      Medication List        Accurate as of 03/10/18  2:23 PM. Always use your most recent med list.          BD VEO INSULIN SYRINGE U/F 31G X 15/64" 0.5 ML Misc Generic drug:  Insulin Syringe-Needle U-100 USE TO INJECT INSULIN 4 TIMES A DAY   bimatoprost 0.01 % Soln Commonly known as:  LUMIGAN Place 1 drop into the left eye at bedtime.   Co Q 10 100 MG Caps Take 1 capsule by mouth daily.   dapagliflozin propanediol 5 MG Tabs tablet Commonly known as:  FARXIGA Take 5 mg by mouth daily.   ELIQUIS 5 MG Tabs tablet Generic drug:  apixaban TAKE 1 TABLET BY MOUTH TWICE A DAY   glucose blood test strip 1 each by Other route as needed for other. Use as instructed  insulin NPH Human 100 UNIT/ML  injection Commonly known as:  HUMULIN N,NOVOLIN N INJECT 26 UNITS IN THE SKIN DAILY   insulin NPH Human 100 UNIT/ML injection Commonly known as:  HUMULIN N,NOVOLIN N INJECT 26 UNITS IN THE SKIN DAILY   lisinopril 5 MG tablet Commonly known as:  PRINIVIL,ZESTRIL TAKE 1 TABLET BY MOUTH EVERY DAY   metoprolol tartrate 50 MG tablet Commonly known as:  LOPRESSOR TAKE 1/2 TABLET TWICE DAILY   NOVOLOG 100 UNIT/ML injection Generic drug:  insulin aspart INJECT 12-15 UNITS IN THE SKIN 3 TIMES DAILY       Allergies:  Allergies  Allergen Reactions  . Actos [Pioglitazone] Swelling  . Bee Venom Swelling  . Codeine Hives and Nausea Only  . Statins Other (See Comments)    Muscle pain, fatigue  . Glucophage [Metformin Hcl] Nausea Only  . Raspberry Itching    Past Medical History:  Diagnosis Date  . Anemia   . Arthritis    "back, left ankle" (11/20/2015)  . CHF (congestive heart failure) ([place])   . Chronic lower back pain   . Coronary artery disease    a. 05/2009 CABG x 3: LIMA->LAD, VG->OM, VG->PDA; b. Nuc 01/2014: inf-lat scar but no ischemia, EF 57%.  . Diabetic retinopathy ([facility])    mild- Dr. Herbert Deaner  . Glaucoma   . Hyperlipidemia    a. Intolerant of lipitor and vytorin.  . Migraine    "once or twice" (11/20/2015)  . Myocardial infarction [facility])    'saw evidence of it on an EKG done in 2005"  . Paroxysmal atrial flutter ([place])    a. 01/2014 s/p DCCV;  b. CHA2DS2VASc = 3-->Eliquis.  . Pneumonia 1960  . PTSD (post-traumatic stress disorder)    "X 2 yr post OHS" (10/31/2014)  . Sacral pain    "right"  . Sciatica of right side   . Testicular cancer ([place]) 1982   "heavy doses of radiation; it was stage 2"  . Type II diabetes mellitus ([place])   . Valvular heart disease    a. 01/2014 Echo: Ef 60-65%, no rwma, mild AI/MS, mod MR, mildly dil LA.    Past Surgical History:  Procedure Laterality Date  . ABDOMINAL EXPLORATION SURGERY  ~ 2005   "for hernia, but didn't have one"  .  ATRIAL FIBRILLATION ABLATION N/A 09/17/2016   Procedure: Atrial Fibrillation Ablation;  Surgeon: [name], MD;  Location: [place] [facility];  Service: Cardiovascular;  Laterality: N/A;  . CARDIAC CATHETERIZATION  2011  . CARDIOVERSION N/A 02/19/2014   Procedure: CARDIOVERSION;  Surgeon: [name], MD;  Location: [facility] ENDOSCOPY;  Service: Cardiovascular;  Laterality: N/A;  . CARDIOVERSION N/A 10/28/2015   Procedure: CARDIOVERSION;  Surgeon: [name], MD;  Location: [facility] ENDOSCOPY;  Service: Cardiovascular;  Laterality: N/A;  . CARDIOVERSION  11/20/2015   "200 joules"  . CORONARY ARTERY BYPASS GRAFT  05/2009   LIMA-LAD, SVG-OM, SVG-PDA 06/20/09  . ELBOW FRACTURE SURGERY Left    broken ulna on left elbow-surgical repair  . ELECTROPHYSIOLOGIC STUDY N/A 10/31/2014   Procedure: A-Flutter;  Surgeon: [name], MD;  Location: [place] [facility];  Service: Cardiovascular;  Laterality: N/A;  . ELECTROPHYSIOLOGIC STUDY N/A 11/20/2015   Procedure: A-Flutter Ablation;  Surgeon: [name], MD;  Location: [place] [facility];  Service: Cardiovascular;  Laterality: N/A;  . FRACTURE SURGERY    . TESTICLE REMOVAL Right 1982  . TONSILLECTOMY  ~ 48    Family History  Problem Relation Age of Onset  . Diabetes Mother   . Heart disease Father   . Heart attack Father   . Diabetes Father   . Hypertension Father   . Diabetes Sister     Social History:  reports that he quit smoking about 29 years ago. His smoking use included cigarettes. He smoked 0.00 packs per day for 15.00 years. He has never used smokeless tobacco. He reports that he drinks about 3.0 standard drinks of alcohol per week. He reports that he does not use drugs.    Review of Systems         Lipids: He was previously on Vytorin and Lipitor. He thinks he had myalgias and weakness when taking these drugs.  He has been recommended statin drugs in the lipid clinic but he refuses to take them stating that they will  not help him.   Also has significantly increased LDL particle number  He will also not take non-statin medications at all includig Zetia despite explaining how this works   Materials engineer Value Date   CHOL 227 (H) 07/06/2017   HDL 54.00 07/06/2017   LDLCALC 153 (H) 07/06/2017   TRIG 101.0 07/06/2017   CHOLHDL 4 07/06/2017    He has been  treated by cardiologist for his atrial fibrillation    Physical Examination:  BP 126/80   Pulse 96   Wt 206 lb (93.4 kg)   SpO2 98%   BMI 33.25 kg/m     ASSESSMENT:  Diabetes type 2, insulin-dependent and uncontrolled   See history of present illness for detailed discussion of current diabetes management, blood sugar patterns and problems identified  His A1c is 8.1 which is lower than usual  He has done only sporadic blood sugars and none for the last 3 weeks Appears to have consistently high fasting readings although he has 1 normal fasting reading last month Currently taking NPH in the morning since the cannot remember to take it at bedtime Also may not take this every morning  Also does not routinely check blood sugars after meals But not adjusting his mealtime dose based on what he is eating which may cause variable postprandial response  His diet is still inconsistent with sometimes higher fat meals including pizza, desserts Has gained weight partly from not exercising He has been recommended [place] but he had refused to started on the last visit   He says he has had the flu vaccine  PLAN:    After much explanation of the benefits of [place] including cardiovascular and renal benefits he agrees to start this, he will stop the lisinopril at the same time  He will need to follow-up in 6 weeks  Most likely will need less insulin and may start with 18 of NPH  He needs to take the NPH at 10 PM and then arbitrarily in the morning  Check blood sugars consistently fasting and at least once a day after 1 of the  meals to help adjust his mealtime insulin  Consultation with dietitian to help him understand meal planning, how to adjust his mealtime dose based on what he is eating  Needs to cut back on high fat foods  Needs to cover extra snacks at night with Humalog also  Encouraged him to consider water exercises but he does not want to do this    Patient Instructions  Check blood sugars on waking up 5 days a week  Also check blood sugars about 2 hours after  meals and do this after different meals by rotation  Recommended blood sugar levels on waking up are 90-130 and about 2 hours after meal is 130-160  Please bring your blood sugar monitor to each visit, thank you  Take extra Humalog insulin for hi fat meals  Take 18 units when on farxiga at 10 pm  No Lisinopril with Wilder Glade    Counseling time on subjects discussed in assessment and plan sections is over 50% of today's 25 minute visit       Elayne Snare 03/10/2018, 2:23 PM   Note: This office note was prepared with Dragon voice recognition system technology. Any transcriptional errors that result from this process are unintentional.

## 2018-04-01 ENCOUNTER — Other Ambulatory Visit: Payer: Self-pay | Admitting: Endocrinology

## 2018-04-08 ENCOUNTER — Other Ambulatory Visit: Payer: Self-pay | Admitting: Endocrinology

## 2018-04-13 ENCOUNTER — Other Ambulatory Visit: Payer: Self-pay | Admitting: Endocrinology

## 2018-04-25 ENCOUNTER — Other Ambulatory Visit: Payer: Medicare HMO

## 2018-04-28 ENCOUNTER — Ambulatory Visit: Payer: Medicare HMO | Admitting: Endocrinology

## 2018-05-06 ENCOUNTER — Other Ambulatory Visit: Payer: Self-pay | Admitting: Endocrinology

## 2018-05-06 ENCOUNTER — Telehealth: Payer: Self-pay | Admitting: Endocrinology

## 2018-05-06 NOTE — Telephone Encounter (Signed)
Rx sent 

## 2018-05-06 NOTE — Telephone Encounter (Signed)
Patient stated That the pharmacy has sent over a refill request for his syringes and he is needing these before the weekend     BD VEO INSULIN SYRINGE U/F 31G X 15/64" 0.5 ML MISC   [id number] - [place], [place] [address]

## 2018-05-09 ENCOUNTER — Other Ambulatory Visit: Payer: Self-pay | Admitting: Endocrinology

## 2018-06-06 DIAGNOSIS — E113393 Type 2 diabetes mellitus with moderate nonproliferative diabetic retinopathy without macular edema, bilateral: Secondary | ICD-10-CM | POA: Diagnosis not present

## 2018-06-06 DIAGNOSIS — H2513 Age-related nuclear cataract, bilateral: Secondary | ICD-10-CM | POA: Diagnosis not present

## 2018-06-06 DIAGNOSIS — H25013 Cortical age-related cataract, bilateral: Secondary | ICD-10-CM | POA: Diagnosis not present

## 2018-06-06 DIAGNOSIS — H4032X0 Glaucoma secondary to eye trauma, left eye, stage unspecified: Secondary | ICD-10-CM | POA: Diagnosis not present

## 2018-06-06 DIAGNOSIS — H40021 Open angle with borderline findings, high risk, right eye: Secondary | ICD-10-CM | POA: Diagnosis not present

## 2018-06-06 LAB — HM DIABETES EYE EXAM

## 2018-06-21 DIAGNOSIS — H4032X0 Glaucoma secondary to eye trauma, left eye, stage unspecified: Secondary | ICD-10-CM | POA: Diagnosis not present

## 2018-06-21 DIAGNOSIS — H2513 Age-related nuclear cataract, bilateral: Secondary | ICD-10-CM | POA: Diagnosis not present

## 2018-06-21 DIAGNOSIS — E113393 Type 2 diabetes mellitus with moderate nonproliferative diabetic retinopathy without macular edema, bilateral: Secondary | ICD-10-CM | POA: Diagnosis not present

## 2018-06-21 DIAGNOSIS — H25013 Cortical age-related cataract, bilateral: Secondary | ICD-10-CM | POA: Diagnosis not present

## 2018-06-21 DIAGNOSIS — H2511 Age-related nuclear cataract, right eye: Secondary | ICD-10-CM | POA: Diagnosis not present

## 2018-06-24 ENCOUNTER — Telehealth: Payer: Self-pay | Admitting: Cardiology

## 2018-06-24 MED ORDER — FUROSEMIDE 40 MG PO TABS: 40.0000 mg | ORAL_TABLET | Freq: Every day | ORAL | 1 refills | Status: DC | PRN

## 2018-06-24 NOTE — Telephone Encounter (Signed)
°*  STAT* If patient is at the pharmacy, call can be transferred to refill team.   1. Which medications need to be refilled? (please list name of each medication and dose if known) furosemide (LASIX) tablet 40 mg   2. Which pharmacy/location (including street and city if local pharmacy) is medication to be sent to? [id number] - [place], [place] - [address] AT [place]  3. Do they need a 30 day or 90 day supply? 90 days

## 2018-06-24 NOTE — Telephone Encounter (Signed)
Pt is requested a refill on lasix but medication is not listed on pt's current list.  Please advise

## 2018-06-24 NOTE — Telephone Encounter (Signed)
Pt wasn't taking it for awhile, but has recently restarted PRN. Informed that I would send Rx in. Pt appreciative and will see [place] next month at his yearly OV.

## 2018-06-28 DIAGNOSIS — L570 Actinic keratosis: Secondary | ICD-10-CM | POA: Diagnosis not present

## 2018-07-27 ENCOUNTER — Other Ambulatory Visit: Payer: Self-pay | Admitting: Endocrinology

## 2018-08-13 ENCOUNTER — Other Ambulatory Visit: Payer: Self-pay | Admitting: Interventional Cardiology

## 2018-08-17 ENCOUNTER — Ambulatory Visit: Payer: Medicare HMO | Admitting: Pulmonary Disease

## 2018-08-22 IMAGING — CT CT HEART MORPH/PULM VEIN W/ CM & W/O CA SCORE
2 of 10 series · 8 of 20 positions shown, 10 images · IV contrast (OMNI 350)
Comparison: None.

CLINICAL DATA: 70-year-old male with atrial fibrillation scheduled
for an ablation.

EXAM:
Cardiac CT/CTA
TECHNIQUE: The patient was scanned on a Siemens Somatom scanner.

[Series 11: corcta 0.6 i26f 2 40 - 80 % · axial · 0.39mm/px · z∈[-262,-140]mm · 6 of 3825 slices shown, 8 images]
[im 547/3825  vessel]
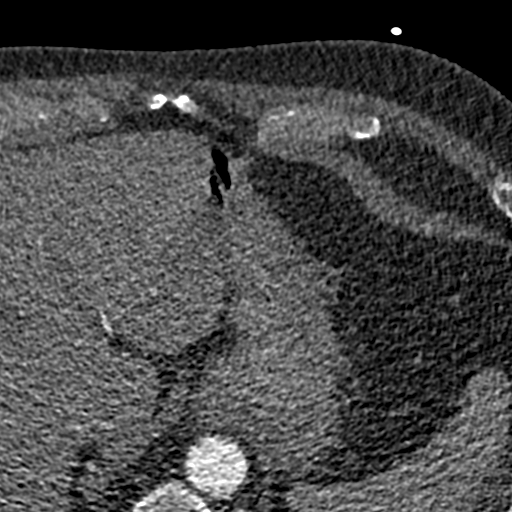
[im 547/3825  lung]
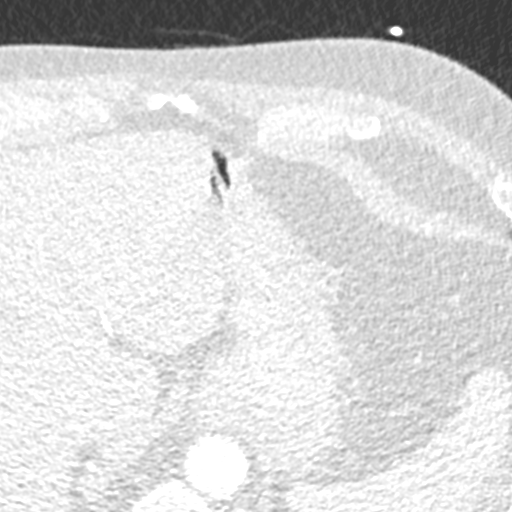
[im 1093/3825  vessel]
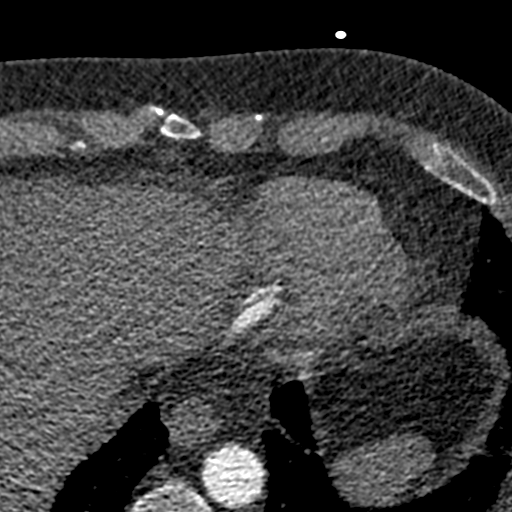
[im 1639/3825  vessel]
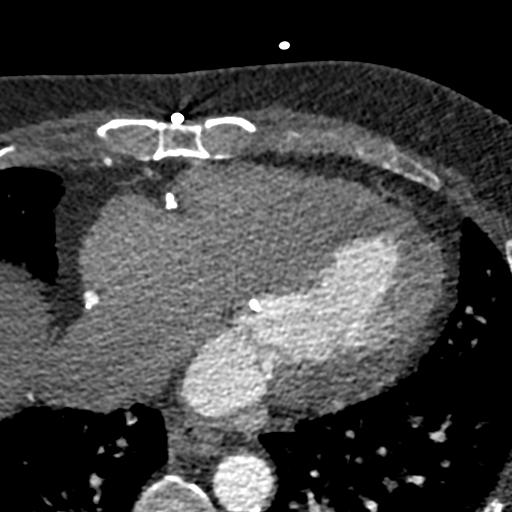
[im 2186/3825  vessel]
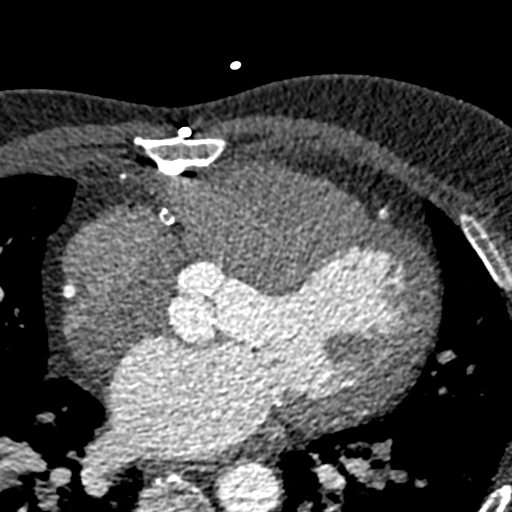
[im 2732/3825  vessel]
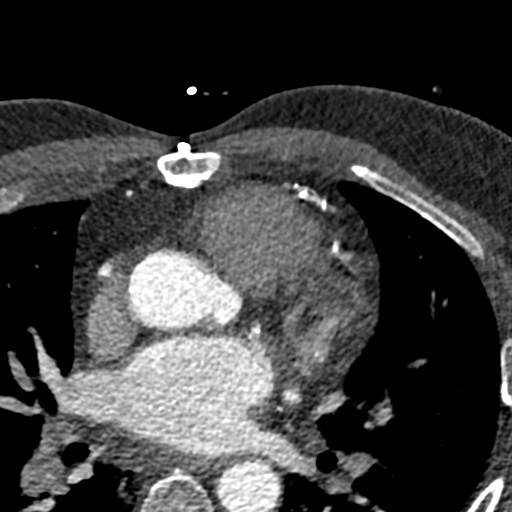
[im 2732/3825  lung]
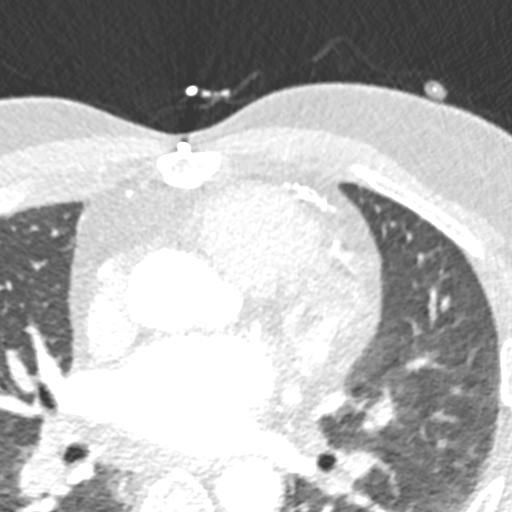
[im 3278/3825  vessel]
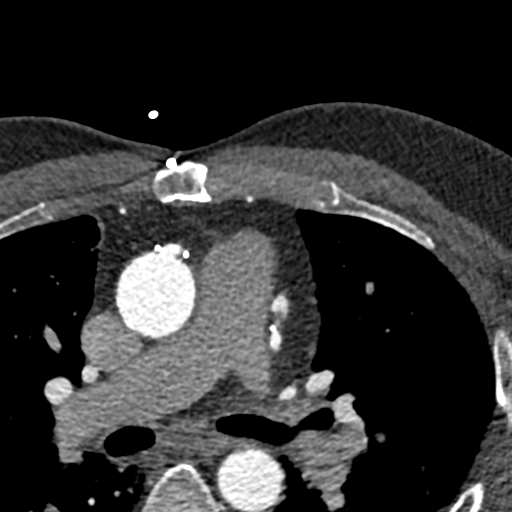

[Series 17: deelay 0.6 i26f 2 40 - 80 % · axial · 0.39mm/px · z∈[-178,-147]mm · 2 of 2079 slices shown]
[im 693/2079  vessel]
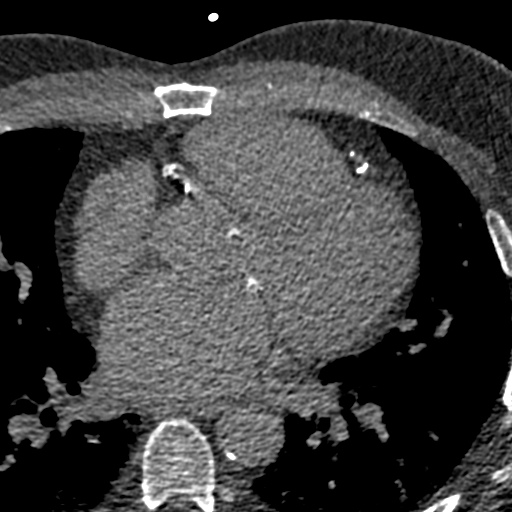
[im 1386/2079  vessel]
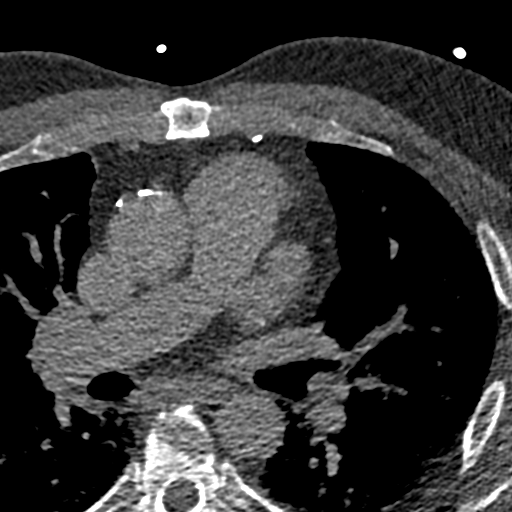

[8 of 20 positions shown; findings below may reference images not displayed]

FINDINGS: A 120 kV prospective scan was triggered in the descending thoracic
aorta at 111 HU's. Gantry rotation speed was 280 msecs and
collimation was .9 mm. No beta blockade and no NTG was given. The 3D
data set was reconstructed in 5% intervals of the 60-80 % of the R-R
cycle. Diastolic phases were analyzed on a dedicated work station
using MPR, MIP and VRT modes. The patient received 80 cc of
contrast.

There is normal pulmonary vein drainage into the left atrium (2 on
the right and 2 on the left) with ostial measurements as follows:

RUPV:  20 x 19 mm

RLPV:  22 x 21 mm

LUPV:  18 x 15 mm

LLPV:  18 x 13 mm

The left atrial appendage is large, with chicken wing morphology
with two lobes and ostial size 21 x 18 mm and length 38 mm. There is
no thrombus in the left atrial appendage.

The esophagus runs to the left from the left atrial midline and is
in the proximity to any of the left upper and lower pulmonary veins.

Aorta:  Normal caliber.  No dissection or calcifications.

Aortic Valve:  Trileaflet.  Mild calcifications.

Mitral annular calcifications.
IMPRESSION: 1. There is normal pulmonary vein drainage into the left atrium.
There is no pulmonary vein stenosis.

2. The left atrial appendage is large, with chicken wing morphology
with two lobes and ostial size 21 x 18 mm and length 38 mm. There is
no thrombus in the left atrial appendage.

3. The esophagus runs to the left from the left atrial midline and
is in the proximity to any of the left upper and lower pulmonary
veins.

Yu Chia Hafizh

EXAM:
OVER-READ INTERPRETATION  CT CHEST

The following report is an over-read performed by radiologist Dr.
over-read does not include interpretation of cardiac or coronary
anatomy or pathology. The coronary calcium score/coronary CTA
interpretation by the cardiologist is attached.
FINDINGS: Aortic atherosclerosis. Within the visualized portions of the thorax
there is no suspicious appearing pulmonary nodule or mass, there is
no acute consolidative airspace disease, no pleural effusions, no
pneumothorax and no lymphadenopathy. There is some septal thickening
throughout the lungs bilaterally, most evident throughout the mid to
lower lungs in the periphery, which could suggest underlying
interstitial lung disease. Visualized portions of the upper abdomen
are unremarkable. Median sternotomy wires. There are no aggressive
appearing lytic or blastic lesions noted in the visualized portions
of the skeleton.
IMPRESSION: 1. Aortic atherosclerosis.
2. Findings in the lungs bilaterally concerning for potential
interstitial lung disease. Followup nonemergent high-resolution
chest CT is recommended in the near future to better evaluate these
findings.

## 2018-09-05 ENCOUNTER — Other Ambulatory Visit: Payer: Self-pay | Admitting: Pharmacist

## 2018-09-05 ENCOUNTER — Other Ambulatory Visit: Payer: Self-pay

## 2018-09-05 DIAGNOSIS — I483 Typical atrial flutter: Secondary | ICD-10-CM

## 2018-09-05 MED ORDER — APIXABAN 5 MG PO TABS: 5.0000 mg | ORAL_TABLET | Freq: Two times a day (BID) | ORAL | 1 refills | Status: DC

## 2018-09-05 MED ORDER — METOPROLOL TARTRATE 50 MG PO TABS: 25.0000 mg | ORAL_TABLET | Freq: Two times a day (BID) | ORAL | 1 refills | Status: DC

## 2018-09-05 NOTE — Telephone Encounter (Signed)
73yo Male Scr = 1.09 on 03/07/18 Last OV 08/17/2017 (F/U visit scheduled for 09/13/2018)

## 2018-09-07 ENCOUNTER — Telehealth: Payer: Self-pay | Admitting: *Deleted

## 2018-09-07 NOTE — Telephone Encounter (Signed)
Calling patient today to discuss  upcoming appointment.  We are currently trying to limit exposure to the virus that causes COVID-19 by seeing patients at home rather than in the office. We would like to schedule this appointment as a Psychologist, counselling. Patient is aware if they decide to reschedule this appointment, they may not be seen or scheduled for the next 4-6 months. Patient at this time declines Virtual Visits. Patient wants to be seen face to face. [name] 17 @ 10:45am Message sent scheduling and nurse.   Concerns and/or Complaints:                    Since your last visit or hospitalization:   1. Have you been having new or worsening chest pain or chest discomfort? NO 2. Have you been having new or worsening shortness of breath? NO 3. Have you been having new or worsening leg swelling, wt gain, or increase in abdominal girth (pants fitting more tightly)? NO 4. Have you had any passing out spells, dizziness, or uncommon headaches? NO  5. Have you had any extreme tiredness or fatigue? NO 6. Have you had any problems with your device or device site (swelling, fever, tenderness, redness, vibrations, beeping)? NO

## 2018-09-07 NOTE — Telephone Encounter (Signed)
Advised pt ok ot wait till [place] for f/u. Pt denies any issues with atrial flutter. He will call if issues arise prior to appt in [place]. He appreciates my follow up

## 2018-09-13 ENCOUNTER — Ambulatory Visit: Payer: Medicare HMO | Admitting: Cardiology

## 2018-10-12 DIAGNOSIS — H2511 Age-related nuclear cataract, right eye: Secondary | ICD-10-CM | POA: Diagnosis not present

## 2018-10-12 DIAGNOSIS — H25811 Combined forms of age-related cataract, right eye: Secondary | ICD-10-CM | POA: Diagnosis not present

## 2018-10-17 DIAGNOSIS — H25012 Cortical age-related cataract, left eye: Secondary | ICD-10-CM | POA: Diagnosis not present

## 2018-10-17 DIAGNOSIS — H2512 Age-related nuclear cataract, left eye: Secondary | ICD-10-CM | POA: Diagnosis not present

## 2018-10-24 ENCOUNTER — Other Ambulatory Visit: Payer: Self-pay | Admitting: Endocrinology

## 2018-10-26 DIAGNOSIS — H25012 Cortical age-related cataract, left eye: Secondary | ICD-10-CM | POA: Diagnosis not present

## 2018-10-26 DIAGNOSIS — H2512 Age-related nuclear cataract, left eye: Secondary | ICD-10-CM | POA: Diagnosis not present

## 2018-10-26 DIAGNOSIS — H52222 Regular astigmatism, left eye: Secondary | ICD-10-CM | POA: Diagnosis not present

## 2018-10-26 DIAGNOSIS — H25812 Combined forms of age-related cataract, left eye: Secondary | ICD-10-CM | POA: Diagnosis not present

## 2018-10-28 ENCOUNTER — Other Ambulatory Visit: Payer: Self-pay | Admitting: Endocrinology

## 2018-11-10 ENCOUNTER — Other Ambulatory Visit: Payer: Self-pay | Admitting: Interventional Cardiology

## 2018-11-27 DIAGNOSIS — R69 Illness, unspecified: Secondary | ICD-10-CM | POA: Diagnosis not present

## 2018-12-12 ENCOUNTER — Ambulatory Visit: Payer: Medicare HMO | Admitting: Cardiology

## 2018-12-12 ENCOUNTER — Other Ambulatory Visit: Payer: Self-pay

## 2018-12-12 ENCOUNTER — Encounter: Payer: Self-pay | Admitting: Cardiology

## 2018-12-12 VITALS — BP 130/72 | HR 81 | Ht 66.0 in | Wt 200.6 lb

## 2018-12-12 DIAGNOSIS — Z79899 Other long term (current) drug therapy: Secondary | ICD-10-CM

## 2018-12-12 DIAGNOSIS — I484 Atypical atrial flutter: Secondary | ICD-10-CM

## 2018-12-12 LAB — BASIC METABOLIC PANEL
BUN/Creatinine Ratio: 17 (ref 10–24)
BUN: 19 mg/dL (ref 8–27)
CO2: 24 mmol/L (ref 20–29)
Calcium: 9.3 mg/dL (ref 8.6–10.2)
Chloride: 99 mmol/L (ref 96–106)
Creatinine, Ser: 1.15 mg/dL (ref 0.76–1.27)
GFR calc Af Amer: 74 mL/min/{1.73_m2} (ref >59)
GFR calc non Af Amer: 64 mL/min/{1.73_m2} (ref >59)
Glucose: 183 mg/dL — ABNORMAL HIGH (ref 65–99)
Potassium: 4.8 mmol/L (ref 3.5–5.2)
Sodium: 139 mmol/L (ref 134–144)

## 2018-12-12 LAB — CBC
Hematocrit: 38.7 % (ref 37.5–51.0)
Hemoglobin: 12.6 g/dL — ABNORMAL LOW (ref 13.0–17.7)
MCH: 29.4 pg (ref 26.6–33.0)
MCHC: 32.6 g/dL (ref 31.5–35.7)
MCV: 90 fL (ref 79–97)
Platelets: 271 10*3/uL (ref 150–450)
RBC: 4.28 x10E6/uL (ref 4.14–5.80)
RDW: 13.1 % (ref 11.6–15.4)
WBC: 7.9 10*3/uL (ref 3.4–10.8)

## 2018-12-12 NOTE — Progress Notes (Signed)
Electrophysiology Office Note   Date:  12/12/2018   ID:  Lawyer L Reinitz, DOB 1945/11/21, MRN [id number]  PCP:  [name], MD  Cardiologist:  Lovena Le Primary Electrophysiologist: [name], MD    No chief complaint on file.    History of Present Illness: Matthew [name] is a 73 y.o. male who is being seen today for the evaluation of atrial flutter at the request of [name], MD. Presenting today for electrophysiology evaluation. history of CAD hx of CABG 2011, AFlutter ablated, recurrent AFlutter with atypical morphology >> EPS noted LA flutter his atrial flutter isthmus was blocked, initially on Tikosyn with prolongation of his QT eventually on amiodarone, DM.  He was put on the amiodarone he felt tired, poor, though with reduction of the dose he did feel well.  Had ablation of typical atrial flutter on 10/31/14.  He subsequently developed a left atrial flutter and had ablation on 09/17/16 for atrial fibrillation and left atrial flutter.  Today, denies symptoms of palpitations, chest pain, shortness of breath, orthopnea, PND, lower extremity edema, claudication, dizziness, presyncope, syncope, bleeding, or neurologic sequela. The patient is tolerating medications without difficulties.  Overall he is doing well.  He is noted no further episodes of atrial fibrillation or atrial flutter.  Past Medical History:  Diagnosis Date  . Anemia   . Arthritis    "back, left ankle" (11/20/2015)  . CHF (congestive heart failure) ([place])   . Chronic lower back pain   . Coronary artery disease    a. 05/2009 CABG x 3: LIMA->LAD, VG->OM, VG->PDA; b. Nuc 01/2014: inf-lat scar but no ischemia, EF 57%.  . Diabetic retinopathy (HCC)    mild- Dr. Herbert Deaner  . Glaucoma   . Hyperlipidemia    a. Intolerant of lipitor and vytorin.  . Migraine    "once or twice" (11/20/2015)  . Myocardial infarction [facility])    'saw evidence of it on an EKG done in 2005"  . Paroxysmal atrial flutter ([place])     a. 01/2014 s/p DCCV;  b. CHA2DS2VASc = 3-->Eliquis.  . Pneumonia 1960  . PTSD (post-traumatic stress disorder)    "X 2 yr post OHS" (10/31/2014)  . Sacral pain    "right"  . Sciatica of right side   . Testicular cancer ([place]) 1982   "heavy doses of radiation; it was stage 2"  . Type II diabetes mellitus ([place])   . Valvular heart disease    a. 01/2014 Echo: Ef 60-65%, no rwma, mild AI/MS, mod MR, mildly dil LA.   Past Surgical History:  Procedure Laterality Date  . ABDOMINAL EXPLORATION SURGERY  ~ 2005   "for hernia, but didn't have one"  . ATRIAL FIBRILLATION ABLATION N/A 09/17/2016   Procedure: Atrial Fibrillation Ablation;  Surgeon: [name], MD;  Location: [place] CV LAB;  Service: Cardiovascular;  Laterality: N/A;  . CARDIAC CATHETERIZATION  2011  . CARDIOVERSION N/A 02/19/2014   Procedure: CARDIOVERSION;  Surgeon: [name], MD;  Location: [facility] ENDOSCOPY;  Service: Cardiovascular;  Laterality: N/A;  . CARDIOVERSION N/A 10/28/2015   Procedure: CARDIOVERSION;  Surgeon: [name], MD;  Location: [facility] ENDOSCOPY;  Service: Cardiovascular;  Laterality: N/A;  . CARDIOVERSION  11/20/2015   "200 joules"  . CORONARY ARTERY BYPASS GRAFT  05/2009   LIMA-LAD, SVG-OM, SVG-PDA 06/20/09  . ELBOW FRACTURE SURGERY Left    broken ulna on left elbow-surgical repair  . ELECTROPHYSIOLOGIC STUDY N/A 10/31/2014   Procedure: A-Flutter;  Surgeon: Carleene Overlie  Peyton Najjar, MD;  Location: [place] CV LAB;  Service: Cardiovascular;  Laterality: N/A;  . ELECTROPHYSIOLOGIC STUDY N/A 11/20/2015   Procedure: A-Flutter Ablation;  Surgeon: [name], MD;  Location: [place] CV LAB;  Service: Cardiovascular;  Laterality: N/A;  . FRACTURE SURGERY    . TESTICLE REMOVAL Right 1982  . TONSILLECTOMY  ~ 1967     Current Outpatient Medications  Medication Sig Dispense Refill  . apixaban (ELIQUIS) 5 MG TABS tablet Take 1 tablet (5 mg total) by mouth 2 (two) times daily. 180 tablet 1  . BD VEO  INSULIN SYRINGE U/F 31G X 15/64" 0.5 ML MISC USE TO INJECT INSULIN 4 TIMES A DAY 300 each 1  . bimatoprost (LUMIGAN) 0.01 % SOLN Place 1 drop into the left eye at bedtime.    . furosemide (LASIX) 40 MG tablet Take 1 tablet (40 mg total) by mouth daily as needed. 90 tablet 1  . glucose blood (ONETOUCH VERIO) test strip 1 each by Other route as needed for other. Use as instructed 100 each 3  . insulin NPH Human (NOVOLIN N) 100 UNIT/ML injection INJECT 26 UNITS IN THE SKIN DAILY (Patient taking differently: INJECT 22 UNITS IN THE SKIN DAILY) 10 mL 1  . lisinopril (ZESTRIL) 5 MG tablet TAKE 1 TABLET BY MOUTH DAILY. PT MUST KEEP UPCOMING APPT IN ORDER TO RECEIVE MORE REFILLS 90 tablet 0  . metoprolol tartrate (LOPRESSOR) 50 MG tablet Take 0.5 tablets (25 mg total) by mouth 2 (two) times daily. 90 tablet 1  . NOVOLOG 100 UNIT/ML injection INJECT 12-15 UNITS IN THE SKIN 3 TIMES DAILY 30 mL 1   No current facility-administered medications for this visit.     Allergies:   Actos [pioglitazone], Bee venom, Codeine, Statins, Glucophage [metformin hcl], and Raspberry   Social History:  The patient  reports that he quit smoking about 30 years ago. His smoking use included cigarettes. He smoked 0.00 packs per day for 15.00 years. He has never used smokeless tobacco. He reports current alcohol use of about 3.0 standard drinks of alcohol per week. He reports that he does not use drugs.   Family History:  The patient's family history includes Diabetes in his father, mother, and sister; Heart attack in his father; Heart disease in his father; Hypertension in his father.    ROS:  Please see the history of present illness.   Otherwise, review of systems is positive for none.   All other systems are reviewed and negative.   PHYSICAL EXAM: VS:  BP 130/72   Pulse 81   Ht 5\' 6"  (1.676 m)   Wt 200 lb 9.6 oz (91 kg)   SpO2 96%   BMI 32.38 kg/m  , BMI Body mass index is 32.38 kg/m. GEN: Well nourished, well  developed, in no acute distress  HEENT: normal  Neck: no JVD, carotid bruits, or masses Cardiac: RRR; no murmurs, rubs, or gallops,no edema  Respiratory:  clear to auscultation bilaterally, normal work of breathing GI: soft, nontender, nondistended, + BS MS: no deformity or atrophy  Skin: warm and dry Neuro:  Strength and sensation are intact Psych: euthymic mood, full affect  EKG:  EKG is ordered today. Personal review of the ekg ordered shows rhythm, right bundle branch block, inferior Q waves  Recent Labs: 03/07/2018: BUN 26; Creatinine, Ser 1.09; Potassium 4.7; Sodium 136    Lipid Panel     Component Value Date/Time   CHOL 227 (H) 07/06/2017 0910  CHOL 216 (H) 06/21/2013 1405   TRIG 101.0 07/06/2017 0910   TRIG 132 06/21/2013 1405   HDL 54.00 07/06/2017 0910   HDL 54 06/21/2013 1405   CHOLHDL 4 07/06/2017 0910   VLDL 20.2 07/06/2017 0910   LDLCALC 153 (H) 07/06/2017 0910   LDLCALC 136 (H) 06/21/2013 1405     Wt Readings from Last 3 Encounters:  12/12/18 200 lb 9.6 oz (91 kg)  03/10/18 206 lb (93.4 kg)  11/02/17 201 lb 12.8 oz (91.5 kg)      Other studies Reviewed: Additional studies/ records that were reviewed today include: TTE  08/21/16 Review of the above records today demonstrates:  - Left ventricle: The cavity size was normal. Wall thickness was   increased in a pattern of mild LVH. Systolic function was normal.   The estimated ejection fraction was in the range of 55% to 60%.   Wall motion was normal; there were no regional wall motion   abnormalities. Features are consistent with a pseudonormal left   ventricular filling pattern, with concomitant abnormal relaxation   and increased filling pressure (grade 2 diastolic dysfunction).   Doppler parameters are consistent with high ventricular filling   pressure. - Mitral valve: Severely calcified annulus. There was mild   regurgitation. - Left atrium: The atrium was mildly dilated.   ASSESSMENT AND  PLAN:  1.  Atypical atrial flutter: Status post ablation 09/17/2016.  Currently on Eliquis.  Remains in sinus rhythm.  This patients CHA2DS2-VASc Score and unadjusted Ischemic Stroke Rate (% per year) is equal to 4.8 % stroke rate/year from a score of 4  Above score calculated as 1 point each if present [CHF, HTN, DM, Vascular=MI/PAD/Aortic Plaque, Age if 65-74, or Male] Above score calculated as 2 points each if present [Age > 75, or Stroke/TIA/TE]   2. Coronary artery disease: No current chest pain.  Continue with current management.  Current medicines are reviewed at length with the patient today.   The patient does not have concerns regarding his medicines.  The following changes were made today: None  Labs/ tests ordered today include:  Orders Placed This Encounter  Procedures  . Basic metabolic panel  . CBC  . EKG 12-Lead     Disposition:   FU with Will Camnitz 12 months  Signed, Will Meredith Leeds, MD  12/12/2018 11:04 AM     [facility] [address] [place] [address] [phone] (office) [phone] (fax)

## 2018-12-12 NOTE — Patient Instructions (Addendum)
Medication Instructions:  Your physician recommends that you continue on your current medications as directed. Please refer to the Current Medication list given to you today.  * If you need a refill on your cardiac medications before your next appointment, please call your pharmacy.   Labwork: Today: BMET & CBC *We will only notify you of abnormal results, otherwise continue current treatment plan.  Testing/Procedures: None ordered  Follow-Up: Your physician wants you to follow-up in: 1 year with Dr. Curt Bears.  You will receive a reminder letter in the mail two months in advance. If you don't receive a letter, please call our office to schedule the follow-up appointment.  Thank you for choosing [facility]!!   Trinidad Curet, RN [phone]

## 2018-12-20 ENCOUNTER — Other Ambulatory Visit: Payer: Self-pay | Admitting: Endocrinology

## 2018-12-21 ENCOUNTER — Other Ambulatory Visit: Payer: Self-pay

## 2018-12-21 ENCOUNTER — Telehealth: Payer: Self-pay | Admitting: Endocrinology

## 2018-12-21 MED ORDER — INSULIN NPH (HUMAN) (ISOPHANE) 100 UNIT/ML ~~LOC~~ SUSP: SUBCUTANEOUS | 0 refills | Status: DC

## 2018-12-21 MED ORDER — "BD VEO INSULIN SYRINGE U/F 31G X 15/64"" 0.5 ML MISC": 1 refills | Status: DC

## 2018-12-21 NOTE — Telephone Encounter (Signed)
Please call to let him know that we will refill once he makes an appointment

## 2018-12-21 NOTE — Telephone Encounter (Signed)
Rx sent to sustain pt until pt can be seen.

## 2018-12-21 NOTE — Telephone Encounter (Signed)
Pt has not been seen since November 2019. Refill or deny?

## 2018-12-21 NOTE — Telephone Encounter (Signed)
Patient is scheduled for next available on 01/17/19, unable to come in earlier.

## 2018-12-21 NOTE — Telephone Encounter (Signed)
Please schedule pt for appt as soon as possible.

## 2018-12-21 NOTE — Telephone Encounter (Signed)
Pt called stating he needs a refill for BD VEO INSULIN SYRINGE U/F 31G X 15/64" 0.5 ML MISC  insulin NPH Human (NOVOLIN N) 100 UNIT/ML injection  Pharmacy is [id number] - [place], [place]  Call pt @ [phone] Thank you!

## 2019-01-12 ENCOUNTER — Other Ambulatory Visit: Payer: Medicare HMO

## 2019-01-12 ENCOUNTER — Other Ambulatory Visit (INDEPENDENT_AMBULATORY_CARE_PROVIDER_SITE_OTHER): Payer: Medicare HMO

## 2019-01-12 ENCOUNTER — Other Ambulatory Visit: Payer: Self-pay | Admitting: Endocrinology

## 2019-01-12 ENCOUNTER — Other Ambulatory Visit: Payer: Self-pay

## 2019-01-12 DIAGNOSIS — E1165 Type 2 diabetes mellitus with hyperglycemia: Secondary | ICD-10-CM

## 2019-01-12 DIAGNOSIS — Z794 Long term (current) use of insulin: Secondary | ICD-10-CM | POA: Diagnosis not present

## 2019-01-12 DIAGNOSIS — E7800 Pure hypercholesterolemia, unspecified: Secondary | ICD-10-CM

## 2019-01-12 LAB — LIPID PANEL
Cholesterol: 226 mg/dL — ABNORMAL HIGH (ref 0–200)
HDL: 51.7 mg/dL (ref >39.00)
LDL Cholesterol: 154 mg/dL — ABNORMAL HIGH (ref 0–99)
NonHDL: 174.38
Total CHOL/HDL Ratio: 4
Triglycerides: 101 mg/dL (ref 0.0–149.0)
VLDL: 20.2 mg/dL (ref 0.0–40.0)

## 2019-01-12 LAB — COMPREHENSIVE METABOLIC PANEL
ALT: 15 U/L (ref 0–53)
AST: 21 U/L (ref 0–37)
Albumin: 3.7 g/dL (ref 3.5–5.2)
Alkaline Phosphatase: 76 U/L (ref 39–117)
BUN: 19 mg/dL (ref 6–23)
CO2: 29 mEq/L (ref 19–32)
Calcium: 9.3 mg/dL (ref 8.4–10.5)
Chloride: 99 mEq/L (ref 96–112)
Creatinine, Ser: 1.06 mg/dL (ref 0.40–1.50)
GFR: 68.92 mL/min (ref >60.00)
Glucose, Bld: 253 mg/dL — ABNORMAL HIGH (ref 70–99)
Potassium: 4.7 mEq/L (ref 3.5–5.1)
Sodium: 135 mEq/L (ref 135–145)
Total Bilirubin: 0.5 mg/dL (ref 0.2–1.2)
Total Protein: 6.7 g/dL (ref 6.0–8.3)

## 2019-01-12 LAB — HEMOGLOBIN A1C: Hgb A1c MFr Bld: 8.2 % — ABNORMAL HIGH (ref 4.6–6.5)

## 2019-01-14 ENCOUNTER — Other Ambulatory Visit: Payer: Self-pay | Admitting: Endocrinology

## 2019-01-17 ENCOUNTER — Other Ambulatory Visit: Payer: Self-pay

## 2019-01-17 ENCOUNTER — Ambulatory Visit (INDEPENDENT_AMBULATORY_CARE_PROVIDER_SITE_OTHER): Payer: Medicare HMO | Admitting: Endocrinology

## 2019-01-17 ENCOUNTER — Encounter: Payer: Self-pay | Admitting: Endocrinology

## 2019-01-17 DIAGNOSIS — E1165 Type 2 diabetes mellitus with hyperglycemia: Secondary | ICD-10-CM

## 2019-01-17 DIAGNOSIS — Z794 Long term (current) use of insulin: Secondary | ICD-10-CM

## 2019-01-17 DIAGNOSIS — E7800 Pure hypercholesterolemia, unspecified: Secondary | ICD-10-CM | POA: Diagnosis not present

## 2019-01-17 NOTE — Progress Notes (Signed)
Patient ID: Matthew [name], male   DOB: 1945-08-08, 73 y.o.   MRN: [id number]           Reason for Appointment: Followup for Type 2 Diabetes  Today's office visit was provided via telemedicine using a telephone call to the patient Patient has been explained the limitations of evaluation and management by telemedicine and the availability of in person appointments.  The patient understood the limitations and agreed to proceed. Patient also understood that the telehealth visit is billable. . Location of the patient: Home . Location of the provider: Office Only the patient and myself were participating in the encounter  History of Present Illness:          Diagnosis: Type 2 diabetes mellitus, date of diagnosis:  1992      Past history: His blood sugar was high at diagnosis when he was having a routine screening done. He was started on Glucophage initially which he took for about a year. He thinks it did not help his sugar much and he did not feel good with it Apparently he was trying to control his diabetes with diet and exercise for a few years. He may have tried Glucophage again before going on insulin. Also was started on Actos which caused swelling He has been treated mostly with premixed insulin for about 15 years and his level of control appears to be inadequate although details of previous treatment are not available. He may have taken Humalog at one time for use with high sugars A1c was 11.5 in 2011 On his initial consultation in 9/15 because of poor control with premixed insulin he was switched to Levemir twice a day and Humalog with meals.  Recent history:   INSULIN regimen is described as:20-22 N at 9 am; Humalog 15-16 units with meals   His A1c is about the same at 8.2, was 8.1  Has not been seen in follow-up since 11/19   Current management, blood sugar patterns and problems:    He is only taking NPH once a day and previously had stopped taking it at bedtime because he  would forget to do so  He was also tried on [place] last year but he says he had excessive frequent urination and stopped it after a month  Unable to get an idea what his blood sugar patterns are since he only checks blood sugars when he feels hypoglycemic  In the last month he has only a few blood sugars during the night mostly between 1-4 AM which range from 47-63  Also at 11 PM he had a glucose of 62  He usually takes Humalog with his meals, evening meal is generally 7-8 PM  Also will take Humalog at lunch but may not take it at breakfast when he is eating a lighter meal  At times will also feel hypoglycemic around 4 PM  Not clear what his blood sugars are in the morning waking up and likely had a high sugar on the lab because of a low sugar 94 treated with 2 glasses of juice    With hypoglycemia he has symptoms of weakness, sweating, shakiness and confusion Treating low sugars with orange juice       Oral hypoglycemic drugs the patient is taking are: None      Side effects from medications have been: Edema from Actos,? Nausea and malaise from metformin Compliance with the medical regimen: Fair  Glucose monitoring:  done less than one time a day  Glucometer: One Touch.      Blood Glucose readings: As above   Previous readings:  PRE-MEAL Fasting Lunch Dinner Bedtime Overall  Glucose range:  97-313   62  56   Mean      145+/-116   POST-MEAL PC Breakfast PC Lunch PC Dinner  Glucose range:    44-347  Mean/median:        Self-care:   Meals: 2-3 meals per day.  Bfst 8:30-9, 1 pm Dinner is about 8 PM.  Some meals are high fat       Exercise: Some yardwork         Dietician visit, most recent:?  15 years ago.      CDE visit: 3/16            Weight history:  Wt Readings from Last 3 Encounters:  12/12/18 200 lb 9.6 oz (91 kg)  03/10/18 206 lb (93.4 kg)  11/02/17 201 lb 12.8 oz (91.5 kg)    Glycemic control:   Lab Results  Component Value Date   HGBA1C  8.2 (H) 01/12/2019   HGBA1C 8.1 (A) 03/10/2018   HGBA1C 9.5 (H) 10/21/2017   Lab Results  Component Value Date   MICROALBUR 5.7 (H) 03/07/2018   LDLCALC 154 (H) 01/12/2019   CREATININE 1.06 01/12/2019    Lab on 01/12/2019  Component Date Value Ref Range Status  . Sodium 01/12/2019 135  135 - 145 mEq/L Final  . Potassium 01/12/2019 4.7  3.5 - 5.1 mEq/L Final  . Chloride 01/12/2019 99  96 - 112 mEq/L Final  . CO2 01/12/2019 29  19 - 32 mEq/L Final  . Glucose, Bld 01/12/2019 253* 70 - 99 mg/dL Final  . BUN 01/12/2019 19  6 - 23 mg/dL Final  . Creatinine, Ser 01/12/2019 1.06  0.40 - 1.50 mg/dL Final  . Total Bilirubin 01/12/2019 0.5  0.2 - 1.2 mg/dL Final  . Alkaline Phosphatase 01/12/2019 76  39 - 117 U/L Final  . AST 01/12/2019 21  0 - 37 U/L Final  . ALT 01/12/2019 15  0 - 53 U/L Final  . Total Protein 01/12/2019 6.7  6.0 - 8.3 g/dL Final  . Albumin 01/12/2019 3.7  3.5 - 5.2 g/dL Final  . Calcium 01/12/2019 9.3  8.4 - 10.5 mg/dL Final  . GFR 01/12/2019 68.92  >60.00 mL/min Final  . Cholesterol 01/12/2019 226* 0 - 200 mg/dL Final   ATP III Classification       Desirable:  < 200 mg/dL               Borderline High:  200 - 239 mg/dL          High:  > = 240 mg/dL  . Triglycerides 01/12/2019 101.0  0.0 - 149.0 mg/dL Final   Normal:  <150 mg/dLBorderline High:  150 - 199 mg/dL  . HDL 01/12/2019 51.70  >39.00 mg/dL Final  . VLDL 01/12/2019 20.2  0.0 - 40.0 mg/dL Final  . LDL Cholesterol 01/12/2019 154* 0 - 99 mg/dL Final  . Total CHOL/HDL Ratio 01/12/2019 4   Final                  Men          Women1/2 Average Risk     3.4          3.3Average Risk          5.0          4.42X Average Risk  9.6          7.13X Average Risk          15.0          11.0                      . NonHDL 01/12/2019 174.38   Final   NOTE:  Non-HDL goal should be 30 mg/dL higher than patient's LDL goal (i.e. LDL goal of < 70 mg/dL, would have non-HDL goal of < 100 mg/dL)  . Hgb A1c MFr Bld 01/12/2019 8.2*  4.6 - 6.5 % Final   Glycemic Control Guidelines for People with Diabetes:Non Diabetic:  <6%Goal of Therapy: <7%Additional Action Suggested:  >8%       Allergies as of 01/17/2019      Reactions   Actos [pioglitazone] Swelling   Bee Venom Swelling   Codeine Hives, Nausea Only   Statins Other (See Comments)   Muscle pain, fatigue   Glucophage [metformin Hcl] Nausea Only   Raspberry Itching      Medication List       Accurate as of January 17, 2019 10:14 AM. If you have any questions, ask your nurse or doctor.        apixaban 5 MG Tabs tablet Commonly known as: Eliquis Take 1 tablet (5 mg total) by mouth 2 (two) times daily.   BD Veo Insulin Syringe U/F 31G X 15/64" 0.5 ML Misc Generic drug: Insulin Syringe-Needle U-100 USE TO INJECT INSULIN 4 TIMES A DAY   bimatoprost 0.01 % Soln Commonly known as: LUMIGAN Place 1 drop into the left eye at bedtime.   furosemide 40 MG tablet Commonly known as: LASIX Take 1 tablet (40 mg total) by mouth daily as needed.   glucose blood test strip Commonly known as: OneTouch Verio 1 each by Other route as needed for other. Use as instructed   insulin NPH Human 100 UNIT/ML injection Commonly known as: NovoLIN N INJECT 22 UNITS IN THE SKIN DAILY   lisinopril 5 MG tablet Commonly known as: ZESTRIL TAKE 1 TABLET BY MOUTH DAILY. PT MUST KEEP UPCOMING APPT IN ORDER TO RECEIVE MORE REFILLS   metoprolol tartrate 50 MG tablet Commonly known as: LOPRESSOR Take 0.5 tablets (25 mg total) by mouth 2 (two) times daily.   NovoLOG 100 UNIT/ML injection Generic drug: insulin aspart INJECT 12-15 UNITS IN THE SKIN 3 TIMES DAILY What changed: See the new instructions.       Allergies:  Allergies  Allergen Reactions  . Actos [Pioglitazone] Swelling  . Bee Venom Swelling  . Codeine Hives and Nausea Only  . Statins Other (See Comments)    Muscle pain, fatigue  . Glucophage [Metformin Hcl] Nausea Only  . Raspberry Itching    Past  Medical History:  Diagnosis Date  . Anemia   . Arthritis    "back, left ankle" (11/20/2015)  . CHF (congestive heart failure) ([place])   . Chronic lower back pain   . Coronary artery disease    a. 05/2009 CABG x 3: LIMA->LAD, VG->OM, VG->PDA; b. Nuc 01/2014: inf-lat scar but no ischemia, EF 57%.  . Diabetic retinopathy (HCC)    mild- Dr. Herbert Deaner  . Glaucoma   . Hyperlipidemia    a. Intolerant of lipitor and vytorin.  . Migraine    "once or twice" (11/20/2015)  . Myocardial infarction [facility])    'saw evidence of it on an EKG done in 2005"  . Paroxysmal atrial flutter ([place])  a. 01/2014 s/p DCCV;  b. CHA2DS2VASc = 3-->Eliquis.  . Pneumonia 1960  . PTSD (post-traumatic stress disorder)    "X 2 yr post OHS" (10/31/2014)  . Sacral pain    "right"  . Sciatica of right side   . Testicular cancer ([place]) 1982   "heavy doses of radiation; it was stage 2"  . Type II diabetes mellitus ([place])   . Valvular heart disease    a. 01/2014 Echo: Ef 60-65%, no rwma, mild AI/MS, mod MR, mildly dil LA.    Past Surgical History:  Procedure Laterality Date  . ABDOMINAL EXPLORATION SURGERY  ~ 2005   "for hernia, but didn't have one"  . ATRIAL FIBRILLATION ABLATION N/A 09/17/2016   Procedure: Atrial Fibrillation Ablation;  Surgeon: [name], MD;  Location: [place] [facility];  Service: Cardiovascular;  Laterality: N/A;  . CARDIAC CATHETERIZATION  2011  . CARDIOVERSION N/A 02/19/2014   Procedure: CARDIOVERSION;  Surgeon: [name], MD;  [facility] ENDOSCOPY;  Service: Cardiovascular;  Laterality: N/A;  . CARDIOVERSION N/A 10/28/2015   Procedure: CARDIOVERSION;  Surgeon: [name], MD;  Location: [facility] ENDOSCOPY;  Service: Cardiovascular;  Laterality: N/A;  . CARDIOVERSION  11/20/2015   "200 joules"  . CORONARY ARTERY BYPASS GRAFT  05/2009   LIMA-LAD, SVG-OM, SVG-PDA 06/20/09  . ELBOW FRACTURE SURGERY Left    broken ulna on left elbow-surgical repair  . ELECTROPHYSIOLOGIC STUDY N/A  10/31/2014   Procedure: A-Flutter;  Surgeon: [name], MD;  Location: [place] [facility] LAB;  Service: Cardiovascular;  Laterality: N/A;  . ELECTROPHYSIOLOGIC STUDY N/A 11/20/2015   Procedure: A-Flutter Ablation;  Surgeon: [name], MD;  Location: [place] [facility] LAB;  Service: Cardiovascular;  Laterality: N/A;  . FRACTURE SURGERY    . TESTICLE REMOVAL Right 1982  . TONSILLECTOMY  ~ 30    Family History  Problem Relation Age of Onset  . Diabetes Mother   . Heart disease Father   . Heart attack Father   . Diabetes Father   . Hypertension Father   . Diabetes Sister     Social History:  reports that he quit smoking about 30 years ago. His smoking use included cigarettes. He smoked 0.00 packs per day for 15.00 years. He has never used smokeless tobacco. He reports current alcohol use of about 3.0 standard drinks of alcohol per week. He reports that he does not use drugs.    Review of Systems         Lipids: He was previously on Vytorin and Lipitor. He thinks he had myalgias and weakness when taking these drugs.  He has been recommended statin drugs in the lipid clinic but he refuses to take them stating that they will not help him.   Also has significantly increased LDL particle number  He will also not take non-statin medications at all includig Zetia despite explaining how this works   Lab Results  Component Value Date   CHOL 226 (H) 01/12/2019   HDL 51.70 01/12/2019   LDLCALC 154 (H) 01/12/2019   TRIG 101.0 01/12/2019   CHOLHDL 4 01/12/2019    He has been  treated by cardiologist for his atrial fibrillation    Physical Examination:  There were no vitals taken for this visit.    ASSESSMENT:  Diabetes type 2, insulin-dependent and uncontrolled   See history of present illness for detailed discussion of current diabetes management, blood sugar patterns and problems identified  His A1c is 8.2  As discussed above his level of control is suboptimal Without  adequate blood sugar monitoring difficult to adjust his insulin regimen He is also getting excessive hypoglycemia overnight and since this is mostly after 1 AM likely to be from inconsistent and long action of NPH which he takes in the morning He is taking relatively large doses of mealtime insulin but postprandial readings are not available He would benefit from [place] but he could not tolerate this because of polyuria  LIPIDS: Still untreated and he has refused to take statin drugs despite history of coronary disease  PLAN:    Emphasized the importance of checking blood sugars consistently at various times to help adjust insulin  He will at least try to check sugars several times a day before his follow-up visit  Discussed that if he needs to avoid low sugars overnight he will need to cut back on his NPH for now to at least 18 units and further if needed  However if his fasting blood sugars are consistently high may need to consider splitting the insulin to twice a day with a smaller dose at bedtime  Also he will check sugars about 2 hours after his meals especially lunch and dinner to help adjust the NovoLog  Blood sugar should not be under 120 or over 180 after meals  Likely needs to check sugars before and after exercise also to see the difference  If he is able to check blood sugars frequently enough he may qualify for freestyle libre CGM  Also consider evaluation for meal planning  Alternatively may also benefit from GLP-1 drugs but he is usually concerned about cost of brand-name medications  He will follow-up with his PCP regarding his concerns for anemia  Also needs regular follow-up here  There are no Patient Instructions on file for this visit.     Duration of telephone encounter =12-1/2 minutes     Elayne Snare 01/17/2019, 10:14 AM   Note: This office note was prepared with Dragon voice recognition system technology. Any transcriptional errors that result from  this process are unintentional.

## 2019-01-23 DIAGNOSIS — R69 Illness, unspecified: Secondary | ICD-10-CM | POA: Diagnosis not present

## 2019-01-26 ENCOUNTER — Ambulatory Visit: Payer: Self-pay | Admitting: Family Medicine

## 2019-02-08 ENCOUNTER — Other Ambulatory Visit: Payer: Self-pay

## 2019-02-08 ENCOUNTER — Ambulatory Visit (INDEPENDENT_AMBULATORY_CARE_PROVIDER_SITE_OTHER): Payer: Medicare HMO | Admitting: Family Medicine

## 2019-02-08 ENCOUNTER — Other Ambulatory Visit: Payer: Self-pay | Admitting: Endocrinology

## 2019-02-08 ENCOUNTER — Encounter: Payer: Self-pay | Admitting: Family Medicine

## 2019-02-08 VITALS — BP 140/73 | HR 76 | Temp 97.7°F | Wt 202.6 lb

## 2019-02-08 DIAGNOSIS — M25611 Stiffness of right shoulder, not elsewhere classified: Secondary | ICD-10-CM

## 2019-02-08 DIAGNOSIS — D649 Anemia, unspecified: Secondary | ICD-10-CM | POA: Diagnosis not present

## 2019-02-08 DIAGNOSIS — Z23 Encounter for immunization: Secondary | ICD-10-CM

## 2019-02-08 DIAGNOSIS — E1165 Type 2 diabetes mellitus with hyperglycemia: Secondary | ICD-10-CM

## 2019-02-08 DIAGNOSIS — Z794 Long term (current) use of insulin: Secondary | ICD-10-CM | POA: Diagnosis not present

## 2019-02-08 NOTE — Patient Instructions (Addendum)
For anemia - most recent reading stable. Follow up with gastroenterology for updated colonoscopy, then we can discuss anemia further and repeat testing next few months. Discuss fatigue with endocrinology as variable diabetes control can cause fatigue.   Right shoulder issue appears to be possible frozen shoulder or adhesive capsulitis.  See information below.  I will refer you to orthopedic shoulder specialist to evaluate that area further.  Return to the clinic or go to the nearest emergency room if any of your symptoms worsen or new symptoms occur.  Thanks for coming in to see me today.  I will follow up with you in a few months and we can review your health history further at that time.  Please let me know if there are questions sooner.    Adhesive Capsulitis  Adhesive capsulitis, also called frozen shoulder, causes the shoulder to become stiff and painful to move. This condition happens when there is inflammation of the tendons and ligaments that surround the shoulder joint (shoulder capsule). What are the causes? This condition may be caused by:  An injury to your shoulder joint.  Straining your shoulder.  Not moving your shoulder for a period of time. This can happen if your arm was injured or in a sling.  Long-standing conditions, such as: ? Diabetes. ? Thyroid problems. ? Heart disease. ? Stroke. ? Rheumatoid arthritis. ? Lung disease. In some cases, the cause is not known. What increases the risk? You are more likely to develop this condition if you are:  A woman.  Older than 71 years of age. What are the signs or symptoms? Symptoms of this condition include:  Pain in your shoulder when you move your arm. There may also be pain when parts of your shoulder are touched. The pain may be worse at night or when you are resting.  A sore or aching shoulder.  The inability to move your shoulder normally.  Muscle spasms. How is this diagnosed? This condition is  diagnosed with a physical exam and imaging tests, such as an X-ray or MRI. How is this treated? This condition may be treated with:  Treatment of the underlying cause or condition.  Medicine. Medicine may be given to relieve pain, inflammation, or muscle spasms.  Steroid injections into the shoulder joint.  Physical therapy. This involves performing exercises to get the shoulder moving again.  Acupuncture. This is a type of treatment that involves stimulating specific points on your body by inserting thin needles through your skin.  Shoulder manipulation. This is a procedure to move the shoulder into another position. It is done after you are given a medicine to make you fall asleep (general anesthetic). The joint may also be injected with salt water at high pressure to break down scarring.  Surgery. This may be done in severe cases when other treatments have failed. Although most people recover completely from adhesive capsulitis, some may not regain full shoulder movement. Follow these instructions at home: Managing pain, stiffness, and swelling      If directed, put ice on the injured area: ? Put ice in a plastic bag. ? Place a towel between your skin and the bag. ? Leave the ice on for 20 minutes, 2-3 times per day.  If directed, apply heat to the affected area before you exercise. Use the heat source that your health care provider recommends, such as a moist heat pack or a heating pad. ? Place a towel between your skin and the heat source. ?  Leave the heat on for 20-30 minutes. ? Remove the heat if your skin turns bright red. This is especially important if you are unable to feel pain, heat, or cold. You may have a greater risk of getting burned. General instructions  Take over-the-counter and prescription medicines only as told by your health care provider.  If you are being treated with physical therapy, follow instructions from your physical therapist.  Avoid exercises  that put a lot of demand on your shoulder, such as throwing. These exercises can make pain worse.  Keep all follow-up visits as told by your health care provider. This is important. Contact a health care provider if:  You develop new symptoms.  Your symptoms get worse. Summary  Adhesive capsulitis, also called frozen shoulder, causes the shoulder to become stiff and painful to move.  You are more likely to have this condition if you are a woman and over age 51.  It is treated with physical therapy, medicines, and sometimes surgery. This information is not intended to replace advice given to you by your health care provider. Make sure you discuss any questions you have with your health care provider. Document Released: 02/08/2009 Document Revised: 09/17/2017 Document Reviewed: 09/17/2017 Elsevier Patient Education  [employer].    If you have lab work done today you will be contacted with your lab results within the next 2 weeks.  If you have not heard from [place] then please contact us. The fastest way to get your results is to register for My Chart.   IF you received an x-ray today, you will receive an invoice from [facility]. Please contact [facility] at [phone] with questions or concerns regarding your invoice.   IF you received labwork today, you will receive an invoice from [place]. Please contact [facility] at [phone] with questions or concerns regarding your invoice.   Our billing staff will not be able to assist you with questions regarding bills from these companies.  You will be contacted with the lab results as soon as they are available. The fastest way to get your results is to activate your My Chart account. Instructions are located on the last page of this paperwork. If you have not heard from [place] regarding the results in 2 weeks, please contact this office.

## 2019-02-08 NOTE — Progress Notes (Signed)
Subjective:    Patient ID: Matthew [name], male    DOB: November 09, 1945, 73 y.o.   MRN: [id number]  HPI Matthew [name] is a 73 y.o. male Presents today for: Chief Complaint  Patient presents with  . Establish Care    Would like to talk to Dr Carlota Raspberry about my hx of anemia. Also would like DR Carlota Raspberry to take over management of  my Diabetes   . Shoulder Pain    right shoulder injury that is unknown in the last 2year and would like DR Carlota Raspberry to take a look at it.    Presents to establish care with multiple concerns as above.  Previous patient of Dr. Criss Rosales.  History of CAD with prior MI, type 2 diabetes on insulin, atrial fibrillation, valvular heart disease.   Diabetes Diabetes followed by Dr. Dwyane Dee with endocrinology, last visit September 22.  A1c 8.2.  History of overnight hypoglycemia, meds were adjusted. Asking about me taking over control of diabetes - plans to continue with him for now as not controlled and some recent changes.   Cardiac History of atypical atrial flutter followed by Dr. Curt Bears, appointment [facility] 17.  CAD with CABG in 2011.  Prior a flutter ablation with recurrent atrial flutter, then ablation in May 2018 for atrial fibrillation and left atrial flutter.  Sinus rhythm at [place] appointment with Dr. Curt Bears, continued on Eliquis for anticoagulation.  Metoprolol 25 mg twice daily for rate control.  Lasix 40 mg daily.  Anemia Lab Results  Component Value Date   WBC 7.9 12/12/2018   HGB 12.6 (L) 12/12/2018   HCT 38.7 12/12/2018   MCV 90 12/12/2018   PLT 271 12/12/2018  Range of 12.2-12.5 over the past few years. Colonoscopy: about 5 years ago - [facility], possible polyps? Received a letter for recent rescheduling colonoscopy.  Brittle nails, fatigue at times - no new symptoms - longstanding.   R shoulder pain: Prior racquetball player. Had to stop playing racquetball few years ago.  Decreased mobility on left shoulder within past 2 years. No pain and no preceding  pain.  R hand dominant.  Weight exercise at home, no formal PT or prior eval.  Ortho: Dr Gladstone Lighter at [facility].   Patient Active Problem List   Diagnosis Date Noted  . Atrial flutter ([place]) 10/31/2014  . Coronary artery disease   . Paroxysmal atrial flutter ([place])   . Diabetes mellitus without complication ([place])   . Valvular heart disease   . Atrial fibrillation, unspecified 01/25/2014  . Diabetes mellitus, type 2 ([place]) 01/16/2014  . Hyperlipidemia 01/16/2014  . Erectile dysfunction associated with type 2 diabetes mellitus ([place]) 01/16/2014  . Coronary atherosclerosis of native coronary artery 06/30/2013  . Old myocardial infarction 06/30/2013  . Pure hypercholesterolemia 06/30/2013   Past Medical History:  Diagnosis Date  . Anemia   . Arthritis    "back, left ankle" (11/20/2015)  . Cataract   . CHF (congestive heart failure) ([place])   . Chronic lower back pain   . Coronary artery disease    a. 05/2009 CABG x 3: LIMA->LAD, VG->OM, VG->PDA; b. Nuc 01/2014: inf-lat scar but no ischemia, EF 57%.  . Diabetic retinopathy ([facility])    mild- Dr. Herbert Deaner  . Glaucoma   . Hyperlipidemia    a. Intolerant of lipitor and vytorin.  . Migraine    "once or twice" (11/20/2015)  . Myocardial infarction [facility])    'saw evidence of it on an EKG done in 2005"  .  Paroxysmal atrial flutter ([place])    a. 01/2014 s/p DCCV;  b. CHA2DS2VASc = 3-->Eliquis.  . Pneumonia 1960  . Sacral pain    "right"  . Sciatica of right side   . Testicular cancer ([place]) 1982   "heavy doses of radiation; it was stage 2"  . Type II diabetes mellitus ([place])   . Valvular heart disease    a. 01/2014 Echo: Ef 60-65%, no rwma, mild AI/MS, mod MR, mildly dil LA.   Past Surgical History:  Procedure Laterality Date  . ABDOMINAL EXPLORATION SURGERY  ~ 2005   "for hernia, but didn't have one"  . ATRIAL FIBRILLATION ABLATION N/A 09/17/2016   Procedure: Atrial Fibrillation Ablation;  Surgeon: [name], MD;  Location: [place] CV LAB;  Service: Cardiovascular;  Laterality: N/A;  . CARDIAC CATHETERIZATION  2011  . CARDIOVERSION N/A 02/19/2014   Procedure: CARDIOVERSION;  Surgeon: [name], MD;  Location: [facility] ENDOSCOPY;  Service: Cardiovascular;  Laterality: N/A;  . CARDIOVERSION N/A 10/28/2015   Procedure: CARDIOVERSION;  Surgeon: [name], MD;  Location: [facility] ENDOSCOPY;  Service: Cardiovascular;  Laterality: N/A;  . CARDIOVERSION  11/20/2015   "200 joules"  . CORONARY ARTERY BYPASS GRAFT  05/2009   LIMA-LAD, SVG-OM, SVG-PDA 06/20/09  . ELBOW FRACTURE SURGERY Left    broken ulna on left elbow-surgical repair  . ELECTROPHYSIOLOGIC STUDY N/A 10/31/2014   Procedure: A-Flutter;  Surgeon: [name], MD;  Location: [place] [facility] LAB;  Service: Cardiovascular;  Laterality: N/A;  . ELECTROPHYSIOLOGIC STUDY N/A 11/20/2015   Procedure: A-Flutter Ablation;  Surgeon: [name], MD;  Location: [place] CV LAB;  Service: Cardiovascular;  Laterality: N/A;  . EYE SURGERY    . FRACTURE SURGERY    . TESTICLE REMOVAL Right 1982  . TONSILLECTOMY  ~ 1967   Allergies  Allergen Reactions  . Actos [Pioglitazone] Swelling  . Bee Venom Swelling  . Codeine Hives and Nausea Only  . Statins Other (See Comments)    Muscle pain, fatigue  . Glucophage [Metformin Hcl] Nausea Only  . Raspberry Itching   Prior to Admission medications   Medication Sig Start Date End Date Taking? Authorizing Provider  apixaban (ELIQUIS) 5 MG TABS tablet Take 1 tablet (5 mg total) by mouth 2 (two) times daily. 09/05/18   Camnitz, Will Hassell Done, MD  bimatoprost (LUMIGAN) 0.01 % SOLN Place 1 drop into the left eye at bedtime.    [provider]  furosemide (LASIX) 40 MG tablet Take 1 tablet (40 mg total) by mouth daily as needed. 06/24/18 12/12/18  Camnitz, Ocie Doyne, MD  glucose blood [facility] VERIO) test strip 1 each by Other route as needed for other. Use as instructed 02/04/18   Elayne Snare, MD  insulin NPH Human  (NOVOLIN N) 100 UNIT/ML injection INJECT 22 UNITS IN THE SKIN DAILY 02/08/19   Elayne Snare, MD  Insulin Syringe-Needle U-100 (BD VEO INSULIN SYRINGE U/F) 31G X 15/64" 0.5 ML MISC USE TO INJECT INSULIN 4 TIMES A DAY 12/21/18   Elayne Snare, MD  lisinopril (ZESTRIL) 5 MG tablet TAKE 1 TABLET BY MOUTH DAILY. PT MUST KEEP UPCOMING APPT IN ORDER TO RECEIVE MORE REFILLS 11/10/18   Jettie Booze, MD  metoprolol tartrate (LOPRESSOR) 50 MG tablet Take 0.5 tablets (25 mg total) by mouth 2 (two) times daily. 09/05/18   [name], MD  NOVOLOG 100 UNIT/ML injection INJECT 12-15 UNITS IN THE SKIN 3 TIMES DAILY Patient taking differently: Inject 12-15  units in the skin 2 times daily 01/16/19   Elayne Snare, MD   Social History   Socioeconomic History  . Marital status: Married    Spouse name: Not on file  . Number of children: Not on file  . Years of education: Not on file  . Highest education level: Not on file  Occupational History  . Not on file  Social Needs  . Financial resource strain: Not on file  . Food insecurity    Worry: Not on file    Inability: Not on file  . Transportation needs    Medical: Not on file    Non-medical: Not on file  Tobacco Use  . Smoking status: Former Smoker    Packs/day: 0.00    Years: 15.00    Pack years: 0.00    Types: Cigarettes    Quit date: 1990    Years since quitting: 30.8  . Smokeless tobacco: Never Used  . Tobacco comment: "quit smoking cigarettes in the 1980's; don't know how much or for how long"  Substance and Sexual Activity  . Alcohol use: Yes    Alcohol/week: 3.0 standard drinks    Types: 3 Cans of beer per week  . Drug use: No    Comment: "quit smoking pot in the 1990s"  . Sexual activity: Not Currently    Birth control/protection: Coitus interruptus  Lifestyle  . Physical activity    Days per week: Not on file    Minutes per session: Not on file  . Stress: Not on file  Relationships  . Social Herbalist on phone: Not  on file    Gets together: Not on file    Attends religious service: Not on file    Active member of club or organization: Not on file    Attends meetings of clubs or organizations: Not on file    Relationship status: Not on file  . Intimate partner violence    Fear of current or ex partner: Not on file    Emotionally abused: Not on file    Physically abused: Not on file    Forced sexual activity: Not on file  Other Topics Concern  . Not on file  Social History Narrative  . Not on file    Review of Systems Per HPi    Objective:   Physical Exam Vitals signs reviewed.  Constitutional:      Appearance: He is well-developed.  HENT:     Head: Normocephalic and atraumatic.  Eyes:     Pupils: Pupils are equal, round, and reactive to light.  Neck:     Vascular: No carotid bruit or JVD.  Cardiovascular:     Rate and Rhythm: Normal rate and regular rhythm.     Heart sounds: Normal heart sounds. No murmur.  Pulmonary:     Effort: Pulmonary effort is normal.     Breath sounds: Normal breath sounds. No rales.  Musculoskeletal:     Right shoulder: He exhibits decreased range of motion (Slight discomfort with Neer's, negative Hawkins, last approximately 45 degrees of flexion, internal rotation to approximately L1 versus midthoracic on the left.  No appreciable changes with passive range of motion.). He exhibits no tenderness, no bony tenderness and normal strength (Rotator cuff strength grossly intact, negative drop arm, negative empty can.).  Skin:    General: Skin is warm and dry.  Neurological:     Mental Status: He is alert and oriented to person, place, and time.  Vitals:   02/08/19 1050  BP: 140/73  Pulse: 76  Temp: 97.7 F (36.5 C)  TempSrc: Oral  SpO2: 97%  Weight: 202 lb 9.6 oz (91.9 kg)       Assessment & Plan:   Matthew [name] is a 73 y.o. male Shoulder stiffness, right - Plan: Ambulatory referral to Orthopedic Surgery  -Exam and history concerning for  adhesive capsulitis.  Refer to orthopedics for further evaluation, possible injection or physical therapy.  Need for prophylactic vaccination against Streptococcus pneumoniae (pneumococcus) - Plan: Pneumococcal polysaccharide vaccine 23-valent greater than or equal to 2yo subcutaneous/IM given  Anemia, unspecified type  -Mild, and appears stable from previous readings.  Recommended follow-up with gastroenterology for repeat colonoscopy, and then follow-up for repeat testing next few months, further discussion.  -He did note some symptoms of fatigue but unlikely from mildly low hemoglobin.  Offered repeat CBC but notes the symptoms have been present for some time.  More likely related to variable diabetic control.  Type 2 diabetes mellitus with hyperglycemia, with long-term current use of insulin ([place]) - Plan: HM Diabetes Foot Exam  -Recommend he continue care with endocrinology given complications of hyperglycemia and variable control/lability.  Agreed with plan.   No orders of the defined types were placed in this encounter.  Patient Instructions     For anemia - most recent reading stable. Follow up with gastroenterology for updated colonoscopy, then we can discuss anemia further and repeat testing next few months. Discuss fatigue with endocrinology as variable diabetes control can cause fatigue.   Right shoulder issue appears to be possible frozen shoulder or adhesive capsulitis.  See information below.  I will refer you to orthopedic shoulder specialist to evaluate that area further.  Return to the clinic or go to the nearest emergency room if any of your symptoms worsen or new symptoms occur.  Thanks for coming in to see me today.  I will follow up with you in a few months and we can review your health history further at that time.  Please let me know if there are questions sooner.    Adhesive Capsulitis  Adhesive capsulitis, also called frozen shoulder, causes the shoulder to become  stiff and painful to move. This condition happens when there is inflammation of the tendons and ligaments that surround the shoulder joint (shoulder capsule). What are the causes? This condition may be caused by:  An injury to your shoulder joint.  Straining your shoulder.  Not moving your shoulder for a period of time. This can happen if your arm was injured or in a sling.  Long-standing conditions, such as: ? Diabetes. ? Thyroid problems. ? Heart disease. ? Stroke. ? Rheumatoid arthritis. ? Lung disease. In some cases, the cause is not known. What increases the risk? You are more likely to develop this condition if you are:  A woman.  Older than 71 years of age. What are the signs or symptoms? Symptoms of this condition include:  Pain in your shoulder when you move your arm. There may also be pain when parts of your shoulder are touched. The pain may be worse at night or when you are resting.  A sore or aching shoulder.  The inability to move your shoulder normally.  Muscle spasms. How is this diagnosed? This condition is diagnosed with a physical exam and imaging tests, such as an X-ray or MRI. How is this treated? This condition may be treated with:  Treatment of the underlying cause or  condition.  Medicine. Medicine may be given to relieve pain, inflammation, or muscle spasms.  Steroid injections into the shoulder joint.  Physical therapy. This involves performing exercises to get the shoulder moving again.  Acupuncture. This is a type of treatment that involves stimulating specific points on your body by inserting thin needles through your skin.  Shoulder manipulation. This is a procedure to move the shoulder into another position. It is done after you are given a medicine to make you fall asleep (general anesthetic). The joint may also be injected with salt water at high pressure to break down scarring.  Surgery. This may be done in severe cases when other  treatments have failed. Although most people recover completely from adhesive capsulitis, some may not regain full shoulder movement. Follow these instructions at home: Managing pain, stiffness, and swelling      If directed, put ice on the injured area: ? Put ice in a plastic bag. ? Place a towel between your skin and the bag. ? Leave the ice on for 20 minutes, 2-3 times per day.  If directed, apply heat to the affected area before you exercise. Use the heat source that your health care provider recommends, such as a moist heat pack or a heating pad. ? Place a towel between your skin and the heat source. ? Leave the heat on for 20-30 minutes. ? Remove the heat if your skin turns bright red. This is especially important if you are unable to feel pain, heat, or cold. You may have a greater risk of getting burned. General instructions  Take over-the-counter and prescription medicines only as told by your health care provider.  If you are being treated with physical therapy, follow instructions from your physical therapist.  Avoid exercises that put a lot of demand on your shoulder, such as throwing. These exercises can make pain worse.  Keep all follow-up visits as told by your health care provider. This is important. Contact a health care provider if:  You develop new symptoms.  Your symptoms get worse. Summary  Adhesive capsulitis, also called frozen shoulder, causes the shoulder to become stiff and painful to move.  You are more likely to have this condition if you are a woman and over age 72.  It is treated with physical therapy, medicines, and sometimes surgery. This information is not intended to replace advice given to you by your health care provider. Make sure you discuss any questions you have with your health care provider. Document Released: 02/08/2009 Document Revised: 09/17/2017 Document Reviewed: 09/17/2017 Elsevier Patient Education  [employer].    If  you have lab work done today you will be contacted with your lab results within the next 2 weeks.  If you have not heard from [place] then please contact us. The fastest way to get your results is to register for My Chart.   IF you received an x-ray today, you will receive an invoice from [facility]. Please contact [facility] at [phone] with questions or concerns regarding your invoice.   IF you received labwork today, you will receive an invoice from [place]. Please contact [facility] at [phone] with questions or concerns regarding your invoice.   Our billing staff will not be able to assist you with questions regarding bills from these companies.  You will be contacted with the lab results as soon as they are available. The fastest way to get your results is to activate your My Chart account. Instructions are located on  the last page of this paperwork. If you have not heard from [place] regarding the results in 2 weeks, please contact this office.       Signed,   Merri Ray, MD [facility] [place].  02/08/19 7:13 PM

## 2019-02-14 ENCOUNTER — Other Ambulatory Visit: Payer: Self-pay | Admitting: Interventional Cardiology

## 2019-02-24 ENCOUNTER — Other Ambulatory Visit: Payer: Self-pay | Admitting: Endocrinology

## 2019-03-27 DIAGNOSIS — H4032X1 Glaucoma secondary to eye trauma, left eye, mild stage: Secondary | ICD-10-CM | POA: Diagnosis not present

## 2019-03-27 DIAGNOSIS — E113393 Type 2 diabetes mellitus with moderate nonproliferative diabetic retinopathy without macular edema, bilateral: Secondary | ICD-10-CM | POA: Diagnosis not present

## 2019-03-27 DIAGNOSIS — Z961 Presence of intraocular lens: Secondary | ICD-10-CM | POA: Diagnosis not present

## 2019-03-27 DIAGNOSIS — H40021 Open angle with borderline findings, high risk, right eye: Secondary | ICD-10-CM | POA: Diagnosis not present

## 2019-03-28 ENCOUNTER — Other Ambulatory Visit: Payer: Medicare HMO

## 2019-03-31 ENCOUNTER — Ambulatory Visit: Payer: Medicare HMO | Admitting: Endocrinology

## 2019-04-05 ENCOUNTER — Other Ambulatory Visit: Payer: Self-pay | Admitting: Cardiology

## 2019-04-05 ENCOUNTER — Other Ambulatory Visit: Payer: Self-pay | Admitting: Internal Medicine

## 2019-04-05 DIAGNOSIS — I483 Typical atrial flutter: Secondary | ICD-10-CM

## 2019-04-05 MED ORDER — METOPROLOL TARTRATE 50 MG PO TABS: 25.0000 mg | ORAL_TABLET | Freq: Two times a day (BID) | ORAL | 2 refills | Status: DC

## 2019-04-05 NOTE — Telephone Encounter (Signed)
Pt's medication were sent to pt's pharmacy as requested. Confirmation received.  

## 2019-04-10 ENCOUNTER — Ambulatory Visit: Payer: Medicare HMO | Admitting: Family Medicine

## 2019-04-16 ENCOUNTER — Other Ambulatory Visit: Payer: Self-pay | Admitting: Cardiology

## 2019-04-17 NOTE — Telephone Encounter (Signed)
Pt last saw Dr Curt Bears 12/12/18, last labs 01/12/19 Creat 1.06, age 73, weight 91.9kg, based on specified criteria pt is on appropriate dosage of Eliquis 5mg  BID.  Will refill rx.

## 2019-04-25 DIAGNOSIS — E113393 Type 2 diabetes mellitus with moderate nonproliferative diabetic retinopathy without macular edema, bilateral: Secondary | ICD-10-CM | POA: Diagnosis not present

## 2019-04-25 DIAGNOSIS — Z961 Presence of intraocular lens: Secondary | ICD-10-CM | POA: Diagnosis not present

## 2019-05-03 ENCOUNTER — Other Ambulatory Visit: Payer: Self-pay | Admitting: Endocrinology

## 2019-05-10 ENCOUNTER — Telehealth: Payer: Self-pay | Admitting: *Deleted

## 2019-05-10 NOTE — Telephone Encounter (Signed)
Schedule AwV

## 2019-05-10 NOTE — Telephone Encounter (Signed)
Patient declined AWV 

## 2019-05-16 ENCOUNTER — Ambulatory Visit: Payer: Medicare Other | Attending: Internal Medicine

## 2019-05-16 DIAGNOSIS — Z23 Encounter for immunization: Secondary | ICD-10-CM | POA: Insufficient documentation

## 2019-05-16 NOTE — Progress Notes (Signed)
   Covid-19 Vaccination Clinic  Name:  Matthew [name]    MRN: [id number] DOB: May 01, 1945  05/16/2019  [name] was observed post Covid-19 immunization for 15 minutes without incidence. He was provided with Vaccine Information Sheet and instruction to access the V-Safe system.   [name] was instructed to call 911 with any severe reactions post vaccine: Marland Kitchen Difficulty breathing  . Swelling of your face and throat  . A fast heartbeat  . A bad rash all over your body  . Dizziness and weakness    Immunizations Administered    Name Date Dose VIS Date Route   Pfizer COVID-19 Vaccine 05/16/2019  2:19 PM 0.3 mL 04/07/2019 Intramuscular   Manufacturer: [employer], [employer]   Lot: [id number]   [place]: [id number]

## 2019-06-05 ENCOUNTER — Ambulatory Visit: Payer: Medicare HMO | Attending: Internal Medicine

## 2019-06-05 DIAGNOSIS — Z23 Encounter for immunization: Secondary | ICD-10-CM | POA: Insufficient documentation

## 2019-06-05 NOTE — Progress Notes (Signed)
   Covid-19 Vaccination Clinic  Name:  Matthew [name]    MRN: [id number] DOB: 07-Aug-1945  06/05/2019  Matthew [name] was observed post Covid-19 immunization for 15 minutes without incidence. He was provided with Vaccine Information Sheet and instruction to access the V-Safe system.   Matthew [name] was instructed to call 911 with any severe reactions post vaccine: Marland Kitchen Difficulty breathing  . Swelling of your face and throat  . A fast heartbeat  . A bad rash all over your body  . Dizziness and weakness    Immunizations Administered    Name Date Dose VIS Date Route   Pfizer COVID-19 Vaccine 06/05/2019  3:12 PM 0.3 mL 04/07/2019 Intramuscular   Manufacturer: [place]   Lot: [id number]   [place]: [id number]

## 2019-06-13 ENCOUNTER — Other Ambulatory Visit: Payer: Self-pay | Admitting: Endocrinology

## 2019-06-17 ENCOUNTER — Other Ambulatory Visit: Payer: Self-pay | Admitting: Endocrinology

## 2019-07-23 ENCOUNTER — Other Ambulatory Visit: Payer: Self-pay | Admitting: Endocrinology

## 2019-08-16 ENCOUNTER — Other Ambulatory Visit: Payer: Self-pay | Admitting: Endocrinology

## 2019-09-15 ENCOUNTER — Other Ambulatory Visit: Payer: Self-pay | Admitting: Family Medicine

## 2019-09-15 DIAGNOSIS — G720 Drug-induced myopathy: Secondary | ICD-10-CM

## 2019-09-15 DIAGNOSIS — T466X5A Adverse effect of antihyperlipidemic and antiarteriosclerotic drugs, initial encounter: Secondary | ICD-10-CM

## 2019-09-27 DIAGNOSIS — E113392 Type 2 diabetes mellitus with moderate nonproliferative diabetic retinopathy without macular edema, left eye: Secondary | ICD-10-CM | POA: Diagnosis not present

## 2019-09-27 DIAGNOSIS — H4032X1 Glaucoma secondary to eye trauma, left eye, mild stage: Secondary | ICD-10-CM | POA: Diagnosis not present

## 2019-09-27 DIAGNOSIS — E113311 Type 2 diabetes mellitus with moderate nonproliferative diabetic retinopathy with macular edema, right eye: Secondary | ICD-10-CM | POA: Diagnosis not present

## 2019-09-27 DIAGNOSIS — H40021 Open angle with borderline findings, high risk, right eye: Secondary | ICD-10-CM | POA: Diagnosis not present

## 2019-09-27 LAB — HM DIABETES EYE EXAM

## 2019-10-11 ENCOUNTER — Other Ambulatory Visit: Payer: Self-pay

## 2019-10-11 ENCOUNTER — Other Ambulatory Visit (INDEPENDENT_AMBULATORY_CARE_PROVIDER_SITE_OTHER): Payer: Medicare HMO

## 2019-10-11 DIAGNOSIS — Z794 Long term (current) use of insulin: Secondary | ICD-10-CM | POA: Diagnosis not present

## 2019-10-11 DIAGNOSIS — E1165 Type 2 diabetes mellitus with hyperglycemia: Secondary | ICD-10-CM | POA: Diagnosis not present

## 2019-10-11 LAB — BASIC METABOLIC PANEL
BUN: 28 mg/dL — ABNORMAL HIGH (ref 6–23)
CO2: 29 mEq/L (ref 19–32)
Calcium: 9.3 mg/dL (ref 8.4–10.5)
Chloride: 99 mEq/L (ref 96–112)
Creatinine, Ser: 1.16 mg/dL (ref 0.40–1.50)
GFR: 61.98 mL/min (ref >60.00)
Glucose, Bld: 293 mg/dL — ABNORMAL HIGH (ref 70–99)
Potassium: 4.8 mEq/L (ref 3.5–5.1)
Sodium: 133 mEq/L — ABNORMAL LOW (ref 135–145)

## 2019-10-11 LAB — MICROALBUMIN / CREATININE URINE RATIO
Creatinine,U: 147.3 mg/dL
Microalb Creat Ratio: 5.1 mg/g (ref 0.0–30.0)
Microalb, Ur: 7.5 mg/dL — ABNORMAL HIGH (ref 0.0–1.9)

## 2019-10-12 LAB — FRUCTOSAMINE: Fructosamine: 337 umol/L — ABNORMAL HIGH (ref 0–285)

## 2019-10-15 ENCOUNTER — Encounter (HOSPITAL_COMMUNITY): Payer: Self-pay

## 2019-10-15 ENCOUNTER — Other Ambulatory Visit: Payer: Self-pay

## 2019-10-15 ENCOUNTER — Ambulatory Visit (HOSPITAL_COMMUNITY)
Admission: EM | Admit: 2019-10-15 | Discharge: 2019-10-15 | Disposition: A | Discharge status: Home / Self Care | Payer: Medicare HMO | Attending: Emergency Medicine | Admitting: Emergency Medicine

## 2019-10-15 DIAGNOSIS — Z8547 Personal history of malignant neoplasm of testis: Secondary | ICD-10-CM | POA: Insufficient documentation

## 2019-10-15 DIAGNOSIS — I2510 Atherosclerotic heart disease of native coronary artery without angina pectoris: Secondary | ICD-10-CM | POA: Insufficient documentation

## 2019-10-15 DIAGNOSIS — Z79899 Other long term (current) drug therapy: Secondary | ICD-10-CM | POA: Diagnosis not present

## 2019-10-15 DIAGNOSIS — Z87891 Personal history of nicotine dependence: Secondary | ICD-10-CM | POA: Insufficient documentation

## 2019-10-15 DIAGNOSIS — Z951 Presence of aortocoronary bypass graft: Secondary | ICD-10-CM | POA: Insufficient documentation

## 2019-10-15 DIAGNOSIS — Z8249 Family history of ischemic heart disease and other diseases of the circulatory system: Secondary | ICD-10-CM | POA: Diagnosis not present

## 2019-10-15 DIAGNOSIS — Z20822 Contact with and (suspected) exposure to covid-19: Secondary | ICD-10-CM | POA: Diagnosis not present

## 2019-10-15 DIAGNOSIS — I252 Old myocardial infarction: Secondary | ICD-10-CM | POA: Insufficient documentation

## 2019-10-15 DIAGNOSIS — Z923 Personal history of irradiation: Secondary | ICD-10-CM | POA: Insufficient documentation

## 2019-10-15 DIAGNOSIS — Z833 Family history of diabetes mellitus: Secondary | ICD-10-CM | POA: Insufficient documentation

## 2019-10-15 DIAGNOSIS — Z794 Long term (current) use of insulin: Secondary | ICD-10-CM | POA: Diagnosis not present

## 2019-10-15 DIAGNOSIS — Z8572 Personal history of non-Hodgkin lymphomas: Secondary | ICD-10-CM | POA: Diagnosis not present

## 2019-10-15 DIAGNOSIS — Z885 Allergy status to narcotic agent status: Secondary | ICD-10-CM | POA: Insufficient documentation

## 2019-10-15 DIAGNOSIS — I509 Heart failure, unspecified: Secondary | ICD-10-CM | POA: Diagnosis not present

## 2019-10-15 DIAGNOSIS — Z888 Allergy status to other drugs, medicaments and biological substances status: Secondary | ICD-10-CM | POA: Insufficient documentation

## 2019-10-15 DIAGNOSIS — R509 Fever, unspecified: Secondary | ICD-10-CM | POA: Insufficient documentation

## 2019-10-15 DIAGNOSIS — E11319 Type 2 diabetes mellitus with unspecified diabetic retinopathy without macular edema: Secondary | ICD-10-CM | POA: Insufficient documentation

## 2019-10-15 DIAGNOSIS — R519 Headache, unspecified: Secondary | ICD-10-CM

## 2019-10-15 DIAGNOSIS — E785 Hyperlipidemia, unspecified: Secondary | ICD-10-CM | POA: Diagnosis not present

## 2019-10-15 DIAGNOSIS — Z7901 Long term (current) use of anticoagulants: Secondary | ICD-10-CM | POA: Insufficient documentation

## 2019-10-15 HISTORY — DX: Lyme disease, unspecified: A69.20

## 2019-10-15 MED ORDER — DOXYCYCLINE HYCLATE 100 MG PO CAPS
100.0000 mg | ORAL_CAPSULE | Freq: Two times a day (BID) | ORAL | 0 refills | Status: AC
Start: 2019-10-15 — End: 2019-11-05

## 2019-10-15 NOTE — Discharge Instructions (Signed)
Covid test pending, monitor my chart for results Blood work pending to check for [facility] spotted fever, Lyme disease-I will call only if abnormal Begin doxycycline twice daily to go ahead and cover for tickborne illness-if positive you will need to take for a full 21 days Tylenol every 4-6 hours as needed for fevers, body aches, headache Rest and drink plenty of fluids  Please follow-up if any symptoms not improving or worsening

## 2019-10-15 NOTE — ED Triage Notes (Signed)
Pt c/o fever, HA, chills, body aches over a wk now. Pt had a tic bite 12 days ago.

## 2019-10-15 NOTE — ED Provider Notes (Signed)
[facility] [place]    CSN: [id number] Arrival date & time: 10/15/19  1419      History   Chief Complaint Chief Complaint  Patient presents with  . fever, chills, HA    HPI Matthew [name] is a 74 y.o. male history of CHF, CAD, presenting today for evaluation of fever.  Patient reports over the past 3 days he has had fever, headaches, chills and body aches.  Symptoms began Friday and persisted over the weekend.  He does report that approximately 12 days previously he had a tick bite which he removed.  He is unsure of how long tick was present.  He denies any rashes that have developed since.  Reports area around the bite did turn into a whelp, but has been improving since.  Denies associated URI symptoms of cough congestion or sore throat.  Does report some mild shortness of breath.  He does feel his glucose readings have been lower than normal and reading approximately 60-70 after eating rather than 1 30-140.  Denies nausea vomiting diarrhea or abdominal pain.  Is fully Covid vaccinated.  Denies any close sick contacts.  Prior history of lymphoma status post radiation.  HPI  Past Medical History:  Diagnosis Date  . Anemia   . Arthritis    "back, left ankle" (11/20/2015)  . Cataract   . CHF (congestive heart failure) ([place])   . Chronic lower back pain   . Coronary artery disease    a. 05/2009 CABG x 3: LIMA->LAD, VG->OM, VG->PDA; b. Nuc 01/2014: inf-lat scar but no ischemia, EF 57%.  . Diabetic retinopathy (HCC)    mild- Dr. Herbert Deaner  . Glaucoma   . Hyperlipidemia    a. Intolerant of lipitor and vytorin.  . Lyme disease   . Migraine    "once or twice" (11/20/2015)  . Myocardial infarction [facility])    'saw evidence of it on an EKG done in 2005"  . Paroxysmal atrial flutter ([place])    a. 01/2014 s/p DCCV;  b. CHA2DS2VASc = 3-->Eliquis.  . Pneumonia 1960  . Sacral pain    "right"  . Sciatica of right side   . 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 Type II diabetes mellitus ([place])   . Valvular heart disease    a. 01/2014 Echo: Ef 60-65%, no rwma, mild AI/MS, mod MR, mildly dil LA.    Patient Active Problem List   Diagnosis Date Noted  . Statin myopathy 09/15/2019  . Atrial flutter ([place]) 10/31/2014  . Coronary artery disease   . Paroxysmal atrial flutter ([place])   . Diabetes mellitus without complication ([place])   . Valvular heart disease   . Atrial fibrillation, unspecified 01/25/2014  . Diabetes mellitus, type 2 ([place]) 01/16/2014  . Hyperlipidemia 01/16/2014  . Erectile dysfunction associated with type 2 diabetes mellitus ([place]) 01/16/2014  . Coronary atherosclerosis of native coronary artery 06/30/2013  . Old myocardial infarction 06/30/2013  . Pure hypercholesterolemia 06/30/2013    Past Surgical History:  Procedure Laterality Date  . ABDOMINAL EXPLORATION SURGERY  ~ 2005   "for hernia, but didn't have one"  . ATRIAL FIBRILLATION ABLATION N/A 09/17/2016   Procedure: Atrial Fibrillation Ablation;  Surgeon: [name], MD;  Location: [place] CV LAB;  Service: Cardiovascular;  Laterality: N/A;  . CARDIAC CATHETERIZATION  2011  . CARDIOVERSION N/A 02/19/2014   Procedure: CARDIOVERSION;  Surgeon: [name], MD;  Location: [facility] ENDOSCOPY;  Service: Cardiovascular;  Laterality: N/A;  . CARDIOVERSION N/A 10/28/2015   Procedure: CARDIOVERSION;  Surgeon: [name], MD;  Location[facility] ENDOSCOPY;  Service: Cardiovascular;  Laterality: N/A;  . CARDIOVERSION  11/20/2015   "200 joules"  . CORONARY ARTERY BYPASS GRAFT  05/2009   LIMA-LAD, SVG-OM, SVG-PDA 06/20/09  . ELBOW FRACTURE SURGERY Left    broken ulna on left elbow-surgical repair  . ELECTROPHYSIOLOGIC STUDY N/A 10/31/2014   Procedure: A-Flutter;  Surgeon: [name], MD;  Location: [place] CV LAB;  Service: Cardiovascular;  Laterality: N/A;  . ELECTROPHYSIOLOGIC STUDY N/A 11/20/2015   Procedure: A-Flutter Ablation;  Surgeon: [name],  MD;  Location: [place] CV LAB;  Service: Cardiovascular;  Laterality: N/A;  . EYE SURGERY    . FRACTURE SURGERY    . TESTICLE REMOVAL Right 1982  . TONSILLECTOMY  ~ [place] Medications    Prior to Admission medications   Medication Sig Start Date End Date Taking? Authorizing Provider  BD VEO INSULIN SYRINGE U/F 31G X 15/64" 0.5 ML MISC USE TO INJECT INSULIN 3 TIMES A DAY. REFILLABLE ONLY WITH FOLLOW UP 08/16/19   Elayne Snare, MD  bimatoprost (LUMIGAN) 0.01 % SOLN Place 1 drop into the left eye at bedtime.    [provider]  doxycycline (VIBRAMYCIN) 100 MG capsule Take 1 capsule (100 mg total) by mouth 2 (two) times daily for 21 days. 10/15/19 11/05/19  Wieters, Hallie C, PA-C  ELIQUIS 5 MG TABS tablet TAKE 1 TABLET BY MOUTH TWICE A DAY 04/17/19   Camnitz, Ocie Doyne, MD  furosemide (LASIX) 40 MG tablet Take 1 tablet (40 mg total) by mouth daily as needed. 06/24/18 12/12/18  Camnitz, Ocie Doyne, MD  glucose blood [facility] VERIO) test strip 1 each by Other route as needed for other. Use as instructed 02/04/18   Elayne Snare, MD  insulin aspart (NOVOLOG) 100 UNIT/ML injection INJECT 15-16 UNITS IN THE SKIN 3 TIMES DAILY 08/16/19   Elayne Snare, MD  insulin NPH Human (NOVOLIN N) 100 UNIT/ML injection INJECT 22 UNITS IN THE SKIN DAILY 02/08/19   Elayne Snare, MD  lisinopril (ZESTRIL) 5 MG tablet Take 1 tablet (5 mg total) by mouth daily. 02/15/19   Camnitz, Ocie Doyne, MD  metoprolol tartrate (LOPRESSOR) 50 MG tablet Take 0.5 tablets (25 mg total) by mouth 2 (two) times daily. 04/05/19   Camnitz, Ocie Doyne, MD    Family History Family History  Problem Relation Age of Onset  . Diabetes Mother   . Heart disease Mother   . Heart disease Father   . Heart attack Father   . Diabetes Father   . Hypertension Father   . Diabetes Sister     Social History Social History   Tobacco Use  . Smoking status: Former Smoker    Packs/day: 0.00    Years: 15.00    Pack years:  0.00    Types: Cigarettes    Quit date: 1990    Years since quitting: 31.4  . Smokeless tobacco: Never Used  . Tobacco comment: "quit smoking cigarettes in the 1980's; don't know how much or for how long"  Vaping Use  . Vaping Use: Never used  Substance Use Topics  . Alcohol use: Yes    Alcohol/week: 3.0 standard drinks    Types: 3 Cans of beer per week  . Drug use: No    Comment: "quit smoking pot in the 1990s"     Allergies  Actos [pioglitazone], Bee venom, Codeine, Statins, Glucophage [metformin hcl], and Raspberry   Review of Systems Review of Systems  Constitutional: Positive for chills, fatigue and fever. Negative for activity change and appetite change.  HENT: Negative for congestion, ear pain, rhinorrhea, sinus pressure, sore throat and trouble swallowing.   Eyes: Negative for discharge and redness.  Respiratory: Negative for cough, chest tightness and shortness of breath.   Cardiovascular: Negative for chest pain.  Gastrointestinal: Negative for abdominal pain, diarrhea, nausea and vomiting.  Musculoskeletal: Positive for myalgias.  Skin: Negative for rash.  Neurological: Positive for headaches. Negative for dizziness and light-headedness.     Physical Exam Triage Vital Signs ED Triage Vitals [10/15/19 1454]  Enc Vitals Group     BP (!) 161/71     Pulse Rate (!) 104     Resp 16     Temp 99.7 F (37.6 C)     Temp Source Oral     SpO2 96 %     Weight 191 lb (86.6 kg)     Height 5\' 6"  (1.676 m)     Head Circumference      Peak Flow      Pain Score 7     Pain Loc      Pain Edu?      Excl. in [place]?    No data found.  Updated Vital Signs BP (!) 161/71   Pulse (!) 104   Temp 99.7 F (37.6 C) (Oral)   Resp 16   Ht 5\' 6"  (1.676 m)   Wt 191 lb (86.6 kg)   SpO2 96%   BMI 30.83 kg/m   Visual Acuity Right Eye Distance:   Left Eye Distance:   Bilateral Distance:    Right Eye Near:   Left Eye Near:    Bilateral Near:     Physical Exam Vitals and  nursing note reviewed.  Constitutional:      Appearance: He is well-developed.     Comments: No acute distress  HENT:     Head: Normocephalic and atraumatic.     Nose: Nose normal.     Mouth/Throat:     Comments: Oral mucosa pink and moist, no tonsillar enlargement or exudate. Posterior pharynx patent and nonerythematous, no uvula deviation or swelling. Normal phonation.  Eyes:     Extraocular Movements: Extraocular movements intact.     Conjunctiva/sclera: Conjunctivae normal.     Pupils: Pupils are equal, round, and reactive to light.  Cardiovascular:     Rate and Rhythm: Normal rate.  Pulmonary:     Effort: Pulmonary effort is normal. No respiratory distress.     Comments: Breathing comfortably at rest, CTABL, no wheezing, rales or other adventitious sounds auscultated  Abdominal:     General: There is no distension.     Palpations: Abdomen is soft.     Tenderness: There is no abdominal tenderness.  Musculoskeletal:        General: Normal range of motion.     Cervical back: Neck supple.  Skin:    General: Skin is warm and dry.     Comments: No rashes noted Right mid anterior thigh with small area of erythema with central area of scabbing from bite   Neurological:     General: No focal deficit present.     Mental Status: He is alert and oriented to person, place, and time. Mental status is at baseline.     Cranial Nerves: No cranial nerve deficit.     Motor:  No weakness.     Gait: Gait normal.      UC Treatments / Results  Labs (all labs ordered are listed, but only abnormal results are displayed) Labs Reviewed  SARS CORONAVIRUS 2 (TAT 6-24 HRS)  ROCKY MTN SPOTTED FVR ABS PNL(IGG+IGM)  B. BURGDORFI ANTIBODIES    EKG   Radiology No results found.  Procedures Procedures (including critical care time)  Medications Ordered in UC Medications - No data to display  Initial Impression / Assessment and Plan / UC Course  I have reviewed the triage vital signs  and the nursing notes.  Pertinent labs & imaging results that were available during my care of the patient were reviewed by me and considered in my medical decision making (see chart for details).     Covid PCR pending given associated fevers body aches and chills, minimal associated URI symptoms.  Lungs clear.  Checking RMSF/Lyme antibodies, empirically placing on doxycycline.  Offered chest x-ray, patient declined.  Recommended to continue to monitor breathing and follow-up if symptoms not improving or worsening.  Rest and fluids.  Tylenol for fevers, patient is on Eliquis.  Discussed strict return precautions. Patient verbalized understanding and is agreeable with plan.  Final Clinical Impressions(s) / UC Diagnoses   Final diagnoses:  Fever, unspecified fever cause  Acute nonintractable headache, unspecified headache type     Discharge Instructions     Covid test pending, monitor my chart for results Blood work pending to check for [facility] spotted fever, Lyme disease-I will call only if abnormal Begin doxycycline twice daily to go ahead and cover for tickborne illness-if positive you will need to take for a full 21 days Tylenol every 4-6 hours as needed for fevers, body aches, headache Rest and drink plenty of fluids  Please follow-up if any symptoms not improving or worsening   ED Prescriptions    Medication Sig Dispense Auth. Provider   doxycycline (VIBRAMYCIN) 100 MG capsule Take 1 capsule (100 mg total) by mouth 2 (two) times daily for 21 days. 42 capsule Wieters, Piggott C, PA-C     PDMP not reviewed this encounter.   Janith Lima, PA-C 10/15/19 2252

## 2019-10-16 ENCOUNTER — Ambulatory Visit: Payer: Medicare HMO | Admitting: Endocrinology

## 2019-10-16 LAB — SARS CORONAVIRUS 2 (TAT 6-24 HRS): SARS Coronavirus 2: NEGATIVE

## 2019-10-17 LAB — ROCKY MTN SPOTTED FVR ABS PNL(IGG+IGM)
RMSF IgG: NEGATIVE
RMSF IgM: 0.25 index (ref 0.00–0.89)

## 2019-10-18 LAB — LYME, WESTERN BLOT, SERUM (REFLEXED)
IgG P18 Ab.: ABSENT
IgG P23 Ab.: ABSENT
IgG P28 Ab.: ABSENT
IgG P30 Ab.: ABSENT
IgG P45 Ab.: ABSENT
IgG P66 Ab.: ABSENT
IgM P23 Ab.: ABSENT
IgM P39 Ab.: ABSENT
IgM P41 Ab.: ABSENT
Lyme IgG Wb: NEGATIVE
Lyme IgM Wb: NEGATIVE

## 2019-10-18 LAB — B. BURGDORFI ANTIBODIES: B burgdorferi Ab IgG+IgM: 0.95 {ISR} — ABNORMAL HIGH (ref 0.00–0.90)

## 2019-10-26 ENCOUNTER — Other Ambulatory Visit: Payer: Self-pay | Admitting: Cardiology

## 2019-10-26 NOTE — Telephone Encounter (Signed)
Eliquis 5mg  refill request received. Patient is 74 years old, weight-86.6kg, Crea-1.16 on 10/11/2019, Diagnosis-Aflutter, and last seen by Dr. Curt Bears on 12/12/2018. Dose is appropriate based on dosing criteria. Will send in refill to requested pharmacy.

## 2019-11-07 ENCOUNTER — Ambulatory Visit: Payer: Medicare HMO | Admitting: Endocrinology

## 2019-11-16 ENCOUNTER — Other Ambulatory Visit: Payer: Self-pay | Admitting: Endocrinology

## 2019-11-17 ENCOUNTER — Other Ambulatory Visit: Payer: Self-pay | Admitting: Endocrinology

## 2019-11-20 ENCOUNTER — Other Ambulatory Visit: Payer: Self-pay

## 2019-11-20 ENCOUNTER — Encounter: Payer: Medicare HMO | Admitting: Endocrinology

## 2019-11-20 ENCOUNTER — Encounter: Payer: Self-pay | Admitting: Endocrinology

## 2019-11-20 NOTE — Progress Notes (Signed)
This encounter was created in error - please disregard.

## 2019-12-08 ENCOUNTER — Other Ambulatory Visit: Payer: Self-pay | Admitting: Cardiology

## 2019-12-09 ENCOUNTER — Other Ambulatory Visit: Payer: Self-pay | Admitting: Endocrinology

## 2019-12-11 DIAGNOSIS — Z86010 Personal history of colonic polyps: Secondary | ICD-10-CM | POA: Diagnosis not present

## 2019-12-12 ENCOUNTER — Telehealth: Payer: Self-pay | Admitting: *Deleted

## 2019-12-12 NOTE — Telephone Encounter (Signed)
Happy to discuss this at his next appointment.

## 2019-12-12 NOTE — Telephone Encounter (Signed)
Sending to PharmD for recommendations re: anticoagulation.  Dr. Curt Bears You see him 12/21/2019. Can you address clearance for procedure and send to requesting surgeon? Please send any recommendations back to  P CV DIV PREOP Richardson Dopp, PA-C    12/12/2019 4:45 PM

## 2019-12-12 NOTE — Telephone Encounter (Signed)
   [facility] Pre-operative Risk Assessment    HEARTCARE STAFF: - Please ensure there is not already an duplicate clearance open for this procedure. - Under Visit Info/Reason for Call, type in Other and utilize the format Clearance MM/DD/YY or Clearance TBD. Do not use dashes or single digits. - If request is for dental extraction, please clarify the # of teeth to be extracted.  Request for surgical clearance:  1. What type of surgery is being performed? COLONOSCOPY   2. When is this surgery scheduled? 01/31/20   3. What type of clearance is required (medical clearance vs. Pharmacy clearance to hold med vs. Both)? BOTH  4. Are there any medications that need to be held prior to surgery and how long? ELIQUIS   5. Practice name and name of physician performing surgery? [facility]; DR. Michail Sermon   6. What is the office phone number? [phone]   7.   What is the office fax number? [phone]  8.   Anesthesia type (None, local, MAC, general) ? NOT LISTED   Julaine Hua 12/12/2019, 9:56 AM  _________________________________________________________________   (provider comments below)

## 2019-12-13 NOTE — Telephone Encounter (Signed)
Patient with diagnosis of afib on Eliquis for anticoagulation.    Procedure: colonoscopy Date of procedure: 01/31/20  CHADS2-VASc score of 4 (age, CHF, DM, CAD)  CrCl 8mL/min Platelet count 271K  Per office protocol, patient can hold Eliquis for 1-2 days prior to procedure.

## 2019-12-14 ENCOUNTER — Telehealth: Payer: Self-pay | Admitting: Cardiology

## 2019-12-14 NOTE — Telephone Encounter (Signed)
Spoke with the patient who states that he is going to his endocrinologist for lab work prior to his appointment with Dr. Curt Bears on 08/26. He would like to know if Dr. Curt Bears needs any lab work and if so can he get it done all at the same time at the endocrinologist. He is not sure what lab work they are going to be drawing. I advised him that I would send a message to Dr. Curt Bears to see if needs any labs.

## 2019-12-14 NOTE — Telephone Encounter (Signed)
Patient states he is supposed to have labs done per Dr. Dwyane Dee on 8/26 and is hoping to get any labs done at the same time that Dr. Curt Bears needs. Please advise.

## 2019-12-15 NOTE — Telephone Encounter (Signed)
Advised he needs a CBC due to Eliquis. Pt appreciates my return call.

## 2019-12-21 ENCOUNTER — Ambulatory Visit: Payer: Medicare HMO | Admitting: Cardiology

## 2019-12-21 ENCOUNTER — Other Ambulatory Visit: Payer: Self-pay | Admitting: Endocrinology

## 2019-12-21 ENCOUNTER — Encounter: Payer: Self-pay | Admitting: Cardiology

## 2019-12-21 ENCOUNTER — Other Ambulatory Visit: Payer: Self-pay

## 2019-12-21 ENCOUNTER — Other Ambulatory Visit (INDEPENDENT_AMBULATORY_CARE_PROVIDER_SITE_OTHER): Payer: Medicare HMO

## 2019-12-21 VITALS — BP 150/58 | HR 77 | Ht 66.0 in | Wt 197.0 lb

## 2019-12-21 DIAGNOSIS — G720 Drug-induced myopathy: Secondary | ICD-10-CM

## 2019-12-21 DIAGNOSIS — Z794 Long term (current) use of insulin: Secondary | ICD-10-CM | POA: Diagnosis not present

## 2019-12-21 DIAGNOSIS — E7800 Pure hypercholesterolemia, unspecified: Secondary | ICD-10-CM

## 2019-12-21 DIAGNOSIS — T466X5A Adverse effect of antihyperlipidemic and antiarteriosclerotic drugs, initial encounter: Secondary | ICD-10-CM | POA: Diagnosis not present

## 2019-12-21 DIAGNOSIS — I484 Atypical atrial flutter: Secondary | ICD-10-CM | POA: Diagnosis not present

## 2019-12-21 DIAGNOSIS — E1165 Type 2 diabetes mellitus with hyperglycemia: Secondary | ICD-10-CM

## 2019-12-21 LAB — BASIC METABOLIC PANEL
BUN: 19 mg/dL (ref 6–23)
CO2: 29 mEq/L (ref 19–32)
Calcium: 9.1 mg/dL (ref 8.4–10.5)
Chloride: 102 mEq/L (ref 96–112)
Creatinine, Ser: 1.06 mg/dL (ref 0.40–1.50)
GFR: 68.74 mL/min (ref >60.00)
Glucose, Bld: 278 mg/dL — ABNORMAL HIGH (ref 70–99)
Potassium: 4.9 mEq/L (ref 3.5–5.1)
Sodium: 136 mEq/L (ref 135–145)

## 2019-12-21 LAB — LIPID PANEL
Cholesterol: 188 mg/dL (ref 0–200)
HDL: 48.3 mg/dL (ref >39.00)
LDL Cholesterol: 126 mg/dL — ABNORMAL HIGH (ref 0–99)
NonHDL: 139.8
Total CHOL/HDL Ratio: 4
Triglycerides: 71 mg/dL (ref 0.0–149.0)
VLDL: 14.2 mg/dL (ref 0.0–40.0)

## 2019-12-21 LAB — HEMOGLOBIN A1C: Hgb A1c MFr Bld: 8.2 % — ABNORMAL HIGH (ref 4.6–6.5)

## 2019-12-21 NOTE — Patient Instructions (Signed)
Medication Instructions:  Your physician recommends that you continue on your current medications as directed. Please refer to the Current Medication list given to you today.  *If you need a refill on your cardiac medications before your next appointment, please call your pharmacy*   Lab Work: None ordered If you have labs (blood work) drawn today and your tests are completely normal, you will receive your results only by: Marland Kitchen MyChart Message (if you have MyChart) OR . A paper copy in the mail If you have any lab test that is abnormal or we need to change your treatment, we will call you to review the results.   Testing/Procedures: None ordered   Follow-Up: At [facility], you and your health needs are our priority.  As part of our continuing mission to provide you with exceptional heart care, we have created designated Provider Care Teams.  These Care Teams include your primary Cardiologist (physician) and Advanced Practice Providers (APPs -  Physician Assistants and Nurse Practitioners) who all work together to provide you with the care you need, when you need it.  We recommend signing up for the patient portal called "MyChart".  Sign up information is provided on this After Visit Summary.  MyChart is used to connect with patients for Virtual Visits (Telemedicine).  Patients are able to view lab/test results, encounter notes, upcoming appointments, etc.  Non-urgent messages can be sent to your provider as well.   To learn more about what you can do with MyChart, go to [email or web address].    Your next appointment:   1 year(s)  The format for your next appointment:   In Person  Provider:   Allegra Lai, MD   Thank you for choosing [facility]!!   Trinidad Curet, RN [phone]    Other Instructions  You have been referred to our Lipid clinic.

## 2019-12-21 NOTE — Progress Notes (Addendum)
Electrophysiology Office Note   Date:  12/21/2019   ID:  Matthew [name], DOB [birth year], MRN [id number]  PCP:  Wendie Agreste, MD  Cardiologist:  Lovena Le Primary Electrophysiologist: [name], MD    No chief complaint on file.    History of Present Illness: Matthew [name] is a 74 y.o. male who is being seen today for the evaluation of atrial flutter at the request of Wendie Agreste, MD. Presenting today for electrophysiology evaluation. history of CAD hx of CABG 2011, AFlutter ablated, recurrent AFlutter with atypical morphology >> EPS noted LA flutter his atrial flutter isthmus was blocked, initially on Tikosyn with prolongation of his QT eventually on amiodarone, DM.  He was put on the amiodarone he felt tired, poor, though with reduction of the dose he did feel well.  Had ablation of typical atrial flutter on 10/31/14.  He subsequently developed a left atrial flutter and had ablation on 09/17/16 for atrial fibrillation and left atrial flutter.  Today, denies symptoms of palpitations, chest pain, shortness of breath, orthopnea, PND, claudication, dizziness, presyncope, syncope, bleeding, or neurologic sequela. The patient is tolerating medications without difficulties.  He is currently feeling well.  He has no chest pain or shortness of breath.  He is able do all of his daily activities.  He has plans for colonoscopy upcoming.  He has had some palpitations mainly at night that last up to a minute.  He is not much bothered by these.  He is also noted some lower extremity edema over the past few weeks.  He states that his physician has ordered tests and he is having proteinuria.  Past Medical History:  Diagnosis Date  . Anemia   . Arthritis    "back, left ankle" (11/20/2015)  . Cataract   . CHF (congestive heart failure) ([place])   . Chronic lower back pain   . Coronary artery disease    a. 05/2009 CABG x 3: LIMA->LAD, VG->OM, VG->PDA; b. Nuc 01/2014:  inf-lat scar but no ischemia, EF 57%.  . Diabetic retinopathy ([facility])    mild- Dr. Herbert Deaner  . Glaucoma   . Hyperlipidemia    a. Intolerant of lipitor and vytorin.  . Lyme disease   . Migraine    "once or twice" (11/20/2015)  . Myocardial infarction [facility])    'saw evidence of it on an EKG done in 2005"  . Paroxysmal atrial flutter ([place])    a. 01/2014 s/p DCCV;  b. CHA2DS2VASc = 3-->Eliquis.  . Pneumonia 1960  . Sacral pain    "right"  . Sciatica of right side   . Testicular cancer ([place]) 1982   "heavy doses of radiation; it was stage 2"  . Type II diabetes mellitus ([place])   . Valvular heart disease    a. 01/2014 Echo: Ef 60-65%, no rwma, mild AI/MS, mod MR, mildly dil LA.   Past Surgical History:  Procedure Laterality Date  . ABDOMINAL EXPLORATION SURGERY  ~ 2005   "for hernia, but didn't have one"  . ATRIAL FIBRILLATION ABLATION N/A 09/17/2016   Procedure: Atrial Fibrillation Ablation;  Surgeon: [name], MD;  Location: [place] [facility];  Service: Cardiovascular;  Laterality: N/A;  . CARDIAC CATHETERIZATION  2011  . CARDIOVERSION N/A 02/19/2014   Procedure: CARDIOVERSION;  Surgeon: [name], MD;  Location[facility] ENDOSCOPY;  Service: Cardiovascular;  Laterality: N/A;  . CARDIOVERSION N/A 10/28/2015   Procedure: CARDIOVERSION;  Surgeon: [name], MD;  Location: [facility] ENDOSCOPY;  Service: Cardiovascular;  Laterality: N/A;  . CARDIOVERSION  11/20/2015   "200 joules"  . CORONARY ARTERY BYPASS GRAFT  05/2009   LIMA-LAD, SVG-OM, SVG-PDA 06/20/09  . ELBOW FRACTURE SURGERY Left    broken ulna on left elbow-surgical repair  . ELECTROPHYSIOLOGIC STUDY N/A 10/31/2014   Procedure: A-Flutter;  Surgeon: [name], MD;  Location: [place] [facility];  Service: Cardiovascular;  Laterality: N/A;  . ELECTROPHYSIOLOGIC STUDY N/A 11/20/2015   Procedure: A-Flutter Ablation;  Surgeon: [name], MD;  Location: [place] [facility];  Service: Cardiovascular;  Laterality: N/A;  .  EYE SURGERY    . FRACTURE SURGERY    . TESTICLE REMOVAL Right 1982  . TONSILLECTOMY  ~ 1967     Current Outpatient Medications  Medication Sig Dispense Refill  . BD VEO INSULIN SYRINGE U/F 31G X 15/64" 0.5 ML MISC USE TO INJECT INSULIN 3 TIMES A DAY. REFILLABLE ONLY WITH FOLLOW UP 270 each 0  . bimatoprost (LUMIGAN) 0.01 % SOLN Place 1 drop into the left eye at bedtime.    Marland Kitchen ELIQUIS 5 MG TABS tablet TAKE 1 TABLET BY MOUTH TWICE A DAY 180 tablet 1  . furosemide (LASIX) 40 MG tablet Take 1 tablet (40 mg total) by mouth daily as needed. 90 tablet 1  . glucose blood (ONETOUCH VERIO) test strip 1 each by Other route as needed for other. Use as instructed 100 each 3  . insulin aspart (NOVOLOG) 100 UNIT/ML injection Inject 16 Units into the skin 3 (three) times daily with meals. INJECT 15-16 UNITS IN THE SKIN 3 TIMES DAILY 15 mL 0  . insulin NPH Human (NOVOLIN N) 100 UNIT/ML injection INJECT 22 UNITS IN THE SKIN DAILY 15 mL 0  . lisinopril (ZESTRIL) 5 MG tablet TAKE 1 TABLET BY MOUTH EVERY DAY 30 tablet 0  . metoprolol tartrate (LOPRESSOR) 50 MG tablet Take 0.5 tablets (25 mg total) by mouth 2 (two) times daily. 90 tablet 2   No current facility-administered medications for this visit.    Allergies:   Actos [pioglitazone], Bee venom, Codeine, Statins, Glucophage [metformin hcl], and Raspberry   Social History:  The patient  reports that he quit smoking about 31 years ago. His smoking use included cigarettes. He smoked 0.00 packs per day for 15.00 years. He has never used smokeless tobacco. He reports current alcohol use of about 3.0 standard drinks of alcohol per week. He reports that he does not use drugs.   Family History:  The patient's family history includes Diabetes in his father, mother, and sister; Heart attack in his father; Heart disease in his father and mother; Hypertension in his father.   ROS:  Please see the history of present illness.   Otherwise, review of systems is positive  for none.   All other systems are reviewed and negative.   PHYSICAL EXAM: VS:  BP (!) 150/58   Pulse 77   Ht 5\' 6"  (1.676 m)   Wt 197 lb (89.4 kg)   SpO2 96%   BMI 31.80 kg/m  , BMI Body mass index is 31.8 kg/m. GEN: Well nourished, well developed, in no acute distress  HEENT: normal  Neck: no JVD, carotid bruits, or masses Cardiac: RRR; no murmurs, rubs, or gallops,no edema  Respiratory:  clear to auscultation bilaterally, normal work of breathing GI: soft, nontender, nondistended, + BS MS: no deformity or atrophy  Skin: warm and dry Neuro:  Strength and sensation are intact Psych: euthymic  mood, full affect  EKG:  EKG is ordered today. Personal review of the ekg ordered shows sinus rhythm branch block, inferior infarct, PAC, first-degree AV block Recent Labs: 01/12/2019: ALT 15 10/11/2019: BUN 28; Creatinine, Ser 1.16; Potassium 4.8; Sodium 133    Lipid Panel     Component Value Date/Time   CHOL 226 (H) 01/12/2019 0920   CHOL 216 (H) 06/21/2013 1405   TRIG 101.0 01/12/2019 0920   TRIG 132 06/21/2013 1405   HDL 51.70 01/12/2019 0920   HDL 54 06/21/2013 1405   CHOLHDL 4 01/12/2019 0920   VLDL 20.2 01/12/2019 0920   LDLCALC 154 (H) 01/12/2019 0920   LDLCALC 136 (H) 06/21/2013 1405     Wt Readings from Last 3 Encounters:  12/21/19 197 lb (89.4 kg)  11/20/19 197 lb 12.8 oz (89.7 kg)  10/15/19 191 lb (86.6 kg)      Other studies Reviewed: Additional studies/ records that were reviewed today include: TTE  08/21/16 Review of the above records today demonstrates:  - Left ventricle: The cavity size was normal. Wall thickness was   increased in a pattern of mild LVH. Systolic function was normal.   The estimated ejection fraction was in the range of 55% to 60%.   Wall motion was normal; there were no regional wall motion   abnormalities. Features are consistent with a pseudonormal left   ventricular filling pattern, with concomitant abnormal relaxation   and increased  filling pressure (grade 2 diastolic dysfunction).   Doppler parameters are consistent with high ventricular filling   pressure. - Mitral valve: Severely calcified annulus. There was mild   regurgitation. - Left atrium: The atrium was mildly dilated.   ASSESSMENT AND PLAN:  1.  Atypical atrial flutter: Status post ablation 09/17/2016.  Currently on Eliquis and remains in sinus rhythm.  CHA2DS2-VASc of 4.    2. Coronary artery disease: No current chest pain.  Continue with current management.  3.  Hyperlipidemia: LDL cholesterol significantly elevated with a goal LDL of less than 70.  He has not tolerated statins in the past due to myopathy.  We Matthew [name] refer him to lipid clinic for possible Repatha.  4.  Preoperative cardiac evaluation: Plans for colonoscopy upcoming.  He does not have chest pain or shortness of breath.  Is currently feeling well and is without major complaint.  Due to that, he would be at intermediate risk for this low risk procedure.  It is okay at this point to hold his Eliquis for 2 days prior to his colonoscopy.  5.  Lower extremity edema: Has been having more edema over the past few weeks.  He does have Lasix to take as needed.  I told to take it for the next 3 days to see if this improves his edema.  Current medicines are reviewed at length with the patient today.   The patient does not have concerns regarding his medicines.  The following changes were made today: None  Labs/ tests ordered today include:  Orders Placed This Encounter  Procedures  . EKG 12-Lead     Disposition:   FU with Matthew [name] 12 months  Signed, Matthew Kastens Meredith Leeds, MD  12/21/2019 10:35 AM     [facility] [address] [place] [address] [phone] (office) [phone] (fax)

## 2019-12-21 NOTE — Addendum Note (Signed)
Addended by: Stanton Kidney on: 12/21/2019 12:51 PM   Modules accepted: Orders

## 2019-12-22 NOTE — Progress Notes (Signed)
   Primary Cardiologist: Matthew Meredith Leeds, MD  Chart reviewed as part of pre-operative protocol coverage. Given past medical history and time since last visit, based on ACC/AHA guidelines, Matthew [name] would be at acceptable risk for the planned procedure without further cardiovascular testing.   His Eliquis may be held for 48 hours prior to his procedure.  Please resume as soon as hemostasis is achieved.  I Matthew route this recommendation to the requesting party via Epic fax function and remove from pre-op pool.  Please call with questions.  Jossie Ng. Ziya Coonrod NP-C    12/22/2019, 8:05 AM [name] Office [phone] Fax [phone]

## 2019-12-25 ENCOUNTER — Ambulatory Visit: Payer: Medicare HMO | Admitting: Endocrinology

## 2019-12-25 ENCOUNTER — Encounter: Payer: Self-pay | Admitting: Endocrinology

## 2019-12-25 ENCOUNTER — Other Ambulatory Visit: Payer: Self-pay

## 2019-12-25 VITALS — BP 150/76 | HR 78 | Ht 66.0 in | Wt 200.4 lb

## 2019-12-25 DIAGNOSIS — E7800 Pure hypercholesterolemia, unspecified: Secondary | ICD-10-CM

## 2019-12-25 DIAGNOSIS — E1165 Type 2 diabetes mellitus with hyperglycemia: Secondary | ICD-10-CM

## 2019-12-25 DIAGNOSIS — Z794 Long term (current) use of insulin: Secondary | ICD-10-CM | POA: Diagnosis not present

## 2019-12-25 NOTE — Progress Notes (Signed)
Patient ID: Matthew [name], male   DOB: 10-Jan-1946, 74 y.o.   MRN: [id number]           Reason for Appointment: Followup for Type 2 Diabetes    History of Present Illness:          Diagnosis: Type 2 diabetes mellitus, date of diagnosis:  1992      Past history: His blood sugar was high at diagnosis when he was having a routine screening done. He was started on Glucophage initially which he took for about a year. He thinks it did not help his sugar much and he did not feel good with it Apparently he was trying to control his diabetes with diet and exercise for a few years. He may have tried Glucophage again before going on insulin. Also was started on Actos which caused swelling He has been treated mostly with premixed insulin for about 15 years and his level of control appears to be inadequate although details of previous treatment are not available. He may have taken Humalog at one time for use with high sugars A1c was 11.5 in 2011 On his initial consultation in 9/15 because of poor control with premixed insulin he was switched to Levemir twice a day and Humalog with meals.  Recent history:   INSULIN regimen is described as:20-22 N at 10pm ; Humalog 15-16 units with meals   His A1c is the same at 8.2 Fructosamine previously was 337  Has not been seen in follow-up since 9/20   Current management, blood sugar patterns and problems:    He is now taking NPH at bedtime instead of in the morning for the last 6 weeks  Previously taking it at 9 AM and prior to his last visit had stopped taking it at bedtime because he would forget to do so  He thinks he is mostly able to take his bedtime dose although not clear why his morning sugars are sometimes significantly higher  However he is adjusting the dose 1 or 2 units of the NPH based on what he is eating  He is not checking readings after dinner despite reminders  Mostly eating 3 meals a day although sometimes may skip  breakfast  Blood sugars at lunch and dinnertime are appearing to be relatively low but he likely is checking them only when he feels hypoglycemic  He has done some physical activity with chores like collecting firewood and mowing the lawn but no specific exercise  It does not appear to be adjusting his Premeal dose if he is trying to be active  Also sometimes will take his Humalog right after eating instead of before  He has been reluctant to go on on insulin pump  Finally he is now interested in entering freestyle libre monitoring    With hypoglycemia he has symptoms of weakness, sweating, shakiness and confusion Treating low sugars with orange juice       Oral hypoglycemic drugs the patient is taking are: None       Side effects from medications have been: Edema from Actos,? Nausea and malaise from metformin, polyuria from [place]  Compliance with the medical regimen: Fair  Glucose monitoring:  done less than one time a day         Glucometer: One Touch.      Blood Glucose readings from download:    PRE-MEAL Fasting Lunch Dinner Bedtime Overall  Glucose range:  70-285  43, 73  49-225    /median:  129  82   114   POST-MEAL PC Breakfast PC Lunch PC Dinner  Glucose range:   ?  Mean/median:       Previous readings:  PRE-MEAL Fasting Lunch Dinner Bedtime Overall  Glucose range:  97-313   62  56   Mean      145+/-116   POST-MEAL PC Breakfast PC Lunch PC Dinner  Glucose range:    44-347  Mean/median:       Self-care:   Meals: 2-3 meals per day.  Bfst 8:30-9, 1 pm Dinner is about 8 PM.  Some meals are high fat       Exercise: Some yardwork         Dietician visit, most recent:?  15 years ago.      CDE visit: 3/16            Weight history:  Wt Readings from Last 3 Encounters:  12/25/19 200 lb 6.4 oz (90.9 kg)  12/21/19 197 lb (89.4 kg)  11/20/19 197 lb 12.8 oz (89.7 kg)    Glycemic control:   Lab Results  Component Value Date   HGBA1C 8.2 (H) 12/21/2019    HGBA1C 8.2 (H) 01/12/2019   HGBA1C 8.1 (A) 03/10/2018   Lab Results  Component Value Date   MICROALBUR 7.5 (H) 10/11/2019   LDLCALC 126 (H) 12/21/2019   CREATININE 1.06 12/21/2019    Lab on 12/21/2019  Component Date Value Ref Range Status  . Cholesterol 12/21/2019 188  0 - 200 mg/dL Final   ATP [name] Classification       Desirable:  < 200 mg/dL               Borderline High:  200 - 239 mg/dL          High:  > = 240 mg/dL  . Triglycerides 12/21/2019 71.0  0 - 149 mg/dL Final   Normal:  <150 mg/dLBorderline High:  150 - 199 mg/dL  . HDL 12/21/2019 48.30  >39.00 mg/dL Final  . VLDL 12/21/2019 14.2  0.0 - 40.0 mg/dL Final  . LDL Cholesterol 12/21/2019 126* 0 - 99 mg/dL Final  . Total CHOL/HDL Ratio 12/21/2019 4   Final                  Men          Women1/2 Average Risk     3.4          3.3Average Risk          5.0          4.42X Average Risk          9.6          7.13X Average Risk          15.0          11.0                      . NonHDL 12/21/2019 139.80   Final   NOTE:  Non-HDL goal should be 30 mg/dL higher than patient's LDL goal (i.e. LDL goal of < 70 mg/dL, would have non-HDL goal of < 100 mg/dL)  . Sodium 12/21/2019 136  135 - 145 mEq/L Final  . Potassium 12/21/2019 4.9  3.5 - 5.1 mEq/L Final  . Chloride 12/21/2019 102  96 - 112 mEq/L Final  . CO2 12/21/2019 29  19 - 32 mEq/L Final  . Glucose, Bld 12/21/2019 278* 70 - 99 mg/dL Final  .  BUN 12/21/2019 19  6 - 23 mg/dL Final  . Creatinine, Ser 12/21/2019 1.06  0.40 - 1.50 mg/dL Final  . GFR 12/21/2019 68.74  >60.00 mL/min Final  . Calcium 12/21/2019 9.1  8.4 - 10.5 mg/dL Final  . Hgb A1c MFr Bld 12/21/2019 8.2* 4.6 - 6.5 % Final   Glycemic Control Guidelines for People with Diabetes:Non Diabetic:  <6%Goal of Therapy: <7%Additional Action Suggested:  >8%       Allergies as of 12/25/2019      Reactions   Actos [pioglitazone] Swelling   Bee Venom Swelling   Codeine Hives, Nausea Only   Statins Other (See Comments)    Muscle pain, fatigue   Glucophage [metformin Hcl] Nausea Only   Raspberry Itching      Medication List       Accurate as of Nhan 30, 2021  2:45 PM. If you have any questions, ask your nurse or doctor.        BD Veo Insulin Syringe U/F 31G X 15/64" 0.5 ML Misc Generic drug: Insulin Syringe-Needle U-100 USE TO INJECT INSULIN 3 TIMES A DAY. REFILLABLE ONLY WITH FOLLOW UP   bimatoprost 0.01 % Soln Commonly known as: LUMIGAN Place 1 drop into the left eye at bedtime.   Eliquis 5 MG Tabs tablet Generic drug: apixaban TAKE 1 TABLET BY MOUTH TWICE A DAY   furosemide 40 MG tablet Commonly known as: LASIX Take 1 tablet (40 mg total) by mouth daily as needed.   glucose blood test strip Commonly known as: OneTouch Verio 1 each by Other route as needed for other. Use as instructed   insulin aspart 100 UNIT/ML injection Commonly known as: NovoLOG Inject 16 Units into the skin 3 (three) times daily with meals. INJECT 15-16 UNITS IN THE SKIN 3 TIMES DAILY   insulin NPH Human 100 UNIT/ML injection Commonly known as: NovoLIN N INJECT 22 UNITS IN THE SKIN DAILY   lisinopril 5 MG tablet Commonly known as: ZESTRIL TAKE 1 TABLET BY MOUTH EVERY DAY   metoprolol tartrate 50 MG tablet Commonly known as: LOPRESSOR Take 0.5 tablets (25 mg total) by mouth 2 (two) times daily.       Allergies:  Allergies  Allergen Reactions  . Actos [Pioglitazone] Swelling  . Bee Venom Swelling  . Codeine Hives and Nausea Only  . Statins Other (See Comments)    Muscle pain, fatigue  . Glucophage [Metformin Hcl] Nausea Only  . Raspberry Itching    Past Medical History:  Diagnosis Date  . Anemia   . Arthritis    "back, left ankle" (11/20/2015)  . Cataract   . CHF (congestive heart failure) ([place])   . Chronic lower back pain   . Coronary artery disease    a. 05/2009 CABG x 3: LIMA->LAD, VG->OM, VG->PDA; b. Nuc 01/2014: inf-lat scar but no ischemia, EF 57%.  . Diabetic retinopathy ([facility])     mild- Dr. Herbert Deaner  . Glaucoma   . Hyperlipidemia    a. Intolerant of lipitor and vytorin.  . Lyme disease   . Migraine    "once or twice" (11/20/2015)  . Myocardial infarction [facility])    'saw evidence of it on an EKG done in 2005"  . Paroxysmal atrial flutter ([place])    a. 01/2014 s/p DCCV;  b. CHA2DS2VASc = 3-->Eliquis.  . Pneumonia 1960  . Sacral pain    "right"  . Sciatica of right side   . Testicular cancer ([place]) 1982   "heavy doses of radiation; it  was stage 2"  . Type II diabetes mellitus ([place])   . Valvular heart disease    a. 01/2014 Echo: Ef 60-65%, no rwma, mild AI/MS, mod MR, mildly dil LA.    Past Surgical History:  Procedure Laterality Date  . ABDOMINAL EXPLORATION SURGERY  ~ 2005   "for hernia, but didn't have one"  . ATRIAL FIBRILLATION ABLATION N/A 09/17/2016   Procedure: Atrial Fibrillation Ablation;  Surgeon: [name], MD;  Location: [facility];  Service: Cardiovascular;  Laterality: N/A;  . CARDIAC CATHETERIZATION  2011  . CARDIOVERSION N/A 02/19/2014   Procedure: CARDIOVERSION;  Surgeon: [name], MD;  [facility] ENDOSCOPY;  Service: Cardiovascular;  Laterality: N/A;  . CARDIOVERSION N/A 10/28/2015   Procedure: CARDIOVERSION;  Surgeon: [name], MD;  Location: [facility] ENDOSCOPY;  Service: Cardiovascular;  Laterality: N/A;  . CARDIOVERSION  11/20/2015   "200 joules"  . CORONARY ARTERY BYPASS GRAFT  05/2009   LIMA-LAD, SVG-OM, SVG-PDA 06/20/09  . ELBOW FRACTURE SURGERY Left    broken ulna on left elbow-surgical repair  . ELECTROPHYSIOLOGIC STUDY N/A 10/31/2014   Procedure: A-Flutter;  Surgeon: [name], MD;  Location: [place];  Service: Cardiovascular;  Laterality: N/A;  . ELECTROPHYSIOLOGIC STUDY N/A 11/20/2015   Procedure: A-Flutter Ablation;  Surgeon: [name], MD;  Location: [facility];  Service: Cardiovascular;  Laterality: N/A;  . EYE SURGERY    . FRACTURE SURGERY    . TESTICLE REMOVAL Right 1982   . TONSILLECTOMY  ~ 13    Family History  Problem Relation Age of Onset  . Diabetes Mother   . Heart disease Mother   . Heart disease Father   . Heart attack Father   . Diabetes Father   . Hypertension Father   . Diabetes Sister     Social History:  reports that he quit smoking about 31 years ago. His smoking use included cigarettes. He smoked 0.00 packs per day for 15.00 years. He has never used smokeless tobacco. He reports current alcohol use of about 3.0 standard drinks of alcohol per week. He reports that he does not use drugs.    Review of Systems         Lipids: He was previously on Vytorin and Lipitor. He thinks he had myalgias and weakness when taking these drugs.  He has been recommended statin drugs in the lipid clinic but he refuses to take them stating that they will not help him.   Also has significantly increased LDL particle number  He will not take non-statin medications at all includig Zetia also   Lab Results  Component Value Date   CHOL 188 12/21/2019   HDL 48.30 12/21/2019   LDLCALC 126 (H) 12/21/2019   TRIG 71.0 12/21/2019   CHOLHDL 4 12/21/2019    He has been  treated by cardiologist for his atrial fibrillation    Physical Examination:  BP (!) 150/76 (BP Location: Left Arm, Patient Position: Sitting, Cuff Size: Normal)   Pulse 78   Ht 5\' 6"  (1.676 m)   Wt 200 lb 6.4 oz (90.9 kg)   SpO2 97%   BMI 32.35 kg/m     ASSESSMENT:  Diabetes type 2, insulin-dependent and uncontrolled   See history of present illness for detailed discussion of current diabetes management, blood sugar patterns and problems identified  His A1c is 8.2  Difficult to assess his blood sugar patterns since he is mostly checking blood sugars in  the morning Morning sugars are fluctuating and not clear if some of the high readings are from forgetting the nighttime NPH Also is not checking his blood sugars after dinner He is not understanding the concept of  adjusting suppertime dose based on what he is planning to eat and start eating 2-hour postprandial readings Discussed differences between basal and bolus insulin He is interested in the freestyle [place] but not the closed-loop insulin pump which would otherwise help him  No microalbuminuria  LIPIDS: Still untreated previously he has refused to take statin drugs despite history of coronary disease  PLAN:    Repeated the need for readings after meals  Even with using freestyle Elenor Legato will need to check blood sugar 3-4 times a day  Showed him how the freestyle Elenor Legato works and how to get the sensor as well as given him the list of precautions to keep the sensor from falling off  No change in basal insulin dose as yet  However he'll need to adjust his dinner dose based on what he is planning to eat as well as review postprandial readings for various meals  Reduce insulin at mealtimes based on  Exercise or activity level after meals especially at lunchtime  Make sure he takes Humalog when he starts eating or even 15 minutes before and not after  May consider using [place] or [place] if this will help his basal insulin requirement better based on his CGM Explained to the patient what GLP-1 drugs are, the sites of actions and the body, reduction of hunger sensation and improved insulin secretion.  Discussed the benefit of weight loss with these medications. Explained possible side effects of OZEMPIC, most commonly nausea that usually improves over time.  Also explained safety information associated with the medication Demonstrated the medication injection device and injection technique to the patient.  To start the injections with the 0.25 mg dosage weekly for the first 4 injections and then go up to 0.5 mg weekly if no continued nausea He will start with a sample of the Ozempic  Patient brochure on Ozempic with dosage sheet given He will need to likely reduce his insulin 2 to 4 units after 1  to 2 weeks based on his blood sugars  He will follow-up with his PCP regarding his concerns for anemia  May consider using Nexletol for hyperlipidemia, his coronary artery disease making his LDL target 70  Also needs regular follow-up   Patient Instructions  Check blood sugars on waking up 7 days a week  Also check blood sugars about 2 hours after meals and do this after different meals by rotation  Recommended blood sugar levels on waking up are 90-130 and about 2 hours after meal is 130-160  Please bring your blood sugar monitor to each visit, thank you  Start OZEMPIC injections by dialing 0.25 mg on the pen as shown once weekly on the same day of the week.   You may inject in the sides of the stomach, outer thigh or arm as indicated in the brochure given. If you have any difficulties using the pen see the video at [email or web address].co.za  You will feel fullness of the stomach with starting the medication and should try to keep the portions at meals small.  You may experience nausea in the first few days which usually gets better over time    After 4 weeks increase the dose to 0.5 mg weekly  If you have any questions or persistent side effects please call the  office   You may also talk to a nurse educator with [employer] at [phone] Useful website: [email or web address]              Elayne Snare 12/25/2019, 2:45 PM   Note: This office note was prepared with Dragon voice recognition system technology. Any transcriptional errors that result from this process are unintentional.

## 2019-12-25 NOTE — Patient Instructions (Signed)
Check blood sugars on waking up 7 days a week  Also check blood sugars about 2 hours after meals and do this after different meals by rotation  Recommended blood sugar levels on waking up are 90-130 and about 2 hours after meal is 130-160  Please bring your blood sugar monitor to each visit, thank you  Start OZEMPIC injections by dialing 0.25 mg on the pen as shown once weekly on the same day of the week.   You may inject in the sides of the stomach, outer thigh or arm as indicated in the brochure given. If you have any difficulties using the pen see the video at CompPlans.co.za  You will feel fullness of the stomach with starting the medication and should try to keep the portions at meals small.  You may experience nausea in the first few days which usually gets better over time    After 4 weeks increase the dose to 0.5 mg weekly  If you have any questions or persistent side effects please call the office   You may also talk to a nurse educator with [employer] at [phone] Useful website: [email or web address]

## 2020-01-07 ENCOUNTER — Other Ambulatory Visit: Payer: Self-pay | Admitting: Cardiology

## 2020-01-07 DIAGNOSIS — I483 Typical atrial flutter: Secondary | ICD-10-CM

## 2020-01-08 ENCOUNTER — Other Ambulatory Visit: Payer: Self-pay | Admitting: Cardiology

## 2020-01-08 NOTE — Progress Notes (Signed)
Patient ID: Matthew [name]                 DOB: 08/05/1945                    MRN: [id number]     HPI: Matthew [name] is a 74 y.o. male patient referred to lipid clinic by Dr. Curt Bears. PMH is significant for CAD s/p CABG (2011), CHF, MI (2015), atrial fibrillation, valvular heart disease, T2DM, HLD.  Patient presents today in good spirits. Reports he has not been on any lipid-lowering medications for years. Reports muscle aches and hair loss with atorvastatin and rosuvastatin. Reports he is currently in the donut hole regarding prescriptions (takes Eliquis and insulin).  Current Medications: none Intolerances: atorvastatin 10 mg daily, rosuvastatin 5 mg daily and MWF - muscle pain, fatigue Risk Factors: CAD, T2DM, HLD, former smoker, Fhx of heart disease   LDL goal: < 55 mg/dL  Diet:  Breakfast: grits Lunch: sandwich Dinner: homemade enchiladas, salad, limits red meat, chicken, vegetables   Exercise: bad back - hard to walk.  Family History: Diabetes in his father, mother, and sister; Heart attack in his father; Heart disease in his father and mother; Hypertension in his father  Social History: former smoker (quit 1990); denies smokeless tobacco; reports current alcohol use of about 3.0 standard drinks of alcohol per week. He reports that he does not use drugs.   Labs: 12/21/19: LDL 126, TC 188, TG 71, HDL 48.30 (no therapy) 01/12/19: LDL 154, TC 226, TG 101, HDL 51 (no therapy)  Past Medical History:  Diagnosis Date  . Anemia   . Arthritis    "back, left ankle" (11/20/2015)  . Cataract   . CHF (congestive heart failure) ([place])   . Chronic lower back pain   . Coronary artery disease    a. 05/2009 CABG x 3: LIMA->LAD, VG->OM, VG->PDA; b. Nuc 01/2014: inf-lat scar but no ischemia, EF 57%.  . Diabetic retinopathy (HCC)    mild- Dr. Herbert Deaner  . Glaucoma   . Hyperlipidemia    a. Intolerant of lipitor and vytorin.  . Lyme disease   . Migraine    "once or twice"  (11/20/2015)  . Myocardial infarction [facility])    'saw evidence of it on an EKG done in 2005"  . Paroxysmal atrial flutter ([name])    a. 01/2014 s/p DCCV;  b. CHA2DS2VASc = 3-->Eliquis.  . Pneumonia 1960  . Sacral pain    "right"  . Sciatica of right side   . Testicular cancer ([place]) 1982   "heavy doses of radiation; it was stage 2"  . Type II diabetes mellitus ([place])   . Valvular heart disease    a. 01/2014 Echo: Ef 60-65%, no rwma, mild AI/MS, mod MR, mildly dil LA.    Current Outpatient Medications on File Prior to Visit  Medication Sig Dispense Refill  . BD VEO INSULIN SYRINGE U/F 31G X 15/64" 0.5 ML MISC USE TO INJECT INSULIN 3 TIMES A DAY. REFILLABLE ONLY WITH FOLLOW UP 270 each 0  . bimatoprost (LUMIGAN) 0.01 % SOLN Place 1 drop into the left eye at bedtime.    Marland Kitchen ELIQUIS 5 MG TABS tablet TAKE 1 TABLET BY MOUTH TWICE A DAY 180 tablet 1  . furosemide (LASIX) 40 MG tablet Take 1 tablet (40 mg total) by mouth daily as needed. 90 tablet 1  . glucose blood (ONETOUCH VERIO) test strip 1 each by Other route as needed for other.  Use as instructed 100 each 3  . insulin aspart (NOVOLOG) 100 UNIT/ML injection Inject 16 Units into the skin 3 (three) times daily with meals. INJECT 15-16 UNITS IN THE SKIN 3 TIMES DAILY 15 mL 0  . insulin NPH Human (NOVOLIN N) 100 UNIT/ML injection INJECT 22 UNITS IN THE SKIN DAILY 15 mL 0  . lisinopril (ZESTRIL) 5 MG tablet TAKE 1 TABLET BY MOUTH EVERY DAY 30 tablet 0  . metoprolol tartrate (LOPRESSOR) 50 MG tablet Take 0.5 tablets (25 mg total) by mouth 2 (two) times daily. 90 tablet 2   No current facility-administered medications on file prior to visit.    Allergies  Allergen Reactions  . Actos [Pioglitazone] Swelling  . Bee Venom Swelling  . Codeine Hives and Nausea Only  . Statins Other (See Comments)    Muscle pain, fatigue  . Glucophage [Metformin Hcl] Nausea Only  . Raspberry Itching    Assessment/Plan:  1. Hyperlipidemia - LDL is above goal < 55  mg/dL due to significant history of heart disease (CAD, CABG, MI). Reports not on any lipid-lowering agent due to statin-intolerance. Discussed non-statin lipid lowering medications including Praluent and potential enrollment in the [place] 4 trial with Inclisiran. After discussion, patient is not interested in enrolling in the [place] 4 clinical trial, however is interested in starting Praluent. Discussed the clinical benefits, side-effects and proper injection technique with the teach back method. Prior authorization approved and copay cost is $122.46. Will plan to start Praluent 75 mg/mL SQ every 2 weeks and schedule labs for fasting lipid panel and LFTs in 3 months. Can consider starting Zetia 10 mg daily or low-dose pravastatin if cost is prohibitive. Discussed healthy eating options such as a diet full of vegetables, fruit and lean meats (chicken, [place], fish) and to limit carbs (bread, pasta, sugar, rice) and red meat consumption. Encouraged patient to exercise to the best of his abilities.   $122.46  Lorel Monaco, PharmD PGY2 [facility] [address] [place], [place] [address] Phone: [phone]; Fax: [phone]

## 2020-01-08 NOTE — Patient Instructions (Signed)
Nice to see you today!  Keep up the good work with diet and exercise. Aim for a diet full of vegetables, fruit and lean meats (chicken, [place], fish). Try to limit carbs (bread, pasta, sugar, rice) and red meat consumption.  Your goal LDL is <55 mg/dL, you're currently at 126 mg/dL  Medication Changes: We will give you a call once Praluent is ready for pick up from your pharmacy.  Inject Praluent 75 mg subcutaneously into the lower abdomen area every 2 weeks  Please give [place] a call at [phone] with any questions or concerns.  For [id number], inject once every other week (any day of the week that works for you) into the fatty skin of stomach, upper outer thigh or back of the arm. Clean the site with soap and warm water or an alcohol pad. Keep the medication in the fridge until you are ready to give your dose, then take it out and let warm up to room temperature for 30-60 mins.

## 2020-01-09 ENCOUNTER — Other Ambulatory Visit: Payer: Self-pay | Admitting: Endocrinology

## 2020-01-09 ENCOUNTER — Ambulatory Visit (INDEPENDENT_AMBULATORY_CARE_PROVIDER_SITE_OTHER): Payer: Medicare HMO | Admitting: Pharmacist

## 2020-01-09 ENCOUNTER — Other Ambulatory Visit: Payer: Self-pay

## 2020-01-09 DIAGNOSIS — G720 Drug-induced myopathy: Secondary | ICD-10-CM

## 2020-01-09 DIAGNOSIS — T466X5A Adverse effect of antihyperlipidemic and antiarteriosclerotic drugs, initial encounter: Secondary | ICD-10-CM

## 2020-01-09 DIAGNOSIS — E7800 Pure hypercholesterolemia, unspecified: Secondary | ICD-10-CM

## 2020-01-09 MED ORDER — PRALUENT 75 MG/ML ~~LOC~~ SOAJ: 75.0000 mg | SUBCUTANEOUS | 11 refills | Status: DC

## 2020-01-10 ENCOUNTER — Telehealth: Payer: Self-pay | Admitting: Pharmacist

## 2020-01-10 DIAGNOSIS — E7800 Pure hypercholesterolemia, unspecified: Secondary | ICD-10-CM

## 2020-01-10 NOTE — Telephone Encounter (Signed)
error 

## 2020-01-10 NOTE — Telephone Encounter (Signed)
Pt returned call to clinic. Advised him that Praluent will cost $122 per month. He states he will think about starting therapy. Advised we can retry low dose pravastatin or ezetimibe instead, although these options would be less effective. He will give [place] a call back to let us know which option he would like to start, and is aware that the Praluent prescription has already been called into his pharmacy if he wishes to start.

## 2020-01-21 ENCOUNTER — Other Ambulatory Visit: Payer: Self-pay

## 2020-01-21 ENCOUNTER — Ambulatory Visit
Admission: RE | Admit: 2020-01-21 | Discharge: 2020-01-21 | Disposition: A | Discharge status: Home / Self Care | Payer: Medicare HMO | Source: Ambulatory Visit | Attending: Emergency Medicine | Admitting: Emergency Medicine

## 2020-01-21 VITALS — BP 168/91 | HR 76 | Temp 98.0°F | Resp 15

## 2020-01-21 DIAGNOSIS — H60332 Swimmer's ear, left ear: Secondary | ICD-10-CM | POA: Diagnosis not present

## 2020-01-21 MED ORDER — NEOMYCIN-POLYMYXIN-HC 3.5-10000-1 OT SOLN: 3.0000 [drp] | Freq: Three times a day (TID) | OTIC | 0 refills | Status: DC

## 2020-01-21 MED ORDER — PREDNISONE 50 MG PO TABS: 50.0000 mg | ORAL_TABLET | Freq: Every day | ORAL | 0 refills | Status: AC

## 2020-01-21 NOTE — ED Provider Notes (Signed)
[facility]    CSN: [id number] Arrival date & time: 01/21/20  6767      History   Chief Complaint Chief Complaint  Patient presents with  . Otalgia    left side    HPI Matthew [name] is a 74 y.o. male  Presenting for left ear pain and swelling for the last 5 days.  States that he noticed blood on his pillow Thursday.  None since.  No fever, nasal congestion, sore throat.  Denies discharge, trauma or travel.  [place]  Past Medical History:  Diagnosis Date  . Anemia   . Arthritis    "back, left ankle" (11/20/2015)  . Cataract   . CHF (congestive heart failure) ([place])   . Chronic lower back pain   . Coronary artery disease    a. 05/2009 CABG x 3: LIMA->LAD, VG->OM, VG->PDA; b. Nuc 01/2014: inf-lat scar but no ischemia, EF 57%.  . Diabetic retinopathy (HCC)    mild- Dr. Herbert Deaner  . Glaucoma   . Hyperlipidemia    a. Intolerant of lipitor and vytorin.  . Lyme disease   . Migraine    "once or twice" (11/20/2015)  . Myocardial infarction [facility])    'saw evidence of it on an EKG done in 2005"  . Paroxysmal atrial flutter ([place])    a. 01/2014 s/p DCCV;  b. CHA2DS2VASc = 3-->Eliquis.  . Pneumonia 1960  . Sacral pain    "right"  . Sciatica of right side   . Testicular cancer ([place]) 1982   "heavy doses of radiation; it was stage 2"  . Type II diabetes mellitus ([place])   . Valvular heart disease    a. 01/2014 Echo: Ef 60-65%, no rwma, mild AI/MS, mod MR, mildly dil LA.    Patient Active Problem List   Diagnosis Date Noted  . Statin myopathy 09/15/2019  . Atrial flutter ([place]) 10/31/2014  . Coronary artery disease   . Paroxysmal atrial flutter ([place])   . Diabetes mellitus without complication ([place])   . Valvular heart disease   . Atrial fibrillation, unspecified 01/25/2014  . Diabetes mellitus, type 2 ([place]) 01/16/2014  . Hyperlipidemia 01/16/2014  . Erectile dysfunction associated with type 2 diabetes mellitus ([place])  01/16/2014  . Coronary atherosclerosis of native coronary artery 06/30/2013  . Old myocardial infarction 06/30/2013  . Pure hypercholesterolemia 06/30/2013    Past Surgical History:  Procedure Laterality Date  . ABDOMINAL EXPLORATION SURGERY  ~ 2005   "for hernia, but didn't have one"  . ATRIAL FIBRILLATION ABLATION N/A 09/17/2016   Procedure: Atrial Fibrillation Ablation;  Surgeon: [name], MD;  Location: [facility];  Service: Cardiovascular;  Laterality: N/A;  . CARDIAC CATHETERIZATION  2011  . CARDIOVERSION N/A 02/19/2014   Procedure: CARDIOVERSION;  Surgeon: [name], MD;  Location[facility] ENDOSCOPY;  Service: Cardiovascular;  Laterality: N/A;  . CARDIOVERSION N/A 10/28/2015   Procedure: CARDIOVERSION;  Surgeon: [name], MD;  Location: [facility] ENDOSCOPY;  Service: Cardiovascular;  Laterality: N/A;  . CARDIOVERSION  11/20/2015   "200 joules"  . CORONARY ARTERY BYPASS GRAFT  05/2009   LIMA-LAD, SVG-OM, SVG-PDA 06/20/09  . ELBOW FRACTURE SURGERY Left    broken ulna on left elbow-surgical repair  . ELECTROPHYSIOLOGIC STUDY N/A 10/31/2014   Procedure: A-Flutter;  Surgeon: [name], MD;  Location: [facility];  Service: Cardiovascular;  Laterality: N/A;  . ELECTROPHYSIOLOGIC STUDY N/A 11/20/2015   Procedure:  A-Flutter Ablation;  Surgeon: [name], MD;  Location: [facility];  Service: Cardiovascular;  Laterality: N/A;  . EYE SURGERY    . FRACTURE SURGERY    . TESTICLE REMOVAL Right 1982  . TONSILLECTOMY  ~ [place] Medications    Prior to Admission medications   Medication Sig Start Date End Date Taking? Authorizing Provider  Alirocumab (PRALUENT) 75 MG/ML SOAJ Inject 75 mg into the skin every 14 (fourteen) days. 01/09/20   Camnitz, Ocie Doyne, MD  BD VEO INSULIN SYRINGE U/F 31G X 15/64" 0.5 ML MISC USE TO INJECT INSULIN 3 TIMES A DAY. REFILLABLE ONLY WITH FOLLOW UP 11/16/19   Elayne Snare, MD  bimatoprost (LUMIGAN) 0.01 %  SOLN Place 1 drop into the left eye at bedtime.    [provider]  ELIQUIS 5 MG TABS tablet TAKE 1 TABLET BY MOUTH TWICE A DAY 10/26/19   Camnitz, Ocie Doyne, MD  furosemide (LASIX) 40 MG tablet Take 1 tablet (40 mg total) by mouth daily as needed. 06/24/18 12/12/18  Camnitz, Ocie Doyne, MD  glucose blood [facility] VERIO) test strip 1 each by Other route as needed for other. Use as instructed 02/04/18   Elayne Snare, MD  insulin NPH Human (NOVOLIN N) 100 UNIT/ML injection INJECT 22 UNITS IN THE SKIN DAILY 11/17/19   Elayne Snare, MD  lisinopril (ZESTRIL) 5 MG tablet TAKE 1 TABLET BY MOUTH EVERY DAY 01/08/20   Camnitz, Ocie Doyne, MD  metoprolol tartrate (LOPRESSOR) 50 MG tablet TAKE 1/2 TABLET BY MOUTH TWICE A DAY 01/09/20   Camnitz, Ocie Doyne, MD  neomycin-polymyxin-hydrocortisone (CORTISPORIN) OTIC solution Place 3 drops into the left ear 3 (three) times daily. 01/21/20   Hall-Potvin, [place], PA-C  NOVOLOG 100 UNIT/ML injection INJECT 16 UNITS INTO THE SKIN 3 (THREE) TIMES DAILY WITH MEALS. INJECT 15-16 UNITS IN THE SKIN 3 TIMES DAILY 01/09/20   Elayne Snare, MD  predniSONE (DELTASONE) 50 MG tablet Take 1 tablet (50 mg total) by mouth daily with breakfast for 2 days. 01/21/20 01/23/20  Hall-Potvin, [place], PA-C    Family History Family History  Problem Relation Age of Onset  . Diabetes Mother   . Heart disease Mother   . Heart disease Father   . Heart attack Father   . Diabetes Father   . Hypertension Father   . Diabetes Sister     Social History Social History   Tobacco Use  . Smoking status: Former Smoker    Packs/day: 0.00    Years: 15.00    Pack years: 0.00    Types: Cigarettes    Quit date: 1990    Years since quitting: 31.7  . Smokeless tobacco: Never Used  . Tobacco comment: "quit smoking cigarettes in the 1980's; don't know how much or for how long"  Vaping Use  . Vaping Use: Never used  Substance Use Topics  . Alcohol use: Yes    Alcohol/week: 3.0 standard  drinks    Types: 3 Cans of beer per week  . Drug use: No    Comment: "quit smoking pot in the 1990s"     Allergies   Actos [pioglitazone], Bee venom, Codeine, Statins, Glucophage [metformin hcl], and Raspberry   Review of Systems As per HPI   Physical Exam Triage Vital Signs ED Triage Vitals  Enc Vitals Group     BP      Pulse      Resp      Temp  Temp src      SpO2      Weight      Height      Head Circumference      Peak Flow      Pain Score      Pain Loc      Pain Edu?      Excl. in [place]?    No data found.  Updated Vital Signs BP (!) 168/91 (BP Location: Left Arm)   Pulse 76   Temp 98 F (36.7 C) (Oral)   Resp 15   SpO2 96%   Visual Acuity Right Eye Distance:   Left Eye Distance:   Bilateral Distance:    Right Eye Near:   Left Eye Near:    Bilateral Near:     Physical Exam Constitutional:      General: He is not in acute distress. HENT:     Head: Normocephalic and atraumatic.     Right Ear: Tympanic membrane, ear canal and external ear normal.     Left Ear: Tympanic membrane normal.     Ears:     Comments: Left ear with tragal tenderness.  Significant EAC swelling with discharge. Eyes:     General: No scleral icterus.    Pupils: Pupils are equal, round, and reactive to light.  Cardiovascular:     Rate and Rhythm: Normal rate.  Pulmonary:     Effort: Pulmonary effort is normal. No respiratory distress.     Breath sounds: No wheezing.  Skin:    Coloration: Skin is not jaundiced or pale.  Neurological:     Mental Status: He is alert and oriented to person, place, and time.      UC Treatments / Results  Labs (all labs ordered are listed, but only abnormal results are displayed) Labs Reviewed - No data to display  EKG   Radiology No results found.  Procedures Procedures (including critical care time)  Medications Ordered in UC Medications - No data to display  Initial Impression / Assessment and Plan / UC Course  I have  reviewed the triage vital signs and the nursing notes.  Pertinent labs & imaging results that were available during my care of the patient were reviewed by me and considered in my medical decision making (see chart for details).     H&P consistent with AOE.  TM intact.  Will start Cortisporin, prednisone for swelling.  Return precautions discussed, pt verbalized understanding and is agreeable to plan. Final Clinical Impressions(s) / UC Diagnoses   Final diagnoses:  Acute swimmer's ear of left side     Discharge Instructions     Use eardrops as prescribed for the next week. Return for worsening ear pain, swelling, discharge, bleeding, decreased hearing, development of jaw pain/swelling, fever.  Do NOT use Q-tips as these can cause your ear wax to get stuck, the tips may break off and become a foreign body requiring additional medical care, or puncture your eardrum.  Helpful prevention tip: Use a solution of equal parts isopropyl (rubbing) alcohol and white vinegar (acetic acid) in both ears after swimming.    ED Prescriptions    Medication Sig Dispense Auth. Provider   neomycin-polymyxin-hydrocortisone (CORTISPORIN) OTIC solution Place 3 drops into the left ear 3 (three) times daily. 10 mL Hall-Potvin, [place], PA-C   predniSONE (DELTASONE) 50 MG tablet Take 1 tablet (50 mg total) by mouth daily with breakfast for 2 days. 2 tablet Hall-Potvin, [place], PA-C     PDMP not reviewed  this encounter.   Hall-Potvin, [place], [place] 01/21/20 [phone]

## 2020-01-21 NOTE — ED Triage Notes (Signed)
Patient c/o left ear pain that has persisted over the past 4-5 days. He states noticing blood on his pillow on Thursday when he woke up.

## 2020-01-21 NOTE — Discharge Instructions (Signed)
Use eardrops as prescribed for the next week. Return for worsening ear pain, swelling, discharge, bleeding, decreased hearing, development of jaw pain/swelling, fever.  Do NOT use Q-tips as these can cause your ear wax to get stuck, the tips may break off and become a foreign body requiring additional medical care, or puncture your eardrum.  Helpful prevention tip: Use a solution of equal parts isopropyl (rubbing) alcohol and white vinegar (acetic acid) in both ears after swimming. 

## 2020-01-24 ENCOUNTER — Telehealth: Payer: Self-pay | Admitting: Endocrinology

## 2020-02-06 ENCOUNTER — Telehealth: Payer: Self-pay | Admitting: Pharmacist

## 2020-02-06 ENCOUNTER — Ambulatory Visit: Payer: Medicare HMO | Admitting: Endocrinology

## 2020-02-06 DIAGNOSIS — E7800 Pure hypercholesterolemia, unspecified: Secondary | ICD-10-CM

## 2020-02-06 NOTE — Telephone Encounter (Signed)
Returned patient call regarding hyperlipidemia management. Patient reports that he has been super busy with traveling out of town due to work and has been unable to pick up [employer] from Pharmacy. Patient reports that he will try to pick up prescription in 1 week. Will plan to call patient in 1 month after ~two doses to assess tolerability and schedule follow-up fasting lipid panel and LFTs in 3 months.  Lorel Monaco, PharmD PGY2 [facility] [address] [place], [place] [address] Phone: [phone]; Fax: [phone]

## 2020-02-06 NOTE — Telephone Encounter (Signed)
Patient returning call.

## 2020-02-06 NOTE — Telephone Encounter (Signed)
Left a HIPAA compliant voicemail requesting patient to call pharmacy in lipid clinic regarding hyperlipidemia management to discuss alternative medications to reduce LDL.  Lorel Monaco, PharmD PGY2 Ambulatory Care Resident [place]

## 2020-02-07 NOTE — Telephone Encounter (Signed)
Addressed in other phone note.

## 2020-02-08 ENCOUNTER — Other Ambulatory Visit: Payer: Self-pay

## 2020-02-08 MED ORDER — INSULIN NPH (HUMAN) (ISOPHANE) 100 UNIT/ML ~~LOC~~ SUSP: SUBCUTANEOUS | 0 refills | Status: DC

## 2020-02-08 NOTE — Telephone Encounter (Signed)
Resent Novolin to pharmacy.

## 2020-02-08 NOTE — Telephone Encounter (Signed)
Patient called stating his pharmacy shows Humulin in their system that we called in on 9/29 - I informed patient on my end it shows Novolin N (which is what he needs) but they are saying they don't have that.   Can we please resend the [facility]olin N to [facility]? Patient states he's almost out of insulin.

## 2020-02-09 ENCOUNTER — Other Ambulatory Visit: Payer: Self-pay | Admitting: Cardiology

## 2020-02-13 ENCOUNTER — Other Ambulatory Visit: Payer: Self-pay | Admitting: *Deleted

## 2020-02-13 DIAGNOSIS — E1165 Type 2 diabetes mellitus with hyperglycemia: Secondary | ICD-10-CM

## 2020-02-13 DIAGNOSIS — Z794 Long term (current) use of insulin: Secondary | ICD-10-CM

## 2020-02-13 MED ORDER — INSULIN NPH (HUMAN) (ISOPHANE) 100 UNIT/ML ~~LOC~~ SUSP: SUBCUTANEOUS | 0 refills | Status: DC

## 2020-03-04 ENCOUNTER — Other Ambulatory Visit: Payer: Self-pay | Admitting: Endocrinology

## 2020-03-05 ENCOUNTER — Telehealth: Payer: Self-pay | Admitting: Pharmacist

## 2020-03-05 DIAGNOSIS — E7800 Pure hypercholesterolemia, unspecified: Secondary | ICD-10-CM

## 2020-03-05 NOTE — Telephone Encounter (Signed)
Contacted patient regarding hyperlipidemia management. Patient reports he has not started Praluent yet and is hesitant on starting new medications at the moment. Patient states he will give [place] a call once he starts Praluent so pharmacy can schedule labs. All questions were answered.

## 2020-03-10 ENCOUNTER — Other Ambulatory Visit: Payer: Self-pay | Admitting: Endocrinology

## 2020-03-10 DIAGNOSIS — Z794 Long term (current) use of insulin: Secondary | ICD-10-CM

## 2020-03-10 DIAGNOSIS — E1165 Type 2 diabetes mellitus with hyperglycemia: Secondary | ICD-10-CM

## 2020-03-17 DIAGNOSIS — R69 Illness, unspecified: Secondary | ICD-10-CM | POA: Diagnosis not present

## 2020-03-17 DIAGNOSIS — Z20822 Contact with and (suspected) exposure to covid-19: Secondary | ICD-10-CM | POA: Diagnosis not present

## 2020-03-24 ENCOUNTER — Other Ambulatory Visit: Payer: Self-pay | Admitting: Endocrinology

## 2020-03-24 DIAGNOSIS — R69 Illness, unspecified: Secondary | ICD-10-CM | POA: Diagnosis not present

## 2020-03-24 DIAGNOSIS — Z794 Long term (current) use of insulin: Secondary | ICD-10-CM

## 2020-03-24 DIAGNOSIS — E1165 Type 2 diabetes mellitus with hyperglycemia: Secondary | ICD-10-CM

## 2020-03-26 ENCOUNTER — Ambulatory Visit: Payer: Medicare HMO | Admitting: Endocrinology

## 2020-03-28 DIAGNOSIS — E113311 Type 2 diabetes mellitus with moderate nonproliferative diabetic retinopathy with macular edema, right eye: Secondary | ICD-10-CM | POA: Diagnosis not present

## 2020-03-28 DIAGNOSIS — H40021 Open angle with borderline findings, high risk, right eye: Secondary | ICD-10-CM | POA: Diagnosis not present

## 2020-03-28 DIAGNOSIS — H40052 Ocular hypertension, left eye: Secondary | ICD-10-CM | POA: Diagnosis not present

## 2020-03-28 DIAGNOSIS — H4032X1 Glaucoma secondary to eye trauma, left eye, mild stage: Secondary | ICD-10-CM | POA: Diagnosis not present

## 2020-03-28 DIAGNOSIS — E113392 Type 2 diabetes mellitus with moderate nonproliferative diabetic retinopathy without macular edema, left eye: Secondary | ICD-10-CM | POA: Diagnosis not present

## 2020-04-04 ENCOUNTER — Other Ambulatory Visit: Payer: Self-pay | Admitting: Cardiology

## 2020-04-05 NOTE — Telephone Encounter (Signed)
Eliquis 5mg  refill request received. Patient is 74 years old, weight-90.9kg, Crea-1.06 on 12/21/2019, Diagnosis-Afib, and last seen by Dr. Curt Bears on 12/21/2019. Dose is appropriate based on dosing criteria. Will send in refill to requested pharmacy.

## 2020-04-08 DIAGNOSIS — H4032X1 Glaucoma secondary to eye trauma, left eye, mild stage: Secondary | ICD-10-CM | POA: Diagnosis not present

## 2020-04-08 DIAGNOSIS — H40021 Open angle with borderline findings, high risk, right eye: Secondary | ICD-10-CM | POA: Diagnosis not present

## 2020-04-08 DIAGNOSIS — H40052 Ocular hypertension, left eye: Secondary | ICD-10-CM | POA: Diagnosis not present

## 2020-04-23 ENCOUNTER — Telehealth: Payer: Self-pay | Admitting: *Deleted

## 2020-04-23 NOTE — Telephone Encounter (Signed)
Schedule AWV.  

## 2020-04-24 ENCOUNTER — Telehealth: Payer: Self-pay | Admitting: *Deleted

## 2020-04-24 NOTE — Telephone Encounter (Signed)
Patient declined AWV 

## 2020-05-30 ENCOUNTER — Other Ambulatory Visit: Payer: Self-pay | Admitting: Endocrinology

## 2020-06-20 ENCOUNTER — Other Ambulatory Visit: Payer: Self-pay | Admitting: Endocrinology

## 2020-07-08 ENCOUNTER — Ambulatory Visit (INDEPENDENT_AMBULATORY_CARE_PROVIDER_SITE_OTHER): Payer: Medicare HMO | Admitting: Family Medicine

## 2020-07-08 ENCOUNTER — Other Ambulatory Visit: Payer: Self-pay

## 2020-07-08 ENCOUNTER — Encounter: Payer: Self-pay | Admitting: Family Medicine

## 2020-07-08 VITALS — BP 130/74 | HR 84 | Temp 98.6°F | Resp 15 | Ht 66.0 in | Wt 201.8 lb

## 2020-07-08 DIAGNOSIS — Z794 Long term (current) use of insulin: Secondary | ICD-10-CM

## 2020-07-08 DIAGNOSIS — Z1211 Encounter for screening for malignant neoplasm of colon: Secondary | ICD-10-CM

## 2020-07-08 DIAGNOSIS — E1165 Type 2 diabetes mellitus with hyperglycemia: Secondary | ICD-10-CM | POA: Diagnosis not present

## 2020-07-08 DIAGNOSIS — Z7901 Long term (current) use of anticoagulants: Secondary | ICD-10-CM

## 2020-07-08 DIAGNOSIS — R600 Localized edema: Secondary | ICD-10-CM | POA: Diagnosis not present

## 2020-07-08 NOTE — Progress Notes (Signed)
Subjective:  Patient ID: Matthew [name], male    DOB: 06/24/1945  Age: 75 y.o. MRN: [id number]  CC:  Chief Complaint  Patient presents with  . Diabetes    Pt had high microalbumin urine and wants this rechecked, unsure insurance status, pt reports takes Lasix ~1 monthly and wonders if taking this more frequently to help with  his urine.     HPI Matthew [name] presents for   Diabetes: History of uncontrolled diabetes, followed by endocrinology, Dr. Dwyane Dee.  Insulin-dependent 20-25 units NPH in evening, 15-17 units humalog with meals.  Would like to see different endocrinologist.  Microalbumin/creatinine ratio 5.1 with microalbumin 7.5 when checked in June 2021. He is on lisinopril 5 mg daily. Rarely uses lasix 40mg  for leg edema - prior once per month. Edema comes and goes. Noted more recently. Some weight gain over the holidays. Min added salt to food - watching salt intake.  Daily swelling in legs, resolves overnight no recent lasix No CP/dyspnea.  Hx of a flutter with eliquis for anticoag, metoprolol for rate control.  CABG in 2011 Last echo in 07/2016 - EF 55-60%. LVH, normal wall motion, grade 2 diastolic dysfunction. Mild MR, mild LAE.  Has not taken praluent yet.  Optho, foot exam, pneumovax: up to date.  Bright urine at times, no blood or dysuria. Borderline HGB in past - 12/6 in 11/2018.    Lab Results  Component Value Date   ALT 15 01/12/2019   AST 21 01/12/2019   ALKPHOS 76 01/12/2019   BILITOT 0.5 01/12/2019    Lab Results  Component Value Date   HGBA1C 8.2 (H) 12/21/2019   HGBA1C 8.2 (H) 01/12/2019   HGBA1C 8.1 (A) 03/10/2018   Lab Results  Component Value Date   MICROALBUR 7.5 (H) 10/11/2019   LDLCALC 126 (H) 12/21/2019   CREATININE 1.06 12/21/2019    History Patient Active Problem List   Diagnosis Date Noted  . Statin myopathy 09/15/2019  . Atrial flutter ([place]) 10/31/2014  . Coronary artery disease   . Paroxysmal atrial flutter ([place])    . Diabetes mellitus without complication ([place])   . Valvular heart disease   . Atrial fibrillation, unspecified 01/25/2014  . Diabetes mellitus, type 2 ([place]) 01/16/2014  . Hyperlipidemia 01/16/2014  . Erectile dysfunction associated with type 2 diabetes mellitus ([place]) 01/16/2014  . Coronary atherosclerosis of native coronary artery 06/30/2013  . Old myocardial infarction 06/30/2013  . Pure hypercholesterolemia 06/30/2013   Past Medical History:  Diagnosis Date  . Anemia   . Arthritis    "back, left ankle" (11/20/2015)  . Cataract   . CHF (congestive heart failure) ([place])   . Chronic lower back pain   . Coronary artery disease    a. 05/2009 CABG x 3: LIMA->LAD, VG->OM, VG->PDA; b. Nuc 01/2014: inf-lat scar but no ischemia, EF 57%.  . Diabetic retinopathy (HCC)    mild- Dr. Herbert Deaner  . Glaucoma   . Hyperlipidemia    a. Intolerant of lipitor and vytorin.  . Lyme disease   . Migraine    "once or twice" (11/20/2015)  . Myocardial infarction [facility])    'saw evidence of it on an EKG done in 2005"  . Paroxysmal atrial flutter ([place])    a. 01/2014 s/p DCCV;  b. CHA2DS2VASc = 3-->Eliquis.  . Pneumonia 1960  . Sacral pain    "right"  . Sciatica of right side   . Testicular cancer ([place]) 1982   "heavy doses of radiation;  it was stage 2"  . Type II diabetes mellitus ([place])   . Valvular heart disease    a. 01/2014 Echo: Ef 60-65%, no rwma, mild AI/MS, mod MR, mildly dil LA.   Past Surgical History:  Procedure Laterality Date  . ABDOMINAL EXPLORATION SURGERY  ~ 2005   "for hernia, but didn't have one"  . ATRIAL FIBRILLATION ABLATION N/A 09/17/2016   Procedure: Atrial Fibrillation Ablation;  Surgeon: [name], MD;  Location: [place] [facility];  Service: Cardiovascular;  Laterality: N/A;  . CARDIAC CATHETERIZATION  2011  . CARDIOVERSION N/A 02/19/2014   Procedure: CARDIOVERSION;  Surgeon: [name], MD;  Location: [facility] ENDOSCOPY;  Service: Cardiovascular;  Laterality: N/A;   . CARDIOVERSION N/A 10/28/2015   Procedure: CARDIOVERSION;  Surgeon: [name], MD;  Location: [facility] ENDOSCOPY;  Service: Cardiovascular;  Laterality: N/A;  . CARDIOVERSION  11/20/2015   "200 joules"  . CORONARY ARTERY BYPASS GRAFT  05/2009   LIMA-LAD, SVG-OM, SVG-PDA 06/20/09  . ELBOW FRACTURE SURGERY Left    broken ulna on left elbow-surgical repair  . ELECTROPHYSIOLOGIC STUDY N/A 10/31/2014   Procedure: A-Flutter;  Surgeon: [name], MD;  Location: [place] [facility];  Service: Cardiovascular;  Laterality: N/A;  . ELECTROPHYSIOLOGIC STUDY N/A 11/20/2015   Procedure: A-Flutter Ablation;  Surgeon: [name], MD;  Location: [place] CV LAB;  Service: Cardiovascular;  Laterality: N/A;  . EYE SURGERY    . FRACTURE SURGERY    . TESTICLE REMOVAL Right 1982  . TONSILLECTOMY  ~ 1967   Allergies  Allergen Reactions  . Actos [Pioglitazone] Swelling  . Bee Venom Swelling  . Codeine Hives and Nausea Only  . Statins Other (See Comments)    Muscle pain, fatigue  . Glucophage [Metformin Hcl] Nausea Only  . Raspberry Itching   Prior to Admission medications   Medication Sig Start Date End Date Taking? Authorizing Provider  BD VEO INSULIN SYRINGE U/F 31G X 15/64" 0.5 ML MISC USE TO INJECT INSULIN 3 TIMES A DAY. REFILLABLE ONLY WITH FOLLOW UP 03/04/20  Yes Elayne Snare, MD  ELIQUIS 5 MG TABS tablet TAKE 1 TABLET BY MOUTH TWICE A DAY 04/05/20  Yes Camnitz, Will Hassell Done, MD  insulin NPH Human (NOVOLIN N) 100 UNIT/ML injection INJECT 22 UNITS IN THE SKIN DAILY AT BEDTIME 03/11/20  Yes Elayne Snare, MD  lisinopril (ZESTRIL) 5 MG tablet TAKE 1 TABLET BY MOUTH EVERY DAY 02/09/20  Yes Camnitz, Will Hassell Done, MD  metoprolol tartrate (LOPRESSOR) 50 MG tablet TAKE 1/2 TABLET BY MOUTH TWICE A DAY 01/09/20  Yes Camnitz, Will Hassell Done, MD  NOVOLOG 100 UNIT/ML injection INJECT 16 UNITS INTO THE SKIN 3 (THREE) TIMES DAILY WITH MEALS. INJECT 15-16 UNITS IN THE SKIN 3 TIMES DAILY 06/20/20  Yes Elayne Snare, MD   [facility] VERIO test strip USE AS DIRECTED AS NEEDED 03/24/20  Yes Elayne Snare, MD  Alirocumab (PRALUENT) 75 MG/ML SOAJ Inject 75 mg into the skin every 14 (fourteen) days. Patient not taking: Reported on 07/08/2020 01/09/20   [name], MD  bimatoprost (LUMIGAN) 0.01 % SOLN Place 1 drop into the left eye at bedtime. Patient not taking: Reported on 07/08/2020    [provider]  neomycin-polymyxin-hydrocortisone (CORTISPORIN) OTIC solution Place 3 drops into the left ear 3 (three) times daily. Patient not taking: Reported on 07/08/2020 01/21/20   Hall-Potvin, [place], PA-C   Social History   Socioeconomic History  . Marital status: Married    Spouse name: Not  on file  . Number of children: Not on file  . Years of education: Not on file  . Highest education level: Not on file  Occupational History  . Not on file  Tobacco Use  . Smoking status: Former Smoker    Packs/day: 0.00    Years: 15.00    Pack years: 0.00    Types: Cigarettes    Quit date: 1990    Years since quitting: 32.2  . Smokeless tobacco: Never Used  . Tobacco comment: "quit smoking cigarettes in the 1980's; don't know how much or for how long"  Vaping Use  . Vaping Use: Never used  Substance and Sexual Activity  . Alcohol use: Yes    Alcohol/week: 3.0 standard drinks    Types: 3 Cans of beer per week  . Drug use: No    Comment: "quit smoking pot in the 1990s"  . Sexual activity: Not Currently    Birth control/protection: Coitus interruptus  Other Topics Concern  . Not on file  Social History Narrative  . Not on file   Social Determinants of Health   Financial Resource Strain: Not on file  Food Insecurity: Not on file  Transportation Needs: Not on file  Physical Activity: Not on file  Stress: Not on file  Social Connections: Not on file  Intimate Partner Violence: Not on file    Review of Systems Per HPi.   Objective:   Vitals:   07/08/20 1047 07/08/20 1102  BP: (!) 166/77  130/74  Pulse: 84   Resp: 15   Temp: 98.6 F (37 C)   TempSrc: Temporal   SpO2: 97%   Weight: 201 lb 12.8 oz (91.5 kg)   Height: 5\' 6"  (1.676 m)      Physical Exam Vitals reviewed.  Constitutional:      Appearance: He is well-developed.  HENT:     Head: Normocephalic and atraumatic.  Eyes:     Pupils: Pupils are equal, round, and reactive to light.  Neck:     Vascular: No carotid bruit or JVD.  Cardiovascular:     Rate and Rhythm: Normal rate and regular rhythm.     Heart sounds: Normal heart sounds. No murmur heard.   Pulmonary:     Effort: Pulmonary effort is normal.     Breath sounds: Normal breath sounds. No rales.  Musculoskeletal:     Right lower leg: Edema (2+ lower third legs bilat, no stasis change or wound. ) present.     Left lower leg: Edema present.  Skin:    General: Skin is warm and dry.  Neurological:     Mental Status: He is alert and oriented to person, place, and time.        Assessment & Plan:  Matthew [name] is a 75 y.o. male . Pedal edema  -Avoidance of high sodium foods, commended on his efforts.  Handout given on salty 6.  Episodic Lasix if needed but recommended cardiology follow-up if more persistent pedal edema.  Type 2 diabetes mellitus with hyperglycemia, with long-term current use of insulin (HCC) - Plan: Hemoglobin A1c, Lipid panel, Comprehensive metabolic panel, Microalbumin / creatinine urine ratio, Ambulatory referral to Endocrinology  -Check A1c, requests new neurologist, referral placed.  Repeat urine microalbumin/creatinine ratio.  -Previous LFTs normal, with bright urine I will check CMP again today.  Chronic anticoagulation - Plan: CBC  Screen for colon cancer - Plan: Ambulatory referral to Gastroenterology   No orders of the defined types were  placed in this encounter.  Patient Instructions    Try compression stockings to help with leg swelling. See info on high sodium foods and avoid those as possible. Lasix  1/2-1 as needed for swelling, but if you are swelling more, I do want you to see your cardiologist to discuss further.   I will refer you to new endocrinologist and check some labs, as well as gastroenterology referral for colonoscopy.   Return to the clinic or go to the nearest emergency room if any of your symptoms worsen or new symptoms occur.   Peripheral Edema  Peripheral edema is swelling that is caused by a buildup of fluid. Peripheral edema most often affects the lower legs, ankles, and feet. It can also develop in the arms, hands, and face. The area of the body that has peripheral edema will look swollen. It may also feel heavy or warm. Your clothes may start to feel tight. Pressing on the area may make a temporary dent in your skin. You may not be able to move your swollen arm or leg as much as usual. There are many causes of peripheral edema. It can happen because of a complication of other conditions such as congestive heart failure, kidney disease, or a problem with your blood circulation. It also can be a side effect of certain medicines or because of an infection. It often happens to women during pregnancy. Sometimes, the cause is not known. Follow these instructions at home: Managing pain, stiffness, and swelling  Raise (elevate) your legs while you are sitting or lying down.  Move around often to prevent stiffness and to lessen swelling.  Do not sit or stand for long periods of time.  Wear support stockings as told by your health care provider.   Medicines  Take over-the-counter and prescription medicines only as told by your health care provider.  Your health care provider may prescribe medicine to help your body get rid of excess water (diuretic). General instructions  Pay attention to any changes in your symptoms.  Follow instructions from your health care provider about limiting salt (sodium) in your diet. Sometimes, eating less salt may reduce  swelling.  Moisturize skin daily to help prevent skin from cracking and draining.  Keep all follow-up visits as told by your health care provider. This is important. Contact a health care provider if you have:  A fever.  Edema that starts suddenly or is getting worse, especially if you are pregnant or have a medical condition.  Swelling in only one leg.  Increased swelling, redness, or pain in one or both of your legs.  Drainage or sores at the area where you have edema. Get help right away if you:  Develop shortness of breath, especially when you are lying down.  Have pain in your chest or abdomen.  Feel weak.  Feel faint. Summary  Peripheral edema is swelling that is caused by a buildup of fluid. Peripheral edema most often affects the lower legs, ankles, and feet.  Move around often to prevent stiffness and to lessen swelling. Do not sit or stand for long periods of time.  Pay attention to any changes in your symptoms.  Contact a health care provider if you have edema that starts suddenly or is getting worse, especially if you are pregnant or have a medical condition.  Get help right away if you develop shortness of breath, especially when lying down. This information is not intended to replace advice given to you by your health  care provider. Make sure you discuss any questions you have with your health care provider. Document Revised: 01/05/2018 Document Reviewed: 01/05/2018 Elsevier Patient Education  2021 [employer].      If you have lab work done today you will be contacted with your lab results within the next 2 weeks.  If you have not heard from [place] then please contact us. The fastest way to get your results is to register for My Chart.   IF you received an x-ray today, you will receive an invoice from [facility] Rad[facility]. Please contact [facility] at [phone] with questions or concerns regarding your invoice.   IF you received labwork  today, you will receive an invoice from [place]. Please contact [facility] at [phone] with questions or concerns regarding your invoice.   Our billing staff will not be able to assist you with questions regarding bills from these companies.  You will be contacted with the lab results as soon as they are available. The fastest way to get your results is to activate your My Chart account. Instructions are located on the last page of this paperwork. If you have not heard from [place] regarding the results in 2 weeks, please contact this office.         Signed, Merri Ray, MD [facility]

## 2020-07-08 NOTE — Patient Instructions (Addendum)
Try compression stockings to help with leg swelling. See info on high sodium foods and avoid those as possible. Lasix 1/2-1 as needed for swelling, but if you are swelling more, I do want you to see your cardiologist to discuss further.   I will refer you to new endocrinologist and check some labs, as well as gastroenterology referral for colonoscopy.   Return to the clinic or go to the nearest emergency room if any of your symptoms worsen or new symptoms occur.   Peripheral Edema  Peripheral edema is swelling that is caused by a buildup of fluid. Peripheral edema most often affects the lower legs, ankles, and feet. It can also develop in the arms, hands, and face. The area of the body that has peripheral edema will look swollen. It may also feel heavy or warm. Your clothes may start to feel tight. Pressing on the area may make a temporary dent in your skin. You may not be able to move your swollen arm or leg as much as usual. There are many causes of peripheral edema. It can happen because of a complication of other conditions such as congestive heart failure, kidney disease, or a problem with your blood circulation. It also can be a side effect of certain medicines or because of an infection. It often happens to women during pregnancy. Sometimes, the cause is not known. Follow these instructions at home: Managing pain, stiffness, and swelling  Raise (elevate) your legs while you are sitting or lying down.  Move around often to prevent stiffness and to lessen swelling.  Do not sit or stand for long periods of time.  Wear support stockings as told by your health care provider.   Medicines  Take over-the-counter and prescription medicines only as told by your health care provider.  Your health care provider may prescribe medicine to help your body get rid of excess water (diuretic). General instructions  Pay attention to any changes in your symptoms.  Follow instructions from your  health care provider about limiting salt (sodium) in your diet. Sometimes, eating less salt may reduce swelling.  Moisturize skin daily to help prevent skin from cracking and draining.  Keep all follow-up visits as told by your health care provider. This is important. Contact a health care provider if you have:  A fever.  Edema that starts suddenly or is getting worse, especially if you are pregnant or have a medical condition.  Swelling in only one leg.  Increased swelling, redness, or pain in one or both of your legs.  Drainage or sores at the area where you have edema. Get help right away if you:  Develop shortness of breath, especially when you are lying down.  Have pain in your chest or abdomen.  Feel weak.  Feel faint. Summary  Peripheral edema is swelling that is caused by a buildup of fluid. Peripheral edema most often affects the lower legs, ankles, and feet.  Move around often to prevent stiffness and to lessen swelling. Do not sit or stand for long periods of time.  Pay attention to any changes in your symptoms.  Contact a health care provider if you have edema that starts suddenly or is getting worse, especially if you are pregnant or have a medical condition.  Get help right away if you develop shortness of breath, especially when lying down. This information is not intended to replace advice given to you by your health care provider. Make sure you discuss any questions you have with  your health care provider. Document Revised: 01/05/2018 Document Reviewed: 01/05/2018 Elsevier Patient Education  2021 [employer].      If you have lab work done today you will be contacted with your lab results within the next 2 weeks.  If you have not heard from [place] then please contact us. The fastest way to get your results is to register for My Chart.   IF you received an x-ray today, you will receive an invoice from [facility] Radiology. Please contact [facility]  Radiology at [phone] with questions or concerns regarding your invoice.   IF you received labwork today, you will receive an invoice from [place]. Please contact [facility] at [phone] with questions or concerns regarding your invoice.   Our billing staff will not be able to assist you with questions regarding bills from these companies.  You will be contacted with the lab results as soon as they are available. The fastest way to get your results is to activate your My Chart account. Instructions are located on the last page of this paperwork. If you have not heard from [place] regarding the results in 2 weeks, please contact this office.

## 2020-07-09 ENCOUNTER — Telehealth: Payer: Self-pay | Admitting: Pharmacist

## 2020-07-09 DIAGNOSIS — G729 Myopathy, unspecified: Secondary | ICD-10-CM

## 2020-07-09 LAB — CBC
Hematocrit: 38.6 % (ref 37.5–51.0)
Hemoglobin: 12.8 g/dL — ABNORMAL LOW (ref 13.0–17.7)
MCH: 29.1 pg (ref 26.6–33.0)
MCHC: 33.2 g/dL (ref 31.5–35.7)
MCV: 88 fL (ref 79–97)
Platelets: 274 10*3/uL (ref 150–450)
RBC: 4.4 x10E6/uL (ref 4.14–5.80)
RDW: 13.5 % (ref 11.6–15.4)
WBC: 7.2 10*3/uL (ref 3.4–10.8)

## 2020-07-09 LAB — LIPID PANEL
Chol/HDL Ratio: 4.6 ratio (ref 0.0–5.0)
Cholesterol, Total: 230 mg/dL — ABNORMAL HIGH (ref 100–199)
HDL: 50 mg/dL (ref >39)
LDL Chol Calc (NIH): 164 mg/dL — ABNORMAL HIGH (ref 0–99)
Triglycerides: 89 mg/dL (ref 0–149)
VLDL Cholesterol Cal: 16 mg/dL (ref 5–40)

## 2020-07-09 LAB — COMPREHENSIVE METABOLIC PANEL
ALT: 15 IU/L (ref 0–44)
AST: 18 IU/L (ref 0–40)
Albumin/Globulin Ratio: 1.4 (ref 1.2–2.2)
Albumin: 4 g/dL (ref 3.7–4.7)
Alkaline Phosphatase: 102 IU/L (ref 44–121)
BUN/Creatinine Ratio: 17 (ref 10–24)
BUN: 19 mg/dL (ref 8–27)
Bilirubin Total: 0.2 mg/dL (ref 0.0–1.2)
CO2: 24 mmol/L (ref 20–29)
Calcium: 8.8 mg/dL (ref 8.6–10.2)
Chloride: 100 mmol/L (ref 96–106)
Creatinine, Ser: 1.1 mg/dL (ref 0.76–1.27)
Globulin, Total: 2.8 g/dL (ref 1.5–4.5)
Glucose: 239 mg/dL — ABNORMAL HIGH (ref 65–99)
Potassium: 4.9 mmol/L (ref 3.5–5.2)
Sodium: 138 mmol/L (ref 134–144)
Total Protein: 6.8 g/dL (ref 6.0–8.5)
eGFR: 71 mL/min/{1.73_m2} (ref >59)

## 2020-07-09 LAB — MICROALBUMIN / CREATININE URINE RATIO
Creatinine, Urine: 52.3 mg/dL
Microalb/Creat Ratio: 89 mg/g creat — ABNORMAL HIGH (ref 0–29)
Microalbumin, Urine: 46.7 ug/mL

## 2020-07-09 LAB — HEMOGLOBIN A1C
Est. average glucose Bld gHb Est-mCnc: 206 mg/dL
Hgb A1c MFr Bld: 8.8 % — ABNORMAL HIGH (ref 4.8–5.6)

## 2020-07-09 NOTE — Telephone Encounter (Signed)
Left a HIPAA compliant voicemail requesting patient to call [facility] to discuss starting Praluent for hyperlipidemia management.

## 2020-07-11 ENCOUNTER — Other Ambulatory Visit: Payer: Self-pay | Admitting: Endocrinology

## 2020-07-11 DIAGNOSIS — E1165 Type 2 diabetes mellitus with hyperglycemia: Secondary | ICD-10-CM

## 2020-07-11 DIAGNOSIS — Z794 Long term (current) use of insulin: Secondary | ICD-10-CM

## 2020-07-15 NOTE — Telephone Encounter (Signed)
Matthew [name] is returning Matthew [name]'s call. He states he has not started the Praluent yet and hasn't decided if he is going to take it or not. He went to pick up his second prescription of this and the pharmacy advised him it would cost $364.  He did not pick up the prescription due to this and plans to speak with his insurance in regards to it. Please advise.

## 2020-07-16 NOTE — Telephone Encounter (Signed)
lmomed the pt to call back so we can discuss issues w/praluent and possibly get him signed up for the [employer]

## 2020-07-17 ENCOUNTER — Telehealth: Payer: Self-pay | Admitting: *Deleted

## 2020-07-17 NOTE — Telephone Encounter (Signed)
   Primary Cardiologist: Will Meredith Leeds, MD  Chart reviewed as part of pre-operative protocol coverage. Given past medical history and time since last visit, based on ACC/AHA guidelines, Matthew [name] would be at acceptable risk for the planned procedure without further cardiovascular testing.   Patient with diagnosis of afib on Eliquis for anticoagulation.    Procedure: colonoscopy Date of procedure: 08/29/20   CHA2DS2-VASc Score = 5  This indicates a 7.2% annual risk of stroke. The patient's score is based upon: CHF History: Yes HTN History: Yes (Not documented on PMH but BP consistently elevated at office visits) Diabetes History: Yes Stroke History: No Vascular Disease History: Yes Age Score: 1 Gender Score: 0  CrCl 64 mL/min using adjusted body weight Platelet count 274K  Per office protocol, patient can hold Eliquis for 1-2 days prior to procedure.    Patient should restart Eliquis on the evening of procedure or day after, at discretion of procedure MD  Patient was advised that if he develops new symptoms prior to surgery to contact our office to arrange a follow-up appointment.  He verbalized understanding.  I will route this recommendation to the requesting party via Epic fax function and remove from pre-op pool.  Please call with questions.  Jossie Ng. Adhrit Krenz NP-C    07/17/2020, 11:30 AM [name] [address] Office [phone] Fax [phone]

## 2020-07-17 NOTE — Telephone Encounter (Signed)
   [facility] Pre-operative Risk Assessment    [facility] STAFF: - Please ensure there is not already an duplicate clearance open for this procedure. - Under Visit Info/Reason for Call, type in Other and utilize the format Clearance MM/DD/YY or Clearance TBD. Do not use dashes or single digits. - If request is for dental extraction, please clarify the # of teeth to be extracted.  Request for surgical clearance:  1. What type of surgery is being performed? COLONOSCOPY   2. When is this surgery scheduled? 08/29/20   3. What type of clearance is required (medical clearance vs. Pharmacy clearance to hold med vs. Both)? BOTH  4. Are there any medications that need to be held prior to surgery and how long? ELIQUIS   5. Practice name and name of physician performing surgery? [facility]; DR. Michail Sermon   6. What is the office phone number? [phone]   7.   What is the office fax number? [phone]  8.   Anesthesia type (None, local, MAC, general) ? PROPOFOL   Julaine Hua 07/17/2020, 10:33 AM  _________________________________________________________________   (provider comments below)

## 2020-07-17 NOTE — Telephone Encounter (Signed)
Patient with diagnosis of afib on Eliquis for anticoagulation.    Procedure: colonoscopy Date of procedure: 08/29/20   CHA2DS2-VASc Score = 5  This indicates a 7.2% annual risk of stroke. The patient's score is based upon: CHF History: Yes HTN History: Yes (Not documented on PMH but BP consistently elevated at office visits) Diabetes History: Yes Stroke History: No Vascular Disease History: Yes Age Score: 1 Gender Score: 0  CrCl 64 mL/min using adjusted body weight Platelet count 274K  Per office protocol, patient can hold Eliquis for 1-2 days prior to procedure.    Patient should restart Eliquis on the evening of procedure or day after, at discretion of procedure MD

## 2020-07-25 DIAGNOSIS — Z789 Other specified health status: Secondary | ICD-10-CM

## 2020-07-25 NOTE — Progress Notes (Addendum)
[name])                                            [place] Team                                        Statin Quality Measure Assessment    07/25/2020  Matthew [name] 11-Jan-1946 [id number]  Per review of chart and payor information, this patient has been flagged for non-adherence to the following CMS Quality Measure:   [x]  Statin Use in Persons with Diabetes  [x]  Statin Use in Persons with Cardiovascular Disease  The ASCVD Risk score [name], et al., 2013) failed to calculate for the following reasons:   The patient has a prior MI or stroke diagnosis   Currently prescribed statin:  []  Yes [x]  No     Comments: Patient is currently prescribed Praluent, but per o/v note in March 2022 w/ PCP, he is not currently taking this medication. Per recent patient outreach note, the cost of the Parluent is $364 and the cardiology office is working to setup [employer] for this patient.   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  Currently, the patient does not have any appointments with PCP or cardiology.   Please consider ONE of the following, highlighted in blue, recommendations at the next office visit:   Initiate high intensity statin Atorvastatin 40mg  once daily, #90, 3 refills   Rosuvastatin 20mg  once daily, #90, 3 refills    Initiate moderate intensity          statin with reduced frequency if prior          statin intolerance 1x weekly, #13, 3 refills   2x weekly, #26, 3 refills   3x weekly, #39, 3 refills    Code for past statin intolerance or other exclusions (required annually)  Drug Induced Myopathy G72.0   Myositis, unspecified M60.9   Rhabdomyolysis M62.82   Prediabetes R73.03   Adverse effect of antihyperlipidemic and antiarteriosclerotic  drugs, initial encounter X90.2I0X    Thank you for your time,  [name], [place] Cell: [phone]

## 2020-08-15 ENCOUNTER — Other Ambulatory Visit: Payer: Self-pay | Admitting: Endocrinology

## 2020-08-15 DIAGNOSIS — E1165 Type 2 diabetes mellitus with hyperglycemia: Secondary | ICD-10-CM

## 2020-08-15 DIAGNOSIS — Z794 Long term (current) use of insulin: Secondary | ICD-10-CM

## 2020-09-07 ENCOUNTER — Other Ambulatory Visit: Payer: Self-pay | Admitting: Cardiology

## 2020-09-07 DIAGNOSIS — I483 Typical atrial flutter: Secondary | ICD-10-CM

## 2020-09-10 ENCOUNTER — Other Ambulatory Visit: Payer: Self-pay | Admitting: Endocrinology

## 2020-09-10 DIAGNOSIS — E1165 Type 2 diabetes mellitus with hyperglycemia: Secondary | ICD-10-CM

## 2020-09-10 DIAGNOSIS — Z794 Long term (current) use of insulin: Secondary | ICD-10-CM

## 2020-09-24 DIAGNOSIS — I788 Other diseases of capillaries: Secondary | ICD-10-CM | POA: Diagnosis not present

## 2020-09-26 ENCOUNTER — Other Ambulatory Visit: Payer: Self-pay | Admitting: Endocrinology

## 2020-09-26 ENCOUNTER — Telehealth: Payer: Self-pay | Admitting: Endocrinology

## 2020-09-26 DIAGNOSIS — Z794 Long term (current) use of insulin: Secondary | ICD-10-CM

## 2020-09-26 DIAGNOSIS — E1165 Type 2 diabetes mellitus with hyperglycemia: Secondary | ICD-10-CM

## 2020-09-26 MED ORDER — INSULIN ASPART 100 UNIT/ML IJ SOLN: INTRAMUSCULAR | 0 refills | Status: DC

## 2020-09-26 NOTE — Telephone Encounter (Signed)
MEDICATION: NOVOLOG 100 UNIT/ML injection  PHARMACY:   [id number] - [place], [place] - [address] AT [place] Phone:  [phone]  Fax:  [phone]       HAS THE PATIENT CONTACTED THEIR PHARMACY? Yes-PHARM told Patient request was denied   IS THIS A 90 DAY SUPPLY : If possible  IS PATIENT OUT OF MEDICATION: No-Patient leaving town and will be on the road the month of June and will be out soon  IF NOT; HOW MUCH IS LEFT: 2/3 of a vial  LAST APPOINTMENT DATE: @68 /30/2021  NEXT APPOINTMENT DATE:@7 /08/2020  DO WE HAVE YOUR PERMISSION TO LEAVE A DETAILED MESSAGE?: Yes  OTHER COMMENTS:    **Let patient know to contact pharmacy at the end of the day to make sure medication is ready. **  ** Please notify patient to allow 48-72 hours to process**  **Encourage patient to contact the pharmacy for refills or they can request refills through [facility]**

## 2020-09-26 NOTE — Addendum Note (Signed)
Addended by: Jacqualin Combes on: 09/26/2020 02:42 PM   Modules accepted: Orders

## 2020-10-01 ENCOUNTER — Other Ambulatory Visit: Payer: Self-pay | Admitting: Endocrinology

## 2020-10-22 ENCOUNTER — Other Ambulatory Visit: Payer: Self-pay | Admitting: Endocrinology

## 2020-10-29 ENCOUNTER — Other Ambulatory Visit: Payer: Self-pay | Admitting: Endocrinology

## 2020-10-29 ENCOUNTER — Other Ambulatory Visit (INDEPENDENT_AMBULATORY_CARE_PROVIDER_SITE_OTHER): Payer: Medicare HMO

## 2020-10-29 ENCOUNTER — Other Ambulatory Visit: Payer: Self-pay

## 2020-10-29 DIAGNOSIS — E1165 Type 2 diabetes mellitus with hyperglycemia: Secondary | ICD-10-CM

## 2020-10-29 DIAGNOSIS — Z794 Long term (current) use of insulin: Secondary | ICD-10-CM

## 2020-10-29 LAB — MICROALBUMIN / CREATININE URINE RATIO
Creatinine,U: 156.2 mg/dL
Microalb Creat Ratio: 4.5 mg/g (ref 0.0–30.0)
Microalb, Ur: 7 mg/dL — ABNORMAL HIGH (ref 0.0–1.9)

## 2020-10-29 LAB — URINALYSIS, ROUTINE W REFLEX MICROSCOPIC
Bilirubin Urine: NEGATIVE
Hgb urine dipstick: NEGATIVE
Ketones, ur: NEGATIVE
Leukocytes,Ua: NEGATIVE
Nitrite: NEGATIVE
RBC / HPF: NONE SEEN (ref 0–?)
Specific Gravity, Urine: 1.02 (ref 1.000–1.030)
Total Protein, Urine: NEGATIVE
Urine Glucose: NEGATIVE
Urobilinogen, UA: 0.2 (ref 0.0–1.0)
pH: 6.5 (ref 5.0–8.0)

## 2020-10-29 LAB — BASIC METABOLIC PANEL
BUN: 13 mg/dL (ref 6–23)
CO2: 28 mEq/L (ref 19–32)
Calcium: 9 mg/dL (ref 8.4–10.5)
Chloride: 102 mEq/L (ref 96–112)
Creatinine, Ser: 0.99 mg/dL (ref 0.40–1.50)
GFR: 76.1 mL/min (ref >60.00)
Glucose, Bld: 145 mg/dL — ABNORMAL HIGH (ref 70–99)
Potassium: 4.3 mEq/L (ref 3.5–5.1)
Sodium: 137 mEq/L (ref 135–145)

## 2020-10-29 LAB — HEMOGLOBIN A1C: Hgb A1c MFr Bld: 8.3 % — ABNORMAL HIGH (ref 4.6–6.5)

## 2020-10-31 ENCOUNTER — Encounter: Payer: Self-pay | Admitting: Endocrinology

## 2020-10-31 ENCOUNTER — Ambulatory Visit (INDEPENDENT_AMBULATORY_CARE_PROVIDER_SITE_OTHER): Payer: Medicare HMO | Admitting: Endocrinology

## 2020-10-31 ENCOUNTER — Other Ambulatory Visit: Payer: Self-pay

## 2020-10-31 VITALS — BP 152/80 | HR 64 | Ht 66.0 in | Wt 191.8 lb

## 2020-10-31 DIAGNOSIS — M25473 Effusion, unspecified ankle: Secondary | ICD-10-CM | POA: Diagnosis not present

## 2020-10-31 DIAGNOSIS — E1165 Type 2 diabetes mellitus with hyperglycemia: Secondary | ICD-10-CM | POA: Diagnosis not present

## 2020-10-31 DIAGNOSIS — Z794 Long term (current) use of insulin: Secondary | ICD-10-CM

## 2020-10-31 NOTE — Patient Instructions (Addendum)
Take steady dose of 20 N at bedtime and adjust based on am sugar only  More sugars at bedtime  Check blood sugars on waking up days a week  Also check blood sugars about 2 hours after meals and do this after different meals by rotation  Recommended blood sugar levels on waking up are 90-130 and about 2 hours after meal is 130-180  Please bring your blood sugar monitor to each visit, thank you  Glucose gel  Take 2-3 U less at lunch

## 2020-10-31 NOTE — Progress Notes (Signed)
Patient ID: Matthew [name], male   DOB: 1946-04-09, 75 y.o.   MRN: [id number]           Reason for Appointment: Followup for Type 2 Diabetes    History of Present Illness:          Diagnosis: Type 2 diabetes mellitus, date of diagnosis:  1992      Past history: His blood sugar was high at diagnosis when he was having a routine screening done. He was started on Glucophage initially which he took for about a year. He thinks it did not help his sugar much and he did not feel good with it Apparently he was trying to control his diabetes with diet and exercise for a few years. He may have tried Glucophage again before going on insulin. Also was started on Actos which caused swelling He has been treated mostly with premixed insulin for about 15 years and his level of control appears to be inadequate although details of previous treatment are not available. He may have taken Humalog at one time for use with high sugars A1c was 11.5 in 2011 On his initial consultation in 9/15 because of poor control with premixed insulin he was switched to Levemir twice a day and Humalog with meals.  Recent history:   INSULIN regimen is described as:20-22 N at 10pm ; Humalog 15-16 units with meals     Current management, blood sugar patterns, current level of control and problems:  His A1c is still over 8% and now 8.3  He has not been seen in follow-up for almost a year now He was recommended a trial of Ozempic to help with postprandial readings but he did not try this However he appears to be cutting back on his calorie intake and is losing weight recently Taking NPH at bedtime However he says he is adjusting this based on how much he is eating Most of his blood sugars are in the morning only at variable times POSTPRANDIAL readings are not checked. His blood sugars are inconsistent before dinnertime but tend to be normal or low more often than high from the data available He did have an episode of  severe hypoglycemia overnight and his wife had to call the EMS although apparently they just gave him glucose gel without doing an IV injection, details not available  He will sometimes will take his Humalog right after eating instead of before He says sometimes he will not eat a full meal at lunch but will still take 6 units of insulin, otherwise takes at least 10 to 15 units with his meals Not clear if he is adjusting much based on his meal size and carbohydrates at dinnertime He has been reluctant to go on on insulin pump Recommended the freestyle libre sensor on the last visit but he did not pursue this Has not been able to exercise because of back pain    With hypoglycemia he has symptoms of weakness, sweating, shakiness and confusion Treating low sugars with orange juice       Oral hypoglycemic drugs the patient is taking are: None       Side effects from medications have been: Edema from Actos,? Nausea and malaise from metformin, polyuria from [place]  Compliance with the medical regimen: Fair  Glucose monitoring:  done less than one time a day         Glucometer: One Touch.      Blood Glucose readings from download:    PRE-MEAL Fasting  Lunch Dinner Overnight Overall  Glucose range: 52-330  44-323 47, 188   Mean/median: 170    155   POST-MEAL PC Breakfast PC Lunch PC Dinner  Glucose range:   ?  Mean/median:      Previously  PRE-MEAL Fasting Lunch Dinner Bedtime Overall  Glucose range:  70-285  43, 73  49-225    /median:  129   82   114   POST-MEAL PC Breakfast PC Lunch PC Dinner  Glucose range:   ?  Mean/median:         Self-care:   Meals: 2-3 meals per day.  Bfst 8:30-9, 1 pm Dinner is about 8 PM.  Some meals are high fat        Dietician visit, most recent:?  15 years ago.      CDE visit: 3/16            Weight history:  Wt Readings from Last 3 Encounters:  10/31/20 191 lb 12.8 oz (87 kg)  07/08/20 201 lb 12.8 oz (91.5 kg)  12/25/19 200 lb 6.4 oz  (90.9 kg)    Glycemic control:   Lab Results  Component Value Date   HGBA1C 8.3 (H) 10/29/2020   HGBA1C 8.8 (H) 07/08/2020   HGBA1C 8.2 (H) 12/21/2019   Lab Results  Component Value Date   MICROALBUR 7.0 (H) 10/29/2020   LDLCALC 164 (H) 07/08/2020   CREATININE 0.99 10/29/2020    Lab on 10/29/2020  Component Date Value Ref Range Status   Color, Urine 10/29/2020 YELLOW  Yellow;Lt. Yellow;Straw;Dark Yellow;Amber;Green;Red;Brown Final   APPearance 10/29/2020 CLEAR  Clear;Turbid;Slightly Cloudy;Cloudy Final   Specific Gravity, Urine 10/29/2020 1.020  1.000 - 1.030 Final   pH 10/29/2020 6.5  5.0 - 8.0 Final   Total Protein, Urine 10/29/2020 NEGATIVE  Negative Final   Urine Glucose 10/29/2020 NEGATIVE  Negative Final   Ketones, ur 10/29/2020 NEGATIVE  Negative Final   Bilirubin Urine 10/29/2020 NEGATIVE  Negative Final   Hgb urine dipstick 10/29/2020 NEGATIVE  Negative Final   Urobilinogen, UA 10/29/2020 0.2  0.0 - 1.0 Final   Leukocytes,Ua 10/29/2020 NEGATIVE  Negative Final   Nitrite 10/29/2020 NEGATIVE  Negative Final   WBC, UA 10/29/2020 0-2/hpf  0-2/hpf Final   RBC / HPF 10/29/2020 none seen  0-2/hpf Final   Mucus, UA 10/29/2020 Presence of (A) None Final   Squamous Epithelial / LPF 10/29/2020 Rare(0-4/hpf)  Rare(0-4/hpf) Final   Microalb, Ur 10/29/2020 7.0 (A) 0.0 - 1.9 mg/dL Final   Creatinine,U 10/29/2020 156.2  mg/dL Final   Microalb Creat Ratio 10/29/2020 4.5  0.0 - 30.0 mg/g Final   Sodium 10/29/2020 137  135 - 145 mEq/L Final   Potassium 10/29/2020 4.3  3.5 - 5.1 mEq/L Final   Chloride 10/29/2020 102  96 - 112 mEq/L Final   CO2 10/29/2020 28  19 - 32 mEq/L Final   Glucose, Bld 10/29/2020 145 (A) 70 - 99 mg/dL Final   BUN 10/29/2020 13  6 - 23 mg/dL Final   Creatinine, Ser 10/29/2020 0.99  0.40 - 1.50 mg/dL Final   GFR 10/29/2020 76.10  >60.00 mL/min Final   Calculated using the CKD-EPI Creatinine Equation (2021)   Calcium 10/29/2020 9.0  8.4 - 10.5 mg/dL Final    Hgb A1c MFr Bld 10/29/2020 8.3 (A) 4.6 - 6.5 % Final   Glycemic Control Guidelines for People with Diabetes:Non Diabetic:  <6%Goal of Therapy: <7%Additional Action Suggested:  >8%       Allergies as of 10/31/2020  Reactions   Actos [pioglitazone] Swelling   Bee Venom Swelling   Codeine Hives, Nausea Only   Statins Other (See Comments)   Muscle pain, fatigue   Glucophage [metformin Hcl] Nausea Only   Raspberry Itching        Medication List        Accurate as of October 31, 2020  9:44 AM. If you have any questions, ask your nurse or doctor.          BD Veo Insulin Syringe U/F 31G X 15/64" 0.5 ML Misc Generic drug: Insulin Syringe-Needle U-100 USE TO INJECT INSULIN 3 TIMES A DAY. REFILLABLE ONLY WITH FOLLOW UP   bimatoprost 0.01 % Soln Commonly known as: LUMIGAN Place 1 drop into the left eye at bedtime.   Eliquis 5 MG Tabs tablet Generic drug: apixaban TAKE 1 TABLET BY MOUTH TWICE A DAY   insulin NPH Human 100 UNIT/ML injection Commonly known as: NovoLIN N INJECT 22 UNITS IN THE SKIN DAILY AT BEDTIME. NO FURTHER REFILLS UNTIL SEEN IN THE OFFICE   lisinopril 5 MG tablet Commonly known as: ZESTRIL TAKE 1 TABLET BY MOUTH EVERY DAY   metoprolol tartrate 50 MG tablet Commonly known as: LOPRESSOR Take 0.5 tablets (25 mg total) by mouth 2 (two) times daily. Please make yearly appt with Dr. Curt Bears for [place] 2022 for future refills. Thank you 1st attempt   neomycin-polymyxin-hydrocortisone OTIC solution Commonly known as: CORTISPORIN Place 3 drops into the left ear 3 (three) times daily.   NovoLOG 100 UNIT/ML injection Generic drug: insulin aspart INJECT 15-16 UNITS IN THE SKIN 3 TIMES DAILY   OneTouch Verio test strip Generic drug: glucose blood USE AS DIRECTED AS NEEDED   Praluent 75 MG/ML Soaj Generic drug: Alirocumab Inject 75 mg into the skin every 14 (fourteen) days.        Allergies:  Allergies  Allergen Reactions   Actos [Pioglitazone]  Swelling   Bee Venom Swelling   Codeine Hives and Nausea Only   Statins Other (See Comments)    Muscle pain, fatigue   Glucophage [Metformin Hcl] Nausea Only   Raspberry Itching    Past Medical History:  Diagnosis Date   Anemia    Arthritis    "back, left ankle" (11/20/2015)   Cataract    CHF (congestive heart failure) (HCC)    Chronic lower back pain    Coronary artery disease    a. 05/2009 CABG x 3: LIMA->LAD, VG->OM, VG->PDA; b. Nuc 01/2014: inf-lat scar but no ischemia, EF 57%.   Diabetic retinopathy (HCC)    mild- Dr. Herbert Deaner   Glaucoma    Hyperlipidemia    a. Intolerant of lipitor and vytorin.   Lyme disease    Migraine    "once or twice" (11/20/2015)   Myocardial infarction [facility])    'saw evidence of it on an EKG done in 2005"   Paroxysmal atrial flutter ([place])    a. 01/2014 s/p DCCV;  b. CHA2DS2VASc = 3-->Eliquis.   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 01/2014 Echo: Ef 60-65%, no rwma, mild AI/MS, mod MR, mildly dil LA.    Past Surgical History:  Procedure Laterality Date   ABDOMINAL EXPLORATION SURGERY  ~ 2005   "for hernia, but didn't have one"  ATRIAL FIBRILLATION ABLATION N/A 09/17/2016   Procedure: Atrial Fibrillation Ablation;  Surgeon: [name], MD;  Location: [place] [facility];  Service: Cardiovascular;  Laterality: N/A;   CARDIAC CATHETERIZATION  2011   CARDIOVERSION N/A 02/19/2014   Procedure: CARDIOVERSION;  Surgeon: [name], MD;  Location: [facility];  Service: Cardiovascular;  Laterality: N/A;   CARDIOVERSION N/A 10/28/2015   Procedure: CARDIOVERSION;  Surgeon: [name], MD;  Location: [facility];  Service: Cardiovascular;  Laterality: N/A;   CARDIOVERSION  11/20/2015   "200 joules"   CORONARY ARTERY BYPASS GRAFT  05/2009   LIMA-LAD, SVG-OM,  SVG-PDA 06/20/09   ELBOW FRACTURE SURGERY Left    broken ulna on left elbow-surgical repair   ELECTROPHYSIOLOGIC STUDY N/A 10/31/2014   Procedure: A-Flutter;  Surgeon: [name], MD;  Location: [place] [facility];  Service: Cardiovascular;  Laterality: N/A;   ELECTROPHYSIOLOGIC STUDY N/A 11/20/2015   Procedure: A-Flutter Ablation;  Surgeon: [name], MD;  Location: [place] [facility];  Service: Cardiovascular;  Laterality: N/A;   EYE SURGERY     FRACTURE SURGERY     TESTICLE REMOVAL Right 1982   TONSILLECTOMY  ~ 1967    Family History  Problem Relation Age of Onset   Diabetes Mother    Heart disease Mother    Heart disease Father    Heart attack Father    Diabetes Father    Hypertension Father    Diabetes Sister     Social History:  reports that he quit smoking about 32 years ago. His smoking use included cigarettes. He has never used smokeless tobacco. He reports current alcohol use of about 3.0 standard drinks of alcohol per week. He reports that he does not use drugs.    Review of Systems         Lipids: He was previously on Vytorin and Lipitor. He thinks he had myalgias and weakness when taking these drugs.  He has been recommended statin drugs in the lipid clinic but he refuses to take them stating that they will not help him.   Also has significantly increased LDL particle number  He will not take non-statin medications at all includig Zetia also   Lab Results  Component Value Date   CHOL 230 (H) 07/08/2020   HDL 50 07/08/2020   LDLCALC 164 (H) 07/08/2020   TRIG 89 07/08/2020   CHOLHDL 4.6 07/08/2020    He has been  treated by cardiologist for his atrial fibrillation  BP Readings from Last 3 Encounters:  10/31/20 (!) 152/80  07/08/20 130/74  01/21/20 (!) 168/91     Physical Examination:  BP (!) 152/80 (BP Location: Left Arm, Patient Position: Sitting, Cuff Size: Normal)   Pulse 64   Ht 5\' 6"  (1.676 m)   Wt 191 lb 12.8 oz (87 kg)   SpO2 99%    BMI 30.96 kg/m   Diabetic Foot Exam - Simple   Simple Foot Form Diabetic Foot exam was performed with the following findings: Yes   Visual Inspection No deformities, no ulcerations, no other skin breakdown bilaterally: Yes See comments: Yes Sensation Testing Intact to touch and monofilament testing bilaterally: Yes Pulse Check Posterior Tibialis and Dorsalis pulse intact bilaterally: Yes Comments Ankle edema especially on the left      ASSESSMENT:  Diabetes type 2, insulin-dependent and uncontrolled   See history of present illness for detailed discussion of current diabetes management, blood sugar patterns and problems identified  His A1c  is 8.3 now  He is on a basal bolus regimen only with NPH at bedtime and mealtime insulin 2-3 times a day  As before difficult to adjust his insulin based on minimal blood sugar monitoring which he is doing mostly in the morning Does have periodic hypoglycemia overnight likely from taking more NPH insulin instead of adjusting Humalog for larger meals Unable to adjust his mealtime dose based on limited data, not checking after meals Also only has sugars after lunch when he feels hypoglycemic and likely is taking too much insulin at lunch Also occasionally high readings in the mornings often forgetting his bedtime dose of NPH He is interested in the freestyle [place] and discussed how to use this as well as need to get it from a DME supplier for Medicare  LIPIDS: Managed by cardiology, currently untreated; previously he has refused to take statin drugs despite history of coronary disease  PLAN:   Given him information on where to order the freestyle libre form Patient detailed information given on the freestyle libre use  Given him a flowsheet to adjust his bedtime NPH dose He will go up from the base amount of 20 units only if morning sugars are consistently over 130 Make sure he takes it regularly at about 10 PM rather than forgetting  sometimes at bedtime Adjust Humalog dose based on meal size and also likely needs 2 to 4 units less at lunch compared to what he is taking now With better glucose monitoring he should be able to adjust his dinner dose based on what he is planning to eat as well as from assessment of postprandial readings Consider starting him on carbohydrate counting Needs more regular follow-up  Follow-up with PCP and cardiologist for blood pressure management  There are no Patient Instructions on file for this visit.        Elayne Snare 10/31/2020, 9:44 AM   Note: This office note was prepared with Dragon voice recognition system technology. Any transcriptional errors that result from this process are unintentional.

## 2020-11-14 ENCOUNTER — Other Ambulatory Visit: Payer: Self-pay | Admitting: Cardiology

## 2020-11-14 NOTE — Telephone Encounter (Signed)
Pt last saw Dr Curt Bears 12/21/19, last labs 10/29/20 Creat 0.99, age 75, weight 87kg, based on specified criteria pt is on appropriate dosage of Eliquis '5mg'$  BID.  Will refill rx.

## 2020-11-19 ENCOUNTER — Other Ambulatory Visit: Payer: Self-pay | Admitting: Endocrinology

## 2020-12-13 NOTE — Progress Notes (Addendum)
[name])                                            [place] Team                                        Statin Quality Measure Assessment    12/13/2020  Matthew [name] 1946-03-25 [id number]  Per review of chart and payor information, this patient has been flagged for non-adherence to the following CMS Quality Measure:   '[x]'$  Statin Use in Persons with Diabetes  '[x]'$  Statin Use in Persons with Cardiovascular Disease  The ASCVD Risk score [name], et al., 2013) failed to calculate for the following reasons:   The patient has a prior MI or stroke diagnosis   Currently prescribed statin:  '[]'$  Yes '[x]'$  No     Comments: Patient is currently prescribed Praluent, but per o/v note in March 2022 w/ PCP, he is not currently taking this medication. Per previous discussion with patient, Praluent was expensive and cardiology was working with the patient to [place] for [employer].   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    Please consider the following recommendation, highlighted in blue, at the next office visit:   Initiate high intensity statin Atorvastatin '40mg'$  once daily, #90, 3 refills   Rosuvastatin '20mg'$  once daily, #90, 3 refills    Initiate moderate intensity          statin with reduced frequency if prior          statin intolerance 1x weekly, #13, 3 refills   2x weekly, #26, 3 refills   3x weekly, #39, 3 refills    Code for past statin intolerance or other exclusions (required annually)  Drug Induced Myopathy G72.0   Myositis, unspecified M60.9   Rhabdomyolysis M62.82   Prediabetes R73.03   Adverse effect of antihyperlipidemic and antiarteriosclerotic drugs, initial encounter [id number]    Thank you for your time,  [name],  [place] Cell: [phone]

## 2020-12-13 NOTE — Progress Notes (Deleted)
[facility])                                            [place] Team                                        Statin Quality Measure Assessment    12/13/2020  Matthew [name] 1945-11-19 [id number]  Per review of chart and payor information, this patient has been flagged for non-adherence to the following CMS Quality Measure:   '[x]'$  Statin Use in Persons with Diabetes  '[x]'$  Statin Use in Persons with Cardiovascular Disease  The ASCVD Risk score [name], et al., 2013) failed to calculate for the following reasons:   The patient has a prior MI or stroke diagnosis   Currently prescribed statin:  '[]'$  Yes '[x]'$  No     Comments: Patient is currently prescribed Praluent, but per o/v March 2022 w/ PCP is not currently taking this medication. Per recent patient outreach note, the cost of the Parluent is $364 and the cardiology office is working to setup [employer] for this patient.   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   Please consider ONE of the following, highlighted in blue, recommendations at the next office visit:   Initiate high intensity statin Atorvastatin '40mg'$  once daily, #90, 3 refills   Rosuvastatin '20mg'$  once daily, #90, 3 refills    Initiate moderate intensity          statin with reduced frequency if prior          statin intolerance 1x weekly, #13, 3 refills   2x weekly, #26, 3 refills   3x weekly, #39, 3 refills    Code for past statin intolerance or other exclusions (required annually)  Drug Induced Myopathy G72.0   Myositis, unspecified M60.9   Rhabdomyolysis M62.82   Prediabetes R73.03   Adverse effect of antihyperlipidemic and antiarteriosclerotic drugs,  initial encounter [id number]    Thank you for your time,  [name], [place] Cell: [phone]

## 2020-12-16 ENCOUNTER — Encounter: Payer: Self-pay | Admitting: Cardiology

## 2020-12-16 ENCOUNTER — Other Ambulatory Visit: Payer: Self-pay

## 2020-12-16 ENCOUNTER — Ambulatory Visit: Payer: Medicare HMO | Admitting: Cardiology

## 2020-12-16 VITALS — BP 132/70 | HR 84 | Ht 66.0 in | Wt 195.6 lb

## 2020-12-16 DIAGNOSIS — G720 Drug-induced myopathy: Secondary | ICD-10-CM

## 2020-12-16 DIAGNOSIS — I484 Atypical atrial flutter: Secondary | ICD-10-CM

## 2020-12-16 DIAGNOSIS — T466X5A Adverse effect of antihyperlipidemic and antiarteriosclerotic drugs, initial encounter: Secondary | ICD-10-CM

## 2020-12-16 DIAGNOSIS — T466X5D Adverse effect of antihyperlipidemic and antiarteriosclerotic drugs, subsequent encounter: Secondary | ICD-10-CM | POA: Diagnosis not present

## 2020-12-16 MED ORDER — METOPROLOL SUCCINATE ER 50 MG PO TB24: 50.0000 mg | ORAL_TABLET | Freq: Every day | ORAL | 3 refills | Status: DC

## 2020-12-16 NOTE — Patient Instructions (Addendum)
Medication Instructions:  Your physician has recommended you make the following change in your medication:  STOP Eliquis STOP Metoprolol Tartrate (Lopressor) START Metoprolol Succinate (Toprol) 50 mg once daily  *If you need a refill on your cardiac medications before your next appointment, please call your pharmacy*   Lab Work: None ordered  Testing/Procedures: None ordered   Follow-Up: At [facility], you and your health needs are our priority.  As part of our continuing mission to provide you with exceptional heart care, we have created designated Provider Care Teams.  These Care Teams include your primary Cardiologist (physician) and Advanced Practice Providers (APPs -  Physician Assistants and Nurse Practitioners) who all work together to provide you with the care you need, when you need it.  We recommend signing up for the patient portal called "MyChart".  Sign up information is provided on this After Visit Summary.  MyChart is used to connect with patients for Virtual Visits (Telemedicine).  Patients are able to view lab/test results, encounter notes, upcoming appointments, etc.  Non-urgent messages can be sent to your provider as well.   To learn more about what you can do with MyChart, go to [email or web address].    Your next appointment:   1 year(s)  The format for your next appointment:   In Person  Provider:   Allegra Lai, MD    Thank you for choosing [facility]!!   Trinidad Curet, RN [phone]   Other Instructions

## 2020-12-16 NOTE — Progress Notes (Signed)
Electrophysiology Office Note   Date:  12/16/2020   ID:  Matthew [name], DOB 11-10-1945, MRN [id number]  PCP:  Wendie Agreste, MD  Cardiologist:  Lovena Le Primary Electrophysiologist: [name], MD    No chief complaint on file.    History of Present Illness: Matthew [name] is a 75 y.o. male who is being seen today for the evaluation of atrial flutter at the request of Wendie Agreste, MD. Presenting today for electrophysiology evaluation.   He has a history significant for coronary artery disease status post CABG in 2011, atrial flutter with recurrent atrial flutter and atypical morphology.  Is initially put on Tikosyn and on amiodarone.  He is now status post ablation of typical atrial flutter 10/31/2014 with ablation of left atrial flutter 09/17/2016.  Today, denies symptoms of palpitations, chest pain, shortness of breath, orthopnea, PND, lower extremity edema, claudication, dizzin neck ess, presyncope, syncope, bleeding, or neurologic sequela. The patient is tolerating medications without difficulties.  He has had no chest pain or shortness of breath.  He is able to do all his daily activities without restriction.  He has noted no further episodes of atrial fibrillation or atrial flutter.   Past Medical History:  Diagnosis Date   Anemia    Arthritis    "back, left ankle" (11/20/2015)   Cataract    CHF (congestive heart failure) (HCC)    Chronic lower back pain    Coronary artery disease    a. 05/2009 CABG x 3: LIMA->LAD, VG->OM, VG->PDA; b. Nuc 01/2014: inf-lat scar but no ischemia, EF 57%.   Diabetic retinopathy (HCC)    mild- Dr. Herbert Deaner   Glaucoma    Hyperlipidemia    a. Intolerant of lipitor and vytorin.   Lyme disease    Migraine    "once or twice" (11/20/2015)   Myocardial infarction [facility])    'saw evidence of it on an EKG done in 2005"   Paroxysmal atrial flutter ([place])    a. 01/2014 s/p DCCV;  b. CHA2DS2VASc = 3-->Eliquis.    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 01/2014 Echo: Ef 60-65%, no rwma, mild AI/MS, mod MR, mildly dil LA.   Past Surgical History:  Procedure Laterality Date   ABDOMINAL EXPLORATION SURGERY  ~ 2005   "for hernia, but didn't have one"   ATRIAL FIBRILLATION ABLATION N/A 09/17/2016   Procedure: Atrial Fibrillation Ablation;  Surgeon: [name], MD;  Location: [place] [facility];  Service: Cardiovascular;  Laterality: N/A;   CARDIAC CATHETERIZATION  2011   CARDIOVERSION N/A 02/19/2014   Procedure: CARDIOVERSION;  Surgeon: [name], MD;  Location: [place];  Service: Cardiovascular;  Laterality: N/A;   CARDIOVERSION N/A 10/28/2015   Procedure: CARDIOVERSION;  Surgeon: [name], MD;  Location: [place];  Service: Cardiovascular;  Laterality: N/A;   CARDIOVERSION  11/20/2015   "200 joules"   CORONARY ARTERY BYPASS GRAFT  05/2009   LIMA-LAD, SVG-OM, SVG-PDA 06/20/09   ELBOW FRACTURE SURGERY Left    broken ulna on left elbow-surgical repair   ELECTROPHYSIOLOGIC STUDY N/A 10/31/2014   Procedure: A-Flutter;  Surgeon: [name], MD;  Location: [place] [facility];  Service: Cardiovascular;  Laterality: N/A;  ELECTROPHYSIOLOGIC STUDY N/A 11/20/2015   Procedure: A-Flutter Ablation;  Surgeon: [name], MD;  Location: [place] [facility];  Service: Cardiovascular;  Laterality: N/A;   EYE SURGERY     FRACTURE SURGERY     TESTICLE REMOVAL Right 1982   TONSILLECTOMY  ~ 1967     Current Outpatient Medications  Medication Sig Dispense Refill   BD VEO INSULIN SYRINGE U/F 31G X 15/64" 0.5 ML MISC USE TO INJECT INSULIN 3 TIMES A DAY. REFILLABLE ONLY WITH FOLLOW UP 200 each 3   bimatoprost (LUMIGAN) 0.01 % SOLN Place 1 drop into the left eye at bedtime.     ELIQUIS 5 MG TABS tablet TAKE 1  TABLET BY MOUTH TWICE A DAY 180 tablet 1   insulin NPH Human (NOVOLIN N) 100 UNIT/ML injection INJECT 22 UNITS IN THE SKIN DAILY AT BEDTIME. NO FURTHER REFILLS UNTIL SEEN IN THE OFFICE 10 mL 0   lisinopril (ZESTRIL) 5 MG tablet TAKE 1 TABLET BY MOUTH EVERY DAY 90 tablet 3   metoprolol tartrate (LOPRESSOR) 50 MG tablet Take 0.5 tablets (25 mg total) by mouth 2 (two) times daily. Please make yearly appt with Dr. Curt Bears for [facility] 2022 for future refills. Thank you 1st attempt 90 tablet 0   neomycin-polymyxin-hydrocortisone (CORTISPORIN) OTIC solution Place 3 drops into the left ear 3 (three) times daily. 10 mL 0   ONETOUCH VERIO test strip USE AS DIRECTED AS NEEDED 100 strip 3   Alirocumab (PRALUENT) 75 MG/ML SOAJ Inject 75 mg into the skin every 14 (fourteen) days. (Patient not taking: Reported on 12/16/2020) 2 mL 11   NOVOLOG 100 UNIT/ML injection INJECT 15-16 UNITS IN THE SKIN 3 TIMES DAILY (Patient not taking: Reported on 12/16/2020) 10 mL 1   No current facility-administered medications for this visit.    Allergies:   Actos [pioglitazone], Bee venom, Codeine, Statins, Glucophage [metformin hcl], and Raspberry   Social History:  The patient  reports that he quit smoking about 32 years ago. His smoking use included cigarettes. He has never used smokeless tobacco. He reports current alcohol use of about 3.0 standard drinks per week. He reports that he does not use drugs.   Family History:  The patient's family history includes Diabetes in his father, mother, and sister; Heart attack in his father; Heart disease in his father and mother; Hypertension in his father.   ROS:  Please see the history of present illness.   Otherwise, review of systems is positive for none.   All other systems are reviewed and negative.   PHYSICAL EXAM: VS:  BP 132/70   Pulse 84   Ht '5\' 6"'$  (1.676 m)   Wt 195 lb 9.6 oz (88.7 kg)   SpO2 98%   BMI 31.57 kg/m  , BMI Body mass index is 31.57 kg/m. GEN: Well nourished,  well developed, in no acute distress  HEENT: normal  Neck: no JVD, carotid bruits, or masses Cardiac: RRR; no murmurs, rubs, or gallops,no edema  Respiratory:  clear to auscultation bilaterally, normal work of breathing GI: soft, nontender, nondistended, + BS MS: no deformity or atrophy  Skin: warm and dry Neuro:  Strength and sensation are intact Psych: euthymic mood, full affect  EKG:  EKG is ordered today. Personal review of the ekg ordered shows sinus rhythm, right bundle branch block, inferior infarct, PVCs  Recent Labs: 07/08/2020: ALT 15; Hemoglobin 12.8; Platelets 274 10/29/2020: BUN 13; Creatinine, Ser 0.99; Potassium 4.3; Sodium 137    Lipid Panel  Component Value Date/Time   CHOL 230 (H) 07/08/2020 1318   CHOL 216 (H) 06/21/2013 1405   TRIG 89 07/08/2020 1318   TRIG 132 06/21/2013 1405   HDL 50 07/08/2020 1318   HDL 54 06/21/2013 1405   CHOLHDL 4.6 07/08/2020 1318   CHOLHDL 4 12/21/2019 0931   VLDL 14.2 12/21/2019 0931   LDLCALC 164 (H) 07/08/2020 1318   LDLCALC 136 (H) 06/21/2013 1405     Wt Readings from Last 3 Encounters:  12/16/20 195 lb 9.6 oz (88.7 kg)  10/31/20 191 lb 12.8 oz (87 kg)  07/08/20 201 lb 12.8 oz (91.5 kg)      Other studies Reviewed: Additional studies/ records that were reviewed today include: TTE  08/21/16 Review of the above records today demonstrates:  - Left ventricle: The cavity size was normal. Wall thickness was   increased in a pattern of mild LVH. Systolic function was normal.   The estimated ejection fraction was in the range of 55% to 60%.   Wall motion was normal; there were no regional wall motion   abnormalities. Features are consistent with a pseudonormal left   ventricular filling pattern, with concomitant abnormal relaxation   and increased filling pressure (grade 2 diastolic dysfunction).   Doppler parameters are consistent with high ventricular filling   pressure. - Mitral valve: Severely calcified annulus. There  was mild   regurgitation. - Left atrium: The atrium was mildly dilated.   ASSESSMENT AND PLAN:  1.  Atypical atrial flutter: Status post ablation 09/17/2016.  Currently on Eliquis.  Remains in sinus rhythm.  CHA2DS2-VASc of 4.  He has not had any arrhythmias since 2018.  Due to that we Mekesha Solomon stop Eliquis.  We Kethan Papadopoulos also stop his metoprolol and start Toprol-XL 50 mg.  2.  Coronary artery disease: No current chest pain.  Continue current management.  3.  Hyperlipidemia: Has had a statin myopathy.  Lipids significantly elevated at low goal of 70.  He does not wish to start Repatha at this time.  He Takaya Hyslop call us back if he does wish to start it.   Current medicines are reviewed at length with the patient today.   The patient does not have concerns regarding his medicines.  The following changes were made today: Stop Eliquis, start Toprol-XL  Labs/ tests ordered today include:  Orders Placed This Encounter  Procedures   EKG 12-Lead      Disposition:   FU with Sukari Grist 12 months  Signed, Abijah Roussel Meredith Leeds, MD  12/16/2020 4:25 PM     [address] [place] [address] [phone] (office) [phone] (fax)

## 2020-12-17 ENCOUNTER — Other Ambulatory Visit: Payer: Self-pay | Admitting: Cardiology

## 2020-12-17 DIAGNOSIS — I483 Typical atrial flutter: Secondary | ICD-10-CM

## 2020-12-17 NOTE — Telephone Encounter (Signed)
Outpatient Medication Detail   Disp Refills Start End   metoprolol succinate (TOPROL-XL) 50 MG 24 hr tablet 90 tablet 3 12/16/2020 03/16/2021   Sig - Route: Take 1 tablet (50 mg total) by mouth daily. Take with or immediately following a meal. - Oral   Sent to pharmacy as: metoprolol succinate (TOPROL-XL) 50 MG 24 hr tablet   Notes to Pharmacy: Stopping Lopressor   E-Prescribing Status: Receipt confirmed by pharmacy (12/16/2020  4:28 PM EDT)    Pharmacy  [id number] [place] [address]

## 2020-12-31 ENCOUNTER — Other Ambulatory Visit: Payer: Self-pay | Admitting: Endocrinology

## 2020-12-31 DIAGNOSIS — Z794 Long term (current) use of insulin: Secondary | ICD-10-CM

## 2020-12-31 DIAGNOSIS — E1165 Type 2 diabetes mellitus with hyperglycemia: Secondary | ICD-10-CM

## 2021-01-06 ENCOUNTER — Other Ambulatory Visit: Payer: Self-pay | Admitting: Endocrinology

## 2021-01-07 LAB — HM DIABETES EYE EXAM

## 2021-01-20 ENCOUNTER — Other Ambulatory Visit: Payer: Medicare HMO

## 2021-01-22 ENCOUNTER — Ambulatory Visit: Payer: Medicare HMO | Admitting: Endocrinology

## 2021-01-30 ENCOUNTER — Telehealth: Payer: Self-pay | Admitting: Family Medicine

## 2021-01-30 NOTE — Chronic Care Management (AMB) (Signed)
  Chronic Care Management   Outreach Note  01/30/2021 Name: Braulio L Harpster III MRN: [id number] DOB: 19-Jan-1946  Referred by: Wendie Agreste, MD Reason for referral : No chief complaint on file.   An unsuccessful telephone outreach was attempted today. The patient was referred to the pharmacist for assistance with care management and care coordination.   Follow Up Plan:   Tatjana Dellinger Upstream Scheduler

## 2021-02-05 ENCOUNTER — Telehealth: Payer: Self-pay | Admitting: Family Medicine

## 2021-02-05 NOTE — Telephone Encounter (Signed)
Called patient to review findings from retinopathy screening.  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 I will try to call patient again to discuss these details and to determine if he has been seen by ophthalmology recently. If not I will refer.

## 2021-02-06 NOTE — Telephone Encounter (Signed)
Called pt - discussed results. Followed by Dr. Kathlen Mody at [facility]. Last visit in January, told had bleeding in R eye in past? Nevus in eye in past? Not sure which eye.  Will d/w Dr. Kathlen Mody to decide if eval needed and advise patient. Note from Dr. Kathlen Mody in 2021 noted.

## 2021-02-07 ENCOUNTER — Other Ambulatory Visit: Payer: Self-pay | Admitting: Endocrinology

## 2021-02-07 DIAGNOSIS — E1165 Type 2 diabetes mellitus with hyperglycemia: Secondary | ICD-10-CM

## 2021-02-07 DIAGNOSIS — Z794 Long term (current) use of insulin: Secondary | ICD-10-CM

## 2021-02-19 ENCOUNTER — Other Ambulatory Visit: Payer: Self-pay | Admitting: Cardiology

## 2021-02-23 ENCOUNTER — Other Ambulatory Visit: Payer: Self-pay | Admitting: Endocrinology

## 2021-03-08 ENCOUNTER — Other Ambulatory Visit: Payer: Self-pay | Admitting: Endocrinology

## 2021-03-08 DIAGNOSIS — E1165 Type 2 diabetes mellitus with hyperglycemia: Secondary | ICD-10-CM

## 2021-03-08 DIAGNOSIS — Z794 Long term (current) use of insulin: Secondary | ICD-10-CM

## 2021-03-25 DIAGNOSIS — H4032X1 Glaucoma secondary to eye trauma, left eye, mild stage: Secondary | ICD-10-CM | POA: Diagnosis not present

## 2021-03-25 DIAGNOSIS — H43822 Vitreomacular adhesion, left eye: Secondary | ICD-10-CM | POA: Diagnosis not present

## 2021-03-25 DIAGNOSIS — E113392 Type 2 diabetes mellitus with moderate nonproliferative diabetic retinopathy without macular edema, left eye: Secondary | ICD-10-CM | POA: Diagnosis not present

## 2021-03-25 DIAGNOSIS — H40052 Ocular hypertension, left eye: Secondary | ICD-10-CM | POA: Diagnosis not present

## 2021-03-25 DIAGNOSIS — H40021 Open angle with borderline findings, high risk, right eye: Secondary | ICD-10-CM | POA: Diagnosis not present

## 2021-03-25 DIAGNOSIS — E113311 Type 2 diabetes mellitus with moderate nonproliferative diabetic retinopathy with macular edema, right eye: Secondary | ICD-10-CM | POA: Diagnosis not present

## 2021-03-31 ENCOUNTER — Other Ambulatory Visit: Payer: Self-pay

## 2021-03-31 ENCOUNTER — Other Ambulatory Visit (INDEPENDENT_AMBULATORY_CARE_PROVIDER_SITE_OTHER): Payer: Medicare HMO

## 2021-03-31 DIAGNOSIS — E1165 Type 2 diabetes mellitus with hyperglycemia: Secondary | ICD-10-CM

## 2021-03-31 DIAGNOSIS — Z794 Long term (current) use of insulin: Secondary | ICD-10-CM

## 2021-03-31 LAB — BASIC METABOLIC PANEL
BUN: 23 mg/dL (ref 6–23)
CO2: 29 mEq/L (ref 19–32)
Calcium: 9.3 mg/dL (ref 8.4–10.5)
Chloride: 101 mEq/L (ref 96–112)
Creatinine, Ser: 1.02 mg/dL (ref 0.40–1.50)
GFR: 73.2 mL/min (ref >60.00)
Glucose, Bld: 145 mg/dL — ABNORMAL HIGH (ref 70–99)
Potassium: 5 mEq/L (ref 3.5–5.1)
Sodium: 137 mEq/L (ref 135–145)

## 2021-03-31 LAB — HEMOGLOBIN A1C: Hgb A1c MFr Bld: 9 % — ABNORMAL HIGH (ref 4.6–6.5)

## 2021-04-01 DIAGNOSIS — H40052 Ocular hypertension, left eye: Secondary | ICD-10-CM | POA: Diagnosis not present

## 2021-04-01 DIAGNOSIS — H5709 Other anomalies of pupillary function: Secondary | ICD-10-CM | POA: Diagnosis not present

## 2021-04-01 DIAGNOSIS — H47292 Other optic atrophy, left eye: Secondary | ICD-10-CM | POA: Diagnosis not present

## 2021-04-01 DIAGNOSIS — H40021 Open angle with borderline findings, high risk, right eye: Secondary | ICD-10-CM | POA: Diagnosis not present

## 2021-04-02 ENCOUNTER — Other Ambulatory Visit: Payer: Self-pay | Admitting: Ophthalmology

## 2021-04-02 ENCOUNTER — Telehealth: Payer: Self-pay | Admitting: Family Medicine

## 2021-04-02 DIAGNOSIS — H4720 Unspecified optic atrophy: Secondary | ICD-10-CM

## 2021-04-02 NOTE — Telephone Encounter (Signed)
Discussed findings of right pigmented choroidal lesion noted on retinopathy screening with his eye care provider office.  Previous provider who is no longer there.  Other provider did discuss his most recent exam 1 week ago that was dilated and known area without new concerns.

## 2021-04-03 ENCOUNTER — Ambulatory Visit (INDEPENDENT_AMBULATORY_CARE_PROVIDER_SITE_OTHER): Payer: Medicare HMO | Admitting: Endocrinology

## 2021-04-03 ENCOUNTER — Encounter: Payer: Self-pay | Admitting: Endocrinology

## 2021-04-03 ENCOUNTER — Other Ambulatory Visit: Payer: Self-pay

## 2021-04-03 ENCOUNTER — Other Ambulatory Visit: Payer: Self-pay | Admitting: Endocrinology

## 2021-04-03 VITALS — BP 122/80 | HR 94 | Ht 66.0 in | Wt 197.8 lb

## 2021-04-03 DIAGNOSIS — E1165 Type 2 diabetes mellitus with hyperglycemia: Secondary | ICD-10-CM

## 2021-04-03 DIAGNOSIS — E7800 Pure hypercholesterolemia, unspecified: Secondary | ICD-10-CM | POA: Diagnosis not present

## 2021-04-03 DIAGNOSIS — Z794 Long term (current) use of insulin: Secondary | ICD-10-CM | POA: Diagnosis not present

## 2021-04-03 MED ORDER — DEXCOM G6 TRANSMITTER MISC: 1.0000 | Freq: Once | 1 refills | Status: DC

## 2021-04-03 MED ORDER — DEXCOM G6 SENSOR MISC: 3 refills | Status: DC

## 2021-04-03 MED ORDER — INSULIN ASPART 100 UNIT/ML IJ SOLN: INTRAMUSCULAR | 2 refills | Status: DC

## 2021-04-03 MED ORDER — DEXCOM G6 RECEIVER DEVI: 0 refills | Status: DC

## 2021-04-03 MED ORDER — INSULIN NPH (HUMAN) (ISOPHANE) 100 UNIT/ML ~~LOC~~ SUSP: SUBCUTANEOUS | 2 refills | Status: DC

## 2021-04-03 MED ORDER — DEXCOM G6 TRANSMITTER MISC: 1.0000 | Freq: Once | 1 refills | Status: AC

## 2021-04-03 NOTE — Patient Instructions (Signed)
LEQVIO for cholesterol  Check blood sugars on waking up 4-5 days a week  Also check blood sugars about 2 hours after meals and do this after different meals by rotation  Recommended blood sugar levels on waking up are 90-130 and about 2 hours after meal is 130-160  Please bring your blood sugar monitor to each visit, thank you

## 2021-04-03 NOTE — Progress Notes (Signed)
Patient ID: Matthew [name], male   DOB: 01-19-1946, 75 y.o.   MRN: [id number]           Reason for Appointment: Followup for Type 2 Diabetes    History of Present Illness:          Diagnosis: Type 2 diabetes mellitus, date of diagnosis:  1992      Past history: His blood sugar was high at diagnosis when he was having a routine screening done. He was started on Glucophage initially which he took for about a year. He thinks it did not help his sugar much and he did not feel good with it Apparently he was trying to control his diabetes with diet and exercise for a few years. He may have tried Glucophage again before going on insulin. Also was started on Actos which caused swelling He has been treated mostly with premixed insulin for about 15 years and his level of control appears to be inadequate although details of previous treatment are not available. He may have taken Humalog at one time for use with high sugars A1c was 11.5 in 2011 On his initial consultation in 9/15 because of poor control with premixed insulin he was switched to Levemir twice a day and Humalog with meals.  Recent history:   INSULIN regimen is described as:20-22 N at 10 pm ; Humalog 15-16 units with meals     Current management, blood sugar patterns, current level of control and problems:  His A1c is still over 8% and now 9%  He has not been seen in follow-up since July  He was again recommended using the freestyle libre system on his last visit but he did not pursue this and did not call the DME suppliers as recommended  In the last month he has checked his blood sugar only 3 times with only 1 reading of 55 in the morning  He is still adjusting his NPH insulin at night based on how he is eating rather than the fasting reading On the last visit he was given him flowsheet to adjust his mealtime dose based on fasting blood sugar patterns but he has not used this  Also appears to be taking about the same amount  of mealtime coverage regardless of what he is eating most of the time  He thinks he is not having any significant hypoglycemia and can recognize the symptoms of low sugars  However he previously did have an episode of severe hypoglycemia requiring EMS help  Lab fasting glucose 145  He will sometimes will take his Humalog right after eating instead of before Weight is about the same recently but higher than in July He has been reluctant to go on on insulin pump He says his insurance company will cover the Dexcom sensor but he just found out this this week Has not been able to exercise because of back pain    With hypoglycemia he has symptoms of weakness, sweating, shakiness and confusion Treating low sugars with orange juice       Oral hypoglycemic drugs the patient is taking are: None       Side effects from medications have been: Edema from Actos,? Nausea and malaise from metformin, polyuria from [place]  Compliance with the medical regimen: Fair  Glucose monitoring:  done less than one time a day         Glucometer: One Touch.       Blood Glucose readings as above  Previously:   PRE-MEAL  Fasting Lunch Dinner Overnight Overall  Glucose range: 52-330  44-323 47, 188   Mean/median: 170    155   POST-MEAL PC Breakfast PC Lunch PC Dinner  Glucose range:   ?  Mean/median:       Self-care:   Meals: 2-3 meals per day.  Bfst 8:30-9, 1 pm Dinner is about 8 PM.  Some meals are high fat        Dietician visit, most recent:?  15 years ago.      CDE visit: 3/16            Weight history:  Wt Readings from Last 3 Encounters:  04/03/21 197 lb 12.8 oz (89.7 kg)  12/16/20 195 lb 9.6 oz (88.7 kg)  10/31/20 191 lb 12.8 oz (87 kg)    Glycemic control:   Lab Results  Component Value Date   HGBA1C 9.0 (H) 03/31/2021   HGBA1C 8.3 (H) 10/29/2020   HGBA1C 8.8 (H) 07/08/2020   Lab Results  Component Value Date   MICROALBUR 7.0 (H) 10/29/2020   LDLCALC 164 (H) 07/08/2020    CREATININE 1.02 03/31/2021    Lab on 03/31/2021  Component Date Value Ref Range Status   Sodium 03/31/2021 137  135 - 145 mEq/L Final   Potassium 03/31/2021 5.0  3.5 - 5.1 mEq/L Final   Chloride 03/31/2021 101  96 - 112 mEq/L Final   CO2 03/31/2021 29  19 - 32 mEq/L Final   Glucose, Bld 03/31/2021 145 (H)  70 - 99 mg/dL Final   BUN 03/31/2021 23  6 - 23 mg/dL Final   Creatinine, Ser 03/31/2021 1.02  0.40 - 1.50 mg/dL Final   GFR 03/31/2021 73.20  >60.00 mL/min Final   Calculated using the CKD-EPI Creatinine Equation (2021)   Calcium 03/31/2021 9.3  8.4 - 10.5 mg/dL Final   Hgb A1c MFr Bld 03/31/2021 9.0 (H)  4.6 - 6.5 % Final   Glycemic Control Guidelines for People with Diabetes:Non Diabetic:  <6%Goal of Therapy: <7%Additional Action Suggested:  >8%       Allergies as of 04/03/2021       Reactions   Actos [pioglitazone] Swelling   Bee Venom Swelling   Codeine Hives, Nausea Only   Statins Other (See Comments)   Muscle pain, fatigue   Glucophage [metformin Hcl] Nausea Only   Raspberry Itching        Medication List        Accurate as of April 03, 2021 11:57 AM. If you have any questions, ask your nurse or doctor.          BD Veo Insulin Syringe U/F 31G X 15/64" 0.5 ML Misc Generic drug: Insulin Syringe-Needle U-100 USE TO INJECT INSULIN 3 TIMES A DAY. REFILLABLE ONLY WITH FOLLOW UP   bimatoprost 0.01 % Soln Commonly known as: LUMIGAN Place 1 drop into the left eye at bedtime.   Dexcom G6 Receiver Devi Use to receive data from sensor Started by: Elayne Snare, MD   Dexcom G6 Sensor Misc Use to monitor blood sugar, change after 10 days Started by: Elayne Snare, MD   Dexcom G6 Transmitter Misc 1 Device by Does not apply route once for 1 dose. Started by: Elayne Snare, MD   insulin NPH Human 100 UNIT/ML injection Commonly known as: NovoLIN N INJECT 22 UNITS IN THE SKIN DAILY AT BEDTIME. NO FURTHER REFILLS UNTIL SEEN IN THE OFFICE   lisinopril 5 MG  tablet Commonly known as: ZESTRIL TAKE 1 TABLET BY MOUTH EVERY  DAY   metoprolol succinate 50 MG 24 hr tablet Commonly known as: TOPROL-XL Take 1 tablet (50 mg total) by mouth daily. Take with or immediately following a meal.   neomycin-polymyxin-hydrocortisone OTIC solution Commonly known as: CORTISPORIN Place 3 drops into the left ear 3 (three) times daily.   NovoLOG 100 UNIT/ML injection Generic drug: insulin aspart INJECT 15-16 UNITS IN THE SKIN 3 TIMES DAILY   OneTouch Verio test strip Generic drug: glucose blood USE AS DIRECTED AS NEEDED   Praluent 75 MG/ML Soaj Generic drug: Alirocumab Inject 75 mg into the skin every 14 (fourteen) days.        Allergies:  Allergies  Allergen Reactions   Actos [Pioglitazone] Swelling   Bee Venom Swelling   Codeine Hives and Nausea Only   Statins Other (See Comments)    Muscle pain, fatigue   Glucophage [Metformin Hcl] Nausea Only   Raspberry Itching    Past Medical History:  Diagnosis Date   Anemia    Arthritis    "back, left ankle" (11/20/2015)   Cataract    CHF (congestive heart failure) (HCC)    Chronic lower back pain    Coronary artery disease    a. 05/2009 CABG x 3: LIMA->LAD, VG->OM, VG->PDA; b. Nuc 01/2014: inf-lat scar but no ischemia, EF 57%.   Diabetic retinopathy (HCC)    mild- Dr. Herbert Deaner   Glaucoma    Hyperlipidemia    a. Intolerant of lipitor and vytorin.   Lyme disease    Migraine    "once or twice" (11/20/2015)   Myocardial infarction [facility])    'saw evidence of it on an EKG done in 2005"   Paroxysmal atrial flutter ([place])    a. 01/2014 s/p DCCV;  b. CHA2DS2VASc = 3-->Eliquis.   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 01/2014 Echo: Ef 60-65%, no rwma, mild AI/MS, mod MR, mildly dil LA.    Past Surgical History:  Procedure  Laterality Date   ABDOMINAL EXPLORATION SURGERY  ~ 2005   "for hernia, but didn't have one"   ATRIAL FIBRILLATION ABLATION N/A 09/17/2016   Procedure: Atrial Fibrillation Ablation;  Surgeon: [name], MD;  Location: [place] [facility];  Service: Cardiovascular;  Laterality: N/A;   CARDIAC CATHETERIZATION  2011   CARDIOVERSION N/A 02/19/2014   Procedure: CARDIOVERSION;  Surgeon: [name], MD;  Location: [place];  Service: Cardiovascular;  Laterality: N/A;   CARDIOVERSION N/A 10/28/2015   Procedure: CARDIOVERSION;  Surgeon: [name], MD;  Location: [place];  Service: Cardiovascular;  Laterality: N/A;   CARDIOVERSION  11/20/2015   "200 joules"   CORONARY ARTERY BYPASS GRAFT  05/2009   LIMA-LAD, SVG-OM, SVG-PDA 06/20/09   ELBOW FRACTURE SURGERY Left    broken ulna on left elbow-surgical repair   ELECTROPHYSIOLOGIC STUDY N/A 10/31/2014   Procedure: A-Flutter;  Surgeon: [name], MD;  Location: [place] [facility];  Service: Cardiovascular;  Laterality: N/A;   ELECTROPHYSIOLOGIC STUDY N/A 11/20/2015   Procedure: A-Flutter Ablation;  Surgeon: [name], MD;  Location: [place] [facility];  Service: Cardiovascular;  Laterality: N/A;   EYE SURGERY     FRACTURE SURGERY     TESTICLE REMOVAL Right 1982   TONSILLECTOMY  ~  44    Family History  Problem Relation Age of Onset   Diabetes Mother    Heart disease Mother    Heart disease Father    Heart attack Father    Diabetes Father    Hypertension Father    Diabetes Sister     Social History:  reports that he quit smoking about 32 years ago. His smoking use included cigarettes. He has never used smokeless tobacco. He reports current alcohol use of about 3.0 standard drinks per week. He reports that he does not use drugs.    Review of Systems         Lipids: He was previously on Vytorin and Lipitor. He thinks he had myalgias and weakness when taking these drugs.  He has been recommended statin  drugs in the lipid clinic but he refuses to take them stating that they will not help him.   Also has significantly increased LDL particle number  He was prescribed Praluent at the cardiology clinic but appears that his last prescription was filled in March and none since then   Lab Results  Component Value Date   CHOL 230 (H) 07/08/2020   HDL 50 07/08/2020   [place] 164 (H) 07/08/2020   TRIG 89 07/08/2020   CHOLHDL 4.6 07/08/2020    He has been  treated by cardiologist for his atrial fibrillation  Currently on 5 mg lisinopril, not clear if he has had diagnosis of hypertension  BP Readings from Last 3 Encounters:  04/03/21 122/80  12/16/20 132/70  10/31/20 (!) 152/80     Physical Examination:  BP 122/80 (BP Location: Left Arm, Patient Position: Sitting, Cuff Size: Normal)   Pulse 94   Ht 5\' 6"  (1.676 m)   Wt 197 lb 12.8 oz (89.7 kg)   SpO2 97%   BMI 31.93 kg/m     ASSESSMENT:  Diabetes type 2, insulin-dependent and uncontrolled   See history of present illness for detailed discussion of current diabetes management, blood sugar patterns and problems identified  His A1c is 9% now  He is on a basal bolus regimen only with NPH at bedtime and mealtime insulin 2-3 times a day  Discussed his A1c result Likely has periodic hyperglycemia especially after meals as no indication that his fasting readings are high  He does not monitor his blood sugars except only occasionally at random As before he was advised about benefits of continued glucose monitoring and now he is wanting to look into the Dexcom sensor which may be covered by his insurance  Again discussed adjustment of mealtime dose based on meal size and carbohydrates along with Premeal blood sugar  LIPIDS: Previously managed by pharmacist at the cardiology office, currently appears untreated; not clear if he had taken his Praluent prescription that was given in March and has not followed up LDL target less than  70  PLAN:   Dexcom sensor will be prescribed  He will only adjust his bedtime NPH dose based on his fasting blood sugar trend and not based on her diet  Adjust NovoLog based on meal size and carbohydrates  May benefit from more diabetes education including carbohydrate counting  He will let us know when he has a Dexcom sensor and if he needs any help starting this  Also discussed potentially using the OmniPod for better insulin delivery especially when he is able to get approval for OmniPod 5 Needs more regular follow-up  Follow-up with PCP and cardiologist for blood pressure management However may  be a candidate for treatment with Leqvio which may be simpler than doing Praluent which he has not taken and will discuss further on the next visit  Labs to be checked for lipids again  Patient Instructions  LEQVIO for cholesterol  Check blood sugars on waking up 4-5 days a week  Also check blood sugars about 2 hours after meals and do this after different meals by rotation  Recommended blood sugar levels on waking up are 90-130 and about 2 hours after meal is 130-160  Please bring your blood sugar monitor to each visit, thank you        Elayne Snare 04/03/2021, 11:57 AM   Note: This office note was prepared with Dragon voice recognition system technology. Any transcriptional errors that result from this process are unintentional.

## 2021-04-04 ENCOUNTER — Other Ambulatory Visit (HOSPITAL_COMMUNITY): Payer: Self-pay

## 2021-04-14 ENCOUNTER — Other Ambulatory Visit: Payer: Self-pay | Admitting: Endocrinology

## 2021-04-14 DIAGNOSIS — Z794 Long term (current) use of insulin: Secondary | ICD-10-CM

## 2021-04-26 ENCOUNTER — Ambulatory Visit
Admission: RE | Admit: 2021-04-26 | Discharge: 2021-04-26 | Disposition: A | Discharge status: Home / Self Care | Payer: Medicare HMO | Source: Ambulatory Visit | Attending: Ophthalmology | Admitting: Ophthalmology

## 2021-04-26 ENCOUNTER — Other Ambulatory Visit: Payer: Self-pay

## 2021-04-26 DIAGNOSIS — H4720 Unspecified optic atrophy: Secondary | ICD-10-CM

## 2021-04-26 DIAGNOSIS — J341 Cyst and mucocele of nose and nasal sinus: Secondary | ICD-10-CM | POA: Diagnosis not present

## 2021-04-26 DIAGNOSIS — I6782 Cerebral ischemia: Secondary | ICD-10-CM | POA: Diagnosis not present

## 2021-04-26 DIAGNOSIS — I629 Nontraumatic intracranial hemorrhage, unspecified: Secondary | ICD-10-CM | POA: Diagnosis not present

## 2021-04-26 DIAGNOSIS — H47299 Other optic atrophy, unspecified eye: Secondary | ICD-10-CM | POA: Diagnosis not present

## 2021-04-26 DIAGNOSIS — M47812 Spondylosis without myelopathy or radiculopathy, cervical region: Secondary | ICD-10-CM | POA: Diagnosis not present

## 2021-04-26 MED ORDER — GADOBENATE DIMEGLUMINE 529 MG/ML IV SOLN
18.0000 mL | Freq: Once | INTRAVENOUS | Status: AC | PRN
  Administered 2021-04-26: 18 mL via INTRAVENOUS

## 2021-05-05 ENCOUNTER — Telehealth: Payer: Self-pay | Admitting: Endocrinology

## 2021-05-05 NOTE — Telephone Encounter (Signed)
Patient called in regards to documentation needed to completed on the below. The documentation came from [facility].  Continuous Blood Gluc Receiver (DEXCOM G6 RECEIVER) DEVI Continuous Blood Gluc Sensor (DEXCOM G6 SENSOR) MISC  Needed before patient can receive the above.Please provide confirmation to patient via [facility] chart.

## 2021-05-09 NOTE — Telephone Encounter (Signed)
I have not received any paperwork from [facility] but most recent chart notes were sent to company.

## 2021-05-22 ENCOUNTER — Other Ambulatory Visit: Payer: Self-pay

## 2021-05-22 DIAGNOSIS — E1165 Type 2 diabetes mellitus with hyperglycemia: Secondary | ICD-10-CM

## 2021-05-22 DIAGNOSIS — Z794 Long term (current) use of insulin: Secondary | ICD-10-CM

## 2021-05-22 MED ORDER — DEXCOM G6 RECEIVER DEVI: 0 refills | Status: DC

## 2021-05-23 ENCOUNTER — Other Ambulatory Visit: Payer: Self-pay | Admitting: Endocrinology

## 2021-05-23 DIAGNOSIS — E1165 Type 2 diabetes mellitus with hyperglycemia: Secondary | ICD-10-CM

## 2021-05-23 MED ORDER — DEXCOM G6 RECEIVER DEVI: 0 refills | Status: DC

## 2021-05-23 MED ORDER — DEXCOM G6 SENSOR MISC: 3 refills | Status: DC

## 2021-05-27 ENCOUNTER — Telehealth: Payer: Self-pay | Admitting: Endocrinology

## 2021-05-27 NOTE — Telephone Encounter (Signed)
[employer] [employer] called and they are requesting fax orders for Dexcom to be sent - Fax # [phone]

## 2021-05-27 NOTE — Telephone Encounter (Signed)
Written order form has been placed on provider desk to sign

## 2021-05-30 NOTE — Telephone Encounter (Signed)
Written order has been faxed to [facility].

## 2021-06-10 ENCOUNTER — Other Ambulatory Visit: Payer: Medicare HMO

## 2021-06-11 ENCOUNTER — Other Ambulatory Visit: Payer: Self-pay | Admitting: Endocrinology

## 2021-06-12 ENCOUNTER — Ambulatory Visit: Payer: Medicare HMO | Admitting: Endocrinology

## 2021-06-13 ENCOUNTER — Other Ambulatory Visit: Payer: Self-pay | Admitting: Endocrinology

## 2021-06-20 DIAGNOSIS — E1165 Type 2 diabetes mellitus with hyperglycemia: Secondary | ICD-10-CM | POA: Diagnosis not present

## 2021-07-02 DIAGNOSIS — H5709 Other anomalies of pupillary function: Secondary | ICD-10-CM | POA: Diagnosis not present

## 2021-07-02 DIAGNOSIS — H35033 Hypertensive retinopathy, bilateral: Secondary | ICD-10-CM | POA: Diagnosis not present

## 2021-07-02 DIAGNOSIS — H40021 Open angle with borderline findings, high risk, right eye: Secondary | ICD-10-CM | POA: Diagnosis not present

## 2021-07-02 DIAGNOSIS — H43822 Vitreomacular adhesion, left eye: Secondary | ICD-10-CM | POA: Diagnosis not present

## 2021-07-02 DIAGNOSIS — H40052 Ocular hypertension, left eye: Secondary | ICD-10-CM | POA: Diagnosis not present

## 2021-07-07 ENCOUNTER — Other Ambulatory Visit (INDEPENDENT_AMBULATORY_CARE_PROVIDER_SITE_OTHER): Payer: Medicare HMO

## 2021-07-07 ENCOUNTER — Other Ambulatory Visit: Payer: Self-pay

## 2021-07-07 DIAGNOSIS — E7800 Pure hypercholesterolemia, unspecified: Secondary | ICD-10-CM | POA: Diagnosis not present

## 2021-07-07 DIAGNOSIS — E1165 Type 2 diabetes mellitus with hyperglycemia: Secondary | ICD-10-CM | POA: Diagnosis not present

## 2021-07-07 DIAGNOSIS — Z794 Long term (current) use of insulin: Secondary | ICD-10-CM

## 2021-07-07 LAB — LIPID PANEL
Cholesterol: 228 mg/dL — ABNORMAL HIGH (ref 0–200)
HDL: 45.4 mg/dL (ref >39.00)
LDL Cholesterol: 147 mg/dL — ABNORMAL HIGH (ref 0–99)
NonHDL: 182.67
Total CHOL/HDL Ratio: 5
Triglycerides: 177 mg/dL — ABNORMAL HIGH (ref 0.0–149.0)
VLDL: 35.4 mg/dL (ref 0.0–40.0)

## 2021-07-07 LAB — BASIC METABOLIC PANEL
BUN: 22 mg/dL (ref 6–23)
CO2: 28 mEq/L (ref 19–32)
Calcium: 9.2 mg/dL (ref 8.4–10.5)
Chloride: 100 mEq/L (ref 96–112)
Creatinine, Ser: 1 mg/dL (ref 0.40–1.50)
GFR: 74.82 mL/min (ref >60.00)
Glucose, Bld: 247 mg/dL — ABNORMAL HIGH (ref 70–99)
Potassium: 4.9 mEq/L (ref 3.5–5.1)
Sodium: 136 mEq/L (ref 135–145)

## 2021-07-07 LAB — HEMOGLOBIN A1C: Hgb A1c MFr Bld: 9 % — ABNORMAL HIGH (ref 4.6–6.5)

## 2021-07-10 ENCOUNTER — Other Ambulatory Visit: Payer: Self-pay

## 2021-07-10 ENCOUNTER — Ambulatory Visit: Payer: Medicare HMO | Admitting: Endocrinology

## 2021-07-10 ENCOUNTER — Encounter: Payer: Self-pay | Admitting: Endocrinology

## 2021-07-10 VITALS — BP 122/70 | HR 89 | Ht 66.0 in | Wt 193.8 lb

## 2021-07-10 DIAGNOSIS — Z794 Long term (current) use of insulin: Secondary | ICD-10-CM | POA: Diagnosis not present

## 2021-07-10 DIAGNOSIS — E7800 Pure hypercholesterolemia, unspecified: Secondary | ICD-10-CM

## 2021-07-10 DIAGNOSIS — E1165 Type 2 diabetes mellitus with hyperglycemia: Secondary | ICD-10-CM

## 2021-07-10 NOTE — Patient Instructions (Addendum)
Target am sugar 100-150 with adjusting bedtime dose. ? ?Lunch and supper 10-12 instead of 15 units ? ?Target Sugar rise 40-80 mg after a meal.  ? ?Overall target 70-180 ? ?Check on Leqvio ? ? ? ?

## 2021-07-10 NOTE — Progress Notes (Signed)
Patient ID: Matthew [name], male   DOB: April 23, 1946, 76 y.o.   MRN: [id number] ? ?       ? ? ?Reason for Appointment: Followup for Type 2 Diabetes ? ? ? ?History of Present Illness:  ?        ?Diagnosis: Type 2 diabetes mellitus, date of diagnosis:  1992     ? ?Past history: His blood sugar was high at diagnosis when he was having a routine screening done. He was started on Glucophage initially which he took for about a year. He thinks it did not help his sugar much and he did not feel good with it ?Apparently he was trying to control his diabetes with diet and exercise for a few years. He may have tried Glucophage again before going on insulin. Also was started on Actos which caused swelling ?He has been treated mostly with premixed insulin for about 15 years and his level of control appears to be inadequate although details of previous treatment are not available. He may have taken Humalog at one time for use with high sugars ?A1c was 11.5 in 2011 ?On his initial consultation in 9/15 because of poor control with premixed insulin he was switched to Levemir twice a day and Humalog with meals. ? ?Recent history:  ? ?INSULIN regimen is described as:22 N at 11 pm ; Novolog 15-16 units with meals  ? ?  ?Current management, blood sugar patterns, current level of control and problems: ? ?His A1c is still  9% ? ? ?He was given a prescription for the Dexcom sensor in December sent through the mail order pharmacy but he only started using this today because of some delay in getting the supplies  ?Although his blood sugars are relatively low in the late afternoons and evenings he is likely monitoring blood sugars only when he is feeling symptomatic with low sugars and not routinely  ?Today however since his lunch his blood sugars are around 250 on the sensor  ?He does not check his blood sugars in the mornings routinely and has only 1 early morning reading of 75 at 4 AM  ?He also has 1 high reading only of 320 done  midmorning, not clear if this is after breakfast  ?He does not adjust his mealtime dose based on what he is planning to eat regardless of meal size or carbohydrates ?Also has not reduced his lunchtime/dinnertime coverage for certain meals even though he is getting periodic low sugars late afternoon and after dinner  ?He thinks he is mostly taking 22 units of NPH at bedtime but not clear if he is making any adjustments with blood sugar monitoring ?Likely has cut back on his portions as his weight is down slightly ?As before he is unable to exercise ?  ? ?With hypoglycemia he has symptoms of weakness, sweating, shakiness and confusion ?Treating low sugars with orange juice    ?   ?Oral hypoglycemic drugs the patient is taking are: None    ?   ?Side effects from medications have been: Edema from Actos,? Nausea and malaise from metformin, polyuria from [place] ? ?Compliance with the medical regimen: Fair ? ?Glucose monitoring:  done less than one time a day         Glucometer: One Touch.      ? ?Blood Glucose readings as above ? ?Blood sugar range between 3 PM-10 PM = 49-79 ? ? ? ?Self-care:   ?Meals: 2-3 meals per day.  Bfst 8:30-9, 1  pm Dinner is about 8 PM.  Some meals are high fat      ? ? ?Dietician visit, most recent:?  15 years ago.  ?    ?CDE visit: 3/16           ? ?Weight history: ? ?Wt Readings from Last 3 Encounters:  ?07/10/21 193 lb 12.8 oz (87.9 kg)  ?04/03/21 197 lb 12.8 oz (89.7 kg)  ?12/16/20 195 lb 9.6 oz (88.7 kg)  ? ? ?Glycemic control: ?  ?Lab Results  ?Component Value Date  ? HGBA1C 9.0 (H) 07/07/2021  ? HGBA1C 9.0 (H) 03/31/2021  ? HGBA1C 8.3 (H) 10/29/2020  ? ?Lab Results  ?Component Value Date  ? MICROALBUR 7.0 (H) 10/29/2020  ? LDLCALC 147 (H) 07/07/2021  ? CREATININE 1.00 07/07/2021  ? ? ?Lab on 07/07/2021  ?Component Date Value Ref Range Status  ? Cholesterol 07/07/2021 228 (H)  0 - 200 mg/dL Final  ? ATP [name] Classification       Desirable:  < 200 mg/dL               Borderline High:  200 -  239 mg/dL          High:  > = 240 mg/dL  ? Triglycerides 07/07/2021 177.0 (H)  0.0 - 149.0 mg/dL Final  ? Normal:  <150 mg/dLBorderline High:  150 - 199 mg/dL  ? HDL 07/07/2021 45.40  >39.00 mg/dL Final  ? VLDL 07/07/2021 35.4  0.0 - 40.0 mg/dL Final  ? LDL Cholesterol 07/07/2021 147 (H)  0 - 99 mg/dL Final  ? Total CHOL/HDL Ratio 07/07/2021 5   Final  ?                Men          Women1/2 Average Risk     3.4          3.3Average Risk          5.0          4.42X Average Risk          9.6          7.13X Average Risk          15.0          11.0                      ? NonHDL 07/07/2021 182.67   Final  ? NOTE:  Non-HDL goal should be 30 mg/dL higher than patient's LDL goal (i.e. LDL goal of < 70 mg/dL, would have non-HDL goal of < 100 mg/dL)  ? Sodium 07/07/2021 136  135 - 145 mEq/L Final  ? Potassium 07/07/2021 4.9  3.5 - 5.1 mEq/L Final  ? Chloride 07/07/2021 100  96 - 112 mEq/L Final  ? CO2 07/07/2021 28  19 - 32 mEq/L Final  ? Glucose, Bld 07/07/2021 247 (H)  70 - 99 mg/dL Final  ? BUN 07/07/2021 22  6 - 23 mg/dL Final  ? Creatinine, Ser 07/07/2021 1.00  0.40 - 1.50 mg/dL Final  ? GFR 07/07/2021 74.82  >60.00 mL/min Final  ? Calculated using the CKD-EPI Creatinine Equation (2021)  ? Calcium 07/07/2021 9.2  8.4 - 10.5 mg/dL Final  ? Hgb A1c MFr Bld 07/07/2021 9.0 (H)  4.6 - 6.5 % Final  ? Glycemic Control Guidelines for People with Diabetes:Non Diabetic:  <6%Goal of Therapy: <7%Additional Action Suggested:  >8%   ? ? ? ? ?Allergies as of  07/10/2021   ? ?   Reactions  ? Actos [pioglitazone] Swelling  ? Bee Venom Swelling  ? Codeine Hives, Nausea Only  ? Statins Other (See Comments)  ? Muscle pain, fatigue  ? Glucophage [metformin Hcl] Nausea Only  ? Raspberry Itching  ? ?  ? ?  ?Medication List  ?  ? ?  ? Accurate as of July 10, 2021  8:31 PM. If you have any questions, ask your nurse or doctor.  ?  ?  ? ?  ? ?aspirin EC 81 MG tablet ?Take 81 mg by mouth daily. Swallow whole. ?  ?BD Veo Insulin Syringe U/F 31G X  15/64" 0.5 ML Misc ?Generic drug: Insulin Syringe-Needle U-100 ?USE TO INJECT INSULIN 3 TIMES A DAY. REFILLABLE ONLY WITH FOLLOW UP ?  ?bimatoprost 0.01 % Soln ?Commonly known as: LUMIGAN ?Place 1 drop into the left eye at bedtime. ?  ?Dexcom G6 Receiver Kerrin Mo ?Use to receive data from sensor ?  ?Dexcom G6 Sensor Misc ?Use to monitor blood sugar, change after 10 days ?  ?furosemide 40 MG tablet ?Commonly known as: LASIX ?Take 40 mg by mouth. ?  ?insulin NPH Human 100 UNIT/ML injection ?Commonly known as: NovoLIN N ?INJECT 22 UNITS IN THE SKIN DAILY AT BEDTIME. ?  ?latanoprost 0.005 % ophthalmic solution ?Commonly known as: XALATAN ?SMARTSIG:1 Drop(s) Left Eye Every Evening ?  ?lisinopril 5 MG tablet ?Commonly known as: ZESTRIL ?TAKE 1 TABLET BY MOUTH EVERY DAY ?  ?metoprolol succinate 50 MG 24 hr tablet ?Commonly known as: TOPROL-XL ?Take 1 tablet (50 mg total) by mouth daily. Take with or immediately following a meal. ?  ?metoprolol tartrate 25 MG tablet ?Commonly known as: LOPRESSOR ?Take 25 mg by mouth 2 (two) times daily. ?  ?neomycin-polymyxin-hydrocortisone OTIC solution ?Commonly known as: CORTISPORIN ?Place 3 drops into the left ear 3 (three) times daily. ?  ?NovoLOG 100 UNIT/ML injection ?Generic drug: insulin aspart ?INJECT 15-16 UNITS IN THE SKIN 3 TIMES DAILY ?  ?OneTouch Verio test strip ?Generic drug: glucose blood ?USE AS DIRECTED AS NEEDED ?  ?POTASSIUM PO ?Take by mouth. ?  ?Praluent 75 MG/ML Soaj ?Generic drug: Alirocumab ?Inject 75 mg into the skin every 14 (fourteen) days. ?  ? ?  ? ? ?Allergies:  ?Allergies  ?Allergen Reactions  ? Actos [Pioglitazone] Swelling  ? Bee Venom Swelling  ? Codeine Hives and Nausea Only  ? Statins Other (See Comments)  ?  Muscle pain, fatigue  ? Glucophage [Metformin Hcl] Nausea Only  ? Raspberry Itching  ? ? ?Past Medical History:  ?Diagnosis Date  ? Anemia   ? Arthritis   ? "back, left ankle" (11/20/2015)  ? Cataract   ? CHF (congestive heart failure) ([place])   ?  Chronic lower back pain   ? Coronary artery disease   ? a. 05/2009 CABG x 3: LIMA->LAD, VG->OM, VG->PDA; b. Nuc 01/2014: inf-lat scar but no ischemia, EF 57%.  ? Diabetic retinopathy ([place])   ? mild- Dr. Herbert Deaner  ? Glau

## 2021-07-11 ENCOUNTER — Other Ambulatory Visit: Payer: Self-pay | Admitting: Endocrinology

## 2021-07-11 DIAGNOSIS — E1165 Type 2 diabetes mellitus with hyperglycemia: Secondary | ICD-10-CM

## 2021-07-20 DIAGNOSIS — E1165 Type 2 diabetes mellitus with hyperglycemia: Secondary | ICD-10-CM | POA: Diagnosis not present

## 2021-08-06 ENCOUNTER — Other Ambulatory Visit: Payer: Self-pay | Admitting: Endocrinology

## 2021-08-10 ENCOUNTER — Other Ambulatory Visit: Payer: Self-pay | Admitting: Family Medicine

## 2021-08-11 ENCOUNTER — Encounter: Payer: Self-pay | Admitting: Cardiology

## 2021-08-11 ENCOUNTER — Other Ambulatory Visit: Payer: Self-pay

## 2021-08-11 DIAGNOSIS — E1165 Type 2 diabetes mellitus with hyperglycemia: Secondary | ICD-10-CM

## 2021-08-11 MED ORDER — "BD VEO INSULIN SYRINGE U/F 31G X 15/64"" 0.5 ML MISC": 3 refills | Status: DC

## 2021-08-12 ENCOUNTER — Ambulatory Visit: Payer: Medicare HMO | Admitting: Endocrinology

## 2021-08-12 DIAGNOSIS — L821 Other seborrheic keratosis: Secondary | ICD-10-CM | POA: Diagnosis not present

## 2021-08-12 DIAGNOSIS — D485 Neoplasm of uncertain behavior of skin: Secondary | ICD-10-CM | POA: Diagnosis not present

## 2021-08-12 DIAGNOSIS — L570 Actinic keratosis: Secondary | ICD-10-CM | POA: Diagnosis not present

## 2021-08-14 MED ORDER — FUROSEMIDE 40 MG PO TABS: 40.0000 mg | ORAL_TABLET | Freq: Every day | ORAL | 1 refills | Status: DC | PRN

## 2021-08-18 ENCOUNTER — Ambulatory Visit: Payer: Medicare HMO | Admitting: Endocrinology

## 2021-08-18 ENCOUNTER — Encounter: Payer: Self-pay | Admitting: Endocrinology

## 2021-08-18 VITALS — BP 128/78 | HR 88 | Ht 66.0 in | Wt 199.0 lb

## 2021-08-18 DIAGNOSIS — E1165 Type 2 diabetes mellitus with hyperglycemia: Secondary | ICD-10-CM

## 2021-08-18 DIAGNOSIS — Z794 Long term (current) use of insulin: Secondary | ICD-10-CM | POA: Diagnosis not present

## 2021-08-18 MED ORDER — INSULIN DETEMIR 100 UNIT/ML ~~LOC~~ SOLN: SUBCUTANEOUS | 3 refills | Status: DC

## 2021-08-18 NOTE — Progress Notes (Signed)
Patient ID: Matthew [name], male   DOB: 1945-06-04, 76 y.o.   MRN: [id number] ? ?       ? ? ?Reason for Appointment: Followup for Type 2 Diabetes ? ? ? ?History of Present Illness:  ?        ?Diagnosis: Type 2 diabetes mellitus, date of diagnosis:  1992     ? ?Past history: His blood sugar was high at diagnosis when he was having a routine screening done. He was started on Glucophage initially which he took for about a year. He thinks it did not help his sugar much and he did not feel good with it ?Apparently he was trying to control his diabetes with diet and exercise for a few years. He may have tried Glucophage again before going on insulin. Also was started on Actos which caused swelling ?He has been treated mostly with premixed insulin for about 15 years and his level of control appears to be inadequate although details of previous treatment are not available. He may have taken Humalog at one time for use with high sugars ?A1c was 11.5 in 2011 ?On his initial consultation in 9/15 because of poor control with premixed insulin he was switched to Levemir twice a day and Humalog with meals. ? ?Recent history:  ? ?INSULIN regimen is described as:20 N at 11 pm ; Novolog 15-16 units with meals  ? ?  ?Current management, blood sugar patterns, current level of control and problems: ? ?His A1c is last 9% ? ? ?He was able to start using the Dexcom sensor and is here for short-term follow-up to evaluate this  ?He thinks with using the Dexcom sensor he is able to adjust his NovoLog better ?He is likely taking less insulin at times when he is eating smaller meals ?Also he thinks he is taking only 3 or 4 units for correcting high blood sugars including late at night which has reduced some of his low sugars that he was having  ?However appears that he is forgetting his nighttime NPH insulin at least once or twice a week as he falls asleep and that his blood sugars will be well over 250 ?He is also concerned that sometimes  he is having HYPOGLYCEMIA which is mild overnight including around 3 AM or early morning ?POSTPRANDIAL blood sugars are sporadically higher either after lunch or dinner but only about one third of the time and less often during the day ?He is mostly adjusting the dose based on size of the meal but not content and recently with eating just chips and cheese and she is his blood sugars went up significantly when he took a coverage of only 10 units ?Again even with taking NPH only once a day his blood sugars before dinner are not high ?Recent GMI is 7.2 from his sensor ?As before he is unable to exercise ?  ? ?With hypoglycemia he has symptoms of weakness, sweating, shakiness and confusion ?Treating low sugars with orange juice    ?   ?Oral hypoglycemic drugs the patient is taking are: None    ?   ?Side effects from medications have been: Edema from Actos,? Nausea and malaise from metformin, polyuria from [place] ? ?Compliance with the medical regimen: Fair ? ?Interpretation of the CGM from [facility] for the last 2 weeks is as follows ? ?Moderate variability is present with standard deviation 55 ?HYPOGLYCEMIC episodes are occurring either overnight or late evening with no consistent pattern ?OVERNIGHT blood sugars are mostly  higher in the second week of his data compared to the first week but usually are improved by early morning; at night blood sugars have been low 3 AM on 3 occasions and once around 8 AM ?Premeal blood sugars are variable at breakfast and averaging about 140 before dinner ?POSTPRANDIAL blood sugars are not consistent with periodic rise over 200 at least one third of the time and highest blood sugars are about 275 ?Hypoglycemia as above is only occurring in the first week of his data overnight timeframe and less recently, occasionally blood sugars may be low normal late afternoon ? ?CGM use % of time 99  ?2-week average/GV 161/34  ?Time in range       64%  ?% Time Above 180 26+8  ?% Time above 250   ?%  Time Below 70 2  ? ?  ? ? ?Self-care:   ?Meals: 2-3 meals per day.  Bfst 8:30-9, 1 pm Dinner is about 8 PM.  Some meals are high fat      ? ? ?Dietician visit, most recent:?  15 years ago.  ?    ?CDE visit: 3/16           ? ?Weight history: ? ?Wt Readings from Last 3 Encounters:  ?08/18/21 199 lb (90.3 kg)  ?07/10/21 193 lb 12.8 oz (87.9 kg)  ?04/03/21 197 lb 12.8 oz (89.7 kg)  ? ? ?Glycemic control: ?  ?Lab Results  ?Component Value Date  ? HGBA1C 9.0 (H) 07/07/2021  ? HGBA1C 9.0 (H) 03/31/2021  ? HGBA1C 8.3 (H) 10/29/2020  ? ?Lab Results  ?Component Value Date  ? MICROALBUR 7.0 (H) 10/29/2020  ? LDLCALC 147 (H) 07/07/2021  ? CREATININE 1.00 07/07/2021  ? ? ?No visits with results within 1 Week(s) from this visit.  ?Latest known visit with results is:  ?Lab on 07/07/2021  ?Component Date Value Ref Range Status  ? Cholesterol 07/07/2021 228 (H)  0 - 200 mg/dL Final  ? ATP [name] Classification       Desirable:  < 200 mg/dL               Borderline High:  200 - 239 mg/dL          High:  > = 240 mg/dL  ? Triglycerides 07/07/2021 177.0 (H)  0.0 - 149.0 mg/dL Final  ? Normal:  <150 mg/dLBorderline High:  150 - 199 mg/dL  ? HDL 07/07/2021 45.40  >39.00 mg/dL Final  ? VLDL 07/07/2021 35.4  0.0 - 40.0 mg/dL Final  ? LDL Cholesterol 07/07/2021 147 (H)  0 - 99 mg/dL Final  ? Total CHOL/HDL Ratio 07/07/2021 5   Final  ?                Men          Women1/2 Average Risk     3.4          3.3Average Risk          5.0          4.42X Average Risk          9.6          7.13X Average Risk          15.0          11.0                      ? NonHDL 07/07/2021 182.67   Final  ? NOTE:  Non-HDL goal  should be 30 mg/dL higher than patient's LDL goal (i.e. LDL goal of < 70 mg/dL, would have non-HDL goal of < 100 mg/dL)  ? Sodium 07/07/2021 136  135 - 145 mEq/L Final  ? Potassium 07/07/2021 4.9  3.5 - 5.1 mEq/L Final  ? Chloride 07/07/2021 100  96 - 112 mEq/L Final  ? CO2 07/07/2021 28  19 - 32 mEq/L Final  ? Glucose, Bld 07/07/2021 247 (H)  70  - 99 mg/dL Final  ? BUN 07/07/2021 22  6 - 23 mg/dL Final  ? Creatinine, Ser 07/07/2021 1.00  0.40 - 1.50 mg/dL Final  ? GFR 07/07/2021 74.82  >60.00 mL/min Final  ? Calculated using the CKD-EPI Creatinine Equation (2021)  ? Calcium 07/07/2021 9.2  8.4 - 10.5 mg/dL Final  ? Hgb A1c MFr Bld 07/07/2021 9.0 (H)  4.6 - 6.5 % Final  ? Glycemic Control Guidelines for People with Diabetes:Non Diabetic:  <6%Goal of Therapy: <7%Additional Action Suggested:  >8%   ? ? ? ? ?Allergies as of 08/18/2021   ? ?   Reactions  ? Actos [pioglitazone] Swelling  ? Bee Venom Swelling  ? Codeine Hives, Nausea Only  ? Statins Other (See Comments)  ? Muscle pain, fatigue  ? Glucophage [metformin Hcl] Nausea Only  ? Raspberry Itching  ? ?  ? ?  ?Medication List  ?  ? ?  ? Accurate as of August 18, 2021  4:06 PM. If you have any questions, ask your nurse or doctor.  ?  ?  ? ?  ? ?STOP taking these medications   ? ?bimatoprost 0.01 % Soln ?Commonly known as: LUMIGAN ?Stopped by: Elayne Snare, MD ?  ?neomycin-polymyxin-hydrocortisone OTIC solution ?Commonly known as: CORTISPORIN ?Stopped by: Elayne Snare, MD ?  ?Praluent 75 MG/ML Soaj ?Generic drug: Alirocumab ?Stopped by: Elayne Snare, MD ?  ? ?  ? ?TAKE these medications   ? ?aspirin EC 81 MG tablet ?Take 81 mg by mouth daily. Swallow whole. ?  ?BD Veo Insulin Syringe U/F 31G X 15/64" 0.5 ML Misc ?Generic drug: Insulin Syringe-Needle U-100 ?Use to inject 4 times a day ?  ?Dexcom G6 Receiver Devi ?Use to receive data from sensor ?  ?Dexcom G6 Sensor Misc ?Use to monitor blood sugar, change after 10 days ?  ?furosemide 40 MG tablet ?Commonly known as: LASIX ?Take 1 tablet (40 mg total) by mouth daily as needed. ?  ?insulin NPH Human 100 UNIT/ML injection ?Commonly known as: NovoLIN N ?INJECT 22 UNITS IN THE SKIN DAILY AT BEDTIME. ?  ?latanoprost 0.005 % ophthalmic solution ?Commonly known as: XALATAN ?SMARTSIG:1 Drop(s) Left Eye Every Evening ?  ?lisinopril 5 MG tablet ?Commonly known as: ZESTRIL ?TAKE  1 TABLET BY MOUTH EVERY DAY ?  ?metoprolol succinate 50 MG 24 hr tablet ?Commonly known as: TOPROL-XL ?Take 1 tablet (50 mg total) by mouth daily. Take with or immediately following a meal. ?  ?metopr

## 2021-08-19 DIAGNOSIS — E1165 Type 2 diabetes mellitus with hyperglycemia: Secondary | ICD-10-CM | POA: Diagnosis not present

## 2021-08-22 ENCOUNTER — Encounter: Payer: Self-pay | Admitting: Endocrinology

## 2021-09-08 ENCOUNTER — Telehealth: Payer: Self-pay | Admitting: Family Medicine

## 2021-09-08 DIAGNOSIS — R600 Localized edema: Secondary | ICD-10-CM

## 2021-09-08 NOTE — Telephone Encounter (Signed)
Patient called stating that he needs his Potassium refilled. He stated that he doesn't think Dr Carlota Raspberry has ever filled it for him and I confirmed that he has not. Also let him know that it's been over a year since he has been seen and per policy we can not refill anything without a visit. He stated that he did not want to come in and pay a copay just to have potassium sent him. I did apologize and let him know that since he hasn't been seen in over a year he would need to be seen. He did ask that I send a message to Dr Carlota Raspberry anyway and that he would like a call from him ?

## 2021-09-09 ENCOUNTER — Other Ambulatory Visit: Payer: Self-pay

## 2021-09-09 MED ORDER — POTASSIUM CHLORIDE ER 10 MEQ PO TBCR: 10.0000 meq | EXTENDED_RELEASE_TABLET | Freq: Every day | ORAL | 1 refills | Status: DC

## 2021-09-09 NOTE — Addendum Note (Signed)
Addended by: Merri Ray R on: 09/09/2021 12:57 PM ? ? Modules accepted: Orders ? ?

## 2021-09-09 NOTE — Telephone Encounter (Signed)
Called pt and was able to get him to schedule for June 29th for physical. Pt verified he is taking the Potassium 10 mEq if you would send enough to get to the appointment.  ?

## 2021-09-09 NOTE — Telephone Encounter (Signed)
Ordered

## 2021-09-09 NOTE — Telephone Encounter (Signed)
He is due for follow-up with me.  If we can schedule him a physical in the next month we could temporarily refill potassium as he has ongoing follow-up with endocrinology including updated labs.  However I see 2 prior doses for potassium, 20 mEq and 10 mEq.  Please verify dose and I can potentially send a short-term supply until he is able to be seen. ?

## 2021-09-10 ENCOUNTER — Other Ambulatory Visit: Payer: Self-pay | Admitting: Endocrinology

## 2021-09-18 DIAGNOSIS — E1165 Type 2 diabetes mellitus with hyperglycemia: Secondary | ICD-10-CM | POA: Diagnosis not present

## 2021-10-02 DIAGNOSIS — H43822 Vitreomacular adhesion, left eye: Secondary | ICD-10-CM | POA: Diagnosis not present

## 2021-10-02 DIAGNOSIS — H40052 Ocular hypertension, left eye: Secondary | ICD-10-CM | POA: Diagnosis not present

## 2021-10-02 DIAGNOSIS — H40021 Open angle with borderline findings, high risk, right eye: Secondary | ICD-10-CM | POA: Diagnosis not present

## 2021-10-02 DIAGNOSIS — H35033 Hypertensive retinopathy, bilateral: Secondary | ICD-10-CM | POA: Diagnosis not present

## 2021-10-02 DIAGNOSIS — H4032X1 Glaucoma secondary to eye trauma, left eye, mild stage: Secondary | ICD-10-CM | POA: Diagnosis not present

## 2021-10-08 ENCOUNTER — Other Ambulatory Visit: Payer: Self-pay | Admitting: Family Medicine

## 2021-10-18 DIAGNOSIS — E1165 Type 2 diabetes mellitus with hyperglycemia: Secondary | ICD-10-CM | POA: Diagnosis not present

## 2021-10-21 ENCOUNTER — Other Ambulatory Visit: Payer: Medicare HMO

## 2021-10-21 DIAGNOSIS — G720 Drug-induced myopathy: Secondary | ICD-10-CM

## 2021-10-23 ENCOUNTER — Encounter: Payer: Self-pay | Admitting: Family Medicine

## 2021-10-23 ENCOUNTER — Ambulatory Visit: Payer: Medicare HMO | Admitting: Endocrinology

## 2021-10-23 ENCOUNTER — Ambulatory Visit (INDEPENDENT_AMBULATORY_CARE_PROVIDER_SITE_OTHER): Payer: Medicare HMO | Admitting: Family Medicine

## 2021-10-23 VITALS — BP 136/78 | HR 72 | Temp 98.1°F | Ht 66.0 in | Wt 195.8 lb

## 2021-10-23 DIAGNOSIS — R5383 Other fatigue: Secondary | ICD-10-CM | POA: Diagnosis not present

## 2021-10-23 DIAGNOSIS — R600 Localized edema: Secondary | ICD-10-CM

## 2021-10-23 DIAGNOSIS — R011 Cardiac murmur, unspecified: Secondary | ICD-10-CM | POA: Diagnosis not present

## 2021-10-23 DIAGNOSIS — H9212 Otorrhea, left ear: Secondary | ICD-10-CM

## 2021-10-23 DIAGNOSIS — R0609 Other forms of dyspnea: Secondary | ICD-10-CM

## 2021-10-23 DIAGNOSIS — Z794 Long term (current) use of insulin: Secondary | ICD-10-CM | POA: Diagnosis not present

## 2021-10-23 DIAGNOSIS — E1165 Type 2 diabetes mellitus with hyperglycemia: Secondary | ICD-10-CM

## 2021-10-23 DIAGNOSIS — M545 Low back pain, unspecified: Secondary | ICD-10-CM

## 2021-10-23 DIAGNOSIS — G8929 Other chronic pain: Secondary | ICD-10-CM

## 2021-10-23 LAB — CBC
HCT: 37.3 % — ABNORMAL LOW (ref 39.0–52.0)
Hemoglobin: 12.3 g/dL — ABNORMAL LOW (ref 13.0–17.0)
MCHC: 33 g/dL (ref 30.0–36.0)
MCV: 87.9 fl (ref 78.0–100.0)
Platelets: 251 10*3/uL (ref 150.0–400.0)
RBC: 4.24 Mil/uL (ref 4.22–5.81)
RDW: 14.8 % (ref 11.5–15.5)
WBC: 7.6 10*3/uL (ref 4.0–10.5)

## 2021-10-23 LAB — BASIC METABOLIC PANEL
BUN: 22 mg/dL (ref 6–23)
CO2: 29 mEq/L (ref 19–32)
Calcium: 9.4 mg/dL (ref 8.4–10.5)
Chloride: 102 mEq/L (ref 96–112)
Creatinine, Ser: 1.1 mg/dL (ref 0.40–1.50)
GFR: 66.6 mL/min (ref >60.00)
Glucose, Bld: 171 mg/dL — ABNORMAL HIGH (ref 70–99)
Potassium: 4.6 mEq/L (ref 3.5–5.1)
Sodium: 136 mEq/L (ref 135–145)

## 2021-10-23 LAB — BRAIN NATRIURETIC PEPTIDE: Pro B Natriuretic peptide (BNP): 223 pg/mL — ABNORMAL HIGH (ref 0.0–100.0)

## 2021-10-23 LAB — TSH: TSH: 2.24 u[IU]/mL (ref 0.35–5.50)

## 2021-10-23 NOTE — Patient Instructions (Addendum)
Contact endocrinology right away about the recent high readings as those can be dangerous and may be contributing to fatigue as well. I will check some labs today.   Ok to try furosemide to help with leg swelling to see if that helps. Discuss your fatigue with cardiology as well - please  contact for appointment soon.  I am checking some blood work including heart failure test, blood counts and thyroid test today as well as your electrolytes.    There is no discharge from your left ear or significant cerumen/wax.  Please avoid use of cotton tip swabs to the ear canal and if any persistent crusting please return for recheck.  I will refer you to a back specialist to decide on next step which may include physical therapy or possible injections.  Return to the clinic or go to the nearest emergency room if any of your symptoms worsen or new symptoms occur.

## 2021-10-23 NOTE — Progress Notes (Signed)
Subjective:  Patient ID: Matthew [name], male    DOB: 06/26/1945  Age: 76 y.o. MRN: [id number]  CC:  Chief Complaint  Patient presents with   Back Pain    Pt reports continued back pain, brought an old MRI of the back reports has been fatigued, pain walking distances, has been causing issues notes quality of life changes    Ear Drainage    Pt notes a crust in his LT ear in the mornings, notes this is not waxy, no decreased hearing     HPI Matthew [name] presents for   Back pain: Chronic low back pain for years. Treated by chiropractor previously, was told about 5 years ago after MRI that should see neurosurgeon. Did not want to have surgery due to concerns of outcomes. Had 2 injections in sacrum in past in 80's, and again at [place] in 90's.  none recent. Pain with standing, better leaning with leaning forward over shopping cart.  Tx: asa '81mg'$  BID for heart. Occasional additional ASA for pain, not daily.  No bowel or bladder incontinence, no saddle anesthesia, no lower extremity weakness. Some fatigue with walking.  Seminoma stage 2 testicular CA in 1982, with radiation treatment - concern that deterioration may have contributed.   He brings a copy of MRI of his lumbar spine from 10-17, Dr. Gladstone Lighter with [facility] orthopedics.  MRI lumbar spine:  Multiple degenerative changes of the lumbar spine, multilevel most prominent L5-S1 and L4-5.  At L5-S1 severe right neural foramina narrowing with encroachment of the right L5 dorsal root ganglion.  At L4-5 disc bulge with a superimposed small left posterior lateral left foraminal cranial disc extrusion which contacts but does not displace the left L5 root.  Moderate to severe left neuroforaminal narrowing with probable encroachment on the left L4 dorsal root ganglion.  At L3-4 disc bulge which contacts but does not displace the right L4 root.  Small left foraminal disc protrusion which contacts but does not space at left  L3 dorsal root ganglion.  At L2-3 advanced degenerative disc disease with a posterior disc osteophyte complex which may be displaces the left L3 root.  At L1-2 advanced degenerative disc disease and solid osseous bridging across a portion of the right sacroiliac joint likely degenerative in origin.  Spondylosis with minimal retrolisthesis of C3 on C4 and spondylotic change of C3-4 on C-spine imaging in April 2008.    Fatigue With activity. Overall fatigue. Trouble lifting heavy objects due to pain and feels weak. No chest pain but winded with activity - chronic, no recent changes - has appt with cardiology in [place]. No cough. No recent palpitations. Some increased leg edema at times - on and off for years. Lasix few times per month with  36mq potassium, not recent.  Followed by endocrinology for diabetes. Some high readings up to 400 recently. 369 today. Recently changed to levemir.  No n/v/abd pain. No new blurry vision, followed by optho for PVD.   L ear crusting: Present for months. No pain. Not waxy. No change in hearing - some difficulty with hearing chronically, but no changes. No bleeding.  No treatments.  Swimmer's ear few years ago - different.  Uses Qtips occasionally.   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 05/2009 CABG x 3: LIMA->LAD, VG->OM, VG->PDA; b. Nuc 01/2014: inf-lat scar but no ischemia, EF 57%.   Diabetic retinopathy (HCC)    mild- Dr. Herbert Deaner   Glaucoma    Hyperlipidemia    a. Intolerant of lipitor and vytorin.   Lyme disease    Migraine    "once or twice" (11/20/2015)   Myocardial infarction [facility])    'saw evidence of it on an EKG done in 2005"   Paroxysmal atrial flutter ([place])    a. 01/2014 s/p DCCV;  b. CHA2DS2VASc = 3-->Eliquis.   Pneumonia 1960   Sacral pain    "right"   Sciatica of right side    Testicular cancer ([place]) 1982   "heavy doses of radiation; it was stage 2"   Type II diabetes mellitus ([place])    Valvular heart disease    a. 01/2014 Echo: Ef 60-65%, no rwma, mild AI/MS, mod MR, mildly dil LA.   Past Surgical History:  Procedure Laterality Date   ABDOMINAL EXPLORATION SURGERY  ~ 2005   "for hernia, but didn't have one"   ATRIAL FIBRILLATION ABLATION N/A 09/17/2016   Procedure: Atrial Fibrillation Ablation;  Surgeon: [name], MD;  Location: [place] [facility];  Service: Cardiovascular;  Laterality: N/A;   CARDIAC CATHETERIZATION  2011   CARDIOVERSION N/A 02/19/2014   Procedure: CARDIOVERSION;  Surgeon: [name], MD;  Location: [place];  Service: Cardiovascular;  Laterality: N/A;   CARDIOVERSION N/A 10/28/2015   Procedure: CARDIOVERSION;  Surgeon: [name], MD;  Location: [place];  Service: Cardiovascular;  Laterality: N/A;   CARDIOVERSION  11/20/2015   "200 joules"   CORONARY ARTERY BYPASS GRAFT  05/2009   LIMA-LAD, SVG-OM, SVG-PDA 06/20/09   ELBOW FRACTURE SURGERY Left    broken ulna on left elbow-surgical repair   ELECTROPHYSIOLOGIC STUDY N/A 10/31/2014   Procedure: A-Flutter;  Surgeon: [name], MD;  Location: [place] [facility];  Service: Cardiovascular;  Laterality: N/A;    ELECTROPHYSIOLOGIC STUDY N/A 11/20/2015   Procedure: A-Flutter Ablation;  Surgeon: [name], MD;  Location: [place] [facility];  Service: Cardiovascular;  Laterality: N/A;   EYE SURGERY     FRACTURE SURGERY     TESTICLE REMOVAL Right 1982   TONSILLECTOMY  ~ 1967   Allergies  Allergen Reactions   Actos [Pioglitazone] Swelling   Bee Venom Swelling   Codeine Hives and Nausea Only   Statins Other (See Comments)    Muscle pain, fatigue   Glucophage [Metformin Hcl] Nausea Only   Raspberry Itching   Prior to Admission medications   Medication Sig Start Date End Date Taking? Authorizing Provider  aspirin EC 81 MG tablet Take 81 mg by mouth daily. Swallow whole.   Yes [provider]  Continuous Blood Gluc Receiver (DEXCOM G6 RECEIVER) DEVI Use to receive data from  sensor 05/23/21  Yes Elayne Snare, MD  Continuous Blood Gluc Sensor (DEXCOM G6 SENSOR) MISC Use to monitor blood sugar, change after 10 days 05/23/21  Yes Elayne Snare, MD  furosemide (LASIX) 40 MG tablet Take 1 tablet (40 mg total) by mouth daily as needed. 08/14/21  Yes Camnitz, Will Hassell Done, MD  insulin detemir (LEVEMIR) 100 UNIT/ML injection Take 22 units hs daily 08/18/21  Yes Elayne Snare, MD  Insulin Syringe-Needle U-100 (BD VEO INSULIN SYRINGE U/F) 31G X 15/64" 0.5 ML MISC Use to inject 4 times a day 08/11/21  Yes Elayne Snare, MD  latanoprost (XALATAN) 0.005 % ophthalmic solution SMARTSIG:1 Drop(s) Left Eye Every Evening 04/01/21  Yes [provider]  lisinopril (ZESTRIL) 5 MG tablet TAKE 1 TABLET BY MOUTH EVERY DAY 02/19/21  Yes Camnitz, Ocie Doyne, MD  metoprolol tartrate (LOPRESSOR) 25 MG tablet Take 25 mg by mouth 2 (two) times daily.   Yes [provider]  NOVOLOG 100 UNIT/ML injection INJECT 15-16 UNITS IN THE SKIN 3 TIMES DAILY 09/11/21  Yes Elayne Snare, MD  [facility] VERIO test strip USE AS DIRECTED AS NEEDED 03/24/20  Yes Elayne Snare, MD  potassium chloride (KLOR-CON 10) 10 MEQ tablet Take 1  tablet (10 mEq total) by mouth daily. 09/09/21  Yes Wendie Agreste, MD  metoprolol succinate (TOPROL-XL) 50 MG 24 hr tablet Take 1 tablet (50 mg total) by mouth daily. Take with or immediately following a meal. 12/16/20 03/16/21  Camnitz, Ocie Doyne, MD   Social History   Socioeconomic History   Marital status: Married    Spouse name: Not on file   Number of children: Not on file   Years of education: Not on file   Highest education level: Not on file  Occupational History   Not on file  Tobacco Use   Smoking status: Former    Packs/day: 0.00    Years: 15.00    Total pack years: 0.00    Types: Cigarettes    Quit date: 81    Years since quitting: 33.5   Smokeless tobacco: Never   Tobacco comments:    "quit smoking cigarettes in the 1980's; don't know how much or for how long"  Vaping Use   Vaping Use: Never used  Substance and Sexual Activity   Alcohol use: Yes    Alcohol/week: 3.0 standard drinks of alcohol    Types: 3 Cans of beer per week   Drug use: No    Comment: "quit smoking pot in the 1990s"   Sexual activity: Not Currently    Birth control/protection: Coitus interruptus  Other Topics Concern   Not on file  Social History Narrative   Not on file   Social Determinants of Health   Financial Resource Strain: Not on file  Food Insecurity: Not on file  Transportation Needs: Not on file  Physical Activity: Not on file  Stress: Not on file  Social Connections: Not on file  Intimate Partner Violence: Not on file    Review of Systems Per HPI.   Objective:   Vitals:   10/23/21 1057  BP: 136/78  Pulse: 72  Temp: 98.1 F (36.7 C)  TempSrc: Oral  SpO2: 97%  Weight: 195 lb 12.8 oz (88.8 kg)  Height: '5\' 6"'$  (1.676 m)     Physical Exam Vitals reviewed.  Constitutional:      Appearance: He is well-developed.  HENT:     Head: Normocephalic and atraumatic.     Right Ear: Tympanic membrane, ear canal  and external ear normal.     Left Ear: Tympanic  membrane, ear canal and external ear normal. There is no impacted cerumen.  Neck:     Vascular: No carotid bruit or JVD.  Cardiovascular:     Rate and Rhythm: Normal rate and regular rhythm.     Heart sounds: Murmur (2/6 systolic, left upper sternal border.) heard.  Pulmonary:     Effort: Pulmonary effort is normal.     Breath sounds: No rales.     Comments: Few coarse breath sounds at bases, possible rales.  Normal effort, no distress. Musculoskeletal:     Right lower leg: Edema (1+ pitting edema to mid tibia.  Skin intact.  No rash) present.     Left lower leg: Edema present.     Comments: Able to walk without difficulty, negative seated straight leg raise.  No focal bony tenderness along lumbar spine  Skin:    General: Skin is warm and dry.  Neurological:     Mental Status: He is alert and oriented to person, place, and time.  Psychiatric:        Mood and Affect: Mood normal.        Behavior: Behavior normal.    50 minutes spent during visit, including chart review, MRI review. counseling and assimilation of information, exam, discussion of additional symptoms, discussion of plan, and chart completion.   Assessment & Plan:  Matthew [name] is a 76 y.o. male . Chronic low back pain without sciatica, unspecified back pain laterality - Plan: Ambulatory referral to Orthopedic Surgery  -Multiple areas of degenerative disc disease as above.  Suspect component of spinal stenosis.  -No red flags on exam or history.  Will refer to back specialist to decide on updated imaging versus trial of physical therapy versus epidural spinal injection option.   Heart murmur Fatigue, unspecified type - Plan: Basic metabolic panel, Brain natriuretic peptide, Ambulatory referral to Orthopedic Surgery, CBC, TSH Pedal edema DOE (dyspnea on exertion) Type 2 diabetes mellitus with hyperglycemia, with long-term current use of insulin (HCC)  -Fatigue may be multifactorial. Concerning hyperglycemia, but  denies symptoms.  Check BMP to evaluate glucose and bicarb, advised he call his endocrinologist today to discuss medication changes and plan.  -Possible faint rales with pedal edema, recommended he take his furosemide and we will check a BMP.  Also recommended discussing his fatigue with cardiology and follow-up of murmur.  -Check TSH,CBC. ER/rtc precautions given.   Ear discharge of left ear  - ear clear on exam. Avoid cotton tipped swabs. Rtc if sx's recur.    No orders of the defined types were placed in this encounter.  Patient Instructions  Contact endocrinology right away about the recent high readings as those can be dangerous and may be contributing to fatigue as well. I will check some labs today.   Ok to try furosemide to help with leg swelling to see if that helps. Discuss your fatigue with cardiology as well - please  contact for appointment soon.  I am checking some blood work including heart failure test, blood counts and thyroid test today as well as your electrolytes.    There is no discharge from your left ear or significant cerumen/wax.  Please avoid use of cotton tip swabs to the ear canal and if any persistent crusting please return for recheck.  I will refer you to a back specialist to decide on next step which may include physical therapy or possible injections.  Return  to the clinic or go to the nearest emergency room if any of your symptoms worsen or new symptoms occur.      Signed,   Merri Ray, MD [place], [facility] 10/23/21 12:55 PM

## 2021-11-01 ENCOUNTER — Other Ambulatory Visit: Payer: Self-pay | Admitting: Endocrinology

## 2021-11-02 NOTE — Progress Notes (Unsigned)
Cardiology Office Note Date:  11/02/2021  Patient ID:  Matthew [name], DOB [birth year], MRN [id number] PCP:  Wendie Agreste, MD  Cardiologist:  Dr. Curt Bears    Chief Complaint: *** 9 mo  History of Present Illness: Matthew [name] is a 76 y.o. male with history of CAD hx of CABG 2011, AFlutter ablated, recurrent AFlutter with atypical morphology >> EPS noted LA flutter his atrial flutter isthmus was blocked, initially on Tikosyn with prolongation of his QT eventually on amiodarone, DM.    He comes in today to be seen for Dr. Curt Bears, last seen by him Aug 2022, doing well, no symptoms of arrhythmia. With no symptoms of AF/AFlutter, his Eliquis stopped, and BB changed to Toprol Pt declined Repatha.  Saw his PMD 10/23/21, minimal use of PRN lasix, following with endo for DM ooc. Struggling with chronic back pain, fatigue Was edematous and advised to use his lasix and f/u with cardiology, discuss fatigue further. Planned for labs BNP was 223  *** symptoms *** labs, lipids *** meds, CAD, statin intolerant...   AFlutter Hx: Ablation of typical AFlutter 10/31/14 EPS 11/20/15:  LA flutter > DCCV EPS/PVI ablation 09/17/2016 AAD Hx: July 2017 started on amiodarone > stopped April 2018 Hx of QT prolongation on Tikosyn   Past Medical History:  Diagnosis Date   Anemia    Arthritis    "back, left ankle" (11/20/2015)   Cataract    CHF (congestive heart failure) (HCC)    Chronic lower back pain    Coronary artery disease    a. 05/2009 CABG x 3: LIMA->LAD, VG->OM, VG->PDA; b. Nuc 01/2014: inf-lat scar but no ischemia, EF 57%.   Diabetic retinopathy (HCC)    mild- Dr. Herbert Deaner   Glaucoma    Hyperlipidemia    a. Intolerant of lipitor and vytorin.   Lyme disease    Migraine    "once or twice" (11/20/2015)   Myocardial infarction [facility])    'saw evidence of it on an EKG done in 2005"   Paroxysmal atrial flutter ([place])    a. 01/2014 s/p DCCV;  b. CHA2DS2VASc = 3-->Eliquis.    Pneumonia 1960   Sacral pain    "right"   Sciatica of right side    Testicular cancer ([place]) 1982   "heavy doses of radiation; it was stage 2"   Type II diabetes mellitus ([place])    Valvular heart disease    a. 01/2014 Echo: Ef 60-65%, no rwma, mild AI/MS, mod MR, mildly dil LA.    Past Surgical History:  Procedure Laterality Date   ABDOMINAL EXPLORATION SURGERY  ~ 2005   "for hernia, but didn't have one"   ATRIAL FIBRILLATION ABLATION N/A 09/17/2016   Procedure: Atrial Fibrillation Ablation;  Surgeon: [name], MD;  Location: [place] [facility];  Service: Cardiovascular;  Laterality: N/A;   CARDIAC CATHETERIZATION  2011   CARDIOVERSION N/A 02/19/2014   Procedure: CARDIOVERSION;  Surgeon: [name], MD;  Location: [place];  Service: Cardiovascular;  Laterality: N/A;   CARDIOVERSION N/A 10/28/2015   Procedure: CARDIOVERSION;  Surgeon: [name], MD;  Location: [place];  Service: Cardiovascular;  Laterality: N/A;   CARDIOVERSION  11/20/2015   "200 joules"   CORONARY ARTERY BYPASS GRAFT  05/2009   LIMA-LAD, SVG-OM, SVG-PDA 06/20/09   ELBOW FRACTURE SURGERY Left    broken ulna on left elbow-surgical repair   ELECTROPHYSIOLOGIC STUDY N/A 10/31/2014   Procedure: A-Flutter;  Surgeon: [name], MD;  Location: [place] [facility];  Service: Cardiovascular;  Laterality: N/A;   ELECTROPHYSIOLOGIC STUDY N/A 11/20/2015   Procedure: A-Flutter Ablation;  Surgeon: [name], MD;  Location: [place] [facility];  Service: Cardiovascular;  Laterality: N/A;   EYE SURGERY     FRACTURE SURGERY     TESTICLE REMOVAL Right 1982   TONSILLECTOMY  ~ 1967    Current Outpatient Medications  Medication Sig Dispense Refill   aspirin EC 81 MG tablet Take 81 mg by mouth daily. Swallow whole.     Continuous Blood Gluc Receiver (DEXCOM G6 RECEIVER) DEVI Use to receive data from sensor 1 each 0   Continuous Blood Gluc Sensor (DEXCOM G6 SENSOR) MISC Use to monitor blood sugar,  change after 10 days 3 each 3   furosemide (LASIX) 40 MG tablet Take 1 tablet (40 mg total) by mouth daily as needed. 90 tablet 1   insulin detemir (LEVEMIR) 100 UNIT/ML injection Take 22 units hs daily 10 mL 3   Insulin Syringe-Needle U-100 (BD VEO INSULIN SYRINGE U/F) 31G X 15/64" 0.5 ML MISC Use to inject 4 times a day 400 each 3   latanoprost (XALATAN) 0.005 % ophthalmic solution SMARTSIG:1 Drop(s) Left Eye Every Evening     lisinopril (ZESTRIL) 5 MG tablet TAKE 1 TABLET BY MOUTH EVERY DAY 90 tablet 3   metoprolol succinate (TOPROL-XL) 50 MG 24 hr tablet Take 1 tablet (50 mg total) by mouth daily. Take with or immediately following a meal. 90 tablet 3   metoprolol tartrate (LOPRESSOR) 25 MG tablet Take 25 mg by mouth 2 (two) times daily.     NOVOLOG 100 UNIT/ML injection INJECT 15-16 UNITS IN THE SKIN 3 TIMES DAILY 10 mL 1   ONETOUCH VERIO test strip USE AS DIRECTED AS NEEDED 100 strip 3   potassium chloride (KLOR-CON 10) 10 MEQ tablet Take 1 tablet (10 mEq total) by mouth daily. 30 tablet 1   No current facility-administered medications for this visit.    Allergies:   Actos [pioglitazone], Bee venom, Codeine, Statins, Glucophage [metformin hcl], and Raspberry   Social History:  The patient  reports that he quit smoking about 33 years ago. His smoking use included cigarettes. He has never used smokeless tobacco. He reports current alcohol use of about 3.0 standard drinks of alcohol per week. He reports that he does not use drugs.   Family History:  The patient's family history includes Diabetes in his father, mother, and sister; Heart attack in his father; Heart disease in his father and mother; Hypertension in his father.  ROS:  Please see the history of present illness.  All other systems are reviewed and otherwise negative.   PHYSICAL EXAM:  VS:  There were no vitals taken for this visit. BMI: There is no height or weight on file to calculate BMI. Well nourished, well developed, in  no acute distress  HEENT: normocephalic, atraumatic  Neck: no JVD, carotid bruits or masses Cardiac:  *** RRR; no significant murmurs, no rubs, or gallops Lungs:   *** CTA b/l, no wheezing, rhonchi or rales  Abd: soft, nontender MS: no deformity or atrophy Ext: *** trace-1+ edema b/l LE Skin: warm and dry, no rash Neuro:  No gross deficits appreciated Psych: euthymic mood, full affect   EKG:  Done today and reviewed by myself : *** 09/17/2016: EPS/ablation CONCLUSIONS: 1. Sinus rhythm upon presentation.   2. Successful electrical isolation and anatomical encircling of all four pulmonary veins with radiofrequency current. 3. No  inducible arrhythmias following ablation both on and off of Isuprel 4. No early apparent complications.  08/21/2016: TTE Study Conclusions  - Left ventricle: The cavity size was normal. Wall thickness was    increased in a pattern of mild LVH. Systolic function was normal.    The estimated ejection fraction was in the range of 55% to 60%.    Wall motion was normal; there were no regional wall motion    abnormalities. Features are consistent with a pseudonormal left    ventricular filling pattern, with concomitant abnormal relaxation    and increased filling pressure (grade 2 diastolic dysfunction).    Doppler parameters are consistent with high ventricular filling    pressure.  - Mitral valve: Severely calcified annulus. There was mild    regurgitation.  - Left atrium: The atrium was mildly dilated.   Impressions:  - Normal LV systolic function; moderate diastolic dysfunction with    elevated LV filling pressure; mild MR; mild LAE.    02/05/14: TTE Study Conclusions - Left ventricle: The cavity size was normal. There was mild focal   basal hypertrophy of the septum. Systolic function was normal.   The estimated ejection fraction was in the range of 60% to 65%.   Wall motion was normal; there were no regional wall motion   abnormalities. - Aortic  valve: Valve mobility was restricted. There was mild   regurgitation. Mean gradient (S): 7 mm Hg. Peak gradient (S): 12   mm Hg. Valve area (VTI): 2.03 cm^2. Valve area (Vmean): 2.02   cm^2. - Mitral valve: Calcified annulus. Mildly thickened leaflets . The   findings are consistent with mild stenosis. There was moderate   regurgitation. Valve area by continuity equation (using LVOT   flow): 2.29 cm^2. - Left atrium: The atrium was mildly dilated. - Right ventricle: Systolic function was mildly reduced. - Right atrium: The atrium was mildly dilated. Impressions: - Normal LV function; mild biatrial enlargement; thickened MV;   moderate MR; mild MS by mean gradient (6 mmHg); calcified aortic   valve but no significant AS by doppler; mild AI.  11/20/2015: EPS 1. LA flutter 2. Successful DCCV with 200 Joules of biphasic energy.  10/31/2014: EPS/ablation CONCLUSIONS:  1. Isthmus-dependent right atrial flutter upon presentation.  2. Successful radiofrequency ablation of atrial flutter along the cavotricuspid isthmus with complete bidirectional isthmus block achieved.  3. No inducible arrhythmias following ablation.  4. No early apparent complications.   02/06/14: stress myoview Impression Exercise Capacity:  Poor exercise capacity. BP Response:  Normal blood pressure response. Clinical Symptoms:  There is dyspnea. ECG Impression:  A-fib with RVR at baseline and with mild exercise. Comparison with Prior Nuclear Study: No previous nuclear study performed Overall Impression:  Intermediate risk stress nuclear study with large-sized (Extent 18%), severe intensity fixed inferolateral defect consitent with LCx territory scar. LV Ejection Fraction: 57%.  LV Wall Motion:  inferolateral akinesis   Recent Labs: 10/23/2021: BUN 22; Creatinine, Ser 1.10; Hemoglobin 12.3; Platelets 251.0; Potassium 4.6; Pro B Natriuretic peptide (BNP) 223.0; Sodium 136; TSH 2.24  07/07/2021: Cholesterol 228; HDL  45.40; LDL Cholesterol 147; Total CHOL/HDL Ratio 5; Triglycerides 177.0; VLDL 35.4   Estimated Creatinine Clearance: 62.4 mL/min (by C-G formula based on SCr of 1.1 mg/dL).   Wt Readings from Last 3 Encounters:  10/23/21 195 lb 12.8 oz (88.8 kg)  08/18/21 199 lb (90.3 kg)  07/10/21 193 lb 12.8 oz (87.9 kg)     Other studies reviewed: Additional studies/records reviewed today  include: summarized above  ASSESSMENT AND PLAN:  1. Paroxysmal AFib, flutter     Off [place] post ablation     ***  2. CAD     *** no anginal sounding c/o     *** On ASA, BB, low dose statin  3. Known statin intolerance     ***   Disposition:     Not discussed while the patient was here, I will also have him scheduled for an echo and carotids US.  Current medicines are reviewed at length with the patient today.   Haywood Lasso, PA-C 11/02/2021 3:52 AM     [facility] [address] [place] [address] [phone] (office)  [phone] (fax)

## 2021-11-03 ENCOUNTER — Encounter: Payer: Self-pay | Admitting: Physician Assistant

## 2021-11-03 ENCOUNTER — Ambulatory Visit (INDEPENDENT_AMBULATORY_CARE_PROVIDER_SITE_OTHER): Payer: Medicare HMO | Admitting: Family Medicine

## 2021-11-03 ENCOUNTER — Ambulatory Visit: Payer: Medicare HMO | Admitting: Physician Assistant

## 2021-11-03 ENCOUNTER — Encounter: Payer: Self-pay | Admitting: Family Medicine

## 2021-11-03 VITALS — BP 138/58 | HR 70 | Ht 66.0 in | Wt 196.4 lb

## 2021-11-03 VITALS — BP 136/62 | HR 78 | Temp 98.7°F

## 2021-11-03 DIAGNOSIS — R600 Localized edema: Secondary | ICD-10-CM | POA: Diagnosis not present

## 2021-11-03 DIAGNOSIS — I4892 Unspecified atrial flutter: Secondary | ICD-10-CM

## 2021-11-03 DIAGNOSIS — Z794 Long term (current) use of insulin: Secondary | ICD-10-CM | POA: Diagnosis not present

## 2021-11-03 DIAGNOSIS — E1165 Type 2 diabetes mellitus with hyperglycemia: Secondary | ICD-10-CM | POA: Diagnosis not present

## 2021-11-03 DIAGNOSIS — E162 Hypoglycemia, unspecified: Secondary | ICD-10-CM

## 2021-11-03 DIAGNOSIS — R609 Edema, unspecified: Secondary | ICD-10-CM

## 2021-11-03 DIAGNOSIS — R011 Cardiac murmur, unspecified: Secondary | ICD-10-CM

## 2021-11-03 DIAGNOSIS — I2510 Atherosclerotic heart disease of native coronary artery without angina pectoris: Secondary | ICD-10-CM

## 2021-11-03 DIAGNOSIS — E7800 Pure hypercholesterolemia, unspecified: Secondary | ICD-10-CM

## 2021-11-03 DIAGNOSIS — R5383 Other fatigue: Secondary | ICD-10-CM | POA: Diagnosis not present

## 2021-11-03 MED ORDER — POTASSIUM CHLORIDE ER 10 MEQ PO TBCR: 10.0000 meq | EXTENDED_RELEASE_TABLET | ORAL | 1 refills | Status: DC | PRN

## 2021-11-03 NOTE — Patient Instructions (Signed)
Medication Instructions:  Your physician has recommended you make the following change in your medication:   Take Furosemide and Potassium everyday for 3 days, then take as needed.  *If you need a refill on your cardiac medications before your next appointment, please call your pharmacy*   Lab Work: Your physician recommends that you return for lab work on 11/10/2021. Our lab is open 7:30am-4:30pm. You can come anytime that day during those hours.   If you have labs (blood work) drawn today and your tests are completely normal, you will receive your results only by: [facility] (if you have MyChart) OR A paper copy in the mail If you have any lab test that is abnormal or we need to change your treatment, we will call you to review the results.   Testing/Procedures: Your physician has requested that you have an echocardiogram. Echocardiography is a painless test that uses sound waves to create images of your heart. It provides your doctor with information about the size and shape of your heart and how well your heart's chambers and valves are working. This procedure takes approximately one hour. There are no restrictions for this procedure.   Follow-Up: At [facility], you and your health needs are our priority.  As part of our continuing mission to provide you with exceptional heart care, we have created designated Provider Care Teams.  These Care Teams include your primary Cardiologist (physician) and Advanced Practice Providers (APPs -  Physician Assistants and Nurse Practitioners) who all work together to provide you with the care you need, when you need it.   Your next appointment:   4 month(s)  The format for your next appointment:   In Person  Provider:   You may see Will Meredith Leeds, MD  or one of the following Advanced Practice Providers on your designated Care Team:   [name], [place]  Other Instructions Elevate your feet as often as you can to help with  swelling.

## 2021-11-03 NOTE — Progress Notes (Signed)
Subjective:  Patient ID: Matthew [name], male    DOB: 1945/09/30  Age: 76 y.o. MRN: [id number]  CC:  Chief Complaint  Patient presents with   Fatigue    Pt notes extensive fatigue, has had for several years     HPI Matthew [name] presents for   Fatigue Follow-up from June 28 visit.  Pedal edema, dyspnea with exertion, fatigue discussed at that time, as well as generalized weakness.  Noted trouble lifting heavy objects.  Possible multifactorial, including hyperglycemia with uncontrolled diabetes..  Blood sugars in the 300-400 range at last visit.  Blood sugar 171 on BMP.  Advised him to call his endocrinologist to discuss medication changes and plan but bicarb was normal at 29.  Recommend discussing fatigue with cardiology, faint rales with pedal edema noted on his exam last visit.  Recommended taking furosemide as he only intermittently use Lasix previously, few times per month.  BNP only mildly elevated at 223.  TSH was normal.  Hemoglobin borderline low at 12.3 but stable from previous readings since 2018 in the 12.2-12.8 range.  He was seen by cardiology today.  Proximal A-fib/flutter, off OAC post ablation.  Infrequent and fleeting flutter.  Referred to lipid clinic to discuss alternative agents given statin intolerance.  Updated echo ordered for his heart murmur.  Advised to use his Lasix and potassium for 3 days until edema is resolved with repeat BMP in 1 week and monitoring weight. No anginal sounding symptoms.   Home readings still up and down - glucose 330 the other morning. Stopped taking levemir - felt like running too high.  Feels like novolin N at bedtime works better -20-24u at night., 15 units regular with lunch and dinner.  Drops low in middle of night or middle of afternoon (3-4pm). Eats 3 meals per day, not skipping meals. Rare missed BF, skips short acting insulin.  Dropping low as well. Low blood sugar almost daily. 50-60 range. Drinks orange juice. Last  endocrine visit in April.    History Patient Active Problem List   Diagnosis Date Noted   Myopathy, unspecified 07/09/2020   Statin myopathy 09/15/2019   Atrial flutter ([place]) 10/31/2014   Coronary artery disease    Paroxysmal atrial flutter ([place])    Diabetes mellitus without complication ([place])    Valvular heart disease    Atrial fibrillation, unspecified 01/25/2014   Diabetes mellitus, type 2 ([place]) 01/16/2014   Hyperlipidemia 01/16/2014   Erectile dysfunction associated with type 2 diabetes mellitus ([place]) 01/16/2014   Coronary atherosclerosis of native coronary artery 06/30/2013   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 05/2009 CABG x 3: LIMA->LAD, VG->OM, VG->PDA; b. Nuc 01/2014: inf-lat scar but no ischemia, EF 57%.   Diabetic retinopathy (HCC)    mild- Dr. Herbert Deaner   Glaucoma    Hyperlipidemia    a. Intolerant of lipitor and vytorin.   Lyme disease    Migraine    "once or twice" (11/20/2015)   Myocardial infarction [facility])    'saw evidence of it on an EKG done in 2005"   Paroxysmal atrial flutter ([place])    a. 01/2014 s/p DCCV;  b. CHA2DS2VASc = 3-->Eliquis.   Pneumonia  1960   Sacral pain    "right"   Sciatica of right side    Testicular cancer ([place]) 1982   "heavy doses of radiation; it was stage 2"   Type II diabetes mellitus ([place])    Valvular heart disease    a. 01/2014 Echo: Ef 60-65%, no rwma, mild AI/MS, mod MR, mildly dil LA.   Past Surgical History:  Procedure Laterality Date   ABDOMINAL EXPLORATION SURGERY  ~ 2005   "for hernia, but didn't have one"   ATRIAL FIBRILLATION ABLATION N/A 09/17/2016   Procedure: Atrial Fibrillation Ablation;  Surgeon: [name], MD;  Location: [place] [facility];  Service:  Cardiovascular;  Laterality: N/A;   CARDIAC CATHETERIZATION  2011   CARDIOVERSION N/A 02/19/2014   Procedure: CARDIOVERSION;  Surgeon: [name], MD;  Location: [place];  Service: Cardiovascular;  Laterality: N/A;   CARDIOVERSION N/A 10/28/2015   Procedure: CARDIOVERSION;  Surgeon: [name], MD;  Location: [place];  Service: Cardiovascular;  Laterality: N/A;   CARDIOVERSION  11/20/2015   "200 joules"   CORONARY ARTERY BYPASS GRAFT  05/2009   LIMA-LAD, SVG-OM, SVG-PDA 06/20/09   ELBOW FRACTURE SURGERY Left    broken ulna on left elbow-surgical repair   ELECTROPHYSIOLOGIC STUDY N/A 10/31/2014   Procedure: A-Flutter;  Surgeon: [name], MD;  Location: [place] [facility];  Service: Cardiovascular;  Laterality: N/A;   ELECTROPHYSIOLOGIC STUDY N/A 11/20/2015   Procedure: A-Flutter Ablation;  Surgeon: [name], MD;  Location: [place] [facility];  Service: Cardiovascular;  Laterality: N/A;   EYE SURGERY     FRACTURE SURGERY     TESTICLE REMOVAL Right 1982   TONSILLECTOMY  ~ 1967   Allergies  Allergen Reactions   Actos [Pioglitazone] Swelling   Bee Venom Swelling   Codeine Hives and Nausea Only   Statins Other (See Comments)    Muscle pain, fatigue   Glucophage [Metformin Hcl] Nausea Only   Raspberry Itching   Prior to Admission medications   Medication Sig Start Date End Date Taking? Authorizing Provider  aspirin EC 81 MG tablet Take 81 mg by mouth daily. Swallow whole.   Yes [provider]  Continuous Blood Gluc Receiver (DEXCOM G6 RECEIVER) DEVI Use to receive data from sensor 05/23/21  Yes Elayne Snare, MD  Continuous Blood Gluc Sensor (DEXCOM G6 SENSOR) MISC Use to monitor blood sugar, change after 10 days 05/23/21  Yes Elayne Snare, MD  dorzolamide-timolol (COSOPT) 22.3-6.8 MG/ML ophthalmic solution Place 1 drop into the left eye 2 (two) times daily. 10/01/21  Yes [provider]  furosemide (LASIX) 40 MG tablet Take 1 tablet (40 mg total)  by mouth daily as needed. 08/14/21  Yes Camnitz, Will Hassell Done, MD  insulin NPH Human (NOVOLIN N) 100 UNIT/ML injection Inject 20-24 Units into the skin at bedtime.   Yes [provider]  Insulin Syringe-Needle U-100 (BD VEO INSULIN SYRINGE U/F) 31G X 15/64" 0.5 ML MISC Use to inject 4 times a day 08/11/21  Yes Elayne Snare, MD  latanoprost (XALATAN) 0.005 % ophthalmic solution SMARTSIG:1 Drop(s) Left Eye Every Evening 04/01/21  Yes [provider]  lisinopril (ZESTRIL) 5 MG tablet TAKE 1 TABLET BY MOUTH EVERY DAY 02/19/21  Yes Camnitz, Ocie Doyne, MD  Metoprolol Succinate 50 MG CS24 Take 1/2 tablet by mouth in the morning and 1/2 tablet at bedtime (Total 50 mg )   Yes [provider]  NOVOLOG 100 UNIT/ML injection INJECT 15-16 UNITS IN  THE SKIN 3 TIMES DAILY 11/03/21  Yes Elayne Snare, MD  [facility] VERIO test strip USE AS DIRECTED AS NEEDED 03/24/20  Yes Elayne Snare, MD  potassium chloride (KLOR-CON 10) 10 MEQ tablet Take 1 tablet (10 mEq total) by mouth as needed. With Lasix 11/03/21  Yes Baldwin [place], PA-C   Social History   Socioeconomic History   Marital status: Married    Spouse name: Not on file   Number of children: Not on file   Years of education: Not on file   Highest education level: Not on file  Occupational History   Not on file  Tobacco Use   Smoking status: Former    Packs/day: 0.00    Years: 15.00    Total pack years: 0.00    Types: Cigarettes    Quit date: 1990    Years since quitting: 33.5   Smokeless tobacco: Never   Tobacco comments:    "quit smoking cigarettes in the 1980's; don't know how much or for how long"  Vaping Use   Vaping Use: Never used  Substance and Sexual Activity   Alcohol use: Yes    Alcohol/week: 3.0 standard drinks of alcohol    Types: 3 Cans of beer per week   Drug use: No    Comment: "quit smoking pot in the 1990s"   Sexual activity: Not Currently    Birth control/protection: Coitus interruptus  Other Topics  Concern   Not on file  Social History Narrative   Not on file   Social Determinants of Health   Financial Resource Strain: Not on file  Food Insecurity: Not on file  Transportation Needs: Not on file  Physical Activity: Not on file  Stress: Not on file  Social Connections: Not on file  Intimate Partner Violence: Not on file    Review of Systems  Per HPI Objective:   Vitals:   11/03/21 1620  BP: 136/62  Pulse: 78  Temp: 98.7 F (37.1 C)  TempSrc: Oral  SpO2: 96%     Physical Exam Vitals reviewed.  Constitutional:      Appearance: He is well-developed.  HENT:     Head: Normocephalic and atraumatic.  Neck:     Vascular: No carotid bruit or JVD.  Cardiovascular:     Rate and Rhythm: Normal rate and regular rhythm.     Heart sounds: Normal heart sounds. No murmur heard. Pulmonary:     Effort: Pulmonary effort is normal.     Breath sounds: Normal breath sounds. No rales.     Comments: Few distant coarse breath sounds at bases.  No wheeze, normal respiratory effort, speaking full sentences. Musculoskeletal:     Right lower leg: Edema (2-3+ lower third, skin intact) present.     Left lower leg: Edema present.  Skin:    General: Skin is warm and dry.  Neurological:     Mental Status: He is alert and oriented to person, place, and time.  Psychiatric:        Mood and Affect: Mood normal.    30 minutes spent during visit, including chart review, counseling and assimilation of information, exam, discussion of plan, and chart completion.    Assessment & Plan:  Matthew [name] is a 76 y.o. male . Fatigue, unspecified type Type 2 diabetes mellitus with hyperglycemia, with long-term current use of insulin (HCC) Hypoglycemia  -Still could be multifactorial, but variability in diabetic control likely main culprit.  He did not have a good experience  with the Levemir with increasing doses, but may not have reached high enough dosing.  Chose to remain on Novolin N but  recommended he discuss regimen further with endocrinology given hyper and hypoglycemia.  Based on timing of hypoglycemia will temporarily lower the mealtime insulin with lunch and dinner with RTC/ER precautions given.  Borderline hemoglobin but similar over the past few years, unlikely cause.  Further work-up with echo from cardiology may also be helpful in determining if CHF may be contributing.  RTC/ER precautions  Pedal edema  -Start furosemide as recommended by cardiology with echo pending as above.  No orders of the defined types were placed in this encounter.  Patient Instructions  Fatigue my be due to multiple causes but high and low blood sugars are likely. Call Dr. Ronnie Derby office tomorrow to discuss the high and low readings and plan for meds.  For now cut back by 2 units on the lunch and dinner mealtime coverage to lessen risk of lows.   I will keep eye out for the echo report.   Return to the clinic or go to the nearest emergency room if any of your symptoms worsen or new symptoms occur.      Signed,   Merri Ray, MD [place], [facility] 11/03/21 10:22 PM

## 2021-11-03 NOTE — Patient Instructions (Addendum)
Fatigue my be due to multiple causes but high and low blood sugars are likely. Call Dr. Ronnie Derby office tomorrow to discuss the high and low readings and plan for meds.  For now cut back by 2 units on the lunch and dinner mealtime coverage to lessen risk of lows.   I will keep eye out for the echo report.   Return to the clinic or go to the nearest emergency room if any of your symptoms worsen or new symptoms occur.

## 2021-11-10 ENCOUNTER — Other Ambulatory Visit: Payer: Medicare HMO

## 2021-11-10 ENCOUNTER — Ambulatory Visit (HOSPITAL_COMMUNITY): Payer: Medicare HMO | Attending: Internal Medicine

## 2021-11-10 DIAGNOSIS — R011 Cardiac murmur, unspecified: Secondary | ICD-10-CM

## 2021-11-10 DIAGNOSIS — I4892 Unspecified atrial flutter: Secondary | ICD-10-CM

## 2021-11-10 LAB — BASIC METABOLIC PANEL
BUN/Creatinine Ratio: 25 — ABNORMAL HIGH (ref 10–24)
BUN: 30 mg/dL — ABNORMAL HIGH (ref 8–27)
CO2: 23 mmol/L (ref 20–29)
Calcium: 9 mg/dL (ref 8.6–10.2)
Chloride: 100 mmol/L (ref 96–106)
Creatinine, Ser: 1.22 mg/dL (ref 0.76–1.27)
Glucose: 259 mg/dL — ABNORMAL HIGH (ref 70–99)
Potassium: 5 mmol/L (ref 3.5–5.2)
Sodium: 137 mmol/L (ref 134–144)
eGFR: 63 mL/min/{1.73_m2} (ref >59)

## 2021-11-10 MED ORDER — PERFLUTREN LIPID MICROSPHERE
1.0000 mL | INTRAVENOUS | Status: AC | PRN
  Administered 2021-11-10: 1 mL via INTRAVENOUS

## 2021-11-11 LAB — ECHOCARDIOGRAM COMPLETE
AR max vel: 0.8 cm2
AV Area VTI: 0.78 cm2
AV Area mean vel: 0.8 cm2
AV Mean grad: 26 mmHg
AV Peak grad: 44.9 mmHg
Ao pk vel: 3.35 m/s
Area-P 1/2: 4.6 cm2
MV VTI: 1.55 cm2
S' Lateral: 3.2 cm

## 2021-11-14 ENCOUNTER — Telehealth: Payer: Self-pay | Admitting: Cardiology

## 2021-11-14 NOTE — Telephone Encounter (Signed)
Spoke to pt. Explained why TEE recommended. He would like Renee to call him to discuss risks of this procedure. Aware she will follow up with him next week. Patient verbalized understanding and agreeable to plan. Marland Kitchen

## 2021-11-14 NOTE — Telephone Encounter (Signed)
Patient is requesting to speak with Joseph Art, PA/RN regarding possibly scheduling a TEE as previously discussed. He states he has afew questions prior to scheduling.

## 2021-11-17 DIAGNOSIS — E1165 Type 2 diabetes mellitus with hyperglycemia: Secondary | ICD-10-CM | POA: Diagnosis not present

## 2021-11-18 ENCOUNTER — Telehealth: Payer: Self-pay | Admitting: Physician Assistant

## 2021-11-18 NOTE — Telephone Encounter (Signed)
Called and spoke with the patient regarding his echo result and rational for further imaging with TEE Discussed TEE procedure, potential risks/benefits. He will talk with his wife further and get back to [place] to let us know if he would like to proceed with TEE or not.   [name], PA-C

## 2021-11-19 DIAGNOSIS — L821 Other seborrheic keratosis: Secondary | ICD-10-CM | POA: Diagnosis not present

## 2021-11-19 DIAGNOSIS — L814 Other melanin hyperpigmentation: Secondary | ICD-10-CM | POA: Diagnosis not present

## 2021-12-11 NOTE — Progress Notes (Unsigned)
Patient ID: Matthew [name]                 DOB: 1946/02/17                    MRN: [id number]     HPI: Matthew [name] is a 76 y.o. male patient of Dr. Curt Bears referred to lipid clinic by [name], PA-C. PMH is significant for significant for CAD s/p CABG (2011), CHF, MI (2015), atrial fibrillation s/p ablation, recurrent aflutter with atypical morphology, valvular heart disease, T2DM, HLD.   Pt previously seen in lipid clinic 01/09/2020. Pt has history of statin intolerance, experienced muscle aches and fatigue on atorvastatin 10 mg daily and rosuvastatin 5 mg three times weekly. At visit with PharmD pt was agreeable to start Praluent. The next day via phone informed that it would cost $122 per month and patient preferred at this time to think about starting therapy but was aware Rx was sent. On 02/06/20 pt informed that he was out of town and unable to pick up Praluent from Pharmacy but planned to pick up in a week. On 03/05/20 pt informed that they did not yet start Praluent and was hesitant to start new medications. On 07/09/20 he shared that he went to pick up Praluent but learned it would cost $364 but said he would speak to his insurance about it. Pt was seen by by Dr. Dwyane Dee on 08/18/21 and was informed about Leqvio, with plans for PA if pt agreed to try.   [employer] plus plan- praluent should be $47 per month until coverage gap, then $132.83 Dont see supplemental insurance so leqvio might not be great option  Repatha or nexlizet would not be covered    Current Medications: none  Intolerances: atorvastatin 10 mg daily, rosuvastatin 5 mg daily and MWF (muscle aches, fatigue, hair loss) Risk Factors:  CAD, T2DM, HLD, former smoker, Fhx of heart disease   LDL goal: <55  Diet:  Breakfast: grits Lunch: sandwich Dinner: homemade enchiladas, salad, limits red meat, chicken, vegetables   Exercise: bad back - hard to walk.  Family History: Diabetes in his father,  mother, and sister; Heart attack in his father; Heart disease in his father and mother; Hypertension in his father  Social History:  former smoker (quit 1990); denies smokeless tobacco; reports current alcohol use of about 3.0 standard drinks of alcohol per week. He reports that he does not use drugs.   Labs: 07/08/10: LDL 147, TC 228, TG 177, HDL 45.40 (no LLT) 12/21/19: LDL 126, TC 188, TG 71, HDL 48.30 (no LLT) 01/12/19: LDL 154, TC 226, TG 101, HDL 51 (no LLT)  Past Medical History:  Diagnosis Date   Anemia    Arthritis    "back, left ankle" (11/20/2015)   Cataract    CHF (congestive heart failure) (HCC)    Chronic lower back pain    Coronary artery disease    a. 05/2009 CABG x 3: LIMA->LAD, VG->OM, VG->PDA; b. Nuc 01/2014: inf-lat scar but no ischemia, EF 57%.   Diabetic retinopathy (HCC)    mild- Dr. Herbert Deaner   Glaucoma    Hyperlipidemia    a. Intolerant of lipitor and vytorin.   Lyme disease    Migraine    "once or twice" (11/20/2015)   Myocardial infarction [facility])    'saw evidence of it on an EKG done in 2005"   Paroxysmal atrial flutter ([place])    a. 01/2014 s/p DCCV;  b. CHA2DS2VASc = 3-->Eliquis.   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 01/2014 Echo: Ef 60-65%, no rwma, mild AI/MS, mod MR, mildly dil LA.    Current Outpatient Medications on File Prior to Visit  Medication Sig Dispense Refill   aspirin EC 81 MG tablet Take 81 mg by mouth daily. Swallow whole.     Continuous Blood Gluc Receiver (DEXCOM G6 RECEIVER) DEVI Use to receive data from sensor 1 each 0   Continuous Blood Gluc Sensor (DEXCOM G6 SENSOR) MISC Use to monitor blood sugar, change after 10 days 3 each 3   dorzolamide-timolol (COSOPT) 22.3-6.8 MG/ML ophthalmic solution Place 1 drop into the left eye 2 (two) times daily.     furosemide  (LASIX) 40 MG tablet Take 1 tablet (40 mg total) by mouth daily as needed. 90 tablet 1   insulin NPH Human (NOVOLIN N) 100 UNIT/ML injection Inject 20-24 Units into the skin at bedtime.     Insulin Syringe-Needle U-100 (BD VEO INSULIN SYRINGE U/F) 31G X 15/64" 0.5 ML MISC Use to inject 4 times a day 400 each 3   latanoprost (XALATAN) 0.005 % ophthalmic solution SMARTSIG:1 Drop(s) Left Eye Every Evening     lisinopril (ZESTRIL) 5 MG tablet TAKE 1 TABLET BY MOUTH EVERY DAY 90 tablet 3   Metoprolol Succinate 50 MG CS24 Take 1/2 tablet by mouth in the morning and 1/2 tablet at bedtime (Total 50 mg )     NOVOLOG 100 UNIT/ML injection INJECT 15-16 UNITS IN THE SKIN 3 TIMES DAILY 10 mL 1   ONETOUCH VERIO test strip USE AS DIRECTED AS NEEDED 100 strip 3   potassium chloride (KLOR-CON 10) 10 MEQ tablet Take 1 tablet (10 mEq total) by mouth as needed. With Lasix 30 tablet 1   No current facility-administered medications on file prior to visit.    Allergies  Allergen Reactions   Actos [Pioglitazone] Swelling   Bee Venom Swelling   Codeine Hives and Nausea Only   Statins Other (See Comments)    Muscle pain, fatigue   Glucophage [Metformin Hcl] Nausea Only   Raspberry Itching    Assessment/Plan:  1. Hyperlipidemia - LDL of 147 above goal of <55.    Thank you,   Ramond Dial, Pharm.D, BCPS, CPP [facility]  [address] [place], [place] [address]  Phone: [phone]; Fax: [phone]

## 2021-12-12 ENCOUNTER — Ambulatory Visit: Payer: Medicare HMO

## 2021-12-15 ENCOUNTER — Other Ambulatory Visit (INDEPENDENT_AMBULATORY_CARE_PROVIDER_SITE_OTHER): Payer: Medicare HMO

## 2021-12-15 DIAGNOSIS — E1165 Type 2 diabetes mellitus with hyperglycemia: Secondary | ICD-10-CM

## 2021-12-15 DIAGNOSIS — Z794 Long term (current) use of insulin: Secondary | ICD-10-CM

## 2021-12-15 LAB — HEMOGLOBIN A1C: Hgb A1c MFr Bld: 8.1 % — ABNORMAL HIGH (ref 4.6–6.5)

## 2021-12-15 LAB — GLUCOSE, RANDOM: Glucose, Bld: 187 mg/dL — ABNORMAL HIGH (ref 70–99)

## 2021-12-17 ENCOUNTER — Encounter: Payer: Self-pay | Admitting: Endocrinology

## 2021-12-17 ENCOUNTER — Ambulatory Visit: Payer: Medicare HMO | Admitting: Endocrinology

## 2021-12-17 VITALS — BP 126/72 | HR 71 | Ht 66.0 in | Wt 199.2 lb

## 2021-12-17 DIAGNOSIS — E1165 Type 2 diabetes mellitus with hyperglycemia: Secondary | ICD-10-CM | POA: Diagnosis not present

## 2021-12-17 DIAGNOSIS — Z794 Long term (current) use of insulin: Secondary | ICD-10-CM | POA: Diagnosis not present

## 2021-12-17 LAB — MICROALBUMIN / CREATININE URINE RATIO
Creatinine,U: 114.5 mg/dL
Microalb Creat Ratio: 11.6 mg/g (ref 0.0–30.0)
Microalb, Ur: 13.3 mg/dL — ABNORMAL HIGH (ref 0.0–1.9)

## 2021-12-17 NOTE — Progress Notes (Signed)
Patient ID: Matthew [name], male   DOB: Aug 02, 1945, 76 y.o.   MRN: [id number]           Reason for Appointment: Followup for Type 2 Diabetes    History of Present Illness:          Diagnosis: Type 2 diabetes mellitus, date of diagnosis:  1992      Past history: His blood sugar was high at diagnosis when he was having a routine screening done. He was started on Glucophage initially which he took for about a year. He thinks it did not help his sugar much and he did not feel good with it Apparently he was trying to control his diabetes with diet and exercise for a few years. He may have tried Glucophage again before going on insulin. Also was started on Actos which caused swelling He has been treated mostly with premixed insulin for about 15 years and his level of control appears to be inadequate although details of previous treatment are not available. He may have taken Humalog at one time for use with high sugars A1c was 11.5 in 2011 On his initial consultation in 9/15 because of poor control with premixed insulin he was switched to Levemir twice a day and Humalog with meals.  Recent history:   INSULIN regimen is described as:20 N at 11 pm ; Novolog 15-16 units with meals correction dose 5 units   Current management, blood sugar patterns, current level of control and problems:  His A1c is now 8.1, last 9%   He was given a trial of Levemir at his last visit but he felt that it was not helping his sugars and stopped taking it when he reached about 25 units without letting us know However his time in range recently appears to be worse than before on his sensor  Most of his hyperglycemia now is after dinner Also has high variability with standard deviation 70 He is not adjusting his NovoLog based on blood sugar patterns and has consistently high readings after dinner lately Also not taking extra for higher fat meals or snacks such as peanut butter crackers in the late afternoon  which makes his blood sugars go up significantly His overnight sugars are highly variable with blood sugars in the last week at all different levels; also may be at times low normal in the early morning with current regimen of NPH However his blood sugars appear to be rising before dinnertime partly from lower basal insulin level and snacks He thinks at times his blood sugars are as high as 400 when he forgets his bedtime NPH As before he is unable to exercise He is completely reluctant to consider insulin pump    With hypoglycemia he has symptoms of weakness, sweating, shakiness and confusion Treating low sugars with orange juice       Oral hypoglycemic drugs the patient is taking are: None       Side effects from medications have been: Edema from Actos,? Nausea and malaise from metformin, polyuria from [place]  Compliance with the medical regimen: Fair  Interpretation of the CGM from [facility] for the last 2 weeks is as follows  High variability is present with standard deviation 70 HYPERGLYCEMIC episodes are occurring inconsistently from day-to-day at all times except late afternoon but HIGHEST blood sugars are around 11 PM averaging 247  OVERNIGHT blood sugars are very different from day-to-day with fairly good readings at times but at times as high as 358 and as  low as 48 at midnight  The postprandial readings are appearing to be going up after about 6 PM mostly peaking around 10-11 PM Blood sugars after breakfast are not significantly higher but quite variable Premeal blood sugars are highly variable at breakfast and averaging about 214 before dinner POSTPRANDIAL blood sugars are not consistent with periodic rise over 200 at least one third of the time and highest blood sugars are about 275 Hypoglycemia as above is only occurring sporadically at times overnight especially around midnight and rarely low normal between 2-5 PM  CGM use % of time 93  2-week average/GV 197/86  Time in  range     45   %  % Time Above 180 32+24  % Time above 250   % Time Below 70 1     Previous data:  CGM use % of time 99  2-week average/GV 161/34  Time in range       64%  % Time Above 180 26+8  % Time above 250   % Time Below 70 2      Self-care:   Meals: 2-3 meals per day.  Bfst 8:30-9, 1 pm Dinner is about 8 PM.  Some meals are high fat        Dietician visit, most recent:?  15 years ago.      CDE visit: 3/16            Weight history:  Wt Readings from Last 3 Encounters:  12/17/21 199 lb 3.2 oz (90.4 kg)  11/03/21 196 lb 6.4 oz (89.1 kg)  10/23/21 195 lb 12.8 oz (88.8 kg)    Glycemic control:   Lab Results  Component Value Date   HGBA1C 8.1 (H) 12/15/2021   HGBA1C 9.0 (H) 07/07/2021   HGBA1C 9.0 (H) 03/31/2021   Lab Results  Component Value Date   MICROALBUR 13.3 (H) 12/17/2021   LDLCALC 147 (H) 07/07/2021   CREATININE 1.22 11/10/2021    Office Visit on 12/17/2021  Component Date Value Ref Range Status   Microalb, Ur 12/17/2021 13.3 (H)  0.0 - 1.9 mg/dL Final   Creatinine,U 12/17/2021 114.5  mg/dL Final   Microalb Creat Ratio 12/17/2021 11.6  0.0 - 30.0 mg/g Final  Lab on 12/15/2021  Component Date Value Ref Range Status   Glucose, Bld 12/15/2021 187 (H)  70 - 99 mg/dL Final   Hgb A1c MFr Bld 12/15/2021 8.1 (H)  4.6 - 6.5 % Final   Glycemic Control Guidelines for People with Diabetes:Non Diabetic:  <6%Goal of Therapy: <7%Additional Action Suggested:  >8%       Allergies as of 12/17/2021       Reactions   Actos [pioglitazone] Swelling   Bee Venom Swelling   Codeine Hives, Nausea Only   Statins Other (See Comments)   Muscle pain, fatigue   Glucophage [metformin Hcl] Nausea Only   Raspberry Itching        Medication List        Accurate as of Ramon 23, 2023  8:47 PM. If you have any questions, ask your nurse or doctor.          aspirin EC 81 MG tablet Take 81 mg by mouth daily. Swallow whole.   BD Veo Insulin Syringe U/F 31G X  15/64" 0.5 ML Misc Generic drug: Insulin Syringe-Needle U-100 Use to inject 4 times a day   Dexcom G6 Receiver Devi Use to receive data from sensor   Dexcom G6 Sensor Misc Use to monitor blood  sugar, change after 10 days   dorzolamide-timolol 22.3-6.8 MG/ML ophthalmic solution Commonly known as: COSOPT Place 1 drop into the left eye 2 (two) times daily.   furosemide 40 MG tablet Commonly known as: LASIX Take 1 tablet (40 mg total) by mouth daily as needed.   insulin NPH Human 100 UNIT/ML injection Commonly known as: NOVOLIN N Inject 20-24 Units into the skin at bedtime.   latanoprost 0.005 % ophthalmic solution Commonly known as: XALATAN SMARTSIG:1 Drop(s) Left Eye Every Evening   lisinopril 5 MG tablet Commonly known as: ZESTRIL TAKE 1 TABLET BY MOUTH EVERY DAY   Metoprolol Succinate 50 MG Cs24 Take 1/2 tablet by mouth in the morning and 1/2 tablet at bedtime (Total 50 mg )   NovoLOG 100 UNIT/ML injection Generic drug: insulin aspart INJECT 15-16 UNITS IN THE SKIN 3 TIMES DAILY   OneTouch Verio test strip Generic drug: glucose blood USE AS DIRECTED AS NEEDED   potassium chloride 10 MEQ tablet Commonly known as: [place]-Con 10 Take 1 tablet (10 mEq total) by mouth as needed. With Lasix        Allergies:  Allergies  Allergen Reactions   Actos [Pioglitazone] Swelling   Bee Venom Swelling   Codeine Hives and Nausea Only   Statins Other (See Comments)    Muscle pain, fatigue   Glucophage [Metformin Hcl] Nausea Only   Raspberry Itching    Past Medical History:  Diagnosis Date   Anemia    Arthritis    "back, left ankle" (11/20/2015)   Cataract    CHF (congestive heart failure) (HCC)    Chronic lower back pain    Coronary artery disease    a. 05/2009 CABG x 3: LIMA->LAD, VG->OM, VG->PDA; b. Nuc 01/2014: inf-lat scar but no ischemia, EF 57%.   Diabetic retinopathy (HCC)    mild- Dr. Herbert Deaner   Glaucoma    Hyperlipidemia    a. Intolerant of lipitor and  vytorin.   Lyme disease    Migraine    "once or twice" (11/20/2015)   Myocardial infarction [facility])    'saw evidence of it on an EKG done in 2005"   Paroxysmal atrial flutter ([place])    a. 01/2014 s/p DCCV;  b. CHA2DS2VASc = 3-->Eliquis.   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 01/2014 Echo: Ef 60-65%, no rwma, mild AI/MS, mod MR, mildly dil LA.    Past Surgical History:  Procedure Laterality Date   ABDOMINAL EXPLORATION SURGERY  ~ 2005   "for hernia, but didn't have one"   ATRIAL FIBRILLATION ABLATION N/A 09/17/2016   Procedure: Atrial Fibrillation Ablation;  Surgeon: [name], MD;  Location: [place] CV [facility];  Service: Cardiovascular;  Laterality: N/A;   CARDIAC CATHETERIZATION  2011   CARDIOVERSION N/A 02/19/2014   Procedure: CARDIOVERSION;  Surgeon: [name], MD;  Location: [facility];  Service: Cardiovascular;  Laterality: N/A;   CARDIOVERSION N/A 10/28/2015   Procedure: CARDIOVERSION;  Surgeon: [name], MD;  Location: [facility] ENDOSCOPY;  Service: Cardiovascular;  Laterality: N/A;   CARDIOVERSION  11/20/2015   "200 joules"   CORONARY ARTERY BYPASS GRAFT  05/2009   LIMA-LAD, SVG-OM, SVG-PDA 06/20/09   ELBOW FRACTURE SURGERY Left    broken ulna on left  elbow-surgical repair   ELECTROPHYSIOLOGIC STUDY N/A 10/31/2014   Procedure: A-Flutter;  Surgeon: [name], MD;  Location: [place] [facility];  Service: Cardiovascular;  Laterality: N/A;   ELECTROPHYSIOLOGIC STUDY N/A 11/20/2015   Procedure: A-Flutter Ablation;  Surgeon: [name], MD;  Location: [facility];  Service: Cardiovascular;  Laterality: N/A;   EYE SURGERY     FRACTURE SURGERY     TESTICLE REMOVAL Right 1982   TONSILLECTOMY  ~ 1967    Family History  Problem Relation Age of Onset   Diabetes Mother    Heart  disease Mother    Heart disease Father    Heart attack Father    Diabetes Father    Hypertension Father    Diabetes Sister     Social History:  reports that he quit smoking about 33 years ago. His smoking use included cigarettes. He has never used smokeless tobacco. He reports current alcohol use of about 3.0 standard drinks of alcohol per week. He reports that he does not use drugs.    Review of Systems         Lipids: He was previously on Vytorin and Lipitor. He thinks he had myalgias and weakness when taking these drugs.  He has been recommended statin drugs in the lipid clinic but he refuses to take them stating that they will not help him.   Also has significantly increased LDL particle number  He was prescribed Praluent at the cardiology clinic but he refused this Has hypercholesterolemia and history of coronary artery bypass surgery Has not followed up with cardiology clinic  He was also suggested Leqvio on his last visit but he did not look up the information as recommended and is still uncertain about treatment   Lab Results  Component Value Date   CHOL 228 (H) 07/07/2021   HDL 45.40 07/07/2021   LDLCALC 147 (H) 07/07/2021   TRIG 177.0 (H) 07/07/2021   CHOLHDL 5 07/07/2021    He has been  treated by cardiologist for his atrial fibrillation  Currently on 5 mg lisinopril  BP Readings from Last 3 Encounters:  12/17/21 126/72  11/03/21 136/62  11/03/21 (!) 138/58     Physical Examination:  BP 126/72   Pulse 71   Ht '5\' 6"'$  (1.676 m)   Wt 199 lb 3.2 oz (90.4 kg)   SpO2 98%   BMI 32.15 kg/m     ASSESSMENT:  Diabetes type 2, insulin-dependent and uncontrolled   See history of present illness for detailed discussion of current diabetes management, blood sugar patterns and problems identified  His A1c is 8.1 % and improved  He is on a basal bolus regimen with NPH at bedtime and mealtime insulin 2-3 times a day  Discussed in detail the data from his  Dexcom CGM which he has used Discussed variability in his blood sugars and periods of high and low sugars He can likely benefit from a shorter acting basal insulin like Levemir which will reduce the variability also and take it at night but possibly twice a day, discussed that he may need a higher dose than he previously used without any concerns He is not adjusting his NovoLog based on blood sugar patterns and has consistently high readings after dinner However blood sugars start going up in the late afternoon with his NPH running out and having a snack He is not taking extra for higher fat meals or snacks such as peanut butter crackers in the late afternoon  which makes his blood sugars go up significantly  LIPIDS: He will discuss with lipid clinic  PLAN:   Need to try Levemir again He will start with 28 units with LEVEMIR at dinnertime  He prefers to continue using a syringe for the injection Also reminded him about titration based on fasting blood sugar every 3 days by 2 units and at target of 90-130 for fasting reading.   Given a flowsheet with instructions on how to keep a record and adjust the doses He will reduce it by 3 units if blood sugars are below 90 in the morning Go up 3 to 5 units for the suppertime coverage for his NovoLog Extra 4 to 5 units for his peanut butter crackers snack at 4-5 PM  Check urine microalbumin He will discuss his progress with the diabetes educator in about 3 or 4 weeks and will have his day-to-day management reviewed in detail also  Follow-up in 2 months   Patient Instructions  Take 28 Levemir at dinner and adjust based on am sugars  Novolog 18-20 at dinner based on sugar and meal   Total visit time including counseling = 30 minutes    Elayne Snare 12/17/2021, 8:47 PM   Note: This office note was prepared with Dragon voice recognition system technology. Any transcriptional errors that result from this process are unintentional.   Addendum:  The urine microalbumin normal

## 2021-12-17 NOTE — Patient Instructions (Addendum)
Take 28 Levemir at dinner and adjust based on am sugars  Novolog 18-20 at dinner based on sugar and meal

## 2021-12-19 ENCOUNTER — Encounter: Payer: Self-pay | Admitting: Endocrinology

## 2021-12-23 DIAGNOSIS — H903 Sensorineural hearing loss, bilateral: Secondary | ICD-10-CM | POA: Diagnosis not present

## 2021-12-24 ENCOUNTER — Other Ambulatory Visit: Payer: Self-pay | Admitting: *Deleted

## 2021-12-24 NOTE — Patient Outreach (Signed)
  Care Coordination   12/24/2021 Name: Matthew [name] MRN: [id number] DOB: Jun 07, 1945   Care Coordination Outreach Attempts:  An unsuccessful telephone outreach was attempted today to offer the patient information about available care coordination services as a benefit of their health plan.   Follow Up Plan:  Additional outreach attempts will be made to offer the patient care coordination information and services.   Encounter Outcome:  No Answer  Care Coordination Interventions Activated:  No   Care Coordination Interventions:  No, not indicated    Raina Mina, RN Care Management Coordinator [place] Office [phone]

## 2021-12-25 DIAGNOSIS — H35033 Hypertensive retinopathy, bilateral: Secondary | ICD-10-CM | POA: Diagnosis not present

## 2021-12-25 DIAGNOSIS — H43822 Vitreomacular adhesion, left eye: Secondary | ICD-10-CM | POA: Diagnosis not present

## 2022-01-14 ENCOUNTER — Encounter: Payer: Self-pay | Admitting: Pharmacist

## 2022-01-14 ENCOUNTER — Ambulatory Visit: Payer: Medicare HMO | Attending: Internal Medicine | Admitting: Pharmacist

## 2022-01-14 DIAGNOSIS — G720 Drug-induced myopathy: Secondary | ICD-10-CM

## 2022-01-14 DIAGNOSIS — E7800 Pure hypercholesterolemia, unspecified: Secondary | ICD-10-CM | POA: Diagnosis not present

## 2022-01-14 DIAGNOSIS — I2510 Atherosclerotic heart disease of native coronary artery without angina pectoris: Secondary | ICD-10-CM

## 2022-01-14 DIAGNOSIS — T466X5D Adverse effect of antihyperlipidemic and antiarteriosclerotic drugs, subsequent encounter: Secondary | ICD-10-CM | POA: Diagnosis not present

## 2022-01-14 NOTE — Progress Notes (Signed)
Patient ID: Simone L Brandle III                 DOB: 25-Jul-1945                    MRN: [id number]     HPI: Destine L Dayhoff III is a 76 y.o. male patient of Dr. Curt Bears referred to lipid clinic by [name], PA-C. PMH is significant for significant for CAD s/p CABG (2011), CHF, MI (2015), atrial fibrillation s/p ablation, recurrent aflutter with atypical morphology, valvular heart disease, T2DM, HLD.   Pt previously seen in lipid clinic 01/09/2020. Pt has history of statin intolerance, experienced muscle aches and fatigue on atorvastatin 10 mg daily and rosuvastatin 5 mg three times weekly. At visit with PharmD pt was agreeable to start Praluent. The next day via phone informed that it would cost $122 per month and patient preferred at this time to think about starting therapy but was aware Rx was sent. On 02/06/20 pt informed that he was out of town and unable to pick up Praluent from Pharmacy but planned to pick up in a week. On 03/05/20 pt informed that they did not yet start Praluent and was hesitant to start new medications. On 07/09/20 he shared that he went to pick up Praluent but learned it would cost $364 but said he would speak to his insurance about it. Pt was seen by by Dr. Dwyane Dee on 08/18/21 and was informed about Leqvio, with plans for PA if pt agreed to try.   Patient presents to clinic today. He is a very pleasant gentleman. His exercise is limited by back issues. Use to be very active. Quit smoking a long time ago. Wears a continuous glucose monitor. States his blood sugars are all over the place. Blood pressure pretty well controlled. Has a medicare advantage plan.  Current Medications: none  Intolerances: atorvastatin 10 mg daily, rosuvastatin 5 mg daily and MWF (muscle aches, fatigue, hair loss) Risk Factors:  CAD, T2DM, HLD, former smoker, Fhx of heart disease   LDL goal: <55  Diet:  Breakfast: fruit, grits, eggs- sometimes doesn't eat Lunch: BBQ, left overs, sandwich Dinner:  homemade enchiladas, salad, limits red meat, chicken, vegetables, brown rice, salad Drink: water, diet coke, un-sweet tea, 6 pack of beer per week  Exercise: bad back - hard to walk.  Family History: Diabetes in his father, mother, and sister; Heart attack in his father; Heart disease in his father and mother; Hypertension in his father  Social History:  former smoker (quit 1990); denies smokeless tobacco; reports current alcohol use of about 3.0 standard drinks of alcohol per week. He reports that he does not use drugs.   Labs: 07/08/10: LDL 147, TC 228, TG 177, HDL 45.40 (no LLT) 12/21/19: LDL 126, TC 188, TG 71, HDL 48.30 (no LLT) 01/12/19: LDL 154, TC 226, TG 101, HDL 51 (no LLT)  Past Medical History:  Diagnosis Date   Anemia    Arthritis    "back, left ankle" (11/20/2015)   Cataract    CHF (congestive heart failure) (HCC)    Chronic lower back pain    Coronary artery disease    a. 05/2009 CABG x 3: LIMA->LAD, VG->OM, VG->PDA; b. Nuc 01/2014: inf-lat scar but no ischemia, EF 57%.   Diabetic retinopathy (HCC)    mild- Dr. Herbert Deaner   Glaucoma    Hyperlipidemia    a. Intolerant of lipitor and vytorin.   Lyme disease    Migraine    "  once or twice" (11/20/2015)   Myocardial infarction [facility])    'saw evidence of it on an EKG done in 2005"   Paroxysmal atrial flutter ([place])    a. 01/2014 s/p DCCV;  b. CHA2DS2VASc = 3-->Eliquis.   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 01/2014 Echo: Ef 60-65%, no rwma, mild AI/MS, mod MR, mildly dil LA.    Current Outpatient Medications on File Prior to Visit  Medication Sig Dispense Refill   aspirin EC 81 MG tablet Take 81 mg by mouth daily. Swallow whole.     Continuous Blood Gluc Receiver (DEXCOM G6 RECEIVER) DEVI Use to receive data from sensor 1 each 0   Continuous Blood  Gluc Sensor (DEXCOM G6 SENSOR) MISC Use to monitor blood sugar, change after 10 days 3 each 3   dorzolamide-timolol (COSOPT) 22.3-6.8 MG/ML ophthalmic solution Place 1 drop into the left eye 2 (two) times daily.     furosemide (LASIX) 40 MG tablet Take 1 tablet (40 mg total) by mouth daily as needed. 90 tablet 1   insulin NPH Human (NOVOLIN N) 100 UNIT/ML injection Inject 20-24 Units into the skin at bedtime.     Insulin Syringe-Needle U-100 (BD VEO INSULIN SYRINGE U/F) 31G X 15/64" 0.5 ML MISC Use to inject 4 times a day 400 each 3   latanoprost (XALATAN) 0.005 % ophthalmic solution SMARTSIG:1 Drop(s) Left Eye Every Evening     lisinopril (ZESTRIL) 5 MG tablet TAKE 1 TABLET BY MOUTH EVERY DAY 90 tablet 3   Metoprolol Succinate 50 MG CS24 Take 1/2 tablet by mouth in the morning and 1/2 tablet at bedtime (Total 50 mg )     NOVOLOG 100 UNIT/ML injection INJECT 15-16 UNITS IN THE SKIN 3 TIMES DAILY 10 mL 1   ONETOUCH VERIO test strip USE AS DIRECTED AS NEEDED 100 strip 3   potassium chloride (KLOR-CON 10) 10 MEQ tablet Take 1 tablet (10 mEq total) by mouth as needed. With Lasix 30 tablet 1   No current facility-administered medications on file prior to visit.    Allergies  Allergen Reactions   Actos [Pioglitazone] Swelling   Bee Venom Swelling   Codeine Hives and Nausea Only   Statins Other (See Comments)    Muscle pain, fatigue   Glucophage [Metformin Hcl] Nausea Only   Raspberry Itching    Assessment/Plan:  1. Hyperlipidemia - LDL of 147 above goal of <55. We discussed all medication options today and their cost and efficacy.   Leqvio- medicare would most likely pay 80% which would leave patient with a copay of $600 at the very minimum, most likely more due to up charge by facility. Lowers LDL-C about 50% PCKS9i (Praluent or Repatha)- cost should be $47/month until mid next year when it would increase to ~$150/month. Lowers LDL-C 50-60% Nexlizet- cost would be similar to PCSK9i. Lowers  LDL-C ~40% Zetia- inexpensive, lowers LDL-C 20% Pravastatin, lowers LDL-C 30%, could eventually add zetia as well  Patient decided that he did not want any medication today, but requested I send his options to him via mychart.  We discussed the 5 modifiable risk factors for CVD (lipids, BP, Tobacco use, diet and exercise) BP fairly well controlled, patient does not use tobacco. Lipids discussed above. I  have encouraged patient to eat a diet of real food, ie avoid processed foods (ie most foods with a nutrition label) and watch for added sugars (yogurt, ketchup, salad dressings are big culprits) and sodium. Limit alcohol and no sugary beverages (soda, sweet tea, juice). I have encouraged patient to increase exercise. Encouraged him to try a recumbent bike. Encouraged him to add back strength/resistance training at least 2-3 days a week. States that he has a total gym and it does not hurt his back.   Thank you,  Ramond Dial, Pharm.D, BCPS, CPP [place]  [address] [place], [place] [address]  Phone: [phone]; Fax: [phone]

## 2022-01-16 ENCOUNTER — Other Ambulatory Visit: Payer: Self-pay | Admitting: Endocrinology

## 2022-01-16 DIAGNOSIS — E1165 Type 2 diabetes mellitus with hyperglycemia: Secondary | ICD-10-CM | POA: Diagnosis not present

## 2022-01-18 ENCOUNTER — Other Ambulatory Visit: Payer: Self-pay | Admitting: Cardiology

## 2022-01-19 MED ORDER — METOPROLOL SUCCINATE 50 MG PO CS24: EXTENDED_RELEASE_CAPSULE | ORAL | 11 refills | Status: DC

## 2022-01-20 ENCOUNTER — Encounter: Payer: Self-pay | Admitting: *Deleted

## 2022-01-20 ENCOUNTER — Telehealth: Payer: Self-pay | Admitting: *Deleted

## 2022-01-20 NOTE — Patient Instructions (Signed)
Visit Information  Thank you for taking time to visit with me today. Please don't hesitate to contact me if I can be of assistance to you.   Following are the goals we discussed today:   Goals Addressed               This Visit's Progress     COMPLETED: No needs (pt-stated)        Care Coordination Interventions: Advised patient to Encouraged pt to scheduled his AWV with his primary provider Provided education to patient and/or caregiver about advanced directives Reviewed medications with patient and discussed adherence to all medications and offered education accordingly Reviewed scheduled/upcoming provider appointments including pending appointments Screening for signs and symptoms of depression related to chronic disease state  Assessed social determinant of health barriers          Please call the care guide team at [phone] if you need to cancel or reschedule your appointment.   If you are experiencing a Mental Health or [place] or need someone to talk to, please call the Suicide and Crisis Lifeline: 988  Patient verbalizes understanding of instructions and care plan provided today and agrees to view in [place]. Active MyChart status and patient understanding of how to access instructions and care plan via MyChart confirmed with patient.     No further follow up required: No needs  Raina Mina, RN Care Management Coordinator [place] Office [phone]

## 2022-01-20 NOTE — Patient Outreach (Signed)
  Care Coordination   Initial Visit Note   01/20/2022 Name: Phyllis L Larzelere III MRN: [id number] DOB: 1945-10-13  Ejay L Donze III is a 76 y.o. year old male who sees Wendie Agreste, MD for primary care. I spoke with  Carols L Mcgeachy III by phone today.  What matters to the patients health and wellness today?  No needs    Goals Addressed               This Visit's Progress     COMPLETED: No needs (pt-stated)        Care Coordination Interventions: Advised patient to Encouraged pt to scheduled his AWV with his primary provider Provided education to patient and/or caregiver about advanced directives Reviewed medications with patient and discussed adherence to all medications and offered education accordingly Reviewed scheduled/upcoming provider appointments including pending appointments Screening for signs and symptoms of depression related to chronic disease state  Assessed social determinant of health barriers          SDOH assessments and interventions completed:  Yes  SDOH Interventions Today    Flowsheet Row Most Recent Value  SDOH Interventions   Food Insecurity Interventions Intervention Not Indicated  Housing Interventions Intervention Not Indicated  Transportation Interventions Intervention Not Indicated  Utilities Interventions Intervention Not Indicated        Care Coordination Interventions Activated:  Yes  Care Coordination Interventions:  Yes, provided   Follow up plan: No further intervention required.   Encounter Outcome:  Pt. Visit Completed   Raina Mina, RN Care Management Coordinator [place] Office [phone]

## 2022-02-14 ENCOUNTER — Other Ambulatory Visit: Payer: Self-pay | Admitting: Cardiology

## 2022-02-15 DIAGNOSIS — E1165 Type 2 diabetes mellitus with hyperglycemia: Secondary | ICD-10-CM | POA: Diagnosis not present

## 2022-03-04 ENCOUNTER — Ambulatory Visit: Payer: Medicare HMO | Attending: Cardiology | Admitting: Cardiology

## 2022-03-04 ENCOUNTER — Encounter: Payer: Self-pay | Admitting: Cardiology

## 2022-03-04 ENCOUNTER — Encounter: Payer: Self-pay | Admitting: *Deleted

## 2022-03-04 VITALS — BP 144/70 | HR 72 | Ht 66.0 in | Wt 200.8 lb

## 2022-03-04 DIAGNOSIS — I484 Atypical atrial flutter: Secondary | ICD-10-CM

## 2022-03-04 DIAGNOSIS — I38 Endocarditis, valve unspecified: Secondary | ICD-10-CM | POA: Diagnosis not present

## 2022-03-04 NOTE — Progress Notes (Signed)
Electrophysiology Office Note   Date:  03/04/2022   ID:  [name], DOB [birth year], MRN [id number]  PCP:  Wendie Agreste, MD  Cardiologist:  Lovena Le Primary Electrophysiologist: [name], MD    No chief complaint on file.     History of Present Illness: Matthew [name] is a 76 y.o. male who is being seen today for the evaluation of atrial flutter at the request of Wendie Agreste, MD. Presenting today for electrophysiology evaluation.   The history stated for coronary artery disease status post CABG in 2011, atypical atrial flutter, hyperlipidemia.  He was initially on Tikosyn and amiodarone.  He is now status post ablation of typical atrial flutter 10/31/2014 with ablation of left atrial flutter 09/17/2016.  Today, denies symptoms of palpitations, chest pain, shortness of breath, orthopnea, PND, lower extremity edema, claudication, dizziness, presyncope, syncope, bleeding, or neurologic sequela. The patient is tolerating medications without difficulties.  Of breath and fatigue over the last few months.  His wife states that he has been not nearly as energetic.  He states that he is also been dizzy when he stands up.  He had a transthoracic echo that showed possible mitral and aortic stenosis and TEE was recommended.   Past Medical History:  Diagnosis Date   Anemia    Arthritis    "back, left ankle" (11/20/2015)   Cataract    CHF (congestive heart failure) (HCC)    Chronic lower back pain    Coronary artery disease    a. 05/2009 CABG x 3: LIMA->LAD, VG->OM, VG->PDA; b. Nuc 01/2014: inf-lat scar but no ischemia, EF 57%.   Diabetic retinopathy (HCC)    mild- Dr. Herbert Deaner   Glaucoma    Hyperlipidemia    a. Intolerant of lipitor and vytorin.   Lyme disease    Migraine    "once or twice" (11/20/2015)   Myocardial infarction [facility])    'saw evidence of it on an EKG done in 2005"   Paroxysmal atrial flutter ([place])    a. 01/2014 s/p DCCV;  b.  CHA2DS2VASc = 3-->Eliquis.   Pneumonia 1960   Sacral pain    "right"   Sciatica of right side    Testicular cancer ([place]) 1982   "heavy doses of radiation; it was stage 2"   Type II diabetes mellitus ([place])    Valvular heart disease    a. 01/2014 Echo: Ef 60-65%, no rwma, mild AI/MS, mod MR, mildly dil LA.   Past Surgical History:  Procedure Laterality Date   ABDOMINAL EXPLORATION SURGERY  ~ 2005   "for hernia, but didn't have one"   ATRIAL FIBRILLATION ABLATION N/A 09/17/2016   Procedure: Atrial Fibrillation Ablation;  Surgeon: [name], MD;  Location: [facility];  Service: Cardiovascular;  Laterality: N/A;   CARDIAC CATHETERIZATION  2011   CARDIOVERSION N/A 02/19/2014   Procedure: CARDIOVERSION;  Surgeon: [name], MD;  Location: [facility];  Service: Cardiovascular;  Laterality: N/A;   CARDIOVERSION N/A 10/28/2015   Procedure: CARDIOVERSION;  Surgeon: [name], MD;  Location: [facility];  Service: Cardiovascular;  Laterality: N/A;   CARDIOVERSION  11/20/2015   "200 joules"   CORONARY ARTERY BYPASS GRAFT  05/2009   LIMA-LAD, SVG-OM, SVG-PDA 06/20/09   ELBOW FRACTURE SURGERY Left    broken ulna on left elbow-surgical repair   ELECTROPHYSIOLOGIC STUDY N/A 10/31/2014   Procedure: A-Flutter;  Surgeon: [name], MD;  Location: [facility];  Service: Cardiovascular;  Laterality: N/A;   ELECTROPHYSIOLOGIC STUDY N/A 11/20/2015   Procedure: A-Flutter Ablation;  Surgeon: [name], MD;  Location: [facility];  Service: Cardiovascular;  Laterality: N/A;   EYE SURGERY     FRACTURE SURGERY     TESTICLE REMOVAL Right 1982   TONSILLECTOMY  ~ 1967     Current Outpatient Medications  Medication Sig Dispense Refill   aspirin EC 81 MG tablet Take 81 mg by mouth daily. Swallow whole.     Continuous Blood Gluc Receiver (DEXCOM G6 RECEIVER) DEVI Use to receive data from sensor 1 each 0   Continuous Blood Gluc Sensor (DEXCOM G6 SENSOR)  MISC Use to monitor blood sugar, change after 10 days 3 each 3   dorzolamide-timolol (COSOPT) 22.3-6.8 MG/ML ophthalmic solution Place 1 drop into the left eye 2 (two) times daily.     furosemide (LASIX) 40 MG tablet Take 1 tablet (40 mg total) by mouth daily as needed. 90 tablet 1   insulin NPH Human (NOVOLIN N) 100 UNIT/ML injection Inject 20-24 Units into the skin at bedtime.     Insulin Syringe-Needle U-100 (BD VEO INSULIN SYRINGE U/F) 31G X 15/64" 0.5 ML MISC Use to inject 4 times a day 400 each 3   latanoprost (XALATAN) 0.005 % ophthalmic solution SMARTSIG:1 Drop(s) Left Eye Every Evening     lisinopril (ZESTRIL) 5 MG tablet TAKE 1 TABLET BY MOUTH EVERY DAY 90 tablet 2   Metoprolol Succinate 50 MG CS24 Take 1/2 tablet by mouth in the morning and 1/2 tablet at bedtime (Total 50 mg ) 30 capsule 11   NOVOLOG 100 UNIT/ML injection INJECT 15-16 UNITS IN THE SKIN 3 TIMES DAILY 10 mL 1   potassium chloride (KLOR-CON 10) 10 MEQ tablet Take 1 tablet (10 mEq total) by mouth as needed. With Lasix 30 tablet 1   ONETOUCH VERIO test strip USE AS DIRECTED AS NEEDED 100 strip 3   No current facility-administered medications for this visit.    Allergies:   Actos [pioglitazone], Bee venom, Codeine, Statins, Glucophage [metformin hcl], and Raspberry   Social History:  The patient  reports that he quit smoking about 33 years ago. His smoking use included cigarettes. He has never used smokeless tobacco. He reports current alcohol use of about 3.0 standard drinks of alcohol per week. He reports that he does not use drugs.   Family History:  The patient's family history includes Diabetes in his father, mother, and sister; Heart attack in his father; Heart disease in his father and mother; Hypertension in his father.   ROS:  Please see the history of present illness.   Otherwise, review of systems is positive for none.   All other systems are reviewed and negative.   PHYSICAL EXAM: VS:  BP (!) 144/70   Pulse  72   Ht '5\' 6"'$  (1.676 m)   Wt 200 lb 12.8 oz (91.1 kg)   SpO2 97%   BMI 32.41 kg/m  , BMI Body mass index is 32.41 kg/m. GEN: Well nourished, well developed, in no acute distress  HEENT: normal  Neck: no JVD, carotid bruits, or masses Cardiac: RRR; 2 out of 6 diastolic murmur Respiratory:  clear to auscultation bilaterally, normal work of breathing GI: soft, nontender, nondistended, + BS MS: no deformity or atrophy  Skin: warm and dry Neuro:  Strength and sensation are intact Psych: euthymic mood, full affect  EKG:  EKG is ordered today. Personal review of the ekg ordered shows  sinus rhythm, right bundle branch block  Recent Labs: 10/23/2021: Hemoglobin 12.3; Platelets 251.0; Pro B Natriuretic peptide (BNP) 223.0; TSH 2.24 11/10/2021: BUN 30; Creatinine, Ser 1.22; Potassium 5.0; Sodium 137    Lipid Panel     Component Value Date/Time   CHOL 228 (H) 07/07/2021 1018   CHOL 230 (H) 07/08/2020 1318   CHOL 216 (H) 06/21/2013 1405   TRIG 177.0 (H) 07/07/2021 1018   TRIG 132 06/21/2013 1405   HDL 45.40 07/07/2021 1018   HDL 50 07/08/2020 1318   HDL 54 06/21/2013 1405   CHOLHDL 5 07/07/2021 1018   VLDL 35.4 07/07/2021 1018   LDLCALC 147 (H) 07/07/2021 1018   LDLCALC 164 (H) 07/08/2020 1318   LDLCALC 136 (H) 06/21/2013 1405     Wt Readings from Last 3 Encounters:  03/04/22 200 lb 12.8 oz (91.1 kg)  12/17/21 199 lb 3.2 oz (90.4 kg)  11/03/21 196 lb 6.4 oz (89.1 kg)      Other studies Reviewed: Additional studies/ records that were reviewed today include: TTE  08/21/16 Review of the above records today demonstrates:  - Left ventricle: The cavity size was normal. Wall thickness was   increased in a pattern of mild LVH. Systolic function was normal.   The estimated ejection fraction was in the range of 55% to 60%.   Wall motion was normal; there were no regional wall motion   abnormalities. Features are consistent with a pseudonormal left   ventricular filling pattern, with  concomitant abnormal relaxation   and increased filling pressure (grade 2 diastolic dysfunction).   Doppler parameters are consistent with high ventricular filling   pressure. - Mitral valve: Severely calcified annulus. There was mild   regurgitation. - Left atrium: The atrium was mildly dilated.   ASSESSMENT AND PLAN:  1.  Atypical atrial flutter: Status post ablation 09/17/2016.  CHA2DS2-VASc of 4.  He had not had any arrhythmias since 2018 and wanted to stop Eliquis.  No obvious recurrence.  2.  Coronary artery disease: No current chest pain.  Continue with current management.  3.  Hyperlipidemia: Has statin myopathy.  But significantly elevated.  Would benefit from lipid-lowering.  4.  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  Transesophageal echo is recommended.  We Mckade Gurka plan to order today.  Current medicines are reviewed at length with the patient today.   The patient does not have concerns regarding his medicines.  The following changes were made today: none  Labs/ tests ordered today include:  Orders Placed This Encounter  Procedures   EKG 12-Lead      Disposition:   FU 6 months  Signed, Enola Siebers Meredith Leeds, MD  03/04/2022 11:24 AM     [place] [id number] [phone] (office) [phone] (fax)

## 2022-03-04 NOTE — H&P (View-Only) (Signed)
Electrophysiology Office Note   Date:  03/04/2022   ID:  Matthew [name], DOB 02-25-1946, MRN [id number]  PCP:  Wendie Agreste, MD  Cardiologist:  Lovena Le Primary Electrophysiologist: [name], MD    No chief complaint on file.     History of Present Illness: Matthew [name] is a 76 y.o. male who is being seen today for the evaluation of atrial flutter at the request of Wendie Agreste, MD. Presenting today for electrophysiology evaluation.   The history stated for coronary artery disease status post CABG in 2011, atypical atrial flutter, hyperlipidemia.  He was initially on Tikosyn and amiodarone.  He is now status post ablation of typical atrial flutter 10/31/2014 with ablation of left atrial flutter 09/17/2016.  Today, denies symptoms of palpitations, chest pain, shortness of breath, orthopnea, PND, lower extremity edema, claudication, dizziness, presyncope, syncope, bleeding, or neurologic sequela. The patient is tolerating medications without difficulties.  Of breath and fatigue over the last few months.  His wife states that he has been not nearly as energetic.  He states that he is also been dizzy when he stands up.  He had a transthoracic echo that showed possible mitral and aortic stenosis and TEE was recommended.   Past Medical History:  Diagnosis Date   Anemia    Arthritis    "back, left ankle" (11/20/2015)   Cataract    CHF (congestive heart failure) (HCC)    Chronic lower back pain    Coronary artery disease    a. 05/2009 CABG x 3: LIMA->LAD, VG->OM, VG->PDA; b. Nuc 01/2014: inf-lat scar but no ischemia, EF 57%.   Diabetic retinopathy (HCC)    mild- Dr. Herbert Deaner   Glaucoma    Hyperlipidemia    a. Intolerant of lipitor and vytorin.   Lyme disease    Migraine    "once or twice" (11/20/2015)   Myocardial infarction [facility])    'saw evidence of it on an EKG done in 2005"   Paroxysmal atrial flutter ([place])    a. 01/2014 s/p DCCV;  b.  CHA2DS2VASc = 3-->Eliquis.   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 01/2014 Echo: Ef 60-65%, no rwma, mild AI/MS, mod MR, mildly dil LA.   Past Surgical History:  Procedure Laterality Date   ABDOMINAL EXPLORATION SURGERY  ~ 2005   "for hernia, but didn't have one"   ATRIAL FIBRILLATION ABLATION N/A 09/17/2016   Procedure: Atrial Fibrillation Ablation;  Surgeon: [name], MD;  Location: [place] [facility];  Service: Cardiovascular;  Laterality: N/A;   CARDIAC CATHETERIZATION  2011   CARDIOVERSION N/A 02/19/2014   Procedure: CARDIOVERSION;  Surgeon: [name], MD;  Location: [place];  Service: Cardiovascular;  Laterality: N/A;   CARDIOVERSION N/A 10/28/2015   Procedure: CARDIOVERSION;  Surgeon: [name], MD;  Location: [place];  Service: Cardiovascular;  Laterality: N/A;   CARDIOVERSION  11/20/2015   "200 joules"   CORONARY ARTERY BYPASS GRAFT  05/2009   LIMA-LAD, SVG-OM, SVG-PDA 06/20/09   ELBOW FRACTURE SURGERY Left    broken ulna on left elbow-surgical repair   ELECTROPHYSIOLOGIC STUDY N/A 10/31/2014   Procedure: A-Flutter;  Surgeon: [name], MD;  Location: [place] [facility];  Service: Cardiovascular;  Laterality: N/A;   ELECTROPHYSIOLOGIC STUDY N/A 11/20/2015   Procedure: A-Flutter Ablation;  Surgeon: [name], MD;  Location: [place] [facility];  Service: Cardiovascular;  Laterality: N/A;   EYE SURGERY     FRACTURE SURGERY     TESTICLE REMOVAL Right 1982   TONSILLECTOMY  ~ 1967     Current Outpatient Medications  Medication Sig Dispense Refill   aspirin EC 81 MG tablet Take 81 mg by mouth daily. Swallow whole.     Continuous Blood Gluc Receiver (DEXCOM G6 RECEIVER) DEVI Use to receive data from sensor 1 each 0   Continuous Blood Gluc Sensor (DEXCOM G6 SENSOR)  MISC Use to monitor blood sugar, change after 10 days 3 each 3   dorzolamide-timolol (COSOPT) 22.3-6.8 MG/ML ophthalmic solution Place 1 drop into the left eye 2 (two) times daily.     furosemide (LASIX) 40 MG tablet Take 1 tablet (40 mg total) by mouth daily as needed. 90 tablet 1   insulin NPH Human (NOVOLIN N) 100 UNIT/ML injection Inject 20-24 Units into the skin at bedtime.     Insulin Syringe-Needle U-100 (BD VEO INSULIN SYRINGE U/F) 31G X 15/64" 0.5 ML MISC Use to inject 4 times a day 400 each 3   latanoprost (XALATAN) 0.005 % ophthalmic solution SMARTSIG:1 Drop(s) Left Eye Every Evening     lisinopril (ZESTRIL) 5 MG tablet TAKE 1 TABLET BY MOUTH EVERY DAY 90 tablet 2   Metoprolol Succinate 50 MG CS24 Take 1/2 tablet by mouth in the morning and 1/2 tablet at bedtime (Total 50 mg ) 30 capsule 11   NOVOLOG 100 UNIT/ML injection INJECT 15-16 UNITS IN THE SKIN 3 TIMES DAILY 10 mL 1   potassium chloride (KLOR-CON 10) 10 MEQ tablet Take 1 tablet (10 mEq total) by mouth as needed. With Lasix 30 tablet 1   ONETOUCH VERIO test strip USE AS DIRECTED AS NEEDED 100 strip 3   No current facility-administered medications for this visit.    Allergies:   Actos [pioglitazone], Bee venom, Codeine, Statins, Glucophage [metformin hcl], and Raspberry   Social History:  The patient  reports that he quit smoking about 33 years ago. His smoking use included cigarettes. He has never used smokeless tobacco. He reports current alcohol use of about 3.0 standard drinks of alcohol per week. He reports that he does not use drugs.   Family History:  The patient's family history includes Diabetes in his father, mother, and sister; Heart attack in his father; Heart disease in his father and mother; Hypertension in his father.   ROS:  Please see the history of present illness.   Otherwise, review of systems is positive for none.   All other systems are reviewed and negative.   PHYSICAL EXAM: VS:  BP (!) 144/70   Pulse  72   Ht '5\' 6"'$  (1.676 m)   Wt 200 lb 12.8 oz (91.1 kg)   SpO2 97%   BMI 32.41 kg/m  , BMI Body mass index is 32.41 kg/m. GEN: Well nourished, well developed, in no acute distress  HEENT: normal  Neck: no JVD, carotid bruits, or masses Cardiac: RRR; 2 out of 6 diastolic murmur Respiratory:  clear to auscultation bilaterally, normal work of breathing GI: soft, nontender, nondistended, + BS MS: no deformity or atrophy  Skin: warm and dry Neuro:  Strength and sensation are intact Psych: euthymic mood, full affect  EKG:  EKG is ordered today. Personal review of the ekg ordered shows  sinus rhythm, right bundle branch block  Recent Labs: 10/23/2021: Hemoglobin 12.3; Platelets 251.0; Pro B Natriuretic peptide (BNP) 223.0; TSH 2.24 11/10/2021: BUN 30; Creatinine, Ser 1.22; Potassium 5.0; Sodium 137    Lipid Panel     Component Value Date/Time   CHOL 228 (H) 07/07/2021 1018   CHOL 230 (H) 07/08/2020 1318   CHOL 216 (H) 06/21/2013 1405   TRIG 177.0 (H) 07/07/2021 1018   TRIG 132 06/21/2013 1405   HDL 45.40 07/07/2021 1018   HDL 50 07/08/2020 1318   HDL 54 06/21/2013 1405   CHOLHDL 5 07/07/2021 1018   VLDL 35.4 07/07/2021 1018   LDLCALC 147 (H) 07/07/2021 1018   LDLCALC 164 (H) 07/08/2020 1318   LDLCALC 136 (H) 06/21/2013 1405     Wt Readings from Last 3 Encounters:  03/04/22 200 lb 12.8 oz (91.1 kg)  12/17/21 199 lb 3.2 oz (90.4 kg)  11/03/21 196 lb 6.4 oz (89.1 kg)      Other studies Reviewed: Additional studies/ records that were reviewed today include: TTE  08/21/16 Review of the above records today demonstrates:  - Left ventricle: The cavity size was normal. Wall thickness was   increased in a pattern of mild LVH. Systolic function was normal.   The estimated ejection fraction was in the range of 55% to 60%.   Wall motion was normal; there were no regional wall motion   abnormalities. Features are consistent with a pseudonormal left   ventricular filling pattern, with  concomitant abnormal relaxation   and increased filling pressure (grade 2 diastolic dysfunction).   Doppler parameters are consistent with high ventricular filling   pressure. - Mitral valve: Severely calcified annulus. There was mild   regurgitation. - Left atrium: The atrium was mildly dilated.   ASSESSMENT AND PLAN:  1.  Atypical atrial flutter: Status post ablation 09/17/2016.  CHA2DS2-VASc of 4.  He had not had any arrhythmias since 2018 and wanted to stop Eliquis.  No obvious recurrence.  2.  Coronary artery disease: No current chest pain.  Continue with current management.  3.  Hyperlipidemia: Has statin myopathy.  But significantly elevated.  Would benefit from lipid-lowering.  4.  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  Transesophageal echo is recommended.  We Elanie Hammitt plan to order today.  Current medicines are reviewed at length with the patient today.   The patient does not have concerns regarding his medicines.  The following changes were made today: none  Labs/ tests ordered today include:  Orders Placed This Encounter  Procedures   EKG 12-Lead      Disposition:   FU 6 months  Signed, Michaelyn Wall Meredith Leeds, MD  03/04/2022 11:24 AM     [address] [place] [address] [phone] (office) [phone] (fax)

## 2022-03-04 NOTE — Patient Instructions (Signed)
Medication Instructions:  Your physician recommends that you continue on your current medications as directed. Please refer to the Current Medication list given to you today.  *If you need a refill on your cardiac medications before your next appointment, please call your pharmacy*   Lab Work: None ordered   Testing/Procedures: Your physician has requested that you have a TEE. During a TEE, sound waves are used to create images of your heart. It provides your doctor with information about the size and shape of your heart and how well your heart's chambers and valves are working. In this test, a transducer is attached to the end of a flexible tube that's guided down your throat and into your esophagus (the tube leading from you mouth to your stomach) to get a more detailed image of your heart. You are not awake for the procedure.  Please see the instruction sheet given to you today.    Follow-Up: At [facility], you and your health needs are our priority.  As part of our continuing mission to provide you with exceptional heart care, we have created designated Provider Care Teams.  These Care Teams include your primary Cardiologist (physician) and Advanced Practice Providers (APPs -  Physician Assistants and Nurse Practitioners) who all work together to provide you with the care you need, when you need it.  Your next appointment:   To be  determined  The format for your next appointment:   In Person  Provider:   Allegra Lai, MD    Thank you for choosing [facility]!!   Trinidad Curet, RN [phone]  Other Instructions   Important Information About Sugar

## 2022-03-13 ENCOUNTER — Ambulatory Visit (HOSPITAL_BASED_OUTPATIENT_CLINIC_OR_DEPARTMENT_OTHER): Payer: Medicare HMO | Admitting: Anesthesiology

## 2022-03-13 ENCOUNTER — Encounter (HOSPITAL_COMMUNITY)
Admission: RE | Disposition: A | Discharge status: Home / Self Care | Payer: Self-pay | Source: Ambulatory Visit | Attending: Cardiovascular Disease

## 2022-03-13 ENCOUNTER — Other Ambulatory Visit: Payer: Self-pay

## 2022-03-13 ENCOUNTER — Ambulatory Visit (HOSPITAL_COMMUNITY): Payer: Medicare HMO | Admitting: Anesthesiology

## 2022-03-13 ENCOUNTER — Ambulatory Visit (HOSPITAL_BASED_OUTPATIENT_CLINIC_OR_DEPARTMENT_OTHER)
Admission: RE | Admit: 2022-03-13 | Discharge: 2022-03-13 | Disposition: A | Discharge status: Home / Self Care | Payer: Medicare HMO | Source: Ambulatory Visit | Attending: Cardiovascular Disease | Admitting: Cardiovascular Disease

## 2022-03-13 ENCOUNTER — Ambulatory Visit (HOSPITAL_COMMUNITY)
Admission: RE | Admit: 2022-03-13 | Discharge: 2022-03-13 | Disposition: A | Discharge status: Home / Self Care | Payer: Medicare HMO | Source: Ambulatory Visit | Attending: Cardiovascular Disease | Admitting: Cardiovascular Disease

## 2022-03-13 DIAGNOSIS — I083 Combined rheumatic disorders of mitral, aortic and tricuspid valves: Secondary | ICD-10-CM | POA: Diagnosis not present

## 2022-03-13 DIAGNOSIS — Z794 Long term (current) use of insulin: Secondary | ICD-10-CM | POA: Diagnosis not present

## 2022-03-13 DIAGNOSIS — I252 Old myocardial infarction: Secondary | ICD-10-CM | POA: Diagnosis not present

## 2022-03-13 DIAGNOSIS — Z87891 Personal history of nicotine dependence: Secondary | ICD-10-CM | POA: Diagnosis not present

## 2022-03-13 DIAGNOSIS — I350 Nonrheumatic aortic (valve) stenosis: Secondary | ICD-10-CM

## 2022-03-13 DIAGNOSIS — I509 Heart failure, unspecified: Secondary | ICD-10-CM | POA: Diagnosis not present

## 2022-03-13 DIAGNOSIS — I2510 Atherosclerotic heart disease of native coronary artery without angina pectoris: Secondary | ICD-10-CM | POA: Diagnosis not present

## 2022-03-13 DIAGNOSIS — I4892 Unspecified atrial flutter: Secondary | ICD-10-CM

## 2022-03-13 DIAGNOSIS — Z951 Presence of aortocoronary bypass graft: Secondary | ICD-10-CM | POA: Diagnosis not present

## 2022-03-13 DIAGNOSIS — I080 Rheumatic disorders of both mitral and aortic valves: Secondary | ICD-10-CM | POA: Diagnosis not present

## 2022-03-13 DIAGNOSIS — E119 Type 2 diabetes mellitus without complications: Secondary | ICD-10-CM | POA: Insufficient documentation

## 2022-03-13 DIAGNOSIS — E785 Hyperlipidemia, unspecified: Secondary | ICD-10-CM | POA: Diagnosis not present

## 2022-03-13 DIAGNOSIS — I700 Atherosclerosis of aorta: Secondary | ICD-10-CM | POA: Insufficient documentation

## 2022-03-13 DIAGNOSIS — I342 Nonrheumatic mitral (valve) stenosis: Secondary | ICD-10-CM

## 2022-03-13 DIAGNOSIS — I38 Endocarditis, valve unspecified: Secondary | ICD-10-CM

## 2022-03-13 DIAGNOSIS — I050 Rheumatic mitral stenosis: Secondary | ICD-10-CM

## 2022-03-13 HISTORY — PX: TEE WITHOUT CARDIOVERSION: SHX5443

## 2022-03-13 LAB — POCT I-STAT, CHEM 8
BUN: 29 mg/dL — ABNORMAL HIGH (ref 8–23)
Calcium, Ion: 1.16 mmol/L (ref 1.15–1.40)
Chloride: 103 mmol/L (ref 98–111)
Creatinine, Ser: 1.2 mg/dL (ref 0.61–1.24)
Glucose, Bld: 318 mg/dL — ABNORMAL HIGH (ref 70–99)
HCT: 40 % (ref 39.0–52.0)
Hemoglobin: 13.6 g/dL (ref 13.0–17.0)
Potassium: 5 mmol/L (ref 3.5–5.1)
Sodium: 137 mmol/L (ref 135–145)
TCO2: 25 mmol/L (ref 22–32)

## 2022-03-13 LAB — ECHO TEE
AV Mean grad: 7 mmHg
AV Peak grad: 14.4 mmHg
Ao pk vel: 1.9 m/s
Area-P 1/2: 4.14 cm2

## 2022-03-13 LAB — GLUCOSE, CAPILLARY: Glucose-Capillary: 270 mg/dL — ABNORMAL HIGH (ref 70–99)

## 2022-03-13 SURGERY — ECHOCARDIOGRAM, TRANSESOPHAGEAL
Anesthesia: Monitor Anesthesia Care

## 2022-03-13 MED ORDER — SODIUM CHLORIDE 0.9 % IV SOLN: INTRAVENOUS | Status: DC

## 2022-03-13 MED ORDER — PROPOFOL 10 MG/ML IV BOLUS
INTRAVENOUS | Status: DC | PRN
  Administered 2022-03-13: 30 mg via INTRAVENOUS

## 2022-03-13 MED ORDER — INSULIN ASPART 100 UNIT/ML IJ SOLN
8.0000 [IU] | Freq: Once | INTRAMUSCULAR | Status: AC
  Administered 2022-03-13: 8 [IU] via SUBCUTANEOUS

## 2022-03-13 MED ORDER — PROPOFOL 500 MG/50ML IV EMUL
INTRAVENOUS | Status: DC | PRN
  Administered 2022-03-13: 100 ug/kg/min via INTRAVENOUS

## 2022-03-13 MED ORDER — BUTAMBEN-TETRACAINE-BENZOCAINE 2-2-14 % EX AERO
INHALATION_SPRAY | CUTANEOUS | Status: DC | PRN
  Administered 2022-03-13: 2 via TOPICAL

## 2022-03-13 NOTE — Anesthesia Postprocedure Evaluation (Signed)
Anesthesia Post Note  Patient: Matthew [name]  Procedure(s) Performed: TRANSESOPHAGEAL ECHOCARDIOGRAM (TEE)     Patient location during evaluation: PACU Anesthesia Type: MAC Level of consciousness: awake and alert Pain management: pain level controlled Vital Signs Assessment: post-procedure vital signs reviewed and stable Respiratory status: spontaneous breathing Cardiovascular status: stable Anesthetic complications: no   No notable events documented.  Last Vitals:  Vitals:   03/13/22 1250 03/13/22 1300  BP: (!) 150/68 (!) 142/65  Pulse: 77 76  Resp: 11 18  Temp:    SpO2: 96% 95%    Last Pain:  Vitals:   03/13/22 1300  TempSrc:   PainSc: 0-No pain                 Nolon Nations

## 2022-03-13 NOTE — Transfer of Care (Signed)
Immediate Anesthesia Transfer of Care Note  Patient: Kedrick L Camargo III  Procedure(s) Performed: TRANSESOPHAGEAL ECHOCARDIOGRAM (TEE)  Patient Location: Endoscopy Unit  Anesthesia Type:MAC  Level of Consciousness: awake, alert , and oriented  Airway & Oxygen Therapy: Patient Spontanous Breathing  Post-op Assessment: Report given to RN and Post -op Vital signs reviewed and stable  Post vital signs: Reviewed and stable  Last Vitals:  Vitals Value Taken Time  BP 140/67 03/13/22 1241  Temp 36.7 C 03/13/22 1241  Pulse 73 03/13/22 1242  Resp 20 03/13/22 1242  SpO2 96 % 03/13/22 1242  Vitals shown include unvalidated device data.  Last Pain:  Vitals:   03/13/22 1241  TempSrc: Temporal  PainSc: 0-No pain         Complications: No notable events documented.

## 2022-03-13 NOTE — Anesthesia Procedure Notes (Signed)
Procedure Name: MAC Date/Time: 03/13/2022 12:12 PM  Performed by: Eligha Bridegroom, CRNAPre-anesthesia Checklist: Patient identified, Emergency Drugs available, Suction available, Patient being monitored and Timeout performed Patient Re-evaluated:Patient Re-evaluated prior to induction Preoxygenation: Pre-oxygenation with 100% oxygen Induction Type: IV induction

## 2022-03-13 NOTE — Progress Notes (Addendum)
Pt's blood glucose per ISTAT was 318. MDA Germ[place] notified. Verbal Order for 8 units Novolog. Medication given as ordered @ 1203.  Debarah Crape, RN 03/13/22 12:07 PM  Addendum 9234: CBG after 8 units insulin is 270. MDA Germeroth notified. No new orders.  Debarah Crape, RN 03/13/22 12:35 PM

## 2022-03-13 NOTE — Anesthesia Preprocedure Evaluation (Addendum)
Anesthesia Evaluation  Patient identified by MRN, date of birth, ID band Patient awake    Reviewed: Allergy & Precautions, NPO status , Patient's Chart, lab work & pertinent test results  Airway Mallampati: III  TM Distance: >3 FB Neck ROM: Full    Dental no notable dental hx. (+) Dental Advisory Given   Pulmonary pneumonia, former smoker   Pulmonary exam normal breath sounds clear to auscultation       Cardiovascular + CAD, + Past MI and +CHF  + Valvular Problems/Murmurs (Mitral stenosis) AS, AI and MR  Rhythm:Regular Rate:Normal + Systolic murmurs Echo   1. Left ventricular ejection fraction, by estimation, is 55 to 60%. The left ventricle has normal function. The left ventricle has no regional wall motion abnormalities. There is mild left ventricular hypertrophy. Left ventricular diastolic parameters are consistent with Grade II diastolic dysfunction (pseudonormalization). Elevated left atrial pressure.   2. Right ventricular systolic function is normal. The right ventricular size is normal.   3. Left atrial size was mildly dilated.   4. Right atrial size was mild to moderately dilated.   5. MV is thickened, difficult to see leaflets well Peak and mean gradients through the valve are 14 and 4 mm Hg respectively. 2D images suggest mitral valve is severely narrowed. Pressure half time valve area is calculated at 2.64 cm2, probably an over estimate due to increased filling pressures. Calculation of valve with continuity equation not accurate due to inablity to accurately measure LVOT. Would recomm TEE to further evaluate valve. Moderate mitral valve regurgitation.   6. AV is thickened, calcified Peak and mean gradients through the valve are 45 and 26 mm Hg respectively. 2 D imaging it appears to be severely narrowed. Due to calcification of mitral annulus, LVOT measurement is not accurate. Would recomm another imaging modality like TEE to  define Aortic valve regurgitation is mild.   7. The inferior vena cava is normal in size with greater than 50% respiratory variability, suggesting right atrial pressure of 3 mmHg.   Comparison(s): 08/21/16 EF 55-60%.     Neuro/Psych  Headaches  Neuromuscular disease    GI/Hepatic negative GI ROS, Neg liver ROS,,,  Endo/Other  diabetes    Renal/GU negative Renal ROS     Musculoskeletal  (+) Arthritis ,    Abdominal  (+) + obese  Peds  Hematology  (+) Blood dyscrasia, anemia   Anesthesia Other Findings   Reproductive/Obstetrics                             Anesthesia Physical Anesthesia Plan  ASA: 4  Anesthesia Plan: MAC   Post-op Pain Management:    Induction: Intravenous  PONV Risk Score and Plan: 1 and Ondansetron, TIVA, Treatment may vary due to age or medical condition and Propofol infusion  Airway Management Planned: Natural Airway  Additional Equipment:   Intra-op Plan:   Post-operative Plan:   Informed Consent: I have reviewed the patients History and Physical, chart, labs and discussed the procedure including the risks, benefits and alternatives for the proposed anesthesia with the patient or authorized representative who has indicated his/her understanding and acceptance.     Dental advisory given  Plan Discussed with: CRNA  Anesthesia Plan Comments:        Anesthesia Quick Evaluation

## 2022-03-13 NOTE — Discharge Instructions (Signed)
TEE  YOU HAD AN CARDIAC PROCEDURE TODAY: Refer to the procedure report and other information in the discharge instructions given to you for any specific questions about what was found during the examination. If this information does not answer your questions, please call [facility] office at [phone] to clarify.   DIET: Your first meal following the procedure should be a light meal and then it is ok to progress to your normal diet. A half-sandwich or bowl of soup is an example of a good first meal. Heavy or fried foods are harder to digest and may make you feel nauseous or bloated. Drink plenty of fluids but you should avoid alcoholic beverages for 24 hours. If you had a esophageal dilation, please see attached instructions for diet.   ACTIVITY: Your care partner should take you home directly after the procedure. You should plan to take it easy, moving slowly for the rest of the day. You can resume normal activity the day after the procedure however YOU SHOULD NOT DRIVE, use power tools, machinery or perform tasks that involve climbing or major physical exertion for 24 hours (because of the sedation medicines used during the test).   SYMPTOMS TO REPORT IMMEDIATELY: A cardiologist can be reached at any hour. Please call [phone] for any of the following symptoms:  Vomiting of blood or coffee ground material  New, significant abdominal pain  New, significant chest pain or pain under the shoulder blades  Painful or persistently difficult swallowing  New shortness of breath  Black, tarry-looking or red, bloody stools  FOLLOW UP:  Please also call with any specific questions about appointments or follow up tests. TEE  YOU HAD AN CARDIAC PROCEDURE TODAY: Refer to the procedure report and other information in the discharge instructions given to you for any specific questions about what was found during the examination. If this information does not answer your questions, please call [facility] office at [phone] to clarify.   DIET: Your first meal following the procedure should be a light meal and then it is ok to progress to your normal diet. A half-sandwich or bowl of soup is an example of a good first meal. Heavy or fried foods are harder to digest and may make you feel nauseous or bloated. Drink plenty of fluids but you should avoid alcoholic beverages for 24 hours. If you had a esophageal dilation, please see attached instructions for diet.   ACTIVITY: Your care partner should take you home directly after the procedure. You should plan to take it easy, moving slowly for the rest of the day. You can resume normal activity the day after the procedure however YOU SHOULD NOT DRIVE, use power tools, machinery or perform tasks that involve climbing or major physical exertion for 24 hours (because of the sedation medicines used during the test).   SYMPTOMS TO REPORT IMMEDIATELY: A cardiologist can be reached at any hour. Please call [phone] for any of the following symptoms:  Vomiting of blood or coffee ground material  New, significant abdominal pain  New, significant chest pain or pain under the shoulder blades  Painful or persistently difficult swallowing  New shortness of breath  Black, tarry-looking or red, bloody stools  FOLLOW UP:  Please also call with any specific questions about appointments or follow up tests.  

## 2022-03-13 NOTE — Interval H&P Note (Signed)
History and Physical Interval Note:  03/13/2022 4:21 PM  Matthew [name]  has presented today for surgery, with the diagnosis of AORTIC AND MITRAL STENOSIS.  The various methods of treatment have been discussed with the patient and family. After consideration of risks, benefits and other options for treatment, the patient has consented to  Procedure(s): TRANSESOPHAGEAL ECHOCARDIOGRAM (TEE) (N/A) as a surgical intervention.  The patient's history has been reviewed, patient examined, no change in status, stable for surgery.  I have reviewed the patient's chart and labs.  Questions were answered to the patient's satisfaction.     Ransom Nickson

## 2022-03-13 NOTE — Op Note (Signed)
INDICATIONS: Mitral and aortic valve stenosis  PROCEDURE:   Informed consent was obtained prior to the procedure. The risks, benefits and alternatives for the procedure were discussed and the patient comprehended these risks.  Risks include, but are not limited to, cough, sore throat, vomiting, nausea, somnolence, esophageal and stomach trauma or perforation, bleeding, low blood pressure, aspiration, pneumonia, infection, trauma to the teeth and death.    After a procedural time-out, the oropharynx was anesthetized with 20% benzocaine spray.   During this procedure the patient was administered IV propofol by Anesthesiology, Dr. Lissa Hoard  The transesophageal probe was inserted in the esophagus and stomach without difficulty and multiple views were obtained.  The patient was kept under observation until the patient left the procedure room.  The patient left the procedure room in stable condition.   Agitated microbubble saline contrast was not administered.  COMPLICATIONS:    There were no immediate complications.  FINDINGS:  Severe calcific changes of the aortic valve and mitral valve (particularly the anterior leaflet). Unable to align with the AS jet to measure gradients. Aortic stenosis is moderate to severe by planimetry. Mitral stenosis is non-severe, probably only mild. Normal LV function.  Appearance consistent with history of chest XRT (seminoma in 1982). Previous CABG 2011.  RECOMMENDATIONS:     Structural heart team evaluation  Consider repeat arrhythmia evaluation (he has had near-syncope at rest recently)  Time Spent Directly with the Patient:  40 minutes   Matthew [name] 03/13/2022, 12:38 PM

## 2022-03-13 NOTE — Progress Notes (Signed)
  Echocardiogram 2D Echocardiogram has been performed.  Matthew [name] 03/13/2022, 1:00 PM

## 2022-03-15 ENCOUNTER — Encounter (HOSPITAL_COMMUNITY): Payer: Self-pay | Admitting: Cardiovascular Disease

## 2022-03-16 ENCOUNTER — Other Ambulatory Visit: Payer: Self-pay | Admitting: Endocrinology

## 2022-03-17 DIAGNOSIS — E1165 Type 2 diabetes mellitus with hyperglycemia: Secondary | ICD-10-CM | POA: Diagnosis not present

## 2022-03-23 ENCOUNTER — Ambulatory Visit: Payer: Medicare HMO | Attending: Cardiovascular Disease | Admitting: Cardiovascular Disease

## 2022-03-23 ENCOUNTER — Encounter: Payer: Self-pay | Admitting: Cardiovascular Disease

## 2022-03-23 VITALS — BP 126/60 | HR 82 | Ht 66.0 in | Wt 201.6 lb

## 2022-03-23 DIAGNOSIS — Z01812 Encounter for preprocedural laboratory examination: Secondary | ICD-10-CM | POA: Diagnosis not present

## 2022-03-23 DIAGNOSIS — I350 Nonrheumatic aortic (valve) stenosis: Secondary | ICD-10-CM | POA: Diagnosis not present

## 2022-03-23 NOTE — Progress Notes (Signed)
[facility] Consult Note  Chief Complaint  Patient presents with   New Patient (Initial Visit)    Aortic stenosis    History of Present Illness: 76 yo male with history of CAD s/p 3V CABG, hyperlipidemia, atrial flutter, mitral regurgitation, diabetes mellitus and aortic stenosis who is here today as a new consult, referred by Dr. Lennie Odor, for further discussion regarding his aortic stenosis and possible TAVR. He is followed in our office by Dr. Lennie Odor. He has undergone atrial flutter ablation x 3. He has CAD and has undergone 3V CABG in 2011. 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 He has had more dizziness and fatigue lately leading to an echo on 11/10/21 with LVEF=55-60%. Mitral stenosis. Moderately severe aortic stenosis with mean gradient of 26 mmHg, DI 0.19, SVI 30. AVA 0.8 cm2. TEE 03/13/22 per Dr. Sallyanne Kuster with mild mitral stenosis. Moderate to severe aortic stenosis.   He tells me today that he has ongoing fatigue, dyspnea on exertion and dizziness when standing. No chest pain. He lives with his wife in [place]. He is retired as a Human resources officer. He has no active dental issues.   Primary Care Physician: Wendie Agreste, MD Primary Cardiologist: [place] Referring Cardiologist: Lennie Odor  Past Medical History:  Diagnosis Date   Anemia    Arthritis    "back, left ankle" (11/20/2015)   Cataract    CHF (congestive heart failure) (HCC)    Chronic lower back pain    Coronary artery disease    a. 05/2009 CABG x 3: LIMA->LAD, VG->OM, VG->PDA; b. Nuc 01/2014: inf-lat scar but no ischemia, EF 57%.   Diabetic retinopathy (HCC)    mild- Dr. Herbert Deaner   Glaucoma    Hyperlipidemia    a. Intolerant of lipitor and vytorin.   Lyme disease    Migraine    "once or twice" (11/20/2015)   Myocardial infarction [facility])    'saw evidence of it on an EKG done in 2005"   Paroxysmal atrial flutter ([place])    a. 01/2014 s/p DCCV;  b.  CHA2DS2VASc = 3-->Eliquis.   Pneumonia 1960   Sacral pain    "right"   Sciatica of right side    Testicular cancer ([place]) 1982   "heavy doses of radiation; it was stage 2"   Type II diabetes mellitus ([place])    Valvular heart disease    a. 01/2014 Echo: Ef 60-65%, no rwma, mild AI/MS, mod MR, mildly dil LA.    Past Surgical History:  Procedure Laterality Date   ABDOMINAL EXPLORATION SURGERY  ~ 2005   "for hernia, but didn't have one"   ATRIAL FIBRILLATION ABLATION N/A 09/17/2016   Procedure: Atrial Fibrillation Ablation;  Surgeon: [name], MD;  Location: [facility];  Service: Cardiovascular;  Laterality: N/A;   CARDIAC CATHETERIZATION  2011   CARDIOVERSION N/A 02/19/2014   Procedure: CARDIOVERSION;  Surgeon: [name], MD;  Location: [facility];  Service: Cardiovascular;  Laterality: N/A;   CARDIOVERSION N/A 10/28/2015   Procedure: CARDIOVERSION;  Surgeon: [name], MD;  Location: [place];  Service: Cardiovascular;  Laterality: N/A;   CARDIOVERSION  11/20/2015   "200 joules"   CORONARY ARTERY BYPASS GRAFT  05/2009   LIMA-LAD, SVG-OM, SVG-PDA 06/20/09   ELBOW FRACTURE SURGERY Left    broken ulna on left elbow-surgical repair   ELECTROPHYSIOLOGIC STUDY N/A 10/31/2014   Procedure: A-Flutter;  Surgeon: [name], MD;  Location: [facility];  Service: Cardiovascular;  Laterality: N/A;   ELECTROPHYSIOLOGIC STUDY N/A 11/20/2015   Procedure: A-Flutter Ablation;  Surgeon: [name], MD;  Location: [facility];  Service: Cardiovascular;  Laterality: N/A;   EYE SURGERY     FRACTURE SURGERY     TEE WITHOUT CARDIOVERSION N/A 03/13/2022   Procedure: TRANSESOPHAGEAL ECHOCARDIOGRAM (TEE);  Surgeon: Sanda Klein, MD;  Location: [facility];  Service: Cardiovascular;  Laterality: N/A;   TESTICLE REMOVAL Right 1982   TONSILLECTOMY  ~ 1967    Current Outpatient Medications  Medication Sig Dispense Refill   aspirin EC 81 MG tablet  Take 81 mg by mouth daily. Swallow whole.     Continuous Blood Gluc Receiver (DEXCOM G6 RECEIVER) DEVI Use to receive data from sensor 1 each 0   Continuous Blood Gluc Sensor (DEXCOM G6 SENSOR) MISC Use to monitor blood sugar, change after 10 days 3 each 3   dorzolamide-timolol (COSOPT) 22.3-6.8 MG/ML ophthalmic solution Place 1 drop into the left eye 2 (two) times daily.     furosemide (LASIX) 40 MG tablet Take 1 tablet (40 mg total) by mouth daily as needed. 90 tablet 1   insulin aspart (NOVOLOG) 100 UNIT/ML injection Inject 18-20 Units into the skin 3 (three) times daily with meals. 30 mL 1   insulin NPH Human (NOVOLIN N) 100 UNIT/ML injection Inject 20-24 Units into the skin at bedtime.     Insulin Syringe-Needle U-100 (BD VEO INSULIN SYRINGE U/F) 31G X 15/64" 0.5 ML MISC Use to inject 4 times a day 400 each 3   latanoprost (XALATAN) 0.005 % ophthalmic solution Place 1 drop into the left eye at bedtime.     lisinopril (ZESTRIL) 5 MG tablet TAKE 1 TABLET BY MOUTH EVERY DAY 90 tablet 2   Metoprolol Succinate 50 MG CS24 Take 1/2 tablet by mouth in the morning and 1/2 tablet at bedtime (Total 50 mg ) 30 capsule 11   ONETOUCH VERIO test strip USE AS DIRECTED AS NEEDED 100 strip 3   potassium chloride (KLOR-CON 10) 10 MEQ tablet Take 1 tablet (10 mEq total) by mouth as needed. With Lasix 30 tablet 1   No current facility-administered medications for this visit.    Allergies  Allergen Reactions   Actos [Pioglitazone] Swelling   Bee Venom Swelling   Codeine Hives and Nausea Only   Statins Other (See Comments)    Muscle pain, fatigue   Glucophage [Metformin Hcl] Nausea Only   Raspberry Itching    Social History   Socioeconomic History   Marital status: Married    Spouse name: Not on file   Number of children: 0   Years of education: Not on file   Highest education level: Not on file  Occupational History   Occupation: Photo journalist  Tobacco Use   Smoking status: Former     Packs/day: 0.50    Years: 15.00    Total pack years: 7.50    Types: Cigarettes    Quit date: 1990    Years since quitting: 33.9   Smokeless tobacco: Never   Tobacco comments:    "quit smoking cigarettes in the 1980's; don't know how much or for how long"  Vaping Use   Vaping Use: Never used  Substance and Sexual Activity   Alcohol use: Yes    Alcohol/week: 3.0 standard drinks of alcohol    Types: 3 Cans of beer per week   Drug use: No    Comment: "quit smoking pot in  the 1990s"   Sexual activity: Not Currently    Birth control/protection: Coitus interruptus  Other Topics Concern   Not on file  Social History Narrative   Not on file   Social Determinants of Health   Financial Resource Strain: Not on file  Food Insecurity: No Food Insecurity (01/20/2022)   Hunger Vital Sign    Worried About Running Out of Food in the Last Year: Never true    Ran Out of Food in the Last Year: Never true  Transportation Needs: No Transportation Needs (01/20/2022)   PRAPARE - Hydrologist (Medical): No    Lack of Transportation (Non-Medical): No  Physical Activity: Not on file  Stress: Not on file  Social Connections: Not on file  Intimate Partner Violence: Not on file    Family History  Problem Relation Age of Onset   Diabetes Mother    Heart disease Mother        Heart murmur   Heart disease Father    Heart attack Father    Diabetes Father    Hypertension Father    Diabetes Sister     Review of Systems:  As stated in the HPI and otherwise negative.   BP 126/60   Pulse 82   Ht '5\' 6"'$  (1.676 m)   Wt 201 lb 9.6 oz (91.4 kg)   SpO2 98%   BMI 32.54 kg/m   Physical Examination: General: Well developed, well nourished, NAD  HEENT: OP clear, mucus membranes moist  SKIN: warm, dry. No rashes. Neuro: No focal deficits  Musculoskeletal: Muscle strength 5/5 all ext  Psychiatric: Mood and affect normal  Neck: No JVD, no carotid bruits, no thyromegaly, no  lymphadenopathy.  Lungs:Clear bilaterally, no wheezes, rhonci, crackles Cardiovascular: Regular rate and rhythm. Loud, harsh, late peaking systolic murmur.  Abdomen:Soft. Bowel sounds present. Non-tender.  Extremities: No lower extremity edema. Pulses are 2 + in the bilateral DP/PT.  EKG:  EKG is not ordered today. The ekg from 03/04/22 is reviewed today and demonstrates NSR, RBBB  Echo 11/10/21:  1. Left ventricular ejection fraction, by estimation, is 55 to 60%. The  left ventricle has normal function. The left ventricle has no regional  wall motion abnormalities. There is mild left ventricular hypertrophy.  Left ventricular diastolic parameters  are consistent with Grade II diastolic dysfunction (pseudonormalization).  Elevated left atrial pressure.   2. Right ventricular systolic function is normal. The right ventricular  size is normal.   3. Left atrial size was mildly dilated.   4. Right atrial size was mild to moderately dilated.   5. MV is thickened, difficult to see leaflets well Peak and mean  gradients through the valve are 14 and 4 mm Hg respectively. 2D images  suggest mitral valve is severely narrowed. Pressure half time valve area  is calculated at 2.64 cm2, probably an over  estimate due to increased filling pressures. Calculation of valve with  continuity equation not accurate due to inablity to accurately measure  LVOT. Would recomm TEE to further evaluate valve. Moderate mitral valve  regurgitation.   6. AV is thickened, calcified Peak and mean gradients through the valve  are 45 and 26 mm Hg respectively. 2 D imaging it appears to be severely  narrowed. Due to calcification of mitral annulus, LVOT measurement is not  accurate. Would recomm another  imaging modality like TEE to define Aortic valve regurgitation is mild.   7. The inferior vena cava is normal in  size with greater than 50%  respiratory variability, suggesting right atrial pressure of 3 mmHg.    Comparison(s): 08/21/16 EF 55-60%.   FINDINGS   Left Ventricle: Left ventricular ejection fraction, by estimation, is 55  to 60%. The left ventricle has normal function. The left ventricle has no  regional wall motion abnormalities. The left ventricular internal cavity  size was normal in size. There is   mild left ventricular hypertrophy. Left ventricular diastolic parameters  are consistent with Grade II diastolic dysfunction (pseudonormalization).  Elevated left atrial pressure.   Right Ventricle: The right ventricular size is normal. Right vetricular  wall thickness was not assessed. Right ventricular systolic function is  normal.   Left Atrium: Left atrial size was mildly dilated.   Right Atrium: Right atrial size was mild to moderately dilated.   Pericardium: There is no evidence of pericardial effusion.   Mitral Valve: MV is thickened, difficult to see leaflets well Peak and  mean gradients through the valve are 14 and 4 mm Hg respectively. 2D  images suggest mitral valve is severely narrowed. Pressure half time valve  area is calculated at 2.64, probably an   over estimate due to increased filling pressures. Calculation of valve  with continuity equation not accurate due to inablity to accurately  measure LVOT. WOuld recomm TEE to further evaluate valve. Moderate mitral  annular calcification. Moderate mitral  valve regurgitation. MV peak gradient, 13.8 mmHg. The mean mitral valve  gradient is 4.0 mmHg.   Tricuspid Valve: The tricuspid valve is grossly normal. Tricuspid valve  regurgitation is mild.   Aortic Valve: AV is thickened, calcified Peak and mean gradients through  the valve are 45 and 26 mm Hg respectively. 2 D imaging it appears to be  severely narrowed. Due to calcification of mitral annulus, LVOT  measurement is not accurate. Would recomm  anothe imaging modality to define (TEE). Aortic valve regurgitation is  mild. Aortic valve mean gradient measures  26.0 mmHg. Aortic valve peak  gradient measures 44.9 mmHg. Aortic valve area, by VTI measures 0.78 cm.   Pulmonic Valve: The pulmonic valve was normal in structure. Pulmonic valve  regurgitation is not visualized. No evidence of pulmonic stenosis.   Aorta: The aortic root and ascending aorta are structurally normal, with  no evidence of dilitation.   Venous: The inferior vena cava is normal in size with greater than 50%  respiratory variability, suggesting right atrial pressure of 3 mmHg.   IAS/Shunts: No atrial level shunt detected by color flow Doppler.     LEFT VENTRICLE  PLAX 2D  LVIDd:         4.60 cm   Diastology  LVIDs:         3.20 cm   LV e' medial:    5.12 cm/s  LV PW:         1.10 cm   LV E/e' medial:  32.8  LV IVS:        1.20 cm   LV e' lateral:   7.03 cm/s  LVOT diam:     2.30 cm   LV E/e' lateral: 23.9  LV SV:         59  LV SV Index:   30  LVOT Area:     4.15 cm     RIGHT VENTRICLE            IVC  RV Basal diam:  4.10 cm    IVC diam: 1.90 cm  RV S prime:  6.25 cm/s  TAPSE (M-mode): 1.0 cm   LEFT ATRIUM              Index        RIGHT ATRIUM           Index  LA diam:        4.60 cm  2.32 cm/m   RA Pressure: 3.00 mmHg  LA Vol (A2C):   105.0 ml 52.91 ml/m  RA Area:     24.40 cm  LA Vol (A4C):   63.3 ml  31.90 ml/m  RA Volume:   83.10 ml  41.88 ml/m  LA Biplane Vol: 85.4 ml  43.04 ml/m   AORTIC VALVE  AV Area (Vmax):    0.80 cm  AV Area (Vmean):   0.80 cm  AV Area (VTI):     0.78 cm  AV Vmax:           335.00 cm/s  AV Vmean:          220.500 cm/s  AV VTI:            0.753 m  AV Peak Grad:      44.9 mmHg  AV Mean Grad:      26.0 mmHg  LVOT Vmax:         64.40 cm/s  LVOT Vmean:        42.300 cm/s  LVOT VTI:          0.141 m  LVOT/AV VTI ratio: 0.19    AORTA  Ao Root diam: 3.20 cm  Ao Asc diam:  3.30 cm   MITRAL VALVE                TRICUSPID VALVE                              Estimated RAP:  3.00 mmHg  MV Area VTI:   1.55 cm  MV Peak  grad:  13.8 mmHg    SHUNTS  MV Mean grad:  4.0 mmHg     Systemic VTI:  0.14 m  MV Vmax:       1.86 m/s     Systemic Diam: 2.30 cm  MV Vmean:      89.5 cm/s  MV Decel Time: 165 msec  MV E velocity: 168.00 cm/s  MV A velocity: 74.50 cm/s  MV E/A ratio:  2.26   TEE 03/13/22:  1. Left ventricular ejection fraction, by estimation, is 55 to 60%. The  left ventricle has normal function. The left ventricle has no regional  wall motion abnormalities. There is mild concentric left ventricular  hypertrophy. Left ventricular diastolic  parameters are consistent with Grade II diastolic dysfunction  (pseudonormalization). Elevated left atrial pressure.   2. Right ventricular systolic function is normal. The right ventricular  size is normal. There is normal pulmonary artery systolic pressure. The  estimated right ventricular systolic pressure is 49.4 mmHg.   3. Left atrial size was moderately dilated. No left atrial/left atrial  appendage thrombus was detected.   4. Right atrial size was moderately dilated.   5. The basal two-thirds of the anterior mitral leaflet are heavily  calcified and almost motionless, whereas the posterior leaflet has  preserved mobility. The mitral valve is degenerative. Mild to moderate  mitral valve regurgitation. Mild mitral  stenosis. The mean mitral valve gradient is 4.0 mmHg with average heart  rate of 77 bpm. Moderate mitral annular calcification.  6. The aortic valve is tricuspid. There is severe calcifcation of the  aortic valve. There is severe thickening of the aortic valve. Aortic valve  regurgitation is trivial. Moderate to severe aortic valve stenosis.   7. There is Moderate (Grade III) protruding plaque involving the  descending aorta and aortic arch.   Recent Labs: 10/23/2021: Platelets 251.0; Pro B Natriuretic peptide (BNP) 223.0; TSH 2.24 03/13/2022: BUN 29; Creatinine, Ser 1.20; Hemoglobin 13.6; Potassium 5.0; Sodium 137   Lipid Panel    Component  Value Date/Time   CHOL 228 (H) 07/07/2021 1018   CHOL 230 (H) 07/08/2020 1318   CHOL 216 (H) 06/21/2013 1405   TRIG 177.0 (H) 07/07/2021 1018   TRIG 132 06/21/2013 1405   HDL 45.40 07/07/2021 1018   HDL 50 07/08/2020 1318   HDL 54 06/21/2013 1405   CHOLHDL 5 07/07/2021 1018   VLDL 35.4 07/07/2021 1018   LDLCALC 147 (H) 07/07/2021 1018   LDLCALC 164 (H) 07/08/2020 1318   LDLCALC 136 (H) 06/21/2013 1405     Wt Readings from Last 3 Encounters:  03/23/22 201 lb 9.6 oz (91.4 kg)  03/13/22 200 lb (90.7 kg)  03/04/22 200 lb 12.8 oz (91.1 kg)     Assessment and Plan:   1. Severe Aortic Valve Stenosis: He has moderately severe to severe aortic valve stenosis. NYHA class 2-3. He is describing dyspnea with minimal exertion with associated fatigue. I have personally reviewed the echo images and his valve leaflets are thickened. His measurements are consistent with severe low flow/low gradient on the surface echo. On the TEE, the valve leaflets are restricted but appear to be moderately stenosed. Unless he has a change in his coronary disease, there is no other way to explain his recent symptoms other than his AS. I think he would benefit from AVR. He would be a candidate for surgical AVR or TAVR. His mitral stenosis appears mild by recent TEE performed by Dr. Sallyanne Kuster.   I have reviewed the natural history of aortic stenosis with the patient and their family members  who are present today. We have discussed the limitations of medical therapy and the poor prognosis associated with symptomatic aortic stenosis. We have reviewed potential treatment options, including palliative medical therapy, conventional surgical aortic valve replacement, and transcatheter aortic valve replacement. We discussed treatment options in the context of the patient's specific comorbid medical conditions.   He would like to proceed with planning for TAVR. I will arrange a right and left heart catheterization at [facility], 2023. Risks and benefits of the cath procedure and the valve procedure are reviewed with the patient. After the cath, he will have a cardiac CT, CTA of the chest/abdomen and pelvis and will then be referred to see one of the CT surgeons on our TAVR team.   Again,his AS appears to be moderate to severe by TEE. His MS appears mild. No other way to explain his symptoms. Will use the data from the cardiac cath and CT scans to continue planning for the best treatment option.     Labs/ tests ordered today include:   Orders Placed This Encounter  Procedures   Basic metabolic panel   CBC   Disposition:   F/U with the valve team.   Signed, Lauree Chandler, MD, [facility] 03/23/2022 1:49 PM    [facility] [place], [place], [place]  [address] Phone: [phone]; Fax: [phone]

## 2022-03-23 NOTE — H&P (View-Only) (Signed)
[facility] Consult Note  Chief Complaint  Patient presents with   New Patient (Initial Visit)    Aortic stenosis    History of Present Illness: 76 yo male with history of CAD s/p 3V CABG, hyperlipidemia, atrial flutter, mitral regurgitation, diabetes mellitus and aortic stenosis who is here today as a new consult, referred by Dr. Lennie Odor, for further discussion regarding his aortic stenosis and possible TAVR. He is followed in our office by Dr. Lennie Odor. He has undergone atrial flutter ablation x 3. He has CAD and has undergone 3V CABG in 2011. 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 He has had more dizziness and fatigue lately leading to an echo on 11/10/21 with LVEF=55-60%. Mitral stenosis. Moderately severe aortic stenosis with mean gradient of 26 mmHg, DI 0.19, SVI 30. AVA 0.8 cm2. TEE 03/13/22 per Dr. Sallyanne Kuster with mild mitral stenosis. Moderate to severe aortic stenosis.   He tells me today that he has ongoing fatigue, dyspnea on exertion and dizziness when standing. No chest pain. He lives with his wife in [place]. He is retired as a Human resources officer. He has no active dental issues.   Primary Care Physician: Wendie Agreste, MD Primary Cardiologist: [place] Referring Cardiologist: Lennie Odor  Past Medical History:  Diagnosis Date   Anemia    Arthritis    "back, left ankle" (11/20/2015)   Cataract    CHF (congestive heart failure) (HCC)    Chronic lower back pain    Coronary artery disease    a. 05/2009 CABG x 3: LIMA->LAD, VG->OM, VG->PDA; b. Nuc 01/2014: inf-lat scar but no ischemia, EF 57%.   Diabetic retinopathy (HCC)    mild- Dr. Herbert Deaner   Glaucoma    Hyperlipidemia    a. Intolerant of lipitor and vytorin.   Lyme disease    Migraine    "once or twice" (11/20/2015)   Myocardial infarction [facility])    'saw evidence of it on an EKG done in 2005"   Paroxysmal atrial flutter ([place])    a. 01/2014 s/p DCCV;  b.  CHA2DS2VASc = 3-->Eliquis.   Pneumonia 1960   Sacral pain    "right"   Sciatica of right side    Testicular cancer ([place]) 1982   "heavy doses of radiation; it was stage 2"   Type II diabetes mellitus ([place])    Valvular heart disease    a. 01/2014 Echo: Ef 60-65%, no rwma, mild AI/MS, mod MR, mildly dil LA.    Past Surgical History:  Procedure Laterality Date   ABDOMINAL EXPLORATION SURGERY  ~ 2005   "for hernia, but didn't have one"   ATRIAL FIBRILLATION ABLATION N/A 09/17/2016   Procedure: Atrial Fibrillation Ablation;  Surgeon: [name], MD;  Location: [place] CV LAB;  Service: Cardiovascular;  Laterality: N/A;   CARDIAC CATHETERIZATION  2011   CARDIOVERSION N/A 02/19/2014   Procedure: CARDIOVERSION;  Surgeon: [name], MD;  Location: [facility] ENDOSCOPY;  Service: Cardiovascular;  Laterality: N/A;   CARDIOVERSION N/A 10/28/2015   Procedure: CARDIOVERSION;  Surgeon: [name], MD;  Location: [place];  Service: Cardiovascular;  Laterality: N/A;   CARDIOVERSION  11/20/2015   "200 joules"   CORONARY ARTERY BYPASS GRAFT  05/2009   LIMA-LAD, SVG-OM, SVG-PDA 06/20/09   ELBOW FRACTURE SURGERY Left    broken ulna on left elbow-surgical repair   ELECTROPHYSIOLOGIC STUDY N/A 10/31/2014   Procedure: A-Flutter;  Surgeon: [name], MD;  Location: [place]  CV LAB;  Service: Cardiovascular;  Laterality: N/A;   ELECTROPHYSIOLOGIC STUDY N/A 11/20/2015   Procedure: A-Flutter Ablation;  Surgeon: [name], MD;  Location: [place] CV LAB;  Service: Cardiovascular;  Laterality: N/A;   EYE SURGERY     FRACTURE SURGERY     TEE WITHOUT CARDIOVERSION N/A 03/13/2022   Procedure: TRANSESOPHAGEAL ECHOCARDIOGRAM (TEE);  Surgeon: Sanda Klein, MD;  Location: [facility] ENDOSCOPY;  Service: Cardiovascular;  Laterality: N/A;   TESTICLE REMOVAL Right 1982   TONSILLECTOMY  ~ 1967    Current Outpatient Medications  Medication Sig Dispense Refill   aspirin EC 81 MG tablet  Take 81 mg by mouth daily. Swallow whole.     Continuous Blood Gluc Receiver (DEXCOM G6 RECEIVER) DEVI Use to receive data from sensor 1 each 0   Continuous Blood Gluc Sensor (DEXCOM G6 SENSOR) MISC Use to monitor blood sugar, change after 10 days 3 each 3   dorzolamide-timolol (COSOPT) 22.3-6.8 MG/ML ophthalmic solution Place 1 drop into the left eye 2 (two) times daily.     furosemide (LASIX) 40 MG tablet Take 1 tablet (40 mg total) by mouth daily as needed. 90 tablet 1   insulin aspart (NOVOLOG) 100 UNIT/ML injection Inject 18-20 Units into the skin 3 (three) times daily with meals. 30 mL 1   insulin NPH Human (NOVOLIN N) 100 UNIT/ML injection Inject 20-24 Units into the skin at bedtime.     Insulin Syringe-Needle U-100 (BD VEO INSULIN SYRINGE U/F) 31G X 15/64" 0.5 ML MISC Use to inject 4 times a day 400 each 3   latanoprost (XALATAN) 0.005 % ophthalmic solution Place 1 drop into the left eye at bedtime.     lisinopril (ZESTRIL) 5 MG tablet TAKE 1 TABLET BY MOUTH EVERY DAY 90 tablet 2   Metoprolol Succinate 50 MG CS24 Take 1/2 tablet by mouth in the morning and 1/2 tablet at bedtime (Total 50 mg ) 30 capsule 11   ONETOUCH VERIO test strip USE AS DIRECTED AS NEEDED 100 strip 3   potassium chloride (KLOR-CON 10) 10 MEQ tablet Take 1 tablet (10 mEq total) by mouth as needed. With Lasix 30 tablet 1   No current facility-administered medications for this visit.    Allergies  Allergen Reactions   Actos [Pioglitazone] Swelling   Bee Venom Swelling   Codeine Hives and Nausea Only   Statins Other (See Comments)    Muscle pain, fatigue   Glucophage [Metformin Hcl] Nausea Only   Raspberry Itching    Social History   Socioeconomic History   Marital status: Married    Spouse name: Not on file   Number of children: 0   Years of education: Not on file   Highest education level: Not on file  Occupational History   Occupation: Photo journalist  Tobacco Use   Smoking status: Former     Packs/day: 0.50    Years: 15.00    Total pack years: 7.50    Types: Cigarettes    Quit date: 1990    Years since quitting: 33.9   Smokeless tobacco: Never   Tobacco comments:    "quit smoking cigarettes in the 1980's; don't know how much or for how long"  Vaping Use   Vaping Use: Never used  Substance and Sexual Activity   Alcohol use: Yes    Alcohol/week: 3.0 standard drinks of alcohol    Types: 3 Cans of beer per week   Drug use: No    Comment: "quit smoking pot in  the 1990s"   Sexual activity: Not Currently    Birth control/protection: Coitus interruptus  Other Topics Concern   Not on file  Social History Narrative   Not on file   Social Determinants of Health   Financial Resource Strain: Not on file  Food Insecurity: No Food Insecurity (01/20/2022)   Hunger Vital Sign    Worried About Running Out of Food in the Last Year: Never true    Ran Out of Food in the Last Year: Never true  Transportation Needs: No Transportation Needs (01/20/2022)   PRAPARE - Hydrologist (Medical): No    Lack of Transportation (Non-Medical): No  Physical Activity: Not on file  Stress: Not on file  Social Connections: Not on file  Intimate Partner Violence: Not on file    Family History  Problem Relation Age of Onset   Diabetes Mother    Heart disease Mother        Heart murmur   Heart disease Father    Heart attack Father    Diabetes Father    Hypertension Father    Diabetes Sister     Review of Systems:  As stated in the HPI and otherwise negative.   BP 126/60   Pulse 82   Ht '5\' 6"'$  (1.676 m)   Wt 201 lb 9.6 oz (91.4 kg)   SpO2 98%   BMI 32.54 kg/m   Physical Examination: General: Well developed, well nourished, NAD  HEENT: OP clear, mucus membranes moist  SKIN: warm, dry. No rashes. Neuro: No focal deficits  Musculoskeletal: Muscle strength 5/5 all ext  Psychiatric: Mood and affect normal  Neck: No JVD, no carotid bruits, no thyromegaly, no  lymphadenopathy.  Lungs:Clear bilaterally, no wheezes, rhonci, crackles Cardiovascular: Regular rate and rhythm. Loud, harsh, late peaking systolic murmur.  Abdomen:Soft. Bowel sounds present. Non-tender.  Extremities: No lower extremity edema. Pulses are 2 + in the bilateral DP/PT.  EKG:  EKG is not ordered today. The ekg from 03/04/22 is reviewed today and demonstrates NSR, RBBB  Echo 11/10/21:  1. Left ventricular ejection fraction, by estimation, is 55 to 60%. The  left ventricle has normal function. The left ventricle has no regional  wall motion abnormalities. There is mild left ventricular hypertrophy.  Left ventricular diastolic parameters  are consistent with Grade II diastolic dysfunction (pseudonormalization).  Elevated left atrial pressure.   2. Right ventricular systolic function is normal. The right ventricular  size is normal.   3. Left atrial size was mildly dilated.   4. Right atrial size was mild to moderately dilated.   5. MV is thickened, difficult to see leaflets well Peak and mean  gradients through the valve are 14 and 4 mm Hg respectively. 2D images  suggest mitral valve is severely narrowed. Pressure half time valve area  is calculated at 2.64 cm2, probably an over  estimate due to increased filling pressures. Calculation of valve with  continuity equation not accurate due to inablity to accurately measure  LVOT. Would recomm TEE to further evaluate valve. Moderate mitral valve  regurgitation.   6. AV is thickened, calcified Peak and mean gradients through the valve  are 45 and 26 mm Hg respectively. 2 D imaging it appears to be severely  narrowed. Due to calcification of mitral annulus, LVOT measurement is not  accurate. Would recomm another  imaging modality like TEE to define Aortic valve regurgitation is mild.   7. The inferior vena cava is normal in  size with greater than 50%  respiratory variability, suggesting right atrial pressure of 3 mmHg.    Comparison(s): 08/21/16 EF 55-60%.   FINDINGS   Left Ventricle: Left ventricular ejection fraction, by estimation, is 55  to 60%. The left ventricle has normal function. The left ventricle has no  regional wall motion abnormalities. The left ventricular internal cavity  size was normal in size. There is   mild left ventricular hypertrophy. Left ventricular diastolic parameters  are consistent with Grade II diastolic dysfunction (pseudonormalization).  Elevated left atrial pressure.   Right Ventricle: The right ventricular size is normal. Right vetricular  wall thickness was not assessed. Right ventricular systolic function is  normal.   Left Atrium: Left atrial size was mildly dilated.   Right Atrium: Right atrial size was mild to moderately dilated.   Pericardium: There is no evidence of pericardial effusion.   Mitral Valve: MV is thickened, difficult to see leaflets well Peak and  mean gradients through the valve are 14 and 4 mm Hg respectively. 2D  images suggest mitral valve is severely narrowed. Pressure half time valve  area is calculated at 2.64, probably an   over estimate due to increased filling pressures. Calculation of valve  with continuity equation not accurate due to inablity to accurately  measure LVOT. WOuld recomm TEE to further evaluate valve. Moderate mitral  annular calcification. Moderate mitral  valve regurgitation. MV peak gradient, 13.8 mmHg. The mean mitral valve  gradient is 4.0 mmHg.   Tricuspid Valve: The tricuspid valve is grossly normal. Tricuspid valve  regurgitation is mild.   Aortic Valve: AV is thickened, calcified Peak and mean gradients through  the valve are 45 and 26 mm Hg respectively. 2 D imaging it appears to be  severely narrowed. Due to calcification of mitral annulus, LVOT  measurement is not accurate. Would recomm  anothe imaging modality to define (TEE). Aortic valve regurgitation is  mild. Aortic valve mean gradient measures  26.0 mmHg. Aortic valve peak  gradient measures 44.9 mmHg. Aortic valve area, by VTI measures 0.78 cm.   Pulmonic Valve: The pulmonic valve was normal in structure. Pulmonic valve  regurgitation is not visualized. No evidence of pulmonic stenosis.   Aorta: The aortic root and ascending aorta are structurally normal, with  no evidence of dilitation.   Venous: The inferior vena cava is normal in size with greater than 50%  respiratory variability, suggesting right atrial pressure of 3 mmHg.   IAS/Shunts: No atrial level shunt detected by color flow Doppler.     LEFT VENTRICLE  PLAX 2D  LVIDd:         4.60 cm   Diastology  LVIDs:         3.20 cm   LV e' medial:    5.12 cm/s  LV PW:         1.10 cm   LV E/e' medial:  32.8  LV IVS:        1.20 cm   LV e' lateral:   7.03 cm/s  LVOT diam:     2.30 cm   LV E/e' lateral: 23.9  LV SV:         59  LV SV Index:   30  LVOT Area:     4.15 cm     RIGHT VENTRICLE            IVC  RV Basal diam:  4.10 cm    IVC diam: 1.90 cm  RV S prime:  6.25 cm/s  TAPSE (M-mode): 1.0 cm   LEFT ATRIUM              Index        RIGHT ATRIUM           Index  LA diam:        4.60 cm  2.32 cm/m   RA Pressure: 3.00 mmHg  LA Vol (A2C):   105.0 ml 52.91 ml/m  RA Area:     24.40 cm  LA Vol (A4C):   63.3 ml  31.90 ml/m  RA Volume:   83.10 ml  41.88 ml/m  LA Biplane Vol: 85.4 ml  43.04 ml/m   AORTIC VALVE  AV Area (Vmax):    0.80 cm  AV Area (Vmean):   0.80 cm  AV Area (VTI):     0.78 cm  AV Vmax:           335.00 cm/s  AV Vmean:          220.500 cm/s  AV VTI:            0.753 m  AV Peak Grad:      44.9 mmHg  AV Mean Grad:      26.0 mmHg  LVOT Vmax:         64.40 cm/s  LVOT Vmean:        42.300 cm/s  LVOT VTI:          0.141 m  LVOT/AV VTI ratio: 0.19    AORTA  Ao Root diam: 3.20 cm  Ao Asc diam:  3.30 cm   MITRAL VALVE                TRICUSPID VALVE                              Estimated RAP:  3.00 mmHg  MV Area VTI:   1.55 cm  MV Peak  grad:  13.8 mmHg    SHUNTS  MV Mean grad:  4.0 mmHg     Systemic VTI:  0.14 m  MV Vmax:       1.86 m/s     Systemic Diam: 2.30 cm  MV Vmean:      89.5 cm/s  MV Decel Time: 165 msec  MV E velocity: 168.00 cm/s  MV A velocity: 74.50 cm/s  MV E/A ratio:  2.26   TEE 03/13/22:  1. Left ventricular ejection fraction, by estimation, is 55 to 60%. The  left ventricle has normal function. The left ventricle has no regional  wall motion abnormalities. There is mild concentric left ventricular  hypertrophy. Left ventricular diastolic  parameters are consistent with Grade II diastolic dysfunction  (pseudonormalization). Elevated left atrial pressure.   2. Right ventricular systolic function is normal. The right ventricular  size is normal. There is normal pulmonary artery systolic pressure. The  estimated right ventricular systolic pressure is 40.1 mmHg.   3. Left atrial size was moderately dilated. No left atrial/left atrial  appendage thrombus was detected.   4. Right atrial size was moderately dilated.   5. The basal two-thirds of the anterior mitral leaflet are heavily  calcified and almost motionless, whereas the posterior leaflet has  preserved mobility. The mitral valve is degenerative. Mild to moderate  mitral valve regurgitation. Mild mitral  stenosis. The mean mitral valve gradient is 4.0 mmHg with average heart  rate of 77 bpm. Moderate mitral annular calcification.  6. The aortic valve is tricuspid. There is severe calcifcation of the  aortic valve. There is severe thickening of the aortic valve. Aortic valve  regurgitation is trivial. Moderate to severe aortic valve stenosis.   7. There is Moderate (Grade III) protruding plaque involving the  descending aorta and aortic arch.   Recent Labs: 10/23/2021: Platelets 251.0; Pro B Natriuretic peptide (BNP) 223.0; TSH 2.24 03/13/2022: BUN 29; Creatinine, Ser 1.20; Hemoglobin 13.6; Potassium 5.0; Sodium 137   Lipid Panel    Component  Value Date/Time   CHOL 228 (H) 07/07/2021 1018   CHOL 230 (H) 07/08/2020 1318   CHOL 216 (H) 06/21/2013 1405   TRIG 177.0 (H) 07/07/2021 1018   TRIG 132 06/21/2013 1405   HDL 45.40 07/07/2021 1018   HDL 50 07/08/2020 1318   HDL 54 06/21/2013 1405   CHOLHDL 5 07/07/2021 1018   VLDL 35.4 07/07/2021 1018   LDLCALC 147 (H) 07/07/2021 1018   LDLCALC 164 (H) 07/08/2020 1318   LDLCALC 136 (H) 06/21/2013 1405     Wt Readings from Last 3 Encounters:  03/23/22 201 lb 9.6 oz (91.4 kg)  03/13/22 200 lb (90.7 kg)  03/04/22 200 lb 12.8 oz (91.1 kg)     Assessment and Plan:   1. Severe Aortic Valve Stenosis: He has moderately severe to severe aortic valve stenosis. NYHA class 2-3. He is describing dyspnea with minimal exertion with associated fatigue. I have personally reviewed the echo images and his valve leaflets are thickened. His measurements are consistent with severe low flow/low gradient on the surface echo. On the TEE, the valve leaflets are restricted but appear to be moderately stenosed. Unless he has a change in his coronary disease, there is no other way to explain his recent symptoms other than his AS. I think he would benefit from AVR. He would be a candidate for surgical AVR or TAVR. His mitral stenosis appears mild by recent TEE performed by Dr. Sallyanne Kuster.   I have reviewed the natural history of aortic stenosis with the patient and their family members  who are present today. We have discussed the limitations of medical therapy and the poor prognosis associated with symptomatic aortic stenosis. We have reviewed potential treatment options, including palliative medical therapy, conventional surgical aortic valve replacement, and transcatheter aortic valve replacement. We discussed treatment options in the context of the patient's specific comorbid medical conditions.   He would like to proceed with planning for TAVR. I will arrange a right and left heart catheterization at [facility], 2023. Risks and benefits of the cath procedure and the valve procedure are reviewed with the patient. After the cath, he will have a cardiac CT, CTA of the chest/abdomen and pelvis and will then be referred to see one of the CT surgeons on our TAVR team.   Again,his AS appears to be moderate to severe by TEE. His MS appears mild. No other way to explain his symptoms. Will use the data from the cardiac cath and CT scans to continue planning for the best treatment option.     Labs/ tests ordered today include:   Orders Placed This Encounter  Procedures   Basic metabolic panel   CBC   Disposition:   F/U with the valve team.   Signed, Lauree Chandler, MD, [facility] 03/23/2022 1:49 PM    [facility] [place], [place], [place]  [address] Phone: [phone]; Fax: [phone]

## 2022-03-23 NOTE — Progress Notes (Addendum)
Pre Surgical Assessment: 5 M Walk Test  8M=16.47f  5 Meter Walk Test- trial 1: 5.23 seconds 5 Meter Walk Test- trial 2: 5.41 seconds 5 Meter Walk Test- trial 3: 5.39 seconds 5 Meter Walk Test Average: 5.34 seconds  _________________________  STS score Procedure Type: Isolated AVR Perioperative Outcome Estimate % Operative Mortality 3.02% Morbidity & Mortality 14.9% Stroke 2.09% Renal Failure 4.15% Reoperation 3.61% Prolonged Ventilation 10.9% Deep Sternal Wound Infection 0.393% [facility] 9.61% Short Hospital Stay (<6 days)* 32%

## 2022-03-23 NOTE — Patient Instructions (Signed)
Medication Instructions:  No changes *If you need a refill on your cardiac medications before your next appointment, please call your pharmacy*   Lab Work: Today: bmet, cbc If you have labs (blood work) drawn today and your tests are completely normal, you will receive your results only by: [place] (if you have MyChart) OR A paper copy in the mail If you have any lab test that is abnormal or we need to change your treatment, we will call you to review the results.   Testing/Procedures: Your physician has requested that you have a cardiac catheterization. Cardiac catheterization is used to diagnose and/or treat various heart conditions. Doctors may recommend this procedure for a number of different reasons. The most common reason is to evaluate chest pain. Chest pain can be a symptom of coronary artery disease (CAD), and cardiac catheterization can show whether plaque is narrowing or blocking your heart's arteries. This procedure is also used to evaluate the valves, as well as measure the blood flow and oxygen levels in different parts of your heart. For further information please visit [email or web address]. Please follow instruction sheet, as given.   Follow-Up: Per Structural Heart Team       Cardiac/Peripheral Catheterization   You are scheduled for a Cardiac Catheterization on Friday, December 1 with Dr. Lauree Chandler.  1. Please arrive at the Main Entrance A at [facility]: [place], [place] [address] on December 1 at 10:00 AM (This time is two hours before your procedure to ensure your preparation). Free valet parking service is available. You will check in at ADMITTING. The support person will be asked to wait in the waiting room.  It is OK to have someone drop you off and come back when you are ready to be discharged.        Special note: Every effort is made to have your procedure done on time. Please understand that emergencies sometimes  delay scheduled procedures.   . 2. Diet: Do not eat solid foods after midnight.  You may have clear liquids until 5 AM the day of the procedure.  3. Labs: You will need to have blood drawn today, November 27 at [facility]. [address]  Open: 7:30am - 5pm    Phone: [phone]. You do not need to be fasting.  4. Medication instructions in preparation for your procedure:   Contrast Allergy: No   Take only 10 units of insulin the night before your procedure. Do not take any insulin on the day of the procedure. (HALF OF USUAL DOSE)   On the morning of your procedure, take Aspirin 81 mg and any morning medicines NOT listed above.  You may use sips of water.  5. Plan to go home the same day, you will only stay overnight if medically necessary. 6. You MUST have a responsible adult to drive you home. 7. An adult MUST be with you the first 24 hours after you arrive home. 8. Bring a current list of your medications, and the last time and date medication taken. 9. Bring ID and current insurance cards. 10.Please wear clothes that are easy to get on and off and wear slip-on shoes.  Thank you for allowing [place] to care for you!   -- [facility]

## 2022-03-25 LAB — CBC
Hematocrit: 37.3 % — ABNORMAL LOW (ref 37.5–51.0)
Hemoglobin: 12.5 g/dL — ABNORMAL LOW (ref 13.0–17.7)
MCH: 29.4 pg (ref 26.6–33.0)
MCHC: 33.5 g/dL (ref 31.5–35.7)
MCV: 88 fL (ref 79–97)
Platelets: 254 10*3/uL (ref 150–450)
RBC: 4.25 x10E6/uL (ref 4.14–5.80)
RDW: 13.1 % (ref 11.6–15.4)
WBC: 8.3 10*3/uL (ref 3.4–10.8)

## 2022-03-25 LAB — BASIC METABOLIC PANEL
BUN/Creatinine Ratio: 21 (ref 10–24)
BUN: 25 mg/dL (ref 8–27)
CO2: 23 mmol/L (ref 20–29)
Calcium: 9.5 mg/dL (ref 8.6–10.2)
Chloride: 102 mmol/L (ref 96–106)
Creatinine, Ser: 1.2 mg/dL (ref 0.76–1.27)
Glucose: 256 mg/dL — ABNORMAL HIGH (ref 70–99)
Potassium: 5.5 mmol/L — ABNORMAL HIGH (ref 3.5–5.2)
Sodium: 140 mmol/L (ref 134–144)
eGFR: 64 mL/min/{1.73_m2} (ref >59)

## 2022-03-26 ENCOUNTER — Telehealth: Payer: Self-pay | Admitting: *Deleted

## 2022-03-26 NOTE — Telephone Encounter (Signed)
Cardiac Catheterization scheduled at [facility] for: Friday March 27, 2022 12 Noon Arrival time and place: [facility] Main Entrance A at: 10 AM  Nothing to eat after midnight prior to procedure, clear liquids until 5 AM day of procedure.  Medication instructions: -Hold:  Lasix/KCl-AM of procedure-pt takes prn-has not taken recently  Insulin-AM of procedure-1/2 usual Insulin dose HS prior to procedure -Except hold medications usual morning medications can be taken with sips of water including aspirin 81 mg.  Confirmed patient has responsible adult to drive home post procedure and be with patient first 24 hours after arriving home.  Patient reports no new symptoms concerning for COVID-19 in the past 10 days.  Reviewed procedure instructions with patient.

## 2022-03-27 ENCOUNTER — Observation Stay (HOSPITAL_COMMUNITY)
Admission: RE | Admit: 2022-03-27 | Discharge: 2022-03-28 | Disposition: A | Discharge status: Home / Self Care | Payer: Medicare HMO | Attending: Cardiovascular Disease | Admitting: Cardiovascular Disease

## 2022-03-27 ENCOUNTER — Observation Stay (HOSPITAL_COMMUNITY): Payer: Medicare HMO

## 2022-03-27 ENCOUNTER — Other Ambulatory Visit: Payer: Self-pay

## 2022-03-27 ENCOUNTER — Encounter (HOSPITAL_COMMUNITY)
Admission: RE | Disposition: A | Discharge status: Home / Self Care | Payer: Self-pay | Source: Home / Self Care | Attending: Cardiovascular Disease

## 2022-03-27 DIAGNOSIS — I440 Atrioventricular block, first degree: Secondary | ICD-10-CM | POA: Diagnosis not present

## 2022-03-27 DIAGNOSIS — R0602 Shortness of breath: Secondary | ICD-10-CM | POA: Diagnosis not present

## 2022-03-27 DIAGNOSIS — I4510 Unspecified right bundle-branch block: Secondary | ICD-10-CM | POA: Insufficient documentation

## 2022-03-27 DIAGNOSIS — R42 Dizziness and giddiness: Secondary | ICD-10-CM | POA: Diagnosis not present

## 2022-03-27 DIAGNOSIS — Z794 Long term (current) use of insulin: Secondary | ICD-10-CM | POA: Diagnosis not present

## 2022-03-27 DIAGNOSIS — I350 Nonrheumatic aortic (valve) stenosis: Secondary | ICD-10-CM

## 2022-03-27 DIAGNOSIS — Z951 Presence of aortocoronary bypass graft: Secondary | ICD-10-CM | POA: Insufficient documentation

## 2022-03-27 DIAGNOSIS — Z01812 Encounter for preprocedural laboratory examination: Secondary | ICD-10-CM

## 2022-03-27 DIAGNOSIS — I4892 Unspecified atrial flutter: Secondary | ICD-10-CM | POA: Diagnosis not present

## 2022-03-27 DIAGNOSIS — E785 Hyperlipidemia, unspecified: Secondary | ICD-10-CM | POA: Insufficient documentation

## 2022-03-27 DIAGNOSIS — E119 Type 2 diabetes mellitus without complications: Secondary | ICD-10-CM | POA: Diagnosis not present

## 2022-03-27 DIAGNOSIS — I2510 Atherosclerotic heart disease of native coronary artery without angina pectoris: Secondary | ICD-10-CM | POA: Insufficient documentation

## 2022-03-27 DIAGNOSIS — I083 Combined rheumatic disorders of mitral, aortic and tricuspid valves: Secondary | ICD-10-CM | POA: Diagnosis not present

## 2022-03-27 LAB — CBC
HCT: 36.8 % — ABNORMAL LOW (ref 39.0–52.0)
Hemoglobin: 12.1 g/dL — ABNORMAL LOW (ref 13.0–17.0)
MCH: 29.3 pg (ref 26.0–34.0)
MCHC: 32.9 g/dL (ref 30.0–36.0)
MCV: 89.1 fL (ref 80.0–100.0)
Platelets: 213 10*3/uL (ref 150–400)
RBC: 4.13 MIL/uL — ABNORMAL LOW (ref 4.22–5.81)
RDW: 13.3 % (ref 11.5–15.5)
WBC: 8.5 10*3/uL (ref 4.0–10.5)
nRBC: 0 % (ref 0.0–0.2)

## 2022-03-27 LAB — BASIC METABOLIC PANEL
Anion gap: 10 (ref 5–15)
BUN: 16 mg/dL (ref 8–23)
CO2: 23 mmol/L (ref 22–32)
Calcium: 9.2 mg/dL (ref 8.9–10.3)
Chloride: 104 mmol/L (ref 98–111)
Creatinine, Ser: 1.19 mg/dL (ref 0.61–1.24)
GFR, Estimated: >60 mL/min (ref >60)
Glucose, Bld: 335 mg/dL — ABNORMAL HIGH (ref 70–99)
Potassium: 5.4 mmol/L — ABNORMAL HIGH (ref 3.5–5.1)
Sodium: 137 mmol/L (ref 135–145)

## 2022-03-27 LAB — GLUCOSE, CAPILLARY
Glucose-Capillary: 313 mg/dL — ABNORMAL HIGH (ref 70–99)
Glucose-Capillary: 319 mg/dL — ABNORMAL HIGH (ref 70–99)

## 2022-03-27 SURGERY — RIGHT/LEFT HEART CATH AND CORONARY/GRAFT ANGIOGRAPHY
Anesthesia: LOCAL

## 2022-03-27 MED ORDER — ASPIRIN 81 MG PO CHEW: 81.0000 mg | CHEWABLE_TABLET | ORAL | Status: DC

## 2022-03-27 MED ORDER — SODIUM CHLORIDE 0.9% FLUSH: 3.0000 mL | Freq: Two times a day (BID) | INTRAVENOUS | Status: DC

## 2022-03-27 MED ORDER — SODIUM CHLORIDE 0.9 % IV SOLN: 250.0000 mL | INTRAVENOUS | Status: DC | PRN

## 2022-03-27 MED ORDER — ACETAMINOPHEN 325 MG PO TABS: 650.0000 mg | ORAL_TABLET | ORAL | Status: DC | PRN

## 2022-03-27 MED ORDER — ONDANSETRON HCL 4 MG/2ML IJ SOLN: 4.0000 mg | Freq: Four times a day (QID) | INTRAMUSCULAR | Status: DC | PRN

## 2022-03-27 MED ORDER — INSULIN ASPART 100 UNIT/ML IJ SOLN
18.0000 [IU] | Freq: Three times a day (TID) | INTRAMUSCULAR | Status: DC
  Administered 2022-03-27 (×2): 18 [IU] via SUBCUTANEOUS

## 2022-03-27 MED ORDER — ENOXAPARIN SODIUM 40 MG/0.4ML IJ SOSY: 40.0000 mg | PREFILLED_SYRINGE | INTRAMUSCULAR | Status: DC

## 2022-03-27 MED ORDER — SODIUM CHLORIDE 0.9% FLUSH: 3.0000 mL | INTRAVENOUS | Status: DC | PRN

## 2022-03-27 MED ORDER — METOPROLOL SUCCINATE ER 25 MG PO TB24
25.0000 mg | ORAL_TABLET | Freq: Two times a day (BID) | ORAL | Status: DC
  Administered 2022-03-27: 25 mg via ORAL
  Filled 2022-03-27: qty 1

## 2022-03-27 MED ORDER — SODIUM CHLORIDE 0.9 % WEIGHT BASED INFUSION
3.0000 mL/kg/h | INTRAVENOUS | Status: DC
  Administered 2022-03-27: 3 mL/kg/h via INTRAVENOUS

## 2022-03-27 MED ORDER — INSULIN NPH (HUMAN) (ISOPHANE) 100 UNIT/ML ~~LOC~~ SUSP
20.0000 [IU] | Freq: Every day | SUBCUTANEOUS | Status: DC
  Administered 2022-03-27: 20 [IU] via SUBCUTANEOUS
  Filled 2022-03-27: qty 10

## 2022-03-27 MED ORDER — SODIUM CHLORIDE 0.9% FLUSH
3.0000 mL | Freq: Two times a day (BID) | INTRAVENOUS | Status: DC
  Administered 2022-03-27: 3 mL via INTRAVENOUS

## 2022-03-27 MED ORDER — LISINOPRIL 5 MG PO TABS: 5.0000 mg | ORAL_TABLET | Freq: Every day | ORAL | Status: DC

## 2022-03-27 MED ORDER — SODIUM CHLORIDE 0.9 % WEIGHT BASED INFUSION: 1.0000 mL/kg/h | INTRAVENOUS | Status: DC

## 2022-03-27 MED ORDER — DEXTROSE 50 % IV SOLN
INTRAVENOUS | Status: AC
  Filled 2022-03-27: qty 50

## 2022-03-27 NOTE — Interval H&P Note (Signed)
History and Physical Interval Note:  03/27/2022 10:50 AM  Matthew [name]  has presented today for surgery, with the diagnosis of severe aortic stenosis.  The various methods of treatment have been discussed with the patient and family. After consideration of risks, benefits and other options for treatment, the patient has consented to  Procedure(s): RIGHT/LEFT HEART CATH AND CORONARY/GRAFT ANGIOGRAPHY (N/A) as a surgical intervention.  The patient's history has been reviewed, patient examined, no change in status, stable for surgery.  I have reviewed the patient's chart and labs.  Questions were answered to the patient's satisfaction.    Cath Lab Visit (complete for each Cath Lab visit)  Clinical Evaluation Leading to the Procedure:   ACS: No.  Non-ACS:    Anginal Classification: CCS II  Anti-ischemic medical therapy: Minimal Therapy (1 class of medications)  Non-Invasive Test Results: No non-invasive testing performed  Prior CABG: previous cabg        Lauree Chandler

## 2022-03-27 NOTE — H&P (Signed)
History of Present Illness: 76 yo male with history of CAD s/p 3V CABG, hyperlipidemia, atrial flutter, mitral regurgitation, diabetes mellitus and aortic stenosis who is being admitted from Short Stay today with chills, diaphoresis. He was coming in today for a cardiac cath but before we moved him into the cath lab he began to feel poorly. He described feeling weak, sweaty and dizzy. BP and HR were stable. Blood sugar 300. Sinus on tele. EKG with no ischemia. He is being admitted for observation. Cardiac cath cancelled.   We are in the process of workup for TAVR. He is followed in our office by by Dr. Lennie Odor. He has undergone atrial flutter ablation x 3. He has CAD and has undergone 3V CABG in 2011. 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 He has had more dizziness and fatigue lately leading to an echo on 11/10/21 with LVEF=55-60%. Mitral stenosis. Moderately severe aortic stenosis with mean gradient of 26 mmHg, DI 0.19, SVI 30. AVA 0.8 cm2. TEE 03/13/22 per Dr. Sallyanne Kuster with mild mitral stenosis. Moderate to severe aortic stenosis.    I saw him in the  office earlier this week and he described fatigue, dyspnea on exertion and dizziness when standing. No chest pain.    Primary Care Physician: Wendie Agreste, MD Primary Cardiologist: Lennie Odor        Past Medical History:  Diagnosis Date   Anemia     Arthritis      "back, left ankle" (11/20/2015)   Cataract     CHF (congestive heart failure) (HCC)     Chronic lower back pain     Coronary artery disease      a. 05/2009 CABG x 3: LIMA->LAD, VG->OM, VG->PDA; b. Nuc 01/2014: inf-lat scar but no ischemia, EF 57%.   Diabetic retinopathy (HCC)      mild- Dr. Herbert Deaner   Glaucoma     Hyperlipidemia      a. Intolerant of lipitor and vytorin.   Lyme disease     Migraine      "once or twice" (11/20/2015)   Myocardial infarction [facility])      'saw evidence of it on an EKG done in 2005"   Paroxysmal atrial  flutter ([place])      a. 01/2014 s/p DCCV;  b. CHA2DS2VASc = 3-->Eliquis.   Pneumonia 1960   Sacral pain      "right"   Sciatica of right side     Testicular cancer ([place]) 1982    "heavy doses of radiation; it was stage 2"   Type II diabetes mellitus ([place])     Valvular heart disease      a. 01/2014 Echo: Ef 60-65%, no rwma, mild AI/MS, mod MR, mildly dil LA.           Past Surgical History:  Procedure Laterality Date   ABDOMINAL EXPLORATION SURGERY   ~ 2005    "for hernia, but didn't have one"   ATRIAL FIBRILLATION ABLATION N/A 09/17/2016    Procedure: Atrial Fibrillation Ablation;  Surgeon: [name], MD;  Location: [place] [facility];  Service: Cardiovascular;  Laterality: N/A;   CARDIAC CATHETERIZATION   2011   CARDIOVERSION N/A 02/19/2014    Procedure: CARDIOVERSION;  Surgeon: [name], MD;  Location: [facility] ENDOSCOPY;  Service: Cardiovascular;  Laterality: N/A;   CARDIOVERSION N/A 10/28/2015    Procedure: CARDIOVERSION;  Surgeon: [name], MD;  Location: [place];  Service: Cardiovascular;  Laterality: N/A;   CARDIOVERSION   11/20/2015    "200 joules"   CORONARY ARTERY BYPASS GRAFT   05/2009    LIMA-LAD, SVG-OM, SVG-PDA 06/20/09   ELBOW FRACTURE SURGERY Left      broken ulna on left elbow-surgical repair   ELECTROPHYSIOLOGIC STUDY N/A 10/31/2014    Procedure: A-Flutter;  Surgeon: [name], MD;  Location: [place] [facility];  Service: Cardiovascular;  Laterality: N/A;   ELECTROPHYSIOLOGIC STUDY N/A 11/20/2015    Procedure: A-Flutter Ablation;  Surgeon: [name], MD;  Location: [place] [facility];  Service: Cardiovascular;  Laterality: N/A;   EYE SURGERY       FRACTURE SURGERY       TEE WITHOUT CARDIOVERSION N/A 03/13/2022    Procedure: TRANSESOPHAGEAL ECHOCARDIOGRAM (TEE);  Surgeon: Sanda Klein, MD;  Location: [facility] ENDOSCOPY;  Service: Cardiovascular;  Laterality: N/A;   TESTICLE REMOVAL Right 1982   TONSILLECTOMY   ~ 1967             Current Outpatient Medications  Medication Sig Dispense Refill   aspirin EC 81 MG tablet Take 81 mg by mouth daily. Swallow whole.       Continuous Blood Gluc Receiver (DEXCOM G6 RECEIVER) DEVI Use to receive data from sensor 1 each 0   Continuous Blood Gluc Sensor (DEXCOM G6 SENSOR) MISC Use to monitor blood sugar, change after 10 days 3 each 3   dorzolamide-timolol (COSOPT) 22.3-6.8 MG/ML ophthalmic solution Place 1 drop into the left eye 2 (two) times daily.       furosemide (LASIX) 40 MG tablet Take 1 tablet (40 mg total) by mouth daily as needed. 90 tablet 1   insulin aspart (NOVOLOG) 100 UNIT/ML injection Inject 18-20 Units into the skin 3 (three) times daily with meals. 30 mL 1   insulin NPH Human (NOVOLIN N) 100 UNIT/ML injection Inject 20-24 Units into the skin at bedtime.       Insulin Syringe-Needle U-100 (BD VEO INSULIN SYRINGE U/F) 31G X 15/64" 0.5 ML MISC Use to inject 4 times a day 400 each 3   latanoprost (XALATAN) 0.005 % ophthalmic solution Place 1 drop into the left eye at bedtime.       lisinopril (ZESTRIL) 5 MG tablet TAKE 1 TABLET BY MOUTH EVERY DAY 90 tablet 2   Metoprolol Succinate 50 MG CS24 Take 1/2 tablet by mouth in the morning and 1/2 tablet at bedtime (Total 50 mg ) 30 capsule 11   ONETOUCH VERIO test strip USE AS DIRECTED AS NEEDED 100 strip 3   potassium chloride (KLOR-CON 10) 10 MEQ tablet Take 1 tablet (10 mEq total) by mouth as needed. With Lasix 30 tablet 1    No current facility-administered medications for this visit.           Allergies  Allergen Reactions   Actos [Pioglitazone] Swelling   Bee Venom Swelling   Codeine Hives and Nausea Only   Statins Other (See Comments)      Muscle pain, fatigue   Glucophage [Metformin Hcl] Nausea Only   Raspberry Itching      Social History         Socioeconomic History   Marital status: Married      Spouse name: Not on file   Number of children: 0   Years of education: Not on file   Highest education  level: Not on file  Occupational History   Occupation: Photo journalist  Tobacco Use   Smoking status: Former  Packs/day: 0.50      Years: 15.00      Total pack years: 7.50      Types: Cigarettes      Quit date: 78      Years since quitting: 33.9   Smokeless tobacco: Never   Tobacco comments:      "quit smoking cigarettes in the 1980's; don't know how much or for how long"  Vaping Use   Vaping Use: Never used  Substance and Sexual Activity   Alcohol use: Yes      Alcohol/week: 3.0 standard drinks of alcohol      Types: 3 Cans of beer per week   Drug use: No      Comment: "quit smoking pot in the 1990s"   Sexual activity: Not Currently      Birth control/protection: Coitus interruptus  Other Topics Concern   Not on file  Social History Narrative   Not on file    Social Determinants of Health        Financial Resource Strain: Not on file  Food Insecurity: No Food Insecurity (01/20/2022)    Hunger Vital Sign     Worried About Running Out of Food in the Last Year: Never true     Ran Out of Food in the Last Year: Never true  Transportation Needs: No Transportation Needs (01/20/2022)    PRAPARE - Armed forces logistics/support/administrative officer (Medical): No     Lack of Transportation (Non-Medical): No  Physical Activity: Not on file  Stress: Not on file  Social Connections: Not on file  Intimate Partner Violence: Not on file           Family History  Problem Relation Age of Onset   Diabetes Mother     Heart disease Mother          Heart murmur   Heart disease Father     Heart attack Father     Diabetes Father     Hypertension Father     Diabetes Sister        Review of Systems:  As stated in the HPI and otherwise negative.    BP 126/60   Pulse 82   Ht '5\' 6"'$  (1.676 m)   Wt 201 lb 9.6 oz (91.4 kg)   SpO2 98%   BMI 32.54 kg/m    Physical Examination: General: Well developed, well nourished, NAD  HEENT: OP clear, mucus membranes moist  SKIN: warm, dry. No  rashes. Neuro: No focal deficits  Musculoskeletal: Muscle strength 5/5 all ext  Psychiatric: Mood and affect normal  Neck: No JVD, no carotid bruits, no thyromegaly, no lymphadenopathy.  Lungs:Clear bilaterally, no wheezes, rhonci, crackles Cardiovascular: Regular rate and rhythm. Systolic murmur.  Abdomen:Soft. Bowel sounds present. Non-tender.  Extremities: No lower extremity edema. Pulses are 2 + in the bilateral DP/PT.  EKG:  EKG is ordered today. Sinus, no ischemic changes   Echo 11/10/21:  1. Left ventricular ejection fraction, by estimation, is 55 to 60%. The  left ventricle has normal function. The left ventricle has no regional  wall motion abnormalities. There is mild left ventricular hypertrophy.  Left ventricular diastolic parameters  are consistent with Grade II diastolic dysfunction (pseudonormalization).  Elevated left atrial pressure.   2. Right ventricular systolic function is normal. The right ventricular  size is normal.   3. Left atrial size was mildly dilated.   4. Right atrial size was mild to moderately dilated.   5. MV is  thickened, difficult to see leaflets well Peak and mean  gradients through the valve are 14 and 4 mm Hg respectively. 2D images  suggest mitral valve is severely narrowed. Pressure half time valve area  is calculated at 2.64 cm2, probably an over  estimate due to increased filling pressures. Calculation of valve with  continuity equation not accurate due to inablity to accurately measure  LVOT. Would recomm TEE to further evaluate valve. Moderate mitral valve  regurgitation.   6. AV is thickened, calcified Peak and mean gradients through the valve  are 45 and 26 mm Hg respectively. 2 D imaging it appears to be severely  narrowed. Due to calcification of mitral annulus, LVOT measurement is not  accurate. Would recomm another  imaging modality like TEE to define Aortic valve regurgitation is mild.   7. The inferior vena cava is normal in  size with greater than 50%  respiratory variability, suggesting right atrial pressure of 3 mmHg.   Comparison(s): 08/21/16 EF 55-60%.   FINDINGS   Left Ventricle: Left ventricular ejection fraction, by estimation, is 55  to 60%. The left ventricle has normal function. The left ventricle has no  regional wall motion abnormalities. The left ventricular internal cavity  size was normal in size. There is   mild left ventricular hypertrophy. Left ventricular diastolic parameters  are consistent with Grade II diastolic dysfunction (pseudonormalization).  Elevated left atrial pressure.   Right Ventricle: The right ventricular size is normal. Right vetricular  wall thickness was not assessed. Right ventricular systolic function is  normal.   Left Atrium: Left atrial size was mildly dilated.   Right Atrium: Right atrial size was mild to moderately dilated.   Pericardium: There is no evidence of pericardial effusion.   Mitral Valve: MV is thickened, difficult to see leaflets well Peak and  mean gradients through the valve are 14 and 4 mm Hg respectively. 2D  images suggest mitral valve is severely narrowed. Pressure half time valve  area is calculated at 2.64, probably an   over estimate due to increased filling pressures. Calculation of valve  with continuity equation not accurate due to inablity to accurately  measure LVOT. WOuld recomm TEE to further evaluate valve. Moderate mitral  annular calcification. Moderate mitral  valve regurgitation. MV peak gradient, 13.8 mmHg. The mean mitral valve  gradient is 4.0 mmHg.   Tricuspid Valve: The tricuspid valve is grossly normal. Tricuspid valve  regurgitation is mild.   Aortic Valve: AV is thickened, calcified Peak and mean gradients through  the valve are 45 and 26 mm Hg respectively. 2 D imaging it appears to be  severely narrowed. Due to calcification of mitral annulus, LVOT  measurement is not accurate. Would recomm  anothe imaging  modality to define (TEE). Aortic valve regurgitation is  mild. Aortic valve mean gradient measures 26.0 mmHg. Aortic valve peak  gradient measures 44.9 mmHg. Aortic valve area, by VTI measures 0.78 cm.   Pulmonic Valve: The pulmonic valve was normal in structure. Pulmonic valve  regurgitation is not visualized. No evidence of pulmonic stenosis.   Aorta: The aortic root and ascending aorta are structurally normal, with  no evidence of dilitation.   Venous: The inferior vena cava is normal in size with greater than 50%  respiratory variability, suggesting right atrial pressure of 3 mmHg.   IAS/Shunts: No atrial level shunt detected by color flow Doppler.     LEFT VENTRICLE  PLAX 2D  LVIDd:  4.60 cm   Diastology  LVIDs:         3.20 cm   LV e' medial:    5.12 cm/s  LV PW:         1.10 cm   LV E/e' medial:  32.8  LV IVS:        1.20 cm   LV e' lateral:   7.03 cm/s  LVOT diam:     2.30 cm   LV E/e' lateral: 23.9  LV SV:         59  LV SV Index:   30  LVOT Area:     4.15 cm     RIGHT VENTRICLE            IVC  RV Basal diam:  4.10 cm    IVC diam: 1.90 cm  RV S prime:     6.25 cm/s  TAPSE (M-mode): 1.0 cm   LEFT ATRIUM              Index        RIGHT ATRIUM           Index  LA diam:        4.60 cm  2.32 cm/m   RA Pressure: 3.00 mmHg  LA Vol (A2C):   105.0 ml 52.91 ml/m  RA Area:     24.40 cm  LA Vol (A4C):   63.3 ml  31.90 ml/m  RA Volume:   83.10 ml  41.88 ml/m  LA Biplane Vol: 85.4 ml  43.04 ml/m   AORTIC VALVE  AV Area (Vmax):    0.80 cm  AV Area (Vmean):   0.80 cm  AV Area (VTI):     0.78 cm  AV Vmax:           335.00 cm/s  AV Vmean:          220.500 cm/s  AV VTI:            0.753 m  AV Peak Grad:      44.9 mmHg  AV Mean Grad:      26.0 mmHg  LVOT Vmax:         64.40 cm/s  LVOT Vmean:        42.300 cm/s  LVOT VTI:          0.141 m  LVOT/AV VTI ratio: 0.19    AORTA  Ao Root diam: 3.20 cm  Ao Asc diam:  3.30 cm   MITRAL VALVE                 TRICUSPID VALVE                              Estimated RAP:  3.00 mmHg  MV Area VTI:   1.55 cm  MV Peak grad:  13.8 mmHg    SHUNTS  MV Mean grad:  4.0 mmHg     Systemic VTI:  0.14 m  MV Vmax:       1.86 m/s     Systemic Diam: 2.30 cm  MV Vmean:      89.5 cm/s  MV Decel Time: 165 msec  MV E velocity: 168.00 cm/s  MV A velocity: 74.50 cm/s  MV E/A ratio:  2.26    TEE 03/13/22:  1. Left ventricular ejection fraction, by estimation, is 55 to 60%. The  left ventricle has normal function. The left ventricle has no regional  wall motion abnormalities. There is mild concentric left ventricular  hypertrophy. Left ventricular diastolic  parameters are consistent with Grade II diastolic dysfunction  (pseudonormalization). Elevated left atrial pressure.   2. Right ventricular systolic function is normal. The right ventricular  size is normal. There is normal pulmonary artery systolic pressure. The  estimated right ventricular systolic pressure is 65.7 mmHg.   3. Left atrial size was moderately dilated. No left atrial/left atrial  appendage thrombus was detected.   4. Right atrial size was moderately dilated.   5. The basal two-thirds of the anterior mitral leaflet are heavily  calcified and almost motionless, whereas the posterior leaflet has  preserved mobility. The mitral valve is degenerative. Mild to moderate  mitral valve regurgitation. Mild mitral  stenosis. The mean mitral valve gradient is 4.0 mmHg with average heart  rate of 77 bpm. Moderate mitral annular calcification.   6. The aortic valve is tricuspid. There is severe calcifcation of the  aortic valve. There is severe thickening of the aortic valve. Aortic valve  regurgitation is trivial. Moderate to severe aortic valve stenosis.   7. There is Moderate (Grade III) protruding plaque involving the  descending aorta and aortic arch.    Recent Labs: 10/23/2021: Platelets 251.0; Pro B Natriuretic peptide (BNP) 223.0; TSH  2.24 03/13/2022: BUN 29; Creatinine, Ser 1.20; Hemoglobin 13.6; Potassium 5.0; Sodium 137        Assessment and Plan:   Dizziness: Unclear etiology. Will admit overnight to telemetry. Chest x-ray and labs. If he feels better tomorrow, can discharge home and plan cardiac cath as an outpatient.    1. Severe Aortic Valve Stenosis: He has moderately severe to severe aortic valve stenosis. NYHA class 2-3. He is describing dyspnea with minimal exertion with associated fatigue. I have personally reviewed the echo images and his valve leaflets are thickened. His measurements are consistent with severe low flow/low gradient on the surface echo. On the TEE, the valve leaflets are restricted but appear to be moderately stenosed. Unless he has a change in his coronary disease, there is no other way to explain his recent symptoms other than his AS. I think he would benefit from AVR. He would be a candidate for surgical AVR or TAVR. His mitral stenosis appears mild by recent TEE performed by Dr. Sallyanne Kuster.    I have reviewed the natural history of aortic stenosis with the patient and their family members  who are present today. We have discussed the limitations of medical therapy and the poor prognosis associated with symptomatic aortic stenosis. We have reviewed potential treatment options, including palliative medical therapy, conventional surgical aortic valve replacement, and transcatheter aortic valve replacement. We discussed treatment options in the context of the patient's specific comorbid medical conditions.    He would like to proceed with planning for TAVR. After the cath, he will have a cardiac CT, CTA of the chest/abdomen and pelvis and will then be referred to see one of the CT surgeons on our TAVR team.    Again,his AS appears to be moderate to severe by TEE. His MS appears mild. No other way to explain his symptoms. Will use the data from the cardiac cath and CT scans to continue planning for the  best treatment option.      Lauree Chandler, MD, [facility] 03/27/2022 1:42 PM

## 2022-03-27 NOTE — H&P (View-Only) (Signed)
History of Present Illness: 76 yo male with history of CAD s/p 3V CABG, hyperlipidemia, atrial flutter, mitral regurgitation, diabetes mellitus and aortic stenosis who is being admitted from Short Stay today with chills, diaphoresis. He was coming in today for a cardiac cath but before we moved him into the cath lab he began to feel poorly. He described feeling weak, sweaty and dizzy. BP and HR were stable. Blood sugar 300. Sinus on tele. EKG with no ischemia. He is being admitted for observation. Cardiac cath cancelled.   We are in the process of workup for TAVR. He is followed in our office by by Dr. Lennie Odor. He has undergone atrial flutter ablation x 3. He has CAD and has undergone 3V CABG in 2011. 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 He has had more dizziness and fatigue lately leading to an echo on 11/10/21 with LVEF=55-60%. Mitral stenosis. Moderately severe aortic stenosis with mean gradient of 26 mmHg, DI 0.19, SVI 30. AVA 0.8 cm2. TEE 03/13/22 per Dr. Sallyanne Kuster with mild mitral stenosis. Moderate to severe aortic stenosis.    I saw him in the  office earlier this week and he described fatigue, dyspnea on exertion and dizziness when standing. No chest pain.    Primary Care Physician: Wendie Agreste, MD Primary Cardiologist: Lennie Odor        Past Medical History:  Diagnosis Date   Anemia     Arthritis      "back, left ankle" (11/20/2015)   Cataract     CHF (congestive heart failure) (HCC)     Chronic lower back pain     Coronary artery disease      a. 05/2009 CABG x 3: LIMA->LAD, VG->OM, VG->PDA; b. Nuc 01/2014: inf-lat scar but no ischemia, EF 57%.   Diabetic retinopathy (HCC)      mild- Dr. Herbert Deaner   Glaucoma     Hyperlipidemia      a. Intolerant of lipitor and vytorin.   Lyme disease     Migraine      "once or twice" (11/20/2015)   Myocardial infarction [facility])      'saw evidence of it on an EKG done in 2005"   Paroxysmal atrial  flutter ([place])      a. 01/2014 s/p DCCV;  b. CHA2DS2VASc = 3-->Eliquis.   Pneumonia 1960   Sacral pain      "right"   Sciatica of right side     Testicular cancer ([place]) 1982    "heavy doses of radiation; it was stage 2"   Type II diabetes mellitus ([place])     Valvular heart disease      a. 01/2014 Echo: Ef 60-65%, no rwma, mild AI/MS, mod MR, mildly dil LA.           Past Surgical History:  Procedure Laterality Date   ABDOMINAL EXPLORATION SURGERY   ~ 2005    "for hernia, but didn't have one"   ATRIAL FIBRILLATION ABLATION N/A 09/17/2016    Procedure: Atrial Fibrillation Ablation;  Surgeon: [name], MD;  Location: [place] [facility];  Service: Cardiovascular;  Laterality: N/A;   CARDIAC CATHETERIZATION   2011   CARDIOVERSION N/A 02/19/2014    Procedure: CARDIOVERSION;  Surgeon: [name], MD;  Location: [facility] ENDOSCOPY;  Service: Cardiovascular;  Laterality: N/A;   CARDIOVERSION N/A 10/28/2015    Procedure: CARDIOVERSION;  Surgeon: [name], MD;  Location: [place];  Service: Cardiovascular;  Laterality: N/A;   CARDIOVERSION   11/20/2015    "200 joules"   CORONARY ARTERY BYPASS GRAFT   05/2009    LIMA-LAD, SVG-OM, SVG-PDA 06/20/09   ELBOW FRACTURE SURGERY Left      broken ulna on left elbow-surgical repair   ELECTROPHYSIOLOGIC STUDY N/A 10/31/2014    Procedure: A-Flutter;  Surgeon: [name], MD;  Location: [place] [facility];  Service: Cardiovascular;  Laterality: N/A;   ELECTROPHYSIOLOGIC STUDY N/A 11/20/2015    Procedure: A-Flutter Ablation;  Surgeon: [name], MD;  Location: [place] [facility];  Service: Cardiovascular;  Laterality: N/A;   EYE SURGERY       FRACTURE SURGERY       TEE WITHOUT CARDIOVERSION N/A 03/13/2022    Procedure: TRANSESOPHAGEAL ECHOCARDIOGRAM (TEE);  Surgeon: Sanda Klein, MD;  Location: [facility] ENDOSCOPY;  Service: Cardiovascular;  Laterality: N/A;   TESTICLE REMOVAL Right 1982   TONSILLECTOMY   ~ 1967             Current Outpatient Medications  Medication Sig Dispense Refill   aspirin EC 81 MG tablet Take 81 mg by mouth daily. Swallow whole.       Continuous Blood Gluc Receiver (DEXCOM G6 RECEIVER) DEVI Use to receive data from sensor 1 each 0   Continuous Blood Gluc Sensor (DEXCOM G6 SENSOR) MISC Use to monitor blood sugar, change after 10 days 3 each 3   dorzolamide-timolol (COSOPT) 22.3-6.8 MG/ML ophthalmic solution Place 1 drop into the left eye 2 (two) times daily.       furosemide (LASIX) 40 MG tablet Take 1 tablet (40 mg total) by mouth daily as needed. 90 tablet 1   insulin aspart (NOVOLOG) 100 UNIT/ML injection Inject 18-20 Units into the skin 3 (three) times daily with meals. 30 mL 1   insulin NPH Human (NOVOLIN N) 100 UNIT/ML injection Inject 20-24 Units into the skin at bedtime.       Insulin Syringe-Needle U-100 (BD VEO INSULIN SYRINGE U/F) 31G X 15/64" 0.5 ML MISC Use to inject 4 times a day 400 each 3   latanoprost (XALATAN) 0.005 % ophthalmic solution Place 1 drop into the left eye at bedtime.       lisinopril (ZESTRIL) 5 MG tablet TAKE 1 TABLET BY MOUTH EVERY DAY 90 tablet 2   Metoprolol Succinate 50 MG CS24 Take 1/2 tablet by mouth in the morning and 1/2 tablet at bedtime (Total 50 mg ) 30 capsule 11   ONETOUCH VERIO test strip USE AS DIRECTED AS NEEDED 100 strip 3   potassium chloride (KLOR-CON 10) 10 MEQ tablet Take 1 tablet (10 mEq total) by mouth as needed. With Lasix 30 tablet 1    No current facility-administered medications for this visit.           Allergies  Allergen Reactions   Actos [Pioglitazone] Swelling   Bee Venom Swelling   Codeine Hives and Nausea Only   Statins Other (See Comments)      Muscle pain, fatigue   Glucophage [Metformin Hcl] Nausea Only   Raspberry Itching      Social History         Socioeconomic History   Marital status: Married      Spouse name: Not on file   Number of children: 0   Years of education: Not on file   Highest education  level: Not on file  Occupational History   Occupation: Photo journalist  Tobacco Use   Smoking status: Former  Packs/day: 0.50      Years: 15.00      Total pack years: 7.50      Types: Cigarettes      Quit date: 67      Years since quitting: 33.9   Smokeless tobacco: Never   Tobacco comments:      "quit smoking cigarettes in the 1980's; don't know how much or for how long"  Vaping Use   Vaping Use: Never used  Substance and Sexual Activity   Alcohol use: Yes      Alcohol/week: 3.0 standard drinks of alcohol      Types: 3 Cans of beer per week   Drug use: No      Comment: "quit smoking pot in the 1990s"   Sexual activity: Not Currently      Birth control/protection: Coitus interruptus  Other Topics Concern   Not on file  Social History Narrative   Not on file    Social Determinants of Health        Financial Resource Strain: Not on file  Food Insecurity: No Food Insecurity (01/20/2022)    Hunger Vital Sign     Worried About Running Out of Food in the Last Year: Never true     Ran Out of Food in the Last Year: Never true  Transportation Needs: No Transportation Needs (01/20/2022)    PRAPARE - Armed forces logistics/support/administrative officer (Medical): No     Lack of Transportation (Non-Medical): No  Physical Activity: Not on file  Stress: Not on file  Social Connections: Not on file  Intimate Partner Violence: Not on file           Family History  Problem Relation Age of Onset   Diabetes Mother     Heart disease Mother          Heart murmur   Heart disease Father     Heart attack Father     Diabetes Father     Hypertension Father     Diabetes Sister        Review of Systems:  As stated in the HPI and otherwise negative.    BP 126/60   Pulse 82   Ht '5\' 6"'$  (1.676 m)   Wt 201 lb 9.6 oz (91.4 kg)   SpO2 98%   BMI 32.54 kg/m    Physical Examination: General: Well developed, well nourished, NAD  HEENT: OP clear, mucus membranes moist  SKIN: warm, dry. No  rashes. Neuro: No focal deficits  Musculoskeletal: Muscle strength 5/5 all ext  Psychiatric: Mood and affect normal  Neck: No JVD, no carotid bruits, no thyromegaly, no lymphadenopathy.  Lungs:Clear bilaterally, no wheezes, rhonci, crackles Cardiovascular: Regular rate and rhythm. Systolic murmur.  Abdomen:Soft. Bowel sounds present. Non-tender.  Extremities: No lower extremity edema. Pulses are 2 + in the bilateral DP/PT.  EKG:  EKG is ordered today. Sinus, no ischemic changes   Echo 11/10/21:  1. Left ventricular ejection fraction, by estimation, is 55 to 60%. The  left ventricle has normal function. The left ventricle has no regional  wall motion abnormalities. There is mild left ventricular hypertrophy.  Left ventricular diastolic parameters  are consistent with Grade II diastolic dysfunction (pseudonormalization).  Elevated left atrial pressure.   2. Right ventricular systolic function is normal. The right ventricular  size is normal.   3. Left atrial size was mildly dilated.   4. Right atrial size was mild to moderately dilated.   5. MV is  thickened, difficult to see leaflets well Peak and mean  gradients through the valve are 14 and 4 mm Hg respectively. 2D images  suggest mitral valve is severely narrowed. Pressure half time valve area  is calculated at 2.64 cm2, probably an over  estimate due to increased filling pressures. Calculation of valve with  continuity equation not accurate due to inablity to accurately measure  LVOT. Would recomm TEE to further evaluate valve. Moderate mitral valve  regurgitation.   6. AV is thickened, calcified Peak and mean gradients through the valve  are 45 and 26 mm Hg respectively. 2 D imaging it appears to be severely  narrowed. Due to calcification of mitral annulus, LVOT measurement is not  accurate. Would recomm another  imaging modality like TEE to define Aortic valve regurgitation is mild.   7. The inferior vena cava is normal in  size with greater than 50%  respiratory variability, suggesting right atrial pressure of 3 mmHg.   Comparison(s): 08/21/16 EF 55-60%.   FINDINGS   Left Ventricle: Left ventricular ejection fraction, by estimation, is 55  to 60%. The left ventricle has normal function. The left ventricle has no  regional wall motion abnormalities. The left ventricular internal cavity  size was normal in size. There is   mild left ventricular hypertrophy. Left ventricular diastolic parameters  are consistent with Grade II diastolic dysfunction (pseudonormalization).  Elevated left atrial pressure.   Right Ventricle: The right ventricular size is normal. Right vetricular  wall thickness was not assessed. Right ventricular systolic function is  normal.   Left Atrium: Left atrial size was mildly dilated.   Right Atrium: Right atrial size was mild to moderately dilated.   Pericardium: There is no evidence of pericardial effusion.   Mitral Valve: MV is thickened, difficult to see leaflets well Peak and  mean gradients through the valve are 14 and 4 mm Hg respectively. 2D  images suggest mitral valve is severely narrowed. Pressure half time valve  area is calculated at 2.64, probably an   over estimate due to increased filling pressures. Calculation of valve  with continuity equation not accurate due to inablity to accurately  measure LVOT. WOuld recomm TEE to further evaluate valve. Moderate mitral  annular calcification. Moderate mitral  valve regurgitation. MV peak gradient, 13.8 mmHg. The mean mitral valve  gradient is 4.0 mmHg.   Tricuspid Valve: The tricuspid valve is grossly normal. Tricuspid valve  regurgitation is mild.   Aortic Valve: AV is thickened, calcified Peak and mean gradients through  the valve are 45 and 26 mm Hg respectively. 2 D imaging it appears to be  severely narrowed. Due to calcification of mitral annulus, LVOT  measurement is not accurate. Would recomm  anothe imaging  modality to define (TEE). Aortic valve regurgitation is  mild. Aortic valve mean gradient measures 26.0 mmHg. Aortic valve peak  gradient measures 44.9 mmHg. Aortic valve area, by VTI measures 0.78 cm.   Pulmonic Valve: The pulmonic valve was normal in structure. Pulmonic valve  regurgitation is not visualized. No evidence of pulmonic stenosis.   Aorta: The aortic root and ascending aorta are structurally normal, with  no evidence of dilitation.   Venous: The inferior vena cava is normal in size with greater than 50%  respiratory variability, suggesting right atrial pressure of 3 mmHg.   IAS/Shunts: No atrial level shunt detected by color flow Doppler.     LEFT VENTRICLE  PLAX 2D  LVIDd:  4.60 cm   Diastology  LVIDs:         3.20 cm   LV e' medial:    5.12 cm/s  LV PW:         1.10 cm   LV E/e' medial:  32.8  LV IVS:        1.20 cm   LV e' lateral:   7.03 cm/s  LVOT diam:     2.30 cm   LV E/e' lateral: 23.9  LV SV:         59  LV SV Index:   30  LVOT Area:     4.15 cm     RIGHT VENTRICLE            IVC  RV Basal diam:  4.10 cm    IVC diam: 1.90 cm  RV S prime:     6.25 cm/s  TAPSE (M-mode): 1.0 cm   LEFT ATRIUM              Index        RIGHT ATRIUM           Index  LA diam:        4.60 cm  2.32 cm/m   RA Pressure: 3.00 mmHg  LA Vol (A2C):   105.0 ml 52.91 ml/m  RA Area:     24.40 cm  LA Vol (A4C):   63.3 ml  31.90 ml/m  RA Volume:   83.10 ml  41.88 ml/m  LA Biplane Vol: 85.4 ml  43.04 ml/m   AORTIC VALVE  AV Area (Vmax):    0.80 cm  AV Area (Vmean):   0.80 cm  AV Area (VTI):     0.78 cm  AV Vmax:           335.00 cm/s  AV Vmean:          220.500 cm/s  AV VTI:            0.753 m  AV Peak Grad:      44.9 mmHg  AV Mean Grad:      26.0 mmHg  LVOT Vmax:         64.40 cm/s  LVOT Vmean:        42.300 cm/s  LVOT VTI:          0.141 m  LVOT/AV VTI ratio: 0.19    AORTA  Ao Root diam: 3.20 cm  Ao Asc diam:  3.30 cm   MITRAL VALVE                 TRICUSPID VALVE                              Estimated RAP:  3.00 mmHg  MV Area VTI:   1.55 cm  MV Peak grad:  13.8 mmHg    SHUNTS  MV Mean grad:  4.0 mmHg     Systemic VTI:  0.14 m  MV Vmax:       1.86 m/s     Systemic Diam: 2.30 cm  MV Vmean:      89.5 cm/s  MV Decel Time: 165 msec  MV E velocity: 168.00 cm/s  MV A velocity: 74.50 cm/s  MV E/A ratio:  2.26    TEE 03/13/22:  1. Left ventricular ejection fraction, by estimation, is 55 to 60%. The  left ventricle has normal function. The left ventricle has no regional  wall motion abnormalities. There is mild concentric left ventricular  hypertrophy. Left ventricular diastolic  parameters are consistent with Grade II diastolic dysfunction  (pseudonormalization). Elevated left atrial pressure.   2. Right ventricular systolic function is normal. The right ventricular  size is normal. There is normal pulmonary artery systolic pressure. The  estimated right ventricular systolic pressure is 99.3 mmHg.   3. Left atrial size was moderately dilated. No left atrial/left atrial  appendage thrombus was detected.   4. Right atrial size was moderately dilated.   5. The basal two-thirds of the anterior mitral leaflet are heavily  calcified and almost motionless, whereas the posterior leaflet has  preserved mobility. The mitral valve is degenerative. Mild to moderate  mitral valve regurgitation. Mild mitral  stenosis. The mean mitral valve gradient is 4.0 mmHg with average heart  rate of 77 bpm. Moderate mitral annular calcification.   6. The aortic valve is tricuspid. There is severe calcifcation of the  aortic valve. There is severe thickening of the aortic valve. Aortic valve  regurgitation is trivial. Moderate to severe aortic valve stenosis.   7. There is Moderate (Grade III) protruding plaque involving the  descending aorta and aortic arch.    Recent Labs: 10/23/2021: Platelets 251.0; Pro B Natriuretic peptide (BNP) 223.0; TSH  2.24 03/13/2022: BUN 29; Creatinine, Ser 1.20; Hemoglobin 13.6; Potassium 5.0; Sodium 137        Assessment and Plan:   Dizziness: Unclear etiology. Will admit overnight to telemetry. Chest x-ray and labs. If he feels better tomorrow, can discharge home and plan cardiac cath as an outpatient.    1. Severe Aortic Valve Stenosis: He has moderately severe to severe aortic valve stenosis. NYHA class 2-3. He is describing dyspnea with minimal exertion with associated fatigue. I have personally reviewed the echo images and his valve leaflets are thickened. His measurements are consistent with severe low flow/low gradient on the surface echo. On the TEE, the valve leaflets are restricted but appear to be moderately stenosed. Unless he has a change in his coronary disease, there is no other way to explain his recent symptoms other than his AS. I think he would benefit from AVR. He would be a candidate for surgical AVR or TAVR. His mitral stenosis appears mild by recent TEE performed by Dr. Sallyanne Kuster.    I have reviewed the natural history of aortic stenosis with the patient and their family members  who are present today. We have discussed the limitations of medical therapy and the poor prognosis associated with symptomatic aortic stenosis. We have reviewed potential treatment options, including palliative medical therapy, conventional surgical aortic valve replacement, and transcatheter aortic valve replacement. We discussed treatment options in the context of the patient's specific comorbid medical conditions.    He would like to proceed with planning for TAVR. After the cath, he will have a cardiac CT, CTA of the chest/abdomen and pelvis and will then be referred to see one of the CT surgeons on our TAVR team.    Again,his AS appears to be moderate to severe by TEE. His MS appears mild. No other way to explain his symptoms. Will use the data from the cardiac cath and CT scans to continue planning for the  best treatment option.      Lauree Chandler, MD, [facility] 03/27/2022 1:42 PM

## 2022-03-27 NOTE — Progress Notes (Signed)
Pt put on call light stating he feels like he is going to pass out. VSS, equal grips, symmetrical smile, bp 171/75 p80 spo2 100 on 2 L [place] for comfort, pt was paste and diaphoretic.  Denies pain of any kind. CBG rechecked and was 319. Spoke with Dr Julianne Handler and he will come and see the pt.

## 2022-03-28 ENCOUNTER — Encounter (HOSPITAL_COMMUNITY): Payer: Self-pay | Admitting: Cardiovascular Disease

## 2022-03-28 DIAGNOSIS — I440 Atrioventricular block, first degree: Secondary | ICD-10-CM | POA: Diagnosis not present

## 2022-03-28 DIAGNOSIS — I4892 Unspecified atrial flutter: Secondary | ICD-10-CM | POA: Diagnosis not present

## 2022-03-28 DIAGNOSIS — E119 Type 2 diabetes mellitus without complications: Secondary | ICD-10-CM | POA: Diagnosis not present

## 2022-03-28 DIAGNOSIS — Z794 Long term (current) use of insulin: Secondary | ICD-10-CM | POA: Diagnosis not present

## 2022-03-28 DIAGNOSIS — I350 Nonrheumatic aortic (valve) stenosis: Secondary | ICD-10-CM | POA: Diagnosis not present

## 2022-03-28 DIAGNOSIS — Z951 Presence of aortocoronary bypass graft: Secondary | ICD-10-CM | POA: Diagnosis not present

## 2022-03-28 DIAGNOSIS — I4510 Unspecified right bundle-branch block: Secondary | ICD-10-CM | POA: Diagnosis not present

## 2022-03-28 DIAGNOSIS — R42 Dizziness and giddiness: Secondary | ICD-10-CM | POA: Diagnosis not present

## 2022-03-28 DIAGNOSIS — I083 Combined rheumatic disorders of mitral, aortic and tricuspid valves: Secondary | ICD-10-CM | POA: Diagnosis not present

## 2022-03-28 DIAGNOSIS — I2510 Atherosclerotic heart disease of native coronary artery without angina pectoris: Secondary | ICD-10-CM | POA: Diagnosis not present

## 2022-03-28 DIAGNOSIS — E785 Hyperlipidemia, unspecified: Secondary | ICD-10-CM | POA: Diagnosis not present

## 2022-03-28 LAB — BASIC METABOLIC PANEL
Anion gap: 8 (ref 5–15)
BUN: 17 mg/dL (ref 8–23)
CO2: 26 mmol/L (ref 22–32)
Calcium: 9 mg/dL (ref 8.9–10.3)
Chloride: 104 mmol/L (ref 98–111)
Creatinine, Ser: 1.26 mg/dL — ABNORMAL HIGH (ref 0.61–1.24)
GFR, Estimated: >60 mL/min (ref >60)
Glucose, Bld: 75 mg/dL (ref 70–99)
Potassium: 4.2 mmol/L (ref 3.5–5.1)
Sodium: 138 mmol/L (ref 135–145)

## 2022-03-28 LAB — GLUCOSE, CAPILLARY
Glucose-Capillary: 178 mg/dL — ABNORMAL HIGH (ref 70–99)
Glucose-Capillary: 45 mg/dL — ABNORMAL LOW (ref 70–99)
Glucose-Capillary: 55 mg/dL — ABNORMAL LOW (ref 70–99)
Glucose-Capillary: 68 mg/dL — ABNORMAL LOW (ref 70–99)
Glucose-Capillary: 74 mg/dL (ref 70–99)

## 2022-03-28 NOTE — Progress Notes (Addendum)
Rounding Note    Patient Name: Matthew [name] Date of Encounter: 03/28/2022  [place] Cardiologist: Will Meredith Leeds, MD Dr. Darlina Guys  Subjective   Doing well this morning resting comfortably.  Uneventful night.  Feels much better.  Thought he may have had a panic attack.  Inpatient Medications    Scheduled Meds:  enoxaparin (LOVENOX) injection  40 mg Subcutaneous Q24H   insulin aspart  18 Units Subcutaneous TID WC   insulin NPH Human  20 Units Subcutaneous QHS   lisinopril  5 mg Oral Daily   metoprolol succinate  25 mg Oral BID   sodium chloride flush  3 mL Intravenous Q12H   Continuous Infusions:  sodium chloride     PRN Meds: sodium chloride, acetaminophen, ondansetron (ZOFRAN) IV, sodium chloride flush   Vital Signs    Vitals:   03/28/22 0104 03/28/22 0400 03/28/22 0434 03/28/22 0750  BP: (!) 120/56  (!) 119/58 (!) 137/57  Pulse: 68 81 78 76  Resp: '18  16 16  '$ Temp: 97.9 F (36.6 C)  98 F (36.7 C) 98.1 F (36.7 C)  TempSrc: Oral  Oral Oral  SpO2: 98% 96% 95% 99%  Weight:   89.7 kg   Height:       No intake or output data in the 24 hours ending 03/28/22 0844    03/28/2022    4:34 AM 03/27/2022   10:26 AM 03/23/2022   11:25 AM  Last 3 Weights  Weight (lbs) 197 lb 12 oz 201 lb 201 lb 9.6 oz  Weight (kg) 89.7 kg 91.173 kg 91.445 kg      Telemetry    Sinus rhythm first-degree AV block right bundle branch block as seen on ECG, no adverse arrhythmias no pauses no VT.- Personally Reviewed  ECG    No new, see below for details- Personally Reviewed  Physical Exam   GEN: No acute distress.   Neck: No JVD Cardiac: RRR, no murmurs, rubs, or gallops.  Respiratory: Clear to auscultation bilaterally. GI: Soft, nontender, non-distended  MS: No edema; No deformity. Neuro:  Nonfocal  Psych: Normal affect   Labs    High Sensitivity Troponin:  No results for input(s): "TROPONINIHS" in the last 720 hours.   Chemistry Recent  Labs  Lab 03/23/22 1256 03/27/22 1033 03/28/22 0132  NA 140 137 138  K 5.5* 5.4* 4.2  CL 102 104 104  CO2 '23 23 26  '$ GLUCOSE 256* 335* 75  BUN '25 16 17  '$ CREATININE 1.20 1.19 1.26*  CALCIUM 9.5 9.2 9.0  GFRNONAA  --  >60 >60  ANIONGAP  --  10 8    Lipids No results for input(s): "CHOL", "TRIG", "HDL", "LABVLDL", "LDLCALC", "CHOLHDL" in the last 168 hours.  Hematology Recent Labs  Lab 03/23/22 1256 03/27/22 1340  WBC 8.3 8.5  RBC 4.25 4.13*  HGB 12.5* 12.1*  HCT 37.3* 36.8*  MCV 88 89.1  MCH 29.4 29.3  MCHC 33.5 32.9  RDW 13.1 13.3  PLT 254 213   Thyroid No results for input(s): "TSH", "FREET4" in the last 168 hours.  BNPNo results for input(s): "BNP", "PROBNP" in the last 168 hours.  DDimer No results for input(s): "DDIMER" in the last 168 hours.   Radiology    No results found.  Cardiac Studies   Echocardiogram 11/10/2021: EF 55 to 60% with mitral stenosis and severe aortic stenosis with aortic valve area of 0.8 cm dimensionless index of 0.19 mean gradient of 26 mmHg.  Transesophageal echocardiogram 03/13/2022 Dr. Marshell Garfinkel mitral stenosis.  Moderate to severe aortic stenosis.  Patient Profile     76 y.o. male status post three-vessel CABG 2011 LIMA to LAD vein graft to OM vein graft to PDA with paroxysmal atrial flutter status post ablation x3 by Dr. Curt Bears on Eliquis, diabetes who is currently in the process for work-up for TAVR secondary to severe aortic stenosis.  On 12/1 came in for cardiac catheterization but felt poorly.  He was weak sweaty dizzy blood sugar was 300 admitted for observation and catheterization was canceled.  Assessment & Plan    Severe aortic stenosis - Moderately to severe aortic stenosis by echocardiogram with NYHA class II-[name] symptoms.  Dyspnea with minimal exertion with associated fatigue.  Measurements are consistent with severe low-flow low gradient aortic stenosis on surface echo.  It was felt that there was no other way to  explain his change in symptoms other than his aortic stenosis.  Dr. Angelena Form believed he would benefit from aortic valve replacement.    Mitral stenosis - Mild on transesophageal echocardiogram by Dr. Lorenza Cambridge.  Atrial flutter - Multiple ablations x3 by Dr. Curt Bears.  No longer on Eliquis.  Dizziness - Unclear etiology from yesterday.  He states this morning that he thinks that it may have been a panic attack/anxiety.  After his bypass surgery he had pretty severe PTSD he states.  This is certainly plausible as hyperventilation and anxiety can lead to a sensation of dizziness or even syncope.  Felt as though if he improved that he could be discharged home and plan cardiac catheterization later as an outpatient or Dr. Angelena Form.  Creatinine was slightly elevated at 1.26.  Perhaps subtle dehydration contributing to symptoms.  Diabetes - Glucose stable this morning at 75.  Was 300 prior to cath.  Right bundle branch block/first-degree AV block - Heart rate in the 70s EKG as described above.  He is ready for discharge.  Eager to go home.  His wife will come and pick him up.  We will go ahead and get paperwork started.  Okay to resume home medications.  For questions or updates, please contact [place] Please consult [email or web address] for contact info under        Signed, Candee Furbish, MD  03/28/2022, 8:44 AM

## 2022-03-28 NOTE — Progress Notes (Signed)
Hypoglycemic Event  CBG: 55  Treatment: 8 oz juice/soda  Symptoms: None  Follow-up CBG: Time: 0032 CBG Result:45  Possible Reasons for Event: Medication regimen: Insulin (sliding scale and bedtime)   Comments/MD notified: Wilmon Pali, MD- Verbal to repeat CBG in 30 mins after eating and then recheck a second time 30 mins after that.     Angelica Pou

## 2022-03-28 NOTE — Discharge Summary (Addendum)
Discharge Summary    Patient ID: Matthew [name] MRN: [id number]; DOB: May 05, 1945  Admit date: 03/27/2022 Discharge date: 03/28/2022  PCP:  Wendie Agreste, MD   [facility] Cardiologist:  Will Meredith Leeds, MD  Electrophysiologist:  Will Meredith Leeds, MD  {   Discharge Diagnoses    Principal Problem:   Aortic stenosis Active Problems:   Diabetes mellitus, type 2 (HCC)   Hyperlipidemia    Diagnostic Studies/Procedures    N/a  _____________   History of Present Illness     Matthew [name] is a 76 y.o. male with history of CAD s/p 3V CABG, hyperlipidemia, atrial flutter, mitral regurgitation, diabetes mellitus and aortic stenosis who presented to the office 11/27 for TAVR evaluation and set up for outpatient cardiac cath. He presented and was admitted from Short Stay with chills, diaphoresis. He was coming for a cardiac cath but before he was moved into the cath lab he began to feel poorly. He described feeling weak, sweaty and dizzy. BP and HR were stable. Blood sugar 300. Sinus on tele. EKG with no ischemia.   He is in the process of workup for TAVR. He is followed in our office by by Dr. Lennie Odor. He has undergone atrial flutter ablation x 3. He has CAD and has undergone 3V CABG in 2011. 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 He has had more dizziness and fatigue lately leading to an echo on 11/10/21 with LVEF=55-60%. Mitral stenosis. Moderately severe aortic stenosis with mean gradient of 26 mmHg, DI 0.19, SVI 30. AVA 0.8 cm2. TEE 03/13/22 per Dr. Sallyanne Kuster with mild mitral stenosis. Moderate to severe aortic stenosis.    His cardiac cath was canceled and admitted overnight for observation.    Hospital Course     Severe aortic stenosis - Moderately to severe aortic stenosis by echocardiogram with NYHA class II-[name] symptoms.  Dyspnea with minimal exertion with associated fatigue.  Measurements  are consistent with severe low-flow low gradient aortic stenosis on surface echo.  -- It was felt that there was no other way to explain his change in symptoms other than his aortic stenosis.   -- Dr. Angelena Form believed he would benefit from aortic valve replacement, currently undergoing TAVR work up   Mitral stenosis - Mild on transesophageal echocardiogram by Dr. Lorenza Cambridge.   Atrial flutter - Multiple ablations x3 by Dr. Curt Bears.   -- appears his Eliquis was stopped at recent office visit given no recurrence and patient desire to stop   Dizziness - Unclear etiology. He did report the following morning that he thought that it may have been a panic attack/anxiety.  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 -- certainly plausible as hyperventilation and anxiety can lead to a sensation of dizziness or even syncope.   -- symptoms resolved, eager for DC the following morning. Will need cardiac cath rescheduled. Message sent to staff. -- Creatinine was slightly elevated at 1.26.  Perhaps subtle dehydration contributing to symptoms.   Diabetes - Glucose stable at 75 the morning of DC -- resume home medications   Right bundle branch block/first-degree AV block - stable rates, in the 70s  Did the patient have an acute coronary syndrome (MI, NSTEMI, STEMI, etc) this admission?:  No  Did the patient have a percutaneous coronary intervention (stent / angioplasty)?:  No.    Patient was seen by Dr. Marlou Porch and deemed stable for discharge home. Follow up cardiac cath to be arranged.  _____________  Discharge Vitals Blood pressure (!) 148/60, pulse 76, temperature 98.1 F (36.7 C), temperature source Oral, resp. rate 16, height '5\' 6"'$  (1.676 m), weight 89.7 kg, SpO2 98 %.  Filed Weights   03/27/22 1026 03/28/22 0434  Weight: 91.2 kg 89.7 kg    Labs & Radiologic Studies    CBC Recent Labs    03/27/22 1340  WBC 8.5  HGB 12.1*  HCT 36.8*  MCV 89.1  PLT  258   Basic Metabolic Panel Recent Labs    03/27/22 1033 03/28/22 0132  NA 137 138  K 5.4* 4.2  CL 104 104  CO2 23 26  GLUCOSE 335* 75  BUN 16 17  CREATININE 1.19 1.26*  CALCIUM 9.2 9.0   Liver Function Tests No results for input(s): "AST", "ALT", "ALKPHOS", "BILITOT", "PROT", "ALBUMIN" in the last 72 hours. No results for input(s): "LIPASE", "AMYLASE" in the last 72 hours. High Sensitivity Troponin:   No results for input(s): "TROPONINIHS" in the last 720 hours.  BNP Invalid input(s): "POCBNP" D-Dimer No results for input(s): "DDIMER" in the last 72 hours. Hemoglobin A1C No results for input(s): "HGBA1C" in the last 72 hours. Fasting Lipid Panel No results for input(s): "CHOL", "HDL", "LDLCALC", "TRIG", "CHOLHDL", "LDLDIRECT" in the last 72 hours. Thyroid Function Tests No results for input(s): "TSH", "T4TOTAL", "T3FREE", "THYROIDAB" in the last 72 hours.  Invalid input(s): "FREET3" _____________  ECHO TEE  Result Date: 03/13/2022    TRANSESOPHOGEAL ECHO REPORT   Patient Name:   Matthew [name] Date of Exam: 03/13/2022 Medical Rec #:  [id number]            Height:       66.0 in Accession #:    [id number]           Weight:       200.8 lb Date of Birth:  [birth year]           BSA:          2.003 m Patient Age:    30 years             BP:           166/73 mmHg Patient Gender: M                    HR:           85 bpm. Exam Location:  Inpatient Procedure: Transesophageal Echo, Cardiac Doppler, Color Doppler and 3D Echo Indications:     Aortic stenosis                  Mitral valve stenosis  History:         Patient has prior history of Echocardiogram examinations, most                  recent 11/10/2021. Previous Myocardial Infarction and CAD,                  Arrythmias:Atrial Fibrillation and Atrial Flutter; Risk                  Factors:Diabetes and Dyslipidemia.  Sonographer:     Clayton Lefort RDCS (AE) Referring Phys:  [facility] Diagnosing Phys: Sanda Klein MD  PROCEDURE: After discussion of the risks  and benefits of a TEE, an informed consent was obtained from the patient. The transesophogeal probe was passed without difficulty through the esophogus of the patient. Local oropharyngeal anesthetic was provided with Cetacaine. Sedation performed by different physician. The patient was monitored while under deep sedation. Anesthestetic sedation was provided intravenously by Anesthesiology: 211.'4mg'$  of Propofol. Image quality was adequate. The patient developed no  complications during the procedure.  IMPRESSIONS  1. Left ventricular ejection fraction, by estimation, is 55 to 60%. The left ventricle has normal function. The left ventricle has no regional wall motion abnormalities. There is mild concentric left ventricular hypertrophy. Left ventricular diastolic parameters are consistent with Grade II diastolic dysfunction (pseudonormalization). Elevated left atrial pressure.  2. Right ventricular systolic function is normal. The right ventricular size is normal. There is normal pulmonary artery systolic pressure. The estimated right ventricular systolic pressure is 99.3 mmHg.  3. Left atrial size was moderately dilated. No left atrial/left atrial appendage thrombus was detected.  4. Right atrial size was moderately dilated.  5. The basal two-thirds of the anterior mitral leaflet are heavily calcified and almost motionless, whereas the posterior leaflet has preserved mobility. The mitral valve is degenerative. Mild to moderate mitral valve regurgitation. Mild mitral stenosis. The mean mitral valve gradient is 4.0 mmHg with average heart rate of 77 bpm. Moderate mitral annular calcification.  6. The aortic valve is tricuspid. There is severe calcifcation of the aortic valve. There is severe thickening of the aortic valve. Aortic valve regurgitation is trivial. Moderate to severe aortic valve stenosis.  7. There is Moderate (Grade [name]) protruding plaque involving the descending  aorta and aortic arch. Conclusion(s)/Recommendation(s): The degenerative changes of the mitral and aortic valves and in particular the appearance of the anterior mitral leaflet, is strongly suggestive of sequelae of previous radiation therapy. FINDINGS  Left Ventricle: Left ventricular ejection fraction, by estimation, is 55 to 60%. The left ventricle has normal function. The left ventricle has no regional wall motion abnormalities. The left ventricular internal cavity size was normal in size. There is  mild concentric left ventricular hypertrophy. Left ventricular diastolic parameters are consistent with Grade II diastolic dysfunction (pseudonormalization). Right Ventricle: The right ventricular size is normal. No increase in right ventricular wall thickness. Right ventricular systolic function is normal. There is normal pulmonary artery systolic pressure. The tricuspid regurgitant velocity is 2.47 m/s, and  with an assumed right atrial pressure of 3 mmHg, the estimated right ventricular systolic pressure is 71.6 mmHg. Left Atrium: Left atrial size was moderately dilated. No left atrial/left atrial appendage thrombus was detected. Right Atrium: Right atrial size was moderately dilated. Pericardium: There is no evidence of pericardial effusion. Mitral Valve: The basal two-thirds of the anterior mitral leaflet are heavily calcified and almost motionless, whereas the posterior leaflet has preserved mobility. The mitral valve is degenerative in appearance. There is severe thickening of the anterior mitral valve leaflet(s). There is severe calcification of the anterior mitral valve leaflet(s). Moderate mitral annular calcification. Mild to moderate mitral valve regurgitation, with centrally-directed jet. Mild mitral valve stenosis. Mitral valve area by planimetry is 3.08 cm, and by the pressure half-time method is calculated to be 4.14 cm. The mean transmitral gradient is 4.0 mmHg. MV peak gradient, 14.0 mmHg. The  mean mitral valve gradient is 4.0 mmHg with average heart rate of 77 bpm. Tricuspid Valve: The tricuspid valve is normal in structure. Tricuspid valve regurgitation is mild. Aortic Valve: Unable to align with the aortic ejection jet to measure accurate gradients.  By planimetry the aortic valve area is 1.0 cm sq. The aortic valve is tricuspid. There is severe calcifcation of the aortic valve. There is severe thickening of the  aortic valve. Aortic valve regurgitation is trivial. Moderate to severe aortic stenosis is present. Pulmonic Valve: The pulmonic valve was normal in structure. Pulmonic valve regurgitation is not visualized. Aorta: The aortic root, ascending aorta, aortic arch and descending aorta are all structurally normal, with no evidence of dilitation or obstruction. There is moderate (Grade [name]) protruding plaque involving the descending aorta and aortic arch. IAS/Shunts: No atrial level shunt detected by color flow Doppler.  LEFT VENTRICLE PLAX 2D LVOT diam:     2.21 cm LVOT Area:     3.85 cm  MITRAL VALVE               TRICUSPID VALVE MV Area (PHT):  4.14 cm   TR Peak grad:   24.4 mmHg MV Area (plan): 3.08 cm   TR Vmax:        247.00 cm/s MV Peak grad:   14.0 mmHg MV Mean grad:   4.0 mmHg   SHUNTS MV Vmax:        1.87 m/s   Systemic Diam: 2.21 cm MV Vmean:       93.7 cm/s MV Decel Time:  183 msec Mihai Croitoru MD Electronically signed by Sanda Klein MD Signature Date/Time: 03/13/2022/2:43:30 PM    Final    Disposition   Pt is being discharged home today in good condition.  Follow-up Plans & Appointments     Follow-up Information     Burnell Blanks, MD Follow up.   Specialty: Cardiology Why: The office will call you regarding rescheduling you heart cath Contact information: [place] [place] [address] [phone]                Discharge Instructions     Diet - low sodium heart healthy   Complete by: As directed    Increase activity  slowly   Complete by: As directed         Discharge Medications   Allergies as of 03/28/2022       Reactions   Actos [pioglitazone] Swelling   Bee Venom Swelling   Codeine Hives, Nausea Only   Statins Other (See Comments)   Muscle pain, fatigue   Glucophage [metformin Hcl] Nausea Only   Raspberry Itching        Medication List     TAKE these medications    aspirin EC 81 MG tablet Take 81 mg by mouth daily. Swallow whole.   BD Veo Insulin Syringe U/F 31G X 15/64" 0.5 ML Misc Generic drug: Insulin Syringe-Needle U-100 Use to inject 4 times a day   Dexcom G6 Receiver Devi Use to receive data from sensor   Dexcom G6 Sensor Misc Use to monitor blood sugar, change after 10 days   dorzolamide-timolol 2-0.5 % ophthalmic solution Commonly known as: COSOPT Place 1 drop into the left eye 2 (two) times daily.   furosemide 40 MG tablet Commonly known as: LASIX Take 1 tablet (40 mg total) by mouth daily as needed.   insulin aspart 100 UNIT/ML injection Commonly known as: NovoLOG Inject 18-20 Units into the skin 3 (three) times daily with meals.   insulin NPH Human 100 UNIT/ML injection Commonly known as: NOVOLIN N Inject 20-24 Units into the skin at bedtime.   latanoprost 0.005 % ophthalmic solution Commonly known as: XALATAN Place 1 drop  into the left eye at bedtime.   lisinopril 5 MG tablet Commonly known as: ZESTRIL TAKE 1 TABLET BY MOUTH EVERY DAY   Metoprolol Succinate 50 MG Cs24 Take 1/2 tablet by mouth in the morning and 1/2 tablet at bedtime (Total 50 mg )   OneTouch Verio test strip Generic drug: glucose blood USE AS DIRECTED AS NEEDED   potassium chloride 10 MEQ tablet Commonly known as: Klor-Con 10 Take 1 tablet (10 mEq total) by mouth as needed. With Lasix         Outstanding Labs/Studies   Will need outpatient cardiac cath rescheduled (message sent to staff)  Duration of Discharge Encounter   Greater than 30 minutes including  physician time.  Signed, Reino Bellis, NP 03/28/2022, 9:26 AM   Personally seen and examined. Agree with above.  Doing well this morning resting comfortably.  Uneventful night.  Feels much better.  Thought he may have had a panic attack.   Inpatient Medications    Scheduled Meds:  enoxaparin (LOVENOX) injection  40 mg Subcutaneous Q24H   insulin aspart  18 Units Subcutaneous TID WC   insulin NPH Human  20 Units Subcutaneous QHS   lisinopril  5 mg Oral Daily   metoprolol succinate  25 mg Oral BID   sodium chloride flush  3 mL Intravenous Q12H    Continuous Infusions:  sodium chloride      PRN Meds: sodium chloride, acetaminophen, ondansetron (ZOFRAN) IV, sodium chloride flush    Vital Signs          Vitals:    03/28/22 0104 03/28/22 0400 03/28/22 0434 03/28/22 0750  BP: (!) 120/56   (!) 119/58 (!) 137/57  Pulse: 68 81 78 76  Resp: '18   16 16  '$ Temp: 97.9 F (36.6 C)   98 F (36.7 C) 98.1 F (36.7 C)  TempSrc: Oral   Oral Oral  SpO2: 98% 96% 95% 99%  Weight:     89.7 kg    Height:            No intake or output data in the 24 hours ending 03/28/22 0844     03/28/2022    4:34 AM 03/27/2022   10:26 AM 03/23/2022   11:25 AM  Last 3 Weights  Weight (lbs) 197 lb 12 oz 201 lb 201 lb 9.6 oz  Weight (kg) 89.7 kg 91.173 kg 91.445 kg       Telemetry    Sinus rhythm first-degree AV block right bundle branch block as seen on ECG, no adverse arrhythmias no pauses no VT.- Personally Reviewed   ECG    No new, see below for details- Personally Reviewed   Physical Exam    GEN: No acute distress.   Neck: No JVD Cardiac: RRR, no murmurs, rubs, or gallops.  Respiratory: Clear to auscultation bilaterally. GI: Soft, nontender, non-distended  MS: No edema; No deformity. Neuro:  Nonfocal  Psych: Normal affect    Labs    High Sensitivity Troponin:   Last Labs  No results for input(s): "TROPONINIHS" in the last 720 hours.     Chemistry Last Labs       Recent Labs   Lab 03/23/22 1256 03/27/22 1033 03/28/22 0132  NA 140 137 138  K 5.5* 5.4* 4.2  CL 102 104 104  CO2 '23 23 26  '$ GLUCOSE 256* 335* 75  BUN '25 16 17  '$ CREATININE 1.20 1.19 1.26*  CALCIUM 9.5 9.2 9.0  GFRNONAA  --  >60 >60  ANIONGAP  --  10 8      Lipids  Last Labs  No results for input(s): "CHOL", "TRIG", "HDL", "LABVLDL", "LDLCALC", "CHOLHDL" in the last 168 hours.    Hematology Last Labs      Recent Labs  Lab 03/23/22 1256 03/27/22 1340  WBC 8.3 8.5  RBC 4.25 4.13*  HGB 12.5* 12.1*  HCT 37.3* 36.8*  MCV 88 89.1  MCH 29.4 29.3  MCHC 33.5 32.9  RDW 13.1 13.3  PLT 254 213      Thyroid  Last Labs  No results for input(s): "TSH", "FREET4" in the last 168 hours.    BNP Last Labs  No results for input(s): "BNP", "PROBNP" in the last 168 hours.    DDimer  Last Labs  No results for input(s): "DDIMER" in the last 168 hours.      Radiology    Imaging Results (Last 48 hours)  No results found.     Cardiac Studies    Echocardiogram 11/10/2021: EF 55 to 60% with mitral stenosis and severe aortic stenosis with aortic valve area of 0.8 cm dimensionless index of 0.19 mean gradient of 26 mmHg.   Transesophageal echocardiogram 03/13/2022 Dr. Marshell Garfinkel mitral stenosis.  Moderate to severe aortic stenosis.   Patient Profile     76 y.o. male status post three-vessel CABG 2011 LIMA to LAD vein graft to OM vein graft to PDA with paroxysmal atrial flutter status post ablation x3 by Dr. Curt Bears on Eliquis, diabetes who is currently in the process for work-up for TAVR secondary to severe aortic stenosis.  On 12/1 came in for cardiac catheterization but felt poorly.  He was weak sweaty dizzy blood sugar was 300 admitted for observation and catheterization was canceled.   Assessment & Plan    Severe aortic stenosis - Moderately to severe aortic stenosis by echocardiogram with NYHA class II-[name] symptoms.  Dyspnea with minimal exertion with associated fatigue.  Measurements  are consistent with severe low-flow low gradient aortic stenosis on surface echo.  It was felt that there was no other way to explain his change in symptoms other than his aortic stenosis.  Dr. Angelena Form believed he would benefit from aortic valve replacement.     Mitral stenosis - Mild on transesophageal echocardiogram by Dr. Lorenza Cambridge.   Atrial flutter - Multiple ablations x3 by Dr. Curt Bears.  No longer on Eliquis.   Dizziness - Unclear etiology from yesterday.  He states this morning that he thinks that it may have been a panic attack/anxiety.  After his bypass surgery he had pretty severe PTSD he states.  This is certainly plausible as hyperventilation and anxiety can lead to a sensation of dizziness or even syncope.  Felt as though if he improved that he could be discharged home and plan cardiac catheterization later as an outpatient or Dr. Angelena Form.  Creatinine was slightly elevated at 1.26.  Perhaps subtle dehydration contributing to symptoms.   Diabetes - Glucose stable this morning at 75.  Was 300 prior to cath.   Right bundle branch block/first-degree AV block - Heart rate in the 70s EKG as described above.   He is ready for discharge.  Eager to go home.  His wife will come and pick him up.  We will go ahead and get paperwork started.  Okay to resume home medications.   For questions or updates, please contact [facility] Please consult [email or web address] for contact info under         Signed, Candee Furbish, MD

## 2022-04-01 ENCOUNTER — Telehealth: Payer: Self-pay

## 2022-04-01 NOTE — Telephone Encounter (Signed)
Chart notes sent over to [facility] that were requested.

## 2022-04-02 ENCOUNTER — Other Ambulatory Visit: Payer: Medicare HMO

## 2022-04-03 ENCOUNTER — Other Ambulatory Visit: Payer: Medicare HMO

## 2022-04-07 ENCOUNTER — Ambulatory Visit: Payer: Medicare HMO | Admitting: Endocrinology

## 2022-04-08 ENCOUNTER — Other Ambulatory Visit: Payer: Self-pay | Admitting: Endocrinology

## 2022-04-08 DIAGNOSIS — E1165 Type 2 diabetes mellitus with hyperglycemia: Secondary | ICD-10-CM

## 2022-04-09 ENCOUNTER — Telehealth: Payer: Self-pay | Admitting: *Deleted

## 2022-04-09 NOTE — Telephone Encounter (Addendum)
Cardiac Catheterization scheduled at [facility] for: Monday April 13, 2022 7:30 AM Arrival time and place: [place] Entrance A at: 5:30 AM  Nothing to eat after midnight prior to procedure, clear liquids until 5 AM day of procedure.  Medication instructions: -Hold: Insulin-AM of procedure/1/2 usual Insulin HS prior to procedure Lasix/KCl-AM of procedure-takes prn -Except hold medications usual morning medications can be taken with sips of water including aspirin 81 mg.  Confirmed patient has responsible adult to drive home post procedure and be with patient first 24 hours after arriving home.  Patient reports no new symptoms concerning for COVID-19 in the past 10 days.  Reviewed procedure instructions with patient.

## 2022-04-09 NOTE — Telephone Encounter (Signed)
Patient requests something for anxiety prior to cath 04/13/22.  Per Dr Allyne Gee 5 mg PO on arrival to Short Stay 04/13/22, pt aware.

## 2022-04-13 ENCOUNTER — Other Ambulatory Visit: Payer: Self-pay

## 2022-04-13 ENCOUNTER — Encounter (HOSPITAL_COMMUNITY)
Admission: RE | Disposition: A | Discharge status: Home / Self Care | Payer: Self-pay | Source: Home / Self Care | Attending: Cardiovascular Disease

## 2022-04-13 ENCOUNTER — Ambulatory Visit (HOSPITAL_COMMUNITY)
Admission: RE | Admit: 2022-04-13 | Discharge: 2022-04-13 | Disposition: A | Discharge status: Home / Self Care | Payer: Medicare HMO | Attending: Cardiovascular Disease | Admitting: Cardiovascular Disease

## 2022-04-13 ENCOUNTER — Encounter (HOSPITAL_COMMUNITY): Payer: Self-pay | Admitting: Cardiovascular Disease

## 2022-04-13 DIAGNOSIS — E11319 Type 2 diabetes mellitus with unspecified diabetic retinopathy without macular edema: Secondary | ICD-10-CM | POA: Insufficient documentation

## 2022-04-13 DIAGNOSIS — E785 Hyperlipidemia, unspecified: Secondary | ICD-10-CM | POA: Insufficient documentation

## 2022-04-13 DIAGNOSIS — I4892 Unspecified atrial flutter: Secondary | ICD-10-CM | POA: Diagnosis not present

## 2022-04-13 DIAGNOSIS — I25810 Atherosclerosis of coronary artery bypass graft(s) without angina pectoris: Secondary | ICD-10-CM | POA: Diagnosis not present

## 2022-04-13 DIAGNOSIS — Z833 Family history of diabetes mellitus: Secondary | ICD-10-CM | POA: Insufficient documentation

## 2022-04-13 DIAGNOSIS — Z794 Long term (current) use of insulin: Secondary | ICD-10-CM | POA: Diagnosis not present

## 2022-04-13 DIAGNOSIS — I350 Nonrheumatic aortic (valve) stenosis: Secondary | ICD-10-CM

## 2022-04-13 DIAGNOSIS — I2510 Atherosclerotic heart disease of native coronary artery without angina pectoris: Secondary | ICD-10-CM | POA: Diagnosis not present

## 2022-04-13 DIAGNOSIS — Z87891 Personal history of nicotine dependence: Secondary | ICD-10-CM | POA: Insufficient documentation

## 2022-04-13 DIAGNOSIS — Z951 Presence of aortocoronary bypass graft: Secondary | ICD-10-CM | POA: Diagnosis not present

## 2022-04-13 DIAGNOSIS — I2582 Chronic total occlusion of coronary artery: Secondary | ICD-10-CM | POA: Diagnosis not present

## 2022-04-13 DIAGNOSIS — Z8249 Family history of ischemic heart disease and other diseases of the circulatory system: Secondary | ICD-10-CM | POA: Diagnosis not present

## 2022-04-13 HISTORY — PX: RIGHT/LEFT HEART CATH AND CORONARY/GRAFT ANGIOGRAPHY: CATH118267

## 2022-04-13 LAB — GLUCOSE, CAPILLARY: Glucose-Capillary: 255 mg/dL — ABNORMAL HIGH (ref 70–99)

## 2022-04-13 SURGERY — RIGHT/LEFT HEART CATH AND CORONARY/GRAFT ANGIOGRAPHY
Anesthesia: LOCAL

## 2022-04-13 MED ORDER — HYDRALAZINE HCL 20 MG/ML IJ SOLN: 10.0000 mg | INTRAMUSCULAR | Status: DC | PRN

## 2022-04-13 MED ORDER — SODIUM CHLORIDE 0.9 % IV SOLN: 250.0000 mL | INTRAVENOUS | Status: DC | PRN

## 2022-04-13 MED ORDER — SODIUM CHLORIDE 0.9% FLUSH: 3.0000 mL | INTRAVENOUS | Status: DC | PRN

## 2022-04-13 MED ORDER — IOHEXOL 350 MG/ML SOLN
INTRAVENOUS | Status: DC | PRN
  Administered 2022-04-13: 85 mL

## 2022-04-13 MED ORDER — MIDAZOLAM HCL 2 MG/2ML IJ SOLN
INTRAMUSCULAR | Status: AC
  Filled 2022-04-13: qty 2

## 2022-04-13 MED ORDER — HEPARIN (PORCINE) IN NACL 1000-0.9 UT/500ML-% IV SOLN
INTRAVENOUS | Status: AC
  Filled 2022-04-13: qty 1000

## 2022-04-13 MED ORDER — VERAPAMIL HCL 2.5 MG/ML IV SOLN
INTRAVENOUS | Status: AC
  Filled 2022-04-13: qty 2

## 2022-04-13 MED ORDER — ONDANSETRON HCL 4 MG/2ML IJ SOLN: 4.0000 mg | Freq: Four times a day (QID) | INTRAMUSCULAR | Status: DC | PRN

## 2022-04-13 MED ORDER — MIDAZOLAM HCL 2 MG/2ML IJ SOLN
INTRAMUSCULAR | Status: DC | PRN
  Administered 2022-04-13: 2 mg via INTRAVENOUS

## 2022-04-13 MED ORDER — DIAZEPAM 5 MG PO TABS
5.0000 mg | ORAL_TABLET | Freq: Once | ORAL | Status: AC
  Administered 2022-04-13: 5 mg via ORAL
  Filled 2022-04-13: qty 1

## 2022-04-13 MED ORDER — ASPIRIN 81 MG PO CHEW
81.0000 mg | CHEWABLE_TABLET | ORAL | Status: AC
  Administered 2022-04-13: 81 mg via ORAL
  Filled 2022-04-13: qty 1

## 2022-04-13 MED ORDER — VERAPAMIL HCL 2.5 MG/ML IV SOLN
INTRAVENOUS | Status: DC | PRN
  Administered 2022-04-13: 10 mL via INTRA_ARTERIAL

## 2022-04-13 MED ORDER — SODIUM CHLORIDE 0.9 % WEIGHT BASED INFUSION
3.0000 mL/kg/h | INTRAVENOUS | Status: AC
  Administered 2022-04-13: 3 mL/kg/h via INTRAVENOUS

## 2022-04-13 MED ORDER — SODIUM CHLORIDE 0.9% FLUSH: 3.0000 mL | Freq: Two times a day (BID) | INTRAVENOUS | Status: DC

## 2022-04-13 MED ORDER — LIDOCAINE HCL (PF) 1 % IJ SOLN
INTRAMUSCULAR | Status: DC | PRN
  Administered 2022-04-13: 5 mL

## 2022-04-13 MED ORDER — ACETAMINOPHEN 325 MG PO TABS: 650.0000 mg | ORAL_TABLET | ORAL | Status: DC | PRN

## 2022-04-13 MED ORDER — SODIUM CHLORIDE 0.9 % WEIGHT BASED INFUSION: 1.0000 mL/kg/h | INTRAVENOUS | Status: DC

## 2022-04-13 MED ORDER — LIDOCAINE HCL (PF) 1 % IJ SOLN
INTRAMUSCULAR | Status: AC
  Filled 2022-04-13: qty 30

## 2022-04-13 MED ORDER — HEPARIN SODIUM (PORCINE) 1000 UNIT/ML IJ SOLN
INTRAMUSCULAR | Status: AC
  Filled 2022-04-13: qty 10

## 2022-04-13 MED ORDER — FENTANYL CITRATE (PF) 100 MCG/2ML IJ SOLN
INTRAMUSCULAR | Status: AC
  Filled 2022-04-13: qty 2

## 2022-04-13 MED ORDER — HEPARIN (PORCINE) IN NACL 1000-0.9 UT/500ML-% IV SOLN
INTRAVENOUS | Status: DC | PRN
  Administered 2022-04-13 (×2): 500 mL

## 2022-04-13 MED ORDER — LABETALOL HCL 5 MG/ML IV SOLN: 10.0000 mg | INTRAVENOUS | Status: DC | PRN

## 2022-04-13 MED ORDER — SODIUM CHLORIDE 0.9 % IV SOLN: INTRAVENOUS | Status: AC

## 2022-04-13 MED ORDER — HEPARIN SODIUM (PORCINE) 1000 UNIT/ML IJ SOLN
INTRAMUSCULAR | Status: DC | PRN
  Administered 2022-04-13: 4500 [IU] via INTRAVENOUS

## 2022-04-13 MED ORDER — FENTANYL CITRATE (PF) 100 MCG/2ML IJ SOLN
INTRAMUSCULAR | Status: DC | PRN
  Administered 2022-04-13: 50 ug via INTRAVENOUS

## 2022-04-13 SURGICAL SUPPLY — 14 items
CATH INFINITI 5 FR IM (CATHETERS) IMPLANT
CATH INFINITI 5FR MULTPACK ANG (CATHETERS) IMPLANT
CATH SWAN GANZ 7F STRAIGHT (CATHETERS) IMPLANT
DEVICE RAD COMP TR BAND LRG (VASCULAR PRODUCTS) IMPLANT
ELECT DEFIB PAD ADLT CADENCE (PAD) IMPLANT
GLIDESHEATH SLEND SS 6F .021 (SHEATH) IMPLANT
GLIDESHEATH SLENDER 7FR .021G (SHEATH) IMPLANT
GUIDEWIRE INQWIRE 1.5J.035X260 (WIRE) IMPLANT
INQWIRE 1.5J .035X260CM (WIRE) ×1
KIT HEART LEFT (KITS) ×1 IMPLANT
PACK CARDIAC CATHETERIZATION (CUSTOM PROCEDURE TRAY) ×1 IMPLANT
SHEATH PROBE COVER 6X72 (BAG) IMPLANT
TRANSDUCER W/STOPCOCK (MISCELLANEOUS) ×1 IMPLANT
TUBING CIL FLEX 10 FLL-RA (TUBING) ×1 IMPLANT

## 2022-04-13 NOTE — Progress Notes (Signed)
TR band removed and new dressing along w/ arm brace placed. No bleeding or hematoma noted. Will continue to monitor.

## 2022-04-13 NOTE — Interval H&P Note (Signed)
History and Physical Interval Note:  04/13/2022 7:16 AM  Matthew [name]  has presented today for surgery, with the diagnosis of aortic stenosis.  The various methods of treatment have been discussed with the patient and family. After consideration of risks, benefits and other options for treatment, the patient has consented to  Procedure(s): RIGHT/LEFT HEART CATH AND CORONARY/GRAFT ANGIOGRAPHY (N/A) as a surgical intervention.  The patient's history has been reviewed, patient examined, no change in status, stable for surgery.  I have reviewed the patient's chart and labs.  Questions were answered to the patient's satisfaction.    Cath Lab Visit (complete for each Cath Lab visit)  Clinical Evaluation Leading to the Procedure:   ACS: No.  Non-ACS:    Anginal Classification: CCS [name]  Anti-ischemic medical therapy: Minimal Therapy (1 class of medications)  Non-Invasive Test Results: No non-invasive testing performed  Prior CABG: Previous CABG        Matthew [name]

## 2022-04-13 NOTE — Progress Notes (Signed)
Pt and wife received d/c instructions both written and verbal. All questions and concerns addressed. New dressing and arm brace in place, no bleeding or discomfort noted. Pt remains in stable condition and denies any acute pain or discomfort. Will continue to monitor.

## 2022-04-14 LAB — POCT I-STAT EG7
Acid-Base Excess: 0 mmol/L (ref 0.0–2.0)
Bicarbonate: 26.7 mmol/L (ref 20.0–28.0)
Calcium, Ion: 1.12 mmol/L — ABNORMAL LOW (ref 1.15–1.40)
HCT: 35 % — ABNORMAL LOW (ref 39.0–52.0)
Hemoglobin: 11.9 g/dL — ABNORMAL LOW (ref 13.0–17.0)
O2 Saturation: 76 %
Potassium: 4.2 mmol/L (ref 3.5–5.1)
Sodium: 141 mmol/L (ref 135–145)
TCO2: 28 mmol/L (ref 22–32)
pCO2, Ven: 51.3 mmHg (ref 44–60)
pH, Ven: 7.324 (ref 7.25–7.43)
pO2, Ven: 44 mmHg (ref 32–45)

## 2022-04-14 LAB — POCT I-STAT 7, (LYTES, BLD GAS, ICA,H+H)
Acid-base deficit: 3 mmol/L — ABNORMAL HIGH (ref 0.0–2.0)
Bicarbonate: 21.8 mmol/L (ref 20.0–28.0)
Calcium, Ion: 0.92 mmol/L — ABNORMAL LOW (ref 1.15–1.40)
HCT: 31 % — ABNORMAL LOW (ref 39.0–52.0)
Hemoglobin: 10.5 g/dL — ABNORMAL LOW (ref 13.0–17.0)
O2 Saturation: 99 %
Potassium: 3.8 mmol/L (ref 3.5–5.1)
Sodium: 145 mmol/L (ref 135–145)
TCO2: 23 mmol/L (ref 22–32)
pCO2 arterial: 37.4 mmHg (ref 32–48)
pH, Arterial: 7.374 (ref 7.35–7.45)
pO2, Arterial: 165 mmHg — ABNORMAL HIGH (ref 83–108)

## 2022-04-16 DIAGNOSIS — E1165 Type 2 diabetes mellitus with hyperglycemia: Secondary | ICD-10-CM | POA: Diagnosis not present

## 2022-04-21 ENCOUNTER — Ambulatory Visit (HOSPITAL_COMMUNITY)
Admission: RE | Admit: 2022-04-21 | Discharge: 2022-04-21 | Disposition: A | Discharge status: Home / Self Care | Payer: Medicare HMO | Source: Ambulatory Visit | Attending: Cardiovascular Disease | Admitting: Cardiovascular Disease

## 2022-04-21 DIAGNOSIS — I350 Nonrheumatic aortic (valve) stenosis: Secondary | ICD-10-CM

## 2022-04-21 MED ORDER — METOPROLOL TARTRATE 5 MG/5ML IV SOLN: 10.0000 mg | Freq: Once | INTRAVENOUS | Status: AC

## 2022-04-21 MED ORDER — IOHEXOL 350 MG/ML SOLN
100.0000 mL | Freq: Once | INTRAVENOUS | Status: AC | PRN
  Administered 2022-04-21: 100 mL via INTRAVENOUS

## 2022-04-21 MED ORDER — METOPROLOL TARTRATE 5 MG/5ML IV SOLN
INTRAVENOUS | Status: AC
  Administered 2022-04-21: 10 mg via INTRAVENOUS
  Filled 2022-04-21: qty 10

## 2022-05-04 ENCOUNTER — Other Ambulatory Visit: Payer: Self-pay | Admitting: Endocrinology

## 2022-05-06 ENCOUNTER — Encounter: Payer: Medicare HMO | Admitting: Surgery

## 2022-05-07 ENCOUNTER — Ambulatory Visit: Payer: Medicare HMO | Admitting: Family Medicine

## 2022-05-07 ENCOUNTER — Other Ambulatory Visit (HOSPITAL_COMMUNITY): Payer: Self-pay

## 2022-05-08 ENCOUNTER — Telehealth: Payer: Self-pay

## 2022-05-08 NOTE — Telephone Encounter (Signed)
Called and LM requesting the patient call back to confirm if he would like this referral to be placed   Staff message from Dr Carlota Raspberry requesting call to the patient:  "Please contact patient.  Based on his medical history and current meds may be beneficial to also be followed by pharmacist.  Let me know if that is okay with him and I can refer to our pharmacist for assessment. JG "  This follows Christians message about the patients care : "Apologize if I had sent you this before.  This patient was identified as a higher risk patient that we are accountable for and may benefit from enrollment with PharmD team.  If you agree would you mind sending the new referral ([id number], select pharmacist) so we can begin seeing him."

## 2022-05-11 NOTE — Telephone Encounter (Signed)
Noted. Thanks.

## 2022-05-11 NOTE — Telephone Encounter (Signed)
Pt denied doesn't want another Dr

## 2022-05-16 DIAGNOSIS — E1165 Type 2 diabetes mellitus with hyperglycemia: Secondary | ICD-10-CM | POA: Diagnosis not present

## 2022-05-18 ENCOUNTER — Encounter: Payer: Medicare HMO | Admitting: Surgery

## 2022-05-20 ENCOUNTER — Encounter: Payer: Self-pay | Admitting: Surgery

## 2022-05-20 ENCOUNTER — Institutional Professional Consult (permissible substitution): Payer: Medicare HMO | Admitting: Surgery

## 2022-05-20 VITALS — BP 148/69 | HR 94 | Resp 18 | Ht 66.0 in | Wt 196.0 lb

## 2022-05-20 DIAGNOSIS — I350 Nonrheumatic aortic (valve) stenosis: Secondary | ICD-10-CM | POA: Diagnosis not present

## 2022-05-20 NOTE — Progress Notes (Signed)
Patient ID: Matthew [name], male   DOB: June 23, 1945, 77 y.o.   MRN: [id number]  [facility]       [address]       [place] [address]             [phone]          CARDIOTHORACIC SURGERY CONSULTATION REPORT  PCP is Wendie Agreste, MD Referring Provider is Lauree Chandler, MD Primary Cardiologist is Will Meredith Leeds, MD  Reason for consultation: Severe aortic stenosis  HPI:  The patient is a 77 year old gentleman with a history of diabetes, hyperlipidemia, testicular cancer status post surgery and radiation to his chest, atrial fibrillation/flutter status post ablation x 3, coronary artery disease status post CABG in 2011 and aortic stenosis who was referred for consideration of TAVR.  He had a TEE on 03/13/2022 showing trileaflet aortic valve with severe calcification and thickening of the leaflets.  By planimetry the aortic valve area was 1 cm.  Left ventricular ejection fraction was 55 to 60% with mild concentric LVH and grade 2 diastolic dysfunction.  The anterior leaflet of the mitral valve was heavily calcified and motionless with preserved posterior leaflet motility.  There was mild to moderate mitral valve regurgitation and mild mitral stenosis with a mean gradient of 4 mmHg.  He presented for catheterization on 03/27/2022 but was feeling poorly with chills and diaphoresis as well as hyperglycemia and his catheterization was canceled at that time.  He was admitted for further workup.  Cardiac catheterization was performed on 04/13/2022 showing severe three-vessel coronary disease with 3/3 patent bypass grafts.  The peak to peak gradient across the aortic valve was 18.8 mmHg with a mean gradient of 16.4 mmHg.  The patient is here today with his wife.  He has noted progressive exertional shortness of breath and fatigue over the past several months and said that he felt like he might not make it a  few times.  He has had episodes of dizziness particularly when bending over or standing up.  He has had lower extremity edema which resolves overnight.  He has had occasional episodes of chest discomfort recently.  Past Medical History:  Diagnosis Date   Anemia    Arthritis    "back, left ankle" (11/20/2015)   Cataract    CHF (congestive heart failure) (HCC)    Chronic lower back pain    Coronary artery disease    a. 05/2009 CABG x 3: LIMA->LAD, VG->OM, VG->PDA; b. Nuc 01/2014: inf-lat scar but no ischemia, EF 57%.   Diabetic retinopathy (HCC)    mild- Dr. Herbert Deaner   Glaucoma    Hyperlipidemia    a. Intolerant of lipitor and vytorin.   Lyme disease    Migraine    "once or twice" (11/20/2015)   Myocardial infarction [facility])    'saw evidence of it on an EKG done in 2005"   Paroxysmal atrial flutter ([place])    a. 01/2014 s/p DCCV;  b. CHA2DS2VASc = 3-->Eliquis.   Pneumonia 1960   Sacral pain    "right"   Sciatica of right side    Testicular cancer ([place]) 1982   "heavy doses of radiation; it was stage 2"   Type II diabetes mellitus ([place])    Valvular heart disease    a. 01/2014 Echo: Ef 60-65%, no rwma, mild AI/MS, mod MR, mildly dil LA.    Past Surgical History:  Procedure Laterality Date  ABDOMINAL EXPLORATION SURGERY  ~ 2005   "for hernia, but didn't have one"   ATRIAL FIBRILLATION ABLATION N/A 09/17/2016   Procedure: Atrial Fibrillation Ablation;  Surgeon: [name], MD;  Location: [facility];  Service: Cardiovascular;  Laterality: N/A;   CARDIAC CATHETERIZATION  2011   CARDIOVERSION N/A 02/19/2014   Procedure: CARDIOVERSION;  Surgeon: [name], MD;  Location: [place];  Service: Cardiovascular;  Laterality: N/A;   CARDIOVERSION N/A 10/28/2015   Procedure: CARDIOVERSION;  Surgeon: [name], MD;  Location: [place];  Service: Cardiovascular;  Laterality: N/A;   CARDIOVERSION  11/20/2015   "200 joules"   CORONARY ARTERY BYPASS GRAFT  05/2009    LIMA-LAD, SVG-OM, SVG-PDA 06/20/09   ELBOW FRACTURE SURGERY Left    broken ulna on left elbow-surgical repair   ELECTROPHYSIOLOGIC STUDY N/A 10/31/2014   Procedure: A-Flutter;  Surgeon: [name], MD;  Location: [place] [facility];  Service: Cardiovascular;  Laterality: N/A;   ELECTROPHYSIOLOGIC STUDY N/A 11/20/2015   Procedure: A-Flutter Ablation;  Surgeon: [name], MD;  Location: [place];  Service: Cardiovascular;  Laterality: N/A;   EYE SURGERY     FRACTURE SURGERY     RIGHT/LEFT HEART CATH AND CORONARY/GRAFT ANGIOGRAPHY N/A 04/13/2022   Procedure: RIGHT/LEFT HEART CATH AND CORONARY/GRAFT ANGIOGRAPHY;  Surgeon: Burnell Blanks, MD;  Location: [facility];  Service: Cardiovascular;  Laterality: N/A;   TEE WITHOUT CARDIOVERSION N/A 03/13/2022   Procedure: TRANSESOPHAGEAL ECHOCARDIOGRAM (TEE);  Surgeon: Sanda Klein, MD;  Location: [facility];  Service: Cardiovascular;  Laterality: N/A;   TESTICLE REMOVAL Right 1982   TONSILLECTOMY  ~ 1967    Family History  Problem Relation Age of Onset   Diabetes Mother    Heart disease Mother        Heart murmur   Heart disease Father    Heart attack Father    Diabetes Father    Hypertension Father    Diabetes Sister     Social History   Socioeconomic History   Marital status: Married    Spouse name: Not on file   Number of children: 0   Years of education: Not on file   Highest education level: Not on file  Occupational History   Occupation: Photo journalist  Tobacco Use   Smoking status: Former    Packs/day: 0.50    Years: 15.00    Total pack years: 7.50    Types: Cigarettes    Quit date: 1990    Years since quitting: 34.0   Smokeless tobacco: Never   Tobacco comments:    "quit smoking cigarettes in the 1980's; don't know how much or for how long"  Vaping Use   Vaping Use: Never used  Substance and Sexual Activity   Alcohol use: Yes    Alcohol/week: 3.0 standard drinks of alcohol     Types: 3 Cans of beer per week   Drug use: No    Comment: "quit smoking pot in the 1990s"   Sexual activity: Not Currently    Birth control/protection: Coitus interruptus  Other Topics Concern   Not on file  Social History Narrative   Not on file   Social Determinants of Health   Financial Resource Strain: Not on file  Food Insecurity: No Food Insecurity (03/28/2022)   Hunger Vital Sign    Worried About Running Out of Food in the Last Year: Never true    Ran Out of Food in the  Last Year: Never true  Transportation Needs: No Transportation Needs (03/28/2022)   PRAPARE - Hydrologist (Medical): No    Lack of Transportation (Non-Medical): No  Physical Activity: Not on file  Stress: Not on file  Social Connections: Not on file  Intimate Partner Violence: Not At Risk (03/28/2022)   Humiliation, Afraid, Rape, and Kick questionnaire    Fear of Current or Ex-Partner: No    Emotionally Abused: No    Physically Abused: No    Sexually Abused: No    Prior to Admission medications   Medication Sig Start Date End Date Taking? Authorizing Provider  aspirin EC 81 MG tablet Take 81 mg by mouth in the morning. Swallow whole.   Yes [provider]  Aspirin-Caffeine (BAYER BACK & BODY PAIN EX ST PO) Take 1-2 tablets by mouth 2 (two) times daily as needed (pain.).   Yes [provider]  Continuous Blood Gluc Receiver (DEXCOM G6 RECEIVER) DEVI Use to receive data from sensor 05/23/21  Yes Elayne Snare, MD  Continuous Blood Gluc Sensor (DEXCOM G6 SENSOR) MISC Use to monitor blood sugar, change after 10 days 05/23/21  Yes Elayne Snare, MD  dorzolamide-timolol (COSOPT) 22.3-6.8 MG/ML ophthalmic solution Place 1 drop into the left eye 2 (two) times daily. 10/01/21  Yes [provider]  furosemide (LASIX) 40 MG tablet Take 1 tablet (40 mg total) by mouth daily as needed. 08/14/21  Yes Camnitz, Will Hassell Done, MD  insulin aspart (NOVOLOG) 100 UNIT/ML injection  Inject 18-20 Units into the skin 3 (three) times daily with meals. Patient taking differently: Inject 15-20 Units into the skin 3 (three) times daily with meals. Sliding scale 03/16/22  Yes Elayne Snare, MD  Insulin Syringe-Needle U-100 (BD VEO INSULIN SYRINGE U/F) 31G X 15/64" 0.5 ML MISC Use to inject 4 times a day 08/11/21  Yes Elayne Snare, MD  latanoprost (XALATAN) 0.005 % ophthalmic solution Place 1 drop into the left eye at bedtime. 04/01/21  Yes [provider]  lisinopril (ZESTRIL) 5 MG tablet TAKE 1 TABLET BY MOUTH EVERY DAY 02/16/22  Yes Camnitz, Ocie Doyne, MD  Metoprolol Succinate 50 MG CS24 Take 1/2 tablet by mouth in the morning and 1/2 tablet at bedtime (Total 50 mg ) 01/19/22  Yes Camnitz, Will Hassell Done, MD  NOVOLIN N 100 UNIT/ML injection INJECT 22 UNITS IN THE SKIN DAILY AT BEDTIME. 05/04/22  Yes Elayne Snare, MD  potassium chloride (KLOR-CON 10) 10 MEQ tablet Take 1 tablet (10 mEq total) by mouth as needed. With Lasix 11/03/21  Yes Baldwin Jamaica, PA-C    Current Outpatient Medications  Medication Sig Dispense Refill   aspirin EC 81 MG tablet Take 81 mg by mouth in the morning. Swallow whole.     Aspirin-Caffeine (BAYER BACK & BODY PAIN EX ST PO) Take 1-2 tablets by mouth 2 (two) times daily as needed (pain.).     Continuous Blood Gluc Receiver (DEXCOM G6 RECEIVER) DEVI Use to receive data from sensor 1 each 0   Continuous Blood Gluc Sensor (DEXCOM G6 SENSOR) MISC Use to monitor blood sugar, change after 10 days 3 each 3   dorzolamide-timolol (COSOPT) 22.3-6.8 MG/ML ophthalmic solution Place 1 drop into the left eye 2 (two) times daily.     furosemide (LASIX) 40 MG tablet Take 1 tablet (40 mg total) by mouth daily as needed. 90 tablet 1   insulin aspart (NOVOLOG) 100 UNIT/ML injection Inject 18-20 Units into the skin 3 (three) times daily  with meals. (Patient taking differently: Inject 15-20 Units into the skin 3 (three) times daily with meals. Sliding scale) 30 mL 1    Insulin Syringe-Needle U-100 (BD VEO INSULIN SYRINGE U/F) 31G X 15/64" 0.5 ML MISC Use to inject 4 times a day 400 each 3   latanoprost (XALATAN) 0.005 % ophthalmic solution Place 1 drop into the left eye at bedtime.     lisinopril (ZESTRIL) 5 MG tablet TAKE 1 TABLET BY MOUTH EVERY DAY 90 tablet 2   Metoprolol Succinate 50 MG CS24 Take 1/2 tablet by mouth in the morning and 1/2 tablet at bedtime (Total 50 mg ) 30 capsule 11   NOVOLIN N 100 UNIT/ML injection INJECT 22 UNITS IN THE SKIN DAILY AT BEDTIME. 10 mL 2   potassium chloride (KLOR-CON 10) 10 MEQ tablet Take 1 tablet (10 mEq total) by mouth as needed. With Lasix 30 tablet 1   No current facility-administered medications for this visit.    Allergies  Allergen Reactions   Actos [Pioglitazone] Swelling   Bee Venom Swelling   Codeine Hives and Nausea Only   Statins Other (See Comments)    Muscle pain, fatigue   Glucophage [Metformin Hcl] Nausea Only   Raspberry Itching      Review of Systems:   General:  Normal appetite, + decreased energy, no weight gain, no weight loss, no fever  Cardiac:  + chest pain with exertion, no chest pain at rest, +SOB with mild exertion, no resting SOB, no PND, no orthopnea, no palpitations, + arrhythmia, + atrial fibrillation, + LE edema, + dizzy spells, no syncope  Respiratory:  + exertional shortness of breath, no home oxygen, no productive cough, no dry cough, no bronchitis, no wheezing, no hemoptysis, no asthma, no pain with inspiration or cough, no sleep apnea, no CPAP at night  GI:   no difficulty swallowing, no reflux, no frequent heartburn, no hiatal hernia, no abdominal pain, no constipation, no diarrhea, no hematochezia, no hematemesis, no melena  GU:   no dysuria,  no frequency, no urinary tract infection, no hematuria, no enlarged prostate, no kidney stones, no kidney disease  Vascular:  no pain suggestive of claudication, + pain in feet, no leg cramps, no varicose veins, no DVT, no non-healing  foot ulcer  Neuro:   no stroke, no TIA's, no seizures, no headaches, no temporary blindness one eye,  no slurred speech, + peripheral neuropathy, no chronic pain, no instability of gait, no memory/cognitive dysfunction  Musculoskeletal: no arthriti, no joint swelling, no myalgias, no difficulty walking, normal mobility   Skin:   no rash, no itching, no skin infections, no pressure sores or ulcerations  Psych:   no anxiety, no depression, no nervousness, no unusual recent stress  Eyes:   no blurry vision, no floaters, no recent vision changes, + wears glasses or contacts  ENT:   + hearing loss, no loose or painful teeth, no dentures, last saw dentist last year  Hematologic:  no easy bruising, no abnormal bleeding, no clotting disorder, no frequent epistaxis  Endocrine:  + diabetes, does check CBG's at home     Physical Exam:   BP (!) 148/69 (BP Location: Left Arm, Patient Position: Sitting)   Pulse 94   Resp 18   Ht '5\' 6"'$  (1.676 m)   Wt 196 lb (88.9 kg)   SpO2 97% Comment: RA  BMI 31.64 kg/m   General:  well-appearing  HEENT:  Unremarkable, NCAT, PERLA, EOMI  Neck:   no JVD, no  bruits, no adenopathy   Chest:   clear to auscultation, symmetrical breath sounds, no wheezes, no rhonchi   CV:   RRR, 3/6 systolic murmur RSB, no diastolic murmur  Abdomen:  soft, non-tender, no masses   Extremities:  warm, well-perfused, pulses palpable in feet, + lower extremity edema  Rectal/GU  Deferred  Neuro:   Grossly non-focal and symmetrical throughout  Skin:   Clean and dry, no rashes, no breakdown  Diagnostic Tests:  TRANSESOPHOGEAL ECHO REPORT       Patient Name:   Matthew [name] Date of Exam: 03/13/2022  Medical Rec #:  [id number]            Height:       66.0 in  Accession #:    [id number]           Weight:       200.8 lb  Date of Birth:  06/19/1945           BSA:          2.003 m  Patient Age:    37 years             BP:           166/73 mmHg  Patient Gender: M                     HR:           85 bpm.  Exam Location:  Inpatient   Procedure: Transesophageal Echo, Cardiac Doppler, Color Doppler and 3D  Echo   Indications:    Aortic stenosis                   Mitral valve stenosis    History:         Patient has prior history of Echocardiogram examinations,  most                  recent 11/10/2021. Previous Myocardial Infarction and CAD,                   Arrythmias:Atrial Fibrillation and Atrial Flutter; Risk                   Factors:Diabetes and Dyslipidemia.    Sonographer:     Clayton Lefort RDCS (AE)  Referring Phys:  [facility] CROITORU  Diagnosing Phys: Sanda Klein MD   PROCEDURE: After discussion of the risks and benefits of a TEE, an  informed consent was obtained from the patient. The transesophogeal probe  was passed without difficulty through the esophogus of the patient. Local  oropharyngeal anesthetic was provided  with Cetacaine. Sedation performed by different physician. The patient was  monitored while under deep sedation. Anesthestetic sedation was provided  intravenously by Anesthesiology: 211.'4mg'$  of Propofol. Image quality was  adequate. The patient developed no   complications during the procedure.    IMPRESSIONS     1. Left ventricular ejection fraction, by estimation, is 55 to 60%. The  left ventricle has normal function. The left ventricle has no regional  wall motion abnormalities. There is mild concentric left ventricular  hypertrophy. Left ventricular diastolic  parameters are consistent with Grade II diastolic dysfunction  (pseudonormalization). Elevated left atrial pressure.   2. Right ventricular systolic function is normal. The right ventricular  size is normal. There is normal pulmonary artery systolic pressure. The  estimated right ventricular systolic pressure is 69.6 mmHg.   3. Left atrial size was moderately  dilated. No left atrial/left atrial  appendage thrombus was detected.   4. Right atrial size was moderately dilated.    5. The basal two-thirds of the anterior mitral leaflet are heavily  calcified and almost motionless, whereas the posterior leaflet has  preserved mobility. The mitral valve is degenerative. Mild to moderate  mitral valve regurgitation. Mild mitral  stenosis. The mean mitral valve gradient is 4.0 mmHg with average heart  rate of 77 bpm. Moderate mitral annular calcification.   6. The aortic valve is tricuspid. There is severe calcifcation of the  aortic valve. There is severe thickening of the aortic valve. Aortic valve  regurgitation is trivial. Moderate to severe aortic valve stenosis.   7. There is Moderate (Grade [name]) protruding plaque involving the  descending aorta and aortic arch.   Conclusion(s)/Recommendation(s): The degenerative changes of the mitral  and aortic valves and in particular the appearance of the anterior mitral  leaflet, is strongly suggestive of sequelae of previous radiation therapy.   FINDINGS   Left Ventricle: Left ventricular ejection fraction, by estimation, is 55  to 60%. The left ventricle has normal function. The left ventricle has no  regional wall motion abnormalities. The left ventricular internal cavity  size was normal in size. There is   mild concentric left ventricular hypertrophy. Left ventricular diastolic  parameters are consistent with Grade II diastolic dysfunction  (pseudonormalization).   Right Ventricle: The right ventricular size is normal. No increase in  right ventricular wall thickness. Right ventricular systolic function is  normal. There is normal pulmonary artery systolic pressure. The tricuspid  regurgitant velocity is 2.47 m/s, and   with an assumed right atrial pressure of 3 mmHg, the estimated right  ventricular systolic pressure is 78.9 mmHg.   Left Atrium: Left atrial size was moderately dilated. No left atrial/left  atrial appendage thrombus was detected.   Right Atrium: Right atrial size was moderately dilated.    Pericardium: There is no evidence of pericardial effusion.   Mitral Valve: The basal two-thirds of the anterior mitral leaflet are  heavily calcified and almost motionless, whereas the posterior leaflet has  preserved mobility. The mitral valve is degenerative in appearance. There  is severe thickening of the  anterior mitral valve leaflet(s). There is severe calcification of the  anterior mitral valve leaflet(s). Moderate mitral annular calcification.  Mild to moderate mitral valve regurgitation, with centrally-directed jet.  Mild mitral valve stenosis. Mitral  valve area by planimetry is 3.08 cm, and by the pressure half-time method  is calculated to be 4.14 cm. The mean transmitral gradient is 4.0 mmHg.  MV peak gradient, 14.0 mmHg. The mean mitral valve gradient is 4.0 mmHg  with average heart rate of 77 bpm.   Tricuspid Valve: The tricuspid valve is normal in structure. Tricuspid  valve regurgitation is mild.   Aortic Valve: Unable to align with the aortic ejection jet to measure  accurate gradients. By planimetry the aortic valve area is 1.0 cm sq. The  aortic valve is tricuspid. There is severe calcifcation of the aortic  valve. There is severe thickening of the   aortic valve. Aortic valve regurgitation is trivial. Moderate to severe  aortic stenosis is present.   Pulmonic Valve: The pulmonic valve was normal in structure. Pulmonic valve  regurgitation is not visualized.   Aorta: The aortic root, ascending aorta, aortic arch and descending aorta  are all structurally normal, with no evidence of dilitation or  obstruction. There is moderate (Grade [name]) protruding plaque  involving the  descending aorta and aortic arch.   IAS/Shunts: No atrial level shunt detected by color flow Doppler.     LEFT VENTRICLE  PLAX 2D  LVOT diam:     2.21 cm  LVOT Area:     3.85 cm     MITRAL VALVE               TRICUSPID VALVE  MV Area (PHT):  4.14 cm   TR Peak grad:   24.4 mmHg   MV Area (plan): 3.08 cm   TR Vmax:        247.00 cm/s  MV Peak grad:   14.0 mmHg  MV Mean grad:   4.0 mmHg   SHUNTS  MV Vmax:        1.87 m/s   Systemic Diam: 2.21 cm  MV Vmean:       93.7 cm/s  MV Decel Time:  183 msec   Mihai Croitoru MD  Electronically signed by Sanda Klein MD  Signature Date/Time: 03/13/2022/2:43:30 PM        Final      Physicians  Panel Physicians Referring Physician Case Authorizing Physician  Burnell Blanks, MD (Primary)     Procedures  RIGHT/LEFT HEART CATH AND CORONARY/GRAFT ANGIOGRAPHY   Conclusion      Prox RCA lesion is 100% stenosed.   Ost Cx to Prox Cx lesion is 100% stenosed.   Ost LM to Mid LM lesion is 90% stenosed.   Mid LAD lesion is 100% stenosed.   SVG graft was visualized by angiography and is normal in caliber.   SVG graft was visualized by angiography and is normal in caliber.   LIMA graft was visualized by angiography and is normal in caliber.   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 Patent LIMA to mid LAD Chronic occlusion proximal Circumflex. Patent vein graft to the obtuse marginal branch Chronic occlusion proximal RCA. Patent vein graft to the PDA filling the posterolateral artery as well as the distal RCA Severe low flow/low gradient aortic stenosis by echo. The TEE was reviewed by our structural heart team and the consensus was that the stenosis was severe.  (Cath data: peak to peak gradient 18.8 mmHg, Mean gradient 16.4 mmHg).    Recommendations: Will continue planning for TAVR. We will arrange his CT scans and then plan a visit with one of the CT surgeons on our TAVR team.    Indications  Severe aortic stenosis [I35.0 (ICD-10-CM)]   Procedural Details  Technical Details Indication: Moderately severe to severe aortic stenosis, CAD with prior CABG, recent fatigue and dyspnea.   Procedure: The risks, benefits,  complications, treatment options, and expected outcomes were discussed with the patient. The patient and/or family concurred with the proposed plan, giving informed consent. The patient was sedated with Versed and Fentanyl. The IV catheter in the right antecubital vein was changed for a 7 [place] sheath. Right heart catheterization performed with a balloon tipped catheter.  The left wrist was prepped and draped in a sterile fashion. 1% lidocaine was used for local anesthesia. Using the modified Seldinger access technique, a 5 French sheath was placed in the left radial artery. 3 mg Verapamil was given through the sheath. Weight based IV heparin was given. Standard diagnostic catheters were used to perform selective coronary angiography. I engaged both vein grafts with the JR4 catheter. I engaged the LIMA graft with an IMA  catheter. LV pressures measured with the JR4 catheter. All catheter exchanges were performed over an exchange length guidewire.   The sheath was removed from the left radial artery and a hemostasis band was applied at the arteriotomy site on the left wrist.      Estimated blood loss <50 mL.   During this procedure medications were administered to achieve and maintain moderate conscious sedation while the patient's heart rate, blood pressure, and oxygen saturation were continuously monitored and I was present face-to-face 100% of this time.   Medications (Filter: Administrations occurring from 0732 to 0847 on 04/13/22) Heparin (Porcine) in NaCl 1000-0.9 UT/500ML-% SOLN (mL)  Total volume: 1,000 mL Date/Time Rate/Dose/Volume Action   04/13/22 0739 500 mL Given   0739 500 mL Given   fentaNYL (SUBLIMAZE) injection (mcg)  Total dose: 50 mcg Date/Time Rate/Dose/Volume Action   04/13/22 0744 50 mcg Given   midazolam (VERSED) injection (mg)  Total dose: 2 mg Date/Time Rate/Dose/Volume Action   04/13/22 0744 2 mg Given   lidocaine (PF) (XYLOCAINE) 1 % injection (mL)  Total volume:  5 mL Date/Time Rate/Dose/Volume Action   04/13/22 0759 5 mL Given   Radial Cocktail/Verapamil only (mL)  Total volume: 10 mL Date/Time Rate/Dose/Volume Action   04/13/22 0807 10 mL Given   heparin sodium (porcine) injection (Units)  Total dose: 4,500 Units Date/Time Rate/Dose/Volume Action   04/13/22 0812 4,500 Units Given   iohexol (OMNIPAQUE) 350 MG/ML injection (mL)  Total volume: 85 mL Date/Time Rate/Dose/Volume Action   04/13/22 0831 85 mL Given    Sedation Time  Sedation Time Physician-1: 43 minutes 59 seconds Contrast  Medication Name Total Dose  iohexol (OMNIPAQUE) 350 MG/ML injection 85 mL   Radiation/Fluoro  Fluoro time: 8.3 (min) DAP: 63875 (mGycm2) Cumulative Air Kerma: 643 (mGy) Complications  Complications documented before study signed (04/13/2022  3:29 AM)   No complications were associated with this study.  Documented by Bard Herbert, RN - 04/13/2022  8:31 AM     Coronary Findings  Diagnostic Dominance: Right Left Main  Ost LM to Mid LM lesion is 90% stenosed.    Left Anterior Descending  Mid LAD lesion is 100% stenosed. The lesion is chronically occluded.    Left Circumflex  Ost Cx to Prox Cx lesion is 100% stenosed. The lesion is chronically occluded.    Right Coronary Artery  Vessel is large.  Prox RCA lesion is 100% stenosed. The lesion is chronically occluded.    Saphenous Graft To RPDA  SVG graft was visualized by angiography and is normal in caliber.    Saphenous Graft To 2nd Mrg  SVG graft was visualized by angiography and is normal in caliber.    LIMA LIMA Graft To Dist LAD  LIMA graft was visualized by angiography and is normal in caliber.    Intervention   No interventions have been documented.   Coronary Diagrams  Diagnostic Dominance: Right  Intervention   Implants   No implant documentation for this case.   Syngo Images   Show images for CARDIAC CATHETERIZATION Images on Long Term Storage   Show  images for Matsumoto, Jary L [name] Link to Procedure Log  Procedure Log    Hemo Data  Flowsheet Row Most Recent Value  Fick Cardiac Output 6.97 L/min  Fick Cardiac Output Index 3.51 (L/min)/BSA  Aortic Mean Gradient 16.4 mmHg  Aortic Peak Gradient 18.6 mmHg  Aortic Valve Area 1.52  Aortic Value Area Index 0.77 cm2/BSA  RA A Wave 7 mmHg  RA V Wave 9 mmHg  RA Mean 7 mmHg  RV Systolic Pressure 39 mmHg  RV Diastolic Pressure 1 mmHg  RV EDP 7 mmHg  PA Systolic Pressure 41 mmHg  PA Diastolic Pressure 13 mmHg  PA Mean 24 mmHg  PW A Wave 13 mmHg  PW V Wave 26 mmHg  PW Mean 13 mmHg  AO Systolic Pressure 132 mmHg  AO Diastolic Pressure 42 mmHg  AO Mean 77 mmHg  LV Systolic Pressure 440 mmHg  LV Diastolic Pressure -1 mmHg  LV EDP 5 mmHg  AOp Systolic Pressure 102 mmHg  AOp Diastolic Pressure 43 mmHg  AOp Mean Pressure 76 mmHg  LVp Systolic Pressure 725 mmHg  LVp Diastolic Pressure -10 mmHg  LVp EDP Pressure 2 mmHg  QP/QS 1  TPVR Index 6.83 HRUI  TSVR Index 21.91 HRUI  PVR SVR Ratio 0.16  TPVR/TSVR Ratio 0.31    ADDENDUM REPORT: 04/21/2022 12:04   EXAM: OVER-READ INTERPRETATION  CT CHEST   The following report is an over-read performed by radiologist Dr. Maudry Diego [facility] on 04/21/2022. This over-read does not include interpretation of cardiac or coronary anatomy or pathology. The cardiac TAVR interpretation by the cardiologist is attached.   COMPARISON:  None.   FINDINGS: Extracardiac findings will be described separately under dictation for contemporaneously obtained CTA chest, abdomen and pelvis.   IMPRESSION: Please see separate dictation for contemporaneously obtained CTA chest, abdomen and pelvis dated 04/21/2022 for full description of relevant extracardiac findings.     Electronically Signed   By: Yetta Glassman M.D.   On: 04/21/2022 12:04    Addended by Dena Billet, MD on 04/21/2022 12:07 PM    Study  Result  Narrative & Impression  CLINICAL DATA:  Aortic Stenosis   EXAM: Cardiac TAVR CT   TECHNIQUE: The patient was scanned on a Siemens Force 366 slice scanner. A 120 kV retrospective scan was triggered in the ascending thoracic aorta at 140 HU's. Gantry rotation speed was 250 msecs and collimation was .6 mm. No beta blockade or nitro were given. The 3D data set was reconstructed in 5% intervals of the R-R cycle. Systolic and diastolic phases were analyzed on a dedicated work station using MPR, MIP and VRT modes. The patient received 80 cc of contrast.   FINDINGS: Aortic Valve: Tri leaflet calcified with restricted leaflet motion Score 3888   Aorta: Normal arch vessels no aneurysm severe calcific atherosclerosis   Sino-tubular Junction: 27 mm   Ascending Thoracic Aorta: 31 mm   Aortic Arch: 25 mm   Descending Thoracic Aorta: 24 mm   Sinus of Valsalva Measurements:   Non-coronary: 32.4 mm  Height 22 mm   Right - coronary: 33.3 mm Height 21.3 mm   Left -   coronary: 35.2 mm  Height 22.7 mm   Coronary Artery Height above Annulus:   Left Main: 16.5 mm above annulus   Right Coronary: 17.3 mm above annulus   Virtual Basal Annulus Measurements:   Maximum / Minimum Diameter: 28.6 mm x 24.3 mm   Perimeter: 86.5 mm   Area: 574 mm 2 severe nodular protruding calcification at base of non coronary cusp   Coronary Arteries: Sufficient height above annulus for deployment Patent SVG RCA, Patent SVG to OM and Patent LIMA to LAD   Optimum Fluoroscopic Angle for Delivery: LAO 1 Caudal 25 degrees   Membranous septal length 6.04 mm   IMPRESSION: 1.  Calcified tri leaflet AV with score 3888   2.  Annular area of 574 mm2 suitable for a 29 mm Sapien 3 valve Alternatively a 34 mm Evolut R valve can be considered   3. Coronary arteries sufficient height above annulus for deployment Patent SVG RCA, Patent SVG OM Patent LIMA to LAD   4.  Membranous septal length 6.04 mm    5. Optimum angiographic angle for deployment LAO 1 Caudal 25 degrees   Jenkins Rouge   Electronically Signed: By: Jenkins Rouge M.D. On: 04/21/2022 11:38       Narrative & Impression  CLINICAL DATA:  Preop evaluation for aortic valve replacement   EXAM: CT ANGIOGRAPHY CHEST, ABDOMEN AND PELVIS   TECHNIQUE: Non-contrast CT of the chest was initially obtained.   Multidetector CT imaging through the chest, abdomen and pelvis was performed using the standard protocol during bolus administration of intravenous contrast. Multiplanar reconstructed images and MIPs were obtained and reviewed to evaluate the vascular anatomy.   RADIATION DOSE REDUCTION: This exam was performed according to the departmental dose-optimization program which includes automated exposure control, adjustment of the mA and/or kV according to patient size and/or use of iterative reconstruction technique.   CONTRAST:  122m OMNIPAQUE IOHEXOL 350 MG/ML SOLN   COMPARISON:  None Available.   FINDINGS: CTA CHEST FINDINGS   Cardiovascular: Normal heart size. No pericardial effusion. Caliber thoracic aorta with severe atherosclerotic disease. Severe left main and three-vessel coronary artery calcifications status post CABG. Aortic valve thickening and calcifications. Mitral annular calcifications.   Mediastinum/Nodes: Small hiatal hernia. Thyroid is unremarkable. Mildly enlarged mediastinal lymph nodes, likely reactive, reference subcarinal lymph node measuring 1.2 cm in short axis on series 6, image 89.   Lungs/Pleura: Central airways are patent. Biapical pleural-parenchymal scarring. Bilateral mosaic attenuation. No consolidation, pleural effusion or pneumothorax. Small solid pulmonary nodule of the left upper lobe measuring 3 mm on series 7 image 24.   Musculoskeletal: Prior median sternotomy with intact sternal wires. No acute or significant osseous findings.   CTA ABDOMEN AND PELVIS FINDINGS    Hepatobiliary: No focal liver abnormality is seen. No gallstones, gallbladder wall thickening, or biliary dilatation.   Pancreas: Unremarkable. No pancreatic ductal dilatation or surrounding inflammatory changes.   Spleen: Normal in size without focal abnormality.   Adrenals/Urinary Tract: Bilateral adrenal glands are unremarkable. No hydronephrosis or nephrolithiasis. Bilateral renal scarring. Bilateral low-attenuation renal lesions, largest are compatible with simple cysts, others are too small to completely characterize, no specific follow-up imaging recommended. No hydronephrosis or nephrolithiasis. Bladder is unremarkable.   Stomach/Bowel: Stomach is within normal limits. Appendix appears normal. Diverticulosis. No evidence of bowel wall thickening, distention, or inflammatory changes.   Vascular/lymphatic: Normal caliber abdominal aorta with severe atherosclerotic disease severe narrowing at the origin of the celiac artery and moderate narrowing at the origin of the SMA due to atherosclerotic disease. Two right and single left renal arteries with moderate narrowing of the proximal vessels due to atherosclerotic disease. Moderate narrowing at the origin of the IMA due to atherosclerotic disease. No pathologically enlarged lymph nodes seen in the abdomen or pelvis.   Reproductive: Prostate is unremarkable.   Other: No abdominal wall hernia or abnormality. No abdominopelvic ascites.   Musculoskeletal: No acute or significant osseous findings.   VASCULAR MEASUREMENTS PERTINENT TO TAVR:   AORTA:   Minimal Aortic Diameter-12.9 mm   Severity of Aortic Calcification-severe   RIGHT PELVIS:   Right Common Iliac Artery -   Minimal Diameter-7.6 mm   Tortuosity-moderate   Calcification-severe   Right External Iliac Artery -  Minimal Diameter-5.9 mm   Tortuosity-mild   Calcification-moderate   Right Common Femoral Artery -   Minimal Diameter-5.1 mm    Tortuosity-none   Calcification-severe   LEFT PELVIS:   Left Common Iliac Artery -   Minimal Diameter-6.8 mm   Tortuosity-mild   Calcification-severe   Left External Iliac Artery -   Minimal Diameter-6.7 mm   Tortuosity-mild   Calcification-moderate   Left Common Femoral Artery -   Minimal Diameter-7.6 mm   Tortuosity-none   Calcification-severe   Review of the MIP images confirms the above findings.   IMPRESSION: 1. Vascular findings and measurements pertinent to potential TAVR procedure, as detailed above. 2. Thickening and calcification of the aortic valve, compatible with reported clinical history of aortic stenosis. 3. Severe aortoiliac atherosclerosis. 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 4. Small solid pulmonary nodule of the left upper lobe measuring 3 mm. No follow-up needed if patient is low-risk. Non-contrast chest CT can be considered in 12 months if patient is high-risk. This recommendation follows the consensus statement: Guidelines for Management of Incidental Pulmonary Nodules Detected on CT Images: From the Fleischner Society 2017; Radiology 2017; 284:228-243.     Electronically Signed   By: Yetta Glassman M.D.   On: 04/21/2022 12:01       Impression:  This 77 year old gentleman has stage D, severe, low-flow/low gradient aortic stenosis with NYHA class II-lll symptoms of exertional fatigue and shortness of breath consistent with chronic diastolic congestive heart failure.  He has also had episodes of dizziness, chest discomfort, and significant lower extremity edema.  I have personally reviewed his TEE, cardiac catheterization, and CTA studies.  TEE shows a severely calcified and thickened aortic valve with restricted leaflet mobility.  The aortic valve area by planimetry was 1.0 cm.  It certainly looks like a severely stenotic valve.  There is mild mitral valve stenosis and mild to moderate mitral regurgitation  with a severely calcified anterior mitral leaflet.  Cardiac catheterization shows severe three-vessel coronary disease with occlusion of all 3 major vessels with 3/3 patent grafts.  The peak to peak gradient across the aortic valve was measured at 18.8 mmHg and the mean gradient 16.4 mmHg.  I agree that aortic valve replacement is indicated in this patient for relief of his progressively worsening symptoms of congestive heart failure and to prevent progressive left ventricular dysfunction.  Given his age and prior coronary bypass surgery I think that transcatheter aortic valve replacement would be the best treatment option for him.  His gated cardiac CTA shows anatomy suitable for TAVR using a 29 mm SAPIEN 3 valve.  His sinotubular junction was measured at 27.4 mmHg.  His abdominal and pelvic CTA shows adequate pelvic vascular anatomy to allow transfemoral insertion.  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  It may be necessary to implant his valve lowered due to the smaller sinotubular junction diameter and he is at increased risk for needing a permanent pacemaker.  The patient and his wife were counseled at length regarding treatment alternatives for management of severe symptomatic aortic stenosis. The risks and benefits of surgical intervention has been discussed in detail. Long-term prognosis with medical therapy was discussed. Alternative approaches such as conventional surgical aortic valve replacement, transcatheter aortic valve replacement, and palliative medical therapy were compared and contrasted at length. This discussion was placed in the context of the patient's own specific clinical presentation and past medical history. All of their questions  have been addressed.   Following the decision to proceed with transcatheter aortic valve replacement, a discussion was held regarding what types of management strategies would be attempted intraoperatively in the event  of life-threatening complications, including whether or not the patient would be considered a candidate for the use of cardiopulmonary bypass and/or conversion to open sternotomy for attempted surgical intervention.  I do not think he is a candidate for emergent sternotomy to manage any intraoperative complications since he has already had coronary bypass surgery and radiation to his mediastinum.  The patient is aware of the fact that transient use of cardiopulmonary bypass may be necessary. The patient has been advised of a variety of complications that might develop including but not limited to risks of death, stroke, paravalvular leak, aortic dissection or other major vascular complications, aortic annulus rupture, device embolization, cardiac rupture or perforation, mitral regurgitation, acute myocardial infarction, arrhythmia, heart block or bradycardia requiring permanent pacemaker placement, congestive heart failure, respiratory failure, renal failure, pneumonia, infection, other late complications related to structural valve deterioration or migration, or other complications that might ultimately cause a temporary or permanent loss of functional independence or other long term morbidity. The patient provides full informed consent for the procedure as described and all questions were answered.      Plan:  He will be scheduled for transfemoral TAVR using a SAPIEN 3 valve on 06/02/2022.  I spent 60 minutes performing this consultation and > 50% of this time was spent face to face counseling and coordinating the care of this patient's severe, symptomatic aortic stenosis.   Gaye Pollack, MD 05/20/2022 1:13 PM

## 2022-05-22 ENCOUNTER — Other Ambulatory Visit: Payer: Self-pay

## 2022-05-22 DIAGNOSIS — I350 Nonrheumatic aortic (valve) stenosis: Secondary | ICD-10-CM

## 2022-05-29 ENCOUNTER — Ambulatory Visit (HOSPITAL_COMMUNITY)
Admission: RE | Admit: 2022-05-29 | Discharge: 2022-05-29 | Disposition: A | Discharge status: Home / Self Care | Payer: Medicare HMO | Source: Ambulatory Visit | Attending: Cardiovascular Disease | Admitting: Cardiovascular Disease

## 2022-05-29 ENCOUNTER — Encounter (HOSPITAL_COMMUNITY)
Admission: RE | Admit: 2022-05-29 | Discharge: 2022-05-29 | Disposition: A | Discharge status: Home / Self Care | Payer: Medicare HMO | Source: Ambulatory Visit | Attending: Cardiovascular Disease | Admitting: Cardiovascular Disease

## 2022-05-29 DIAGNOSIS — Z01818 Encounter for other preprocedural examination: Secondary | ICD-10-CM | POA: Insufficient documentation

## 2022-05-29 DIAGNOSIS — I350 Nonrheumatic aortic (valve) stenosis: Secondary | ICD-10-CM | POA: Diagnosis not present

## 2022-05-29 DIAGNOSIS — Z1152 Encounter for screening for COVID-19: Secondary | ICD-10-CM | POA: Diagnosis not present

## 2022-05-29 LAB — TYPE AND SCREEN
ABO/RH(D): A POS
Antibody Screen: NEGATIVE

## 2022-05-29 LAB — COMPREHENSIVE METABOLIC PANEL
ALT: 15 U/L (ref 0–44)
AST: 23 U/L (ref 15–41)
Albumin: 3.5 g/dL (ref 3.5–5.0)
Alkaline Phosphatase: 78 U/L (ref 38–126)
Anion gap: 8 (ref 5–15)
BUN: 19 mg/dL (ref 8–23)
CO2: 28 mmol/L (ref 22–32)
Calcium: 8.9 mg/dL (ref 8.9–10.3)
Chloride: 103 mmol/L (ref 98–111)
Creatinine, Ser: 1.16 mg/dL (ref 0.61–1.24)
GFR, Estimated: >60 mL/min (ref >60)
Glucose, Bld: 208 mg/dL — ABNORMAL HIGH (ref 70–99)
Potassium: 4.6 mmol/L (ref 3.5–5.1)
Sodium: 139 mmol/L (ref 135–145)
Total Bilirubin: 0.6 mg/dL (ref 0.3–1.2)
Total Protein: 6.5 g/dL (ref 6.5–8.1)

## 2022-05-29 LAB — CBC
HCT: 37.7 % — ABNORMAL LOW (ref 39.0–52.0)
Hemoglobin: 12.3 g/dL — ABNORMAL LOW (ref 13.0–17.0)
MCH: 29.3 pg (ref 26.0–34.0)
MCHC: 32.6 g/dL (ref 30.0–36.0)
MCV: 89.8 fL (ref 80.0–100.0)
Platelets: 248 K/uL (ref 150–400)
RBC: 4.2 MIL/uL — ABNORMAL LOW (ref 4.22–5.81)
RDW: 13.4 % (ref 11.5–15.5)
WBC: 7.3 K/uL (ref 4.0–10.5)
nRBC: 0 % (ref 0.0–0.2)

## 2022-05-29 LAB — URINALYSIS, ROUTINE W REFLEX MICROSCOPIC
Bilirubin Urine: NEGATIVE
Glucose, UA: NEGATIVE mg/dL
Hgb urine dipstick: NEGATIVE
Ketones, ur: NEGATIVE mg/dL
Leukocytes,Ua: NEGATIVE
Nitrite: NEGATIVE
Protein, ur: NEGATIVE mg/dL
Specific Gravity, Urine: 1.015 (ref 1.005–1.030)
pH: 5 (ref 5.0–8.0)

## 2022-05-29 LAB — SURGICAL PCR SCREEN
MRSA, PCR: NEGATIVE
Staphylococcus aureus: NEGATIVE

## 2022-05-29 LAB — SARS CORONAVIRUS 2 (TAT 6-24 HRS): SARS Coronavirus 2: NEGATIVE

## 2022-05-29 LAB — PROTIME-INR
INR: 1 (ref 0.8–1.2)
Prothrombin Time: 13.1 seconds (ref 11.4–15.2)

## 2022-05-29 NOTE — Progress Notes (Signed)
CHG soap given to pt with instructions. Instructions explained and all questions answered.

## 2022-06-01 MED ORDER — HEPARIN 30,000 UNITS/1000 ML (OHS) CELLSAVER SOLUTION
Status: DC
  Filled 2022-06-01: qty 1000

## 2022-06-01 MED ORDER — CEFAZOLIN SODIUM-DEXTROSE 2-4 GM/100ML-% IV SOLN
2.0000 g | INTRAVENOUS | Status: AC
  Administered 2022-06-02: 2 g via INTRAVENOUS
  Filled 2022-06-01 (×2): qty 100

## 2022-06-01 MED ORDER — MAGNESIUM SULFATE 50 % IJ SOLN
40.0000 meq | INTRAMUSCULAR | Status: DC
  Filled 2022-06-01: qty 9.85

## 2022-06-01 MED ORDER — NOREPINEPHRINE 4 MG/250ML-% IV SOLN
0.0000 ug/min | INTRAVENOUS | Status: AC
  Administered 2022-06-02: 2 ug/min via INTRAVENOUS
  Filled 2022-06-01: qty 250

## 2022-06-01 MED ORDER — DEXMEDETOMIDINE HCL IN NACL 400 MCG/100ML IV SOLN
0.1000 ug/kg/h | INTRAVENOUS | Status: AC
  Administered 2022-06-02: 88.92 ug via INTRAVENOUS
  Filled 2022-06-01: qty 100

## 2022-06-01 MED ORDER — POTASSIUM CHLORIDE 2 MEQ/ML IV SOLN
80.0000 meq | INTRAVENOUS | Status: DC
  Filled 2022-06-01: qty 40

## 2022-06-01 NOTE — H&P (Signed)
[address]       [place] [address]             [phone]      Cardiothoracic Surgery Admission History and Physical   PCP is Wendie Agreste, MD Referring Provider is Lauree Chandler, MD Primary Cardiologist is Will Meredith Leeds, MD   Reason for admission: Severe aortic stenosis   HPI:   The patient is a 77 year old gentleman with a history of diabetes, hyperlipidemia, testicular cancer status post surgery and radiation to his chest, atrial fibrillation/flutter status post ablation x 3, coronary artery disease status post CABG in 2011 and aortic stenosis who was referred for consideration of TAVR.  He had a TEE on 03/13/2022 showing trileaflet aortic valve with severe calcification and thickening of the leaflets.  By planimetry the aortic valve area was 1 cm.  Left ventricular ejection fraction was 55 to 60% with mild concentric LVH and grade 2 diastolic dysfunction.  The anterior leaflet of the mitral valve was heavily calcified and motionless with preserved posterior leaflet motility.  There was mild to moderate mitral valve regurgitation and mild mitral stenosis with a mean gradient of 4 mmHg.  He presented for catheterization on 03/27/2022 but was feeling poorly with chills and diaphoresis as well as hyperglycemia and his catheterization was canceled at that time.  He was admitted for further workup.  Cardiac catheterization was performed on 04/13/2022 showing severe three-vessel coronary disease with 3/3 patent bypass grafts.  The peak to peak gradient across the aortic valve was 18.8 mmHg with a mean gradient of 16.4 mmHg.   The patient is married and lives with his wife.  He has noted progressive exertional shortness of breath and fatigue over the past several months and said that he felt like he might not make it a few times.  He has had episodes of dizziness particularly when bending over or standing up.  He has had lower extremity edema which  resolves overnight.  He has had occasional episodes of chest discomfort recently.       Past Medical History:  Diagnosis Date   Anemia     Arthritis      "back, left ankle" (11/20/2015)   Cataract     CHF (congestive heart failure) (HCC)     Chronic lower back pain     Coronary artery disease      a. 05/2009 CABG x 3: LIMA->LAD, VG->OM, VG->PDA; b. Nuc 01/2014: inf-lat scar but no ischemia, EF 57%.   Diabetic retinopathy (HCC)      mild- Dr. Herbert Deaner   Glaucoma     Hyperlipidemia      a. Intolerant of lipitor and vytorin.   Lyme disease     Migraine      "once or twice" (11/20/2015)   Myocardial infarction [facility])      'saw evidence of it on an EKG done in 2005"   Paroxysmal atrial flutter ([place])      a. 01/2014 s/p DCCV;  b. CHA2DS2VASc = 3-->Eliquis.   Pneumonia 1960   Sacral pain      "right"   Sciatica of right side     Testicular cancer ([place]) 1982    "heavy doses of radiation; it was stage 2"   Type II diabetes mellitus ([place])     Valvular heart disease      a. 01/2014 Echo: Ef 60-65%, no rwma, mild AI/MS, mod MR, mildly dil LA.  Past Surgical History:  Procedure Laterality Date   ABDOMINAL EXPLORATION SURGERY   ~ 2005    "for hernia, but didn't have one"   ATRIAL FIBRILLATION ABLATION N/A 09/17/2016    Procedure: Atrial Fibrillation Ablation;  Surgeon: [name], MD;  Location: [facility];  Service: Cardiovascular;  Laterality: N/A;   CARDIAC CATHETERIZATION   2011   CARDIOVERSION N/A 02/19/2014    Procedure: CARDIOVERSION;  Surgeon: [name], MD;  Location: [facility];  Service: Cardiovascular;  Laterality: N/A;   CARDIOVERSION N/A 10/28/2015    Procedure: CARDIOVERSION;  Surgeon: [name], MD;  Location: [facility];  Service: Cardiovascular;  Laterality: N/A;   CARDIOVERSION   11/20/2015    "200 joules"   CORONARY ARTERY BYPASS GRAFT   05/2009    LIMA-LAD, SVG-OM, SVG-PDA 06/20/09   ELBOW FRACTURE SURGERY Left      broken  ulna on left elbow-surgical repair   ELECTROPHYSIOLOGIC STUDY N/A 10/31/2014    Procedure: A-Flutter;  Surgeon: [name], MD;  Location: [facility];  Service: Cardiovascular;  Laterality: N/A;   ELECTROPHYSIOLOGIC STUDY N/A 11/20/2015    Procedure: A-Flutter Ablation;  Surgeon: [name], MD;  Location: [facility];  Service: Cardiovascular;  Laterality: N/A;   EYE SURGERY       FRACTURE SURGERY       RIGHT/LEFT HEART CATH AND CORONARY/GRAFT ANGIOGRAPHY N/A 04/13/2022    Procedure: RIGHT/LEFT HEART CATH AND CORONARY/GRAFT ANGIOGRAPHY;  Surgeon: Burnell Blanks, MD;  Location: [facility];  Service: Cardiovascular;  Laterality: N/A;   TEE WITHOUT CARDIOVERSION N/A 03/13/2022    Procedure: TRANSESOPHAGEAL ECHOCARDIOGRAM (TEE);  Surgeon: Sanda Klein, MD;  Location: [facility];  Service: Cardiovascular;  Laterality: N/A;   TESTICLE REMOVAL Right 1982   TONSILLECTOMY   ~ 1967           Family History  Problem Relation Age of Onset   Diabetes Mother     Heart disease Mother          Heart murmur   Heart disease Father     Heart attack Father     Diabetes Father     Hypertension Father     Diabetes Sister        Social History         Socioeconomic History   Marital status: Married      Spouse name: Not on file   Number of children: 0   Years of education: Not on file   Highest education level: Not on file  Occupational History   Occupation: Photo journalist  Tobacco Use   Smoking status: Former      Packs/day: 0.50      Years: 15.00      Total pack years: 7.50      Types: Cigarettes      Quit date: 1990      Years since quitting: 34.0   Smokeless tobacco: Never   Tobacco comments:      "quit smoking cigarettes in the 1980's; don't know how much or for how long"  Vaping Use   Vaping Use: Never used  Substance and Sexual Activity   Alcohol use: Yes      Alcohol/week: 3.0 standard drinks of alcohol      Types: 3 Cans of beer  per week   Drug use: No      Comment: "quit smoking pot in the 1990s"   Sexual activity: Not Currently  Birth control/protection: Coitus interruptus  Other Topics Concern   Not on file  Social History Narrative   Not on file    Social Determinants of Health        Financial Resource Strain: Not on file  Food Insecurity: No Food Insecurity (03/28/2022)    Hunger Vital Sign     Worried About Running Out of Food in the Last Year: Never true     Ran Out of Food in the Last Year: Never true  Transportation Needs: No Transportation Needs (03/28/2022)    PRAPARE - Armed forces logistics/support/administrative officer (Medical): No     Lack of Transportation (Non-Medical): No  Physical Activity: Not on file  Stress: Not on file  Social Connections: Not on file  Intimate Partner Violence: Not At Risk (03/28/2022)    Humiliation, Afraid, Rape, and Kick questionnaire     Fear of Current or Ex-Partner: No     Emotionally Abused: No     Physically Abused: No     Sexually Abused: No             Prior to Admission medications   Medication Sig Start Date End Date Taking? Authorizing Provider  aspirin EC 81 MG tablet Take 81 mg by mouth in the morning. Swallow whole.     Yes [provider]  Aspirin-Caffeine (BAYER BACK & BODY PAIN EX ST PO) Take 1-2 tablets by mouth 2 (two) times daily as needed (pain.).     Yes [provider]  Continuous Blood Gluc Receiver (DEXCOM G6 RECEIVER) DEVI Use to receive data from sensor 05/23/21   Yes Elayne Snare, MD  Continuous Blood Gluc Sensor (DEXCOM G6 SENSOR) MISC Use to monitor blood sugar, change after 10 days 05/23/21   Yes Elayne Snare, MD  dorzolamide-timolol (COSOPT) 22.3-6.8 MG/ML ophthalmic solution Place 1 drop into the left eye 2 (two) times daily. 10/01/21   Yes [provider]  furosemide (LASIX) 40 MG tablet Take 1 tablet (40 mg total) by mouth daily as needed. 08/14/21   Yes Camnitz, Will Hassell Done, MD  insulin aspart (NOVOLOG) 100  UNIT/ML injection Inject 18-20 Units into the skin 3 (three) times daily with meals. Patient taking differently: Inject 15-20 Units into the skin 3 (three) times daily with meals. Sliding scale 03/16/22   Yes Elayne Snare, MD  Insulin Syringe-Needle U-100 (BD VEO INSULIN SYRINGE U/F) 31G X 15/64" 0.5 ML MISC Use to inject 4 times a day 08/11/21   Yes Elayne Snare, MD  latanoprost (XALATAN) 0.005 % ophthalmic solution Place 1 drop into the left eye at bedtime. 04/01/21   Yes [provider]  lisinopril (ZESTRIL) 5 MG tablet TAKE 1 TABLET BY MOUTH EVERY DAY 02/16/22   Yes Camnitz, Ocie Doyne, MD  Metoprolol Succinate 50 MG CS24 Take 1/2 tablet by mouth in the morning and 1/2 tablet at bedtime (Total 50 mg ) 01/19/22   Yes Camnitz, Will Hassell Done, MD  NOVOLIN N 100 UNIT/ML injection INJECT 22 UNITS IN THE SKIN DAILY AT BEDTIME. 05/04/22   Yes Elayne Snare, MD  potassium chloride (KLOR-CON 10) 10 MEQ tablet Take 1 tablet (10 mEq total) by mouth as needed. With Lasix 11/03/21   Yes Baldwin Jamaica, PA-C            Current Outpatient Medications  Medication Sig Dispense Refill   aspirin EC 81 MG tablet Take 81 mg by mouth in the morning. Swallow whole.       Aspirin-Caffeine (  BAYER BACK & BODY PAIN EX ST PO) Take 1-2 tablets by mouth 2 (two) times daily as needed (pain.).       Continuous Blood Gluc Receiver (DEXCOM G6 RECEIVER) DEVI Use to receive data from sensor 1 each 0   Continuous Blood Gluc Sensor (DEXCOM G6 SENSOR) MISC Use to monitor blood sugar, change after 10 days 3 each 3   dorzolamide-timolol (COSOPT) 22.3-6.8 MG/ML ophthalmic solution Place 1 drop into the left eye 2 (two) times daily.       furosemide (LASIX) 40 MG tablet Take 1 tablet (40 mg total) by mouth daily as needed. 90 tablet 1   insulin aspart (NOVOLOG) 100 UNIT/ML injection Inject 18-20 Units into the skin 3 (three) times daily with meals. (Patient taking differently: Inject 15-20 Units into the skin 3 (three) times daily with  meals. Sliding scale) 30 mL 1   Insulin Syringe-Needle U-100 (BD VEO INSULIN SYRINGE U/F) 31G X 15/64" 0.5 ML MISC Use to inject 4 times a day 400 each 3   latanoprost (XALATAN) 0.005 % ophthalmic solution Place 1 drop into the left eye at bedtime.       lisinopril (ZESTRIL) 5 MG tablet TAKE 1 TABLET BY MOUTH EVERY DAY 90 tablet 2   Metoprolol Succinate 50 MG CS24 Take 1/2 tablet by mouth in the morning and 1/2 tablet at bedtime (Total 50 mg ) 30 capsule 11   NOVOLIN N 100 UNIT/ML injection INJECT 22 UNITS IN THE SKIN DAILY AT BEDTIME. 10 mL 2   potassium chloride (KLOR-CON 10) 10 MEQ tablet Take 1 tablet (10 mEq total) by mouth as needed. With Lasix 30 tablet 1    No current facility-administered medications for this visit.           Allergies  Allergen Reactions   Actos [Pioglitazone] Swelling   Bee Venom Swelling   Codeine Hives and Nausea Only   Statins Other (See Comments)      Muscle pain, fatigue   Glucophage [Metformin Hcl] Nausea Only   Raspberry Itching          Review of Systems:               General:                      Normal appetite, + decreased energy, no weight gain, no weight loss, no fever             Cardiac:                       + chest pain with exertion, no chest pain at rest, +SOB with mild exertion, no resting SOB, no PND, no orthopnea, no palpitations, + arrhythmia, + atrial fibrillation, + LE edema, + dizzy spells, no syncope             Respiratory:                 + exertional shortness of breath, no home oxygen, no productive cough, no dry cough, no bronchitis, no wheezing, no hemoptysis, no asthma, no pain with inspiration or cough, no sleep apnea, no CPAP at night             GI:                               no difficulty swallowing, no reflux, no frequent heartburn, no hiatal hernia,  no abdominal pain, no constipation, no diarrhea, no hematochezia, no hematemesis, no melena             GU:                              no dysuria,  no frequency, no  urinary tract infection, no hematuria, no enlarged prostate, no kidney stones, no kidney disease             Vascular:                     no pain suggestive of claudication, + pain in feet, no leg cramps, no varicose veins, no DVT, no non-healing foot ulcer             Neuro:                         no stroke, no TIA's, no seizures, no headaches, no temporary blindness one eye,  no slurred speech, + peripheral neuropathy, no chronic pain, no instability of gait, no memory/cognitive dysfunction             Musculoskeletal:         no arthriti, no joint swelling, no myalgias, no difficulty walking, normal mobility              Skin:                            no rash, no itching, no skin infections, no pressure sores or ulcerations             Psych:                         no anxiety, no depression, no nervousness, no unusual recent stress             Eyes:                           no blurry vision, no floaters, no recent vision changes, + wears glasses or contacts             ENT:                            + hearing loss, no loose or painful teeth, no dentures, last saw dentist last year             Hematologic:               no easy bruising, no abnormal bleeding, no clotting disorder, no frequent epistaxis             Endocrine:                   + diabetes, does check CBG's at home                            Physical Exam:               BP (!) 148/69 (BP Location: Left Arm, Patient Position: Sitting)   Pulse 94   Resp 18   Ht '5\' 6"'$  (1.676 m)   Wt 196 lb (88.9 kg)   SpO2 97% Comment: RA  BMI 31.64 kg/m  General:                      well-appearing             HEENT:                       Unremarkable, NCAT, PERLA, EOMI             Neck:                           no JVD, no bruits, no adenopathy              Chest:                          clear to auscultation, symmetrical breath sounds, no wheezes, no rhonchi              CV:                              RRR, 3/6 systolic  murmur RSB, no diastolic murmur             Abdomen:                    soft, non-tender, no masses              Extremities:                 warm, well-perfused, pulses palpable in feet, + lower extremity edema             Rectal/GU                   Deferred             Neuro:                         Grossly non-focal and symmetrical throughout             Skin:                            Clean and dry, no rashes, no breakdown   Diagnostic Tests:   TRANSESOPHOGEAL ECHO REPORT       Patient Name:   Aldin L Mcgue III Date of Exam: 03/13/2022  Medical Rec #:  [id number]            Height:       66.0 in  Accession #:    [id number]           Weight:       200.8 lb  Date of Birth:  08/29/1945           BSA:          2.003 m  Patient Age:    89 years             BP:           166/73 mmHg  Patient Gender: M                    HR:           85 bpm.  Exam Location:  Inpatient   Procedure: Transesophageal Echo, Cardiac Doppler, Color Doppler and 3D  Echo  Indications:    Aortic stenosis                   Mitral valve stenosis    History:         Patient has prior history of Echocardiogram examinations,  most                  recent 11/10/2021. Previous Myocardial Infarction and CAD,                   Arrythmias:Atrial Fibrillation and Atrial Flutter; Risk                   Factors:Diabetes and Dyslipidemia.    Sonographer:     Clayton Lefort RDCS (AE)  Referring Phys:  [facility] CROITORU  Diagnosing Phys: Sanda Klein MD   PROCEDURE: After discussion of the risks and benefits of a TEE, an  informed consent was obtained from the patient. The transesophogeal probe  was passed without difficulty through the esophogus of the patient. Local  oropharyngeal anesthetic was provided  with Cetacaine. Sedation performed by different physician. The patient was  monitored while under deep sedation. Anesthestetic sedation was provided  intravenously by Anesthesiology: 211.'4mg'$  of Propofol. Image  quality was  adequate. The patient developed no   complications during the procedure.    IMPRESSIONS     1. Left ventricular ejection fraction, by estimation, is 55 to 60%. The  left ventricle has normal function. The left ventricle has no regional  wall motion abnormalities. There is mild concentric left ventricular  hypertrophy. Left ventricular diastolic  parameters are consistent with Grade II diastolic dysfunction  (pseudonormalization). Elevated left atrial pressure.   2. Right ventricular systolic function is normal. The right ventricular  size is normal. There is normal pulmonary artery systolic pressure. The  estimated right ventricular systolic pressure is 18.5 mmHg.   3. Left atrial size was moderately dilated. No left atrial/left atrial  appendage thrombus was detected.   4. Right atrial size was moderately dilated.   5. The basal two-thirds of the anterior mitral leaflet are heavily  calcified and almost motionless, whereas the posterior leaflet has  preserved mobility. The mitral valve is degenerative. Mild to moderate  mitral valve regurgitation. Mild mitral  stenosis. The mean mitral valve gradient is 4.0 mmHg with average heart  rate of 77 bpm. Moderate mitral annular calcification.   6. The aortic valve is tricuspid. There is severe calcifcation of the  aortic valve. There is severe thickening of the aortic valve. Aortic valve  regurgitation is trivial. Moderate to severe aortic valve stenosis.   7. There is Moderate (Grade III) protruding plaque involving the  descending aorta and aortic arch.   Conclusion(s)/Recommendation(s): The degenerative changes of the mitral  and aortic valves and in particular the appearance of the anterior mitral  leaflet, is strongly suggestive of sequelae of previous radiation therapy.   FINDINGS   Left Ventricle: Left ventricular ejection fraction, by estimation, is 55  to 60%. The left ventricle has normal function. The left  ventricle has no  regional wall motion abnormalities. The left ventricular internal cavity  size was normal in size. There is   mild concentric left ventricular hypertrophy. Left ventricular diastolic  parameters are consistent with Grade II diastolic dysfunction  (pseudonormalization).   Right Ventricle: The right ventricular size is normal. No increase in  right ventricular wall thickness. Right ventricular systolic function is  normal. There is normal pulmonary artery systolic pressure.  The tricuspid  regurgitant velocity is 2.47 m/s, and   with an assumed right atrial pressure of 3 mmHg, the estimated right  ventricular systolic pressure is 38.1 mmHg.   Left Atrium: Left atrial size was moderately dilated. No left atrial/left  atrial appendage thrombus was detected.   Right Atrium: Right atrial size was moderately dilated.   Pericardium: There is no evidence of pericardial effusion.   Mitral Valve: The basal two-thirds of the anterior mitral leaflet are  heavily calcified and almost motionless, whereas the posterior leaflet has  preserved mobility. The mitral valve is degenerative in appearance. There  is severe thickening of the  anterior mitral valve leaflet(s). There is severe calcification of the  anterior mitral valve leaflet(s). Moderate mitral annular calcification.  Mild to moderate mitral valve regurgitation, with centrally-directed jet.  Mild mitral valve stenosis. Mitral  valve area by planimetry is 3.08 cm, and by the pressure half-time method  is calculated to be 4.14 cm. The mean transmitral gradient is 4.0 mmHg.  MV peak gradient, 14.0 mmHg. The mean mitral valve gradient is 4.0 mmHg  with average heart rate of 77 bpm.   Tricuspid Valve: The tricuspid valve is normal in structure. Tricuspid  valve regurgitation is mild.   Aortic Valve: Unable to align with the aortic ejection jet to measure  accurate gradients. By planimetry the aortic valve area is 1.0 cm  sq. The  aortic valve is tricuspid. There is severe calcifcation of the aortic  valve. There is severe thickening of the   aortic valve. Aortic valve regurgitation is trivial. Moderate to severe  aortic stenosis is present.   Pulmonic Valve: The pulmonic valve was normal in structure. Pulmonic valve  regurgitation is not visualized.   Aorta: The aortic root, ascending aorta, aortic arch and descending aorta  are all structurally normal, with no evidence of dilitation or  obstruction. There is moderate (Grade III) protruding plaque involving the  descending aorta and aortic arch.   IAS/Shunts: No atrial level shunt detected by color flow Doppler.     LEFT VENTRICLE  PLAX 2D  LVOT diam:     2.21 cm  LVOT Area:     3.85 cm     MITRAL VALVE               TRICUSPID VALVE  MV Area (PHT):  4.14 cm   TR Peak grad:   24.4 mmHg  MV Area (plan): 3.08 cm   TR Vmax:        247.00 cm/s  MV Peak grad:   14.0 mmHg  MV Mean grad:   4.0 mmHg   SHUNTS  MV Vmax:        1.87 m/s   Systemic Diam: 2.21 cm  MV Vmean:       93.7 cm/s  MV Decel Time:  183 msec   Mihai Croitoru MD  Electronically signed by Sanda Klein MD  Signature Date/Time: 03/13/2022/2:43:30 PM        Final        Physicians   Panel Physicians Referring Physician Case Authorizing Physician  Burnell Blanks, MD (Primary)        Procedures   RIGHT/LEFT HEART CATH AND CORONARY/GRAFT ANGIOGRAPHY    Conclusion       Prox RCA lesion is 100% stenosed.   Ost Cx to Prox Cx lesion is 100% stenosed.   Ost LM to Mid LM lesion is 90% stenosed.   Mid LAD lesion is 100% stenosed.  SVG graft was visualized by angiography and is normal in caliber.   SVG graft was visualized by angiography and is normal in caliber.   LIMA graft was visualized by angiography and is normal in caliber.   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 Patent  LIMA to mid LAD Chronic occlusion proximal Circumflex. Patent vein graft to the obtuse marginal branch Chronic occlusion proximal RCA. Patent vein graft to the PDA filling the posterolateral artery as well as the distal RCA Severe low flow/low gradient aortic stenosis by echo. The TEE was reviewed by our structural heart team and the consensus was that the stenosis was severe.  (Cath data: peak to peak gradient 18.8 mmHg, Mean gradient 16.4 mmHg).    Recommendations: Will continue planning for TAVR. We will arrange his CT scans and then plan a visit with one of the CT surgeons on our TAVR team.    Indications   Severe aortic stenosis [I35.0 (ICD-10-CM)]    Procedural Details   Technical Details Indication: Moderately severe to severe aortic stenosis, CAD with prior CABG, recent fatigue and dyspnea.   Procedure: The risks, benefits, complications, treatment options, and expected outcomes were discussed with the patient. The patient and/or family concurred with the proposed plan, giving informed consent. The patient was sedated with Versed and Fentanyl. The IV catheter in the right antecubital vein was changed for a 7 [place] sheath. Right heart catheterization performed with a balloon tipped catheter.  The left wrist was prepped and draped in a sterile fashion. 1% lidocaine was used for local anesthesia. Using the modified Seldinger access technique, a 5 French sheath was placed in the left radial artery. 3 mg Verapamil was given through the sheath. Weight based IV heparin was given. Standard diagnostic catheters were used to perform selective coronary angiography. I engaged both vein grafts with the JR4 catheter. I engaged the LIMA graft with an IMA catheter. LV pressures measured with the JR4 catheter. All catheter exchanges were performed over an exchange length guidewire.   The sheath was removed from the left radial artery and a hemostasis band was applied at the arteriotomy site on the left  wrist.      Estimated blood loss <50 mL.   During this procedure medications were administered to achieve and maintain moderate conscious sedation while the patient's heart rate, blood pressure, and oxygen saturation were continuously monitored and I was present face-to-face 100% of this time.    Medications (Filter: Administrations occurring from 0732 to 0847 on 04/13/22) Heparin (Porcine) in NaCl 1000-0.9 UT/500ML-% SOLN (mL)  Total volume: 1,000 mL Date/Time Rate/Dose/Volume Action    04/13/22 0739 500 mL Given    0739 500 mL Given    fentaNYL (SUBLIMAZE) injection (mcg)  Total dose: 50 mcg Date/Time Rate/Dose/Volume Action    04/13/22 0744 50 mcg Given    midazolam (VERSED) injection (mg)  Total dose: 2 mg Date/Time Rate/Dose/Volume Action    04/13/22 0744 2 mg Given    lidocaine (PF) (XYLOCAINE) 1 % injection (mL)  Total volume: 5 mL Date/Time Rate/Dose/Volume Action    04/13/22 0759 5 mL Given    Radial Cocktail/Verapamil only (mL)  Total volume: 10 mL Date/Time Rate/Dose/Volume Action    04/13/22 0807 10 mL Given    heparin sodium (porcine) injection (Units)  Total dose: 4,500 Units Date/Time Rate/Dose/Volume Action    04/13/22 0812 4,500 Units Given    iohexol (OMNIPAQUE) 350  MG/ML injection (mL)  Total volume: 85 mL Date/Time Rate/Dose/Volume Action    04/13/22 0831 85 mL Given      Sedation Time   Sedation Time Physician-1: 43 minutes 59 seconds Contrast   Medication Name Total Dose  iohexol (OMNIPAQUE) 350 MG/ML injection 85 mL    Radiation/Fluoro   Fluoro time: 8.3 (min) DAP: 43154 (mGycm2) Cumulative Air Kerma: 008 (mGy) Complications   Complications documented before study signed (04/13/2022  6:76 AM)    No complications were associated with this study.  Documented by Bard Herbert, RN - 04/13/2022  8:31 AM      Coronary Findings   Diagnostic Dominance: Right Left Main  Ost LM to Mid LM lesion is 90% stenosed.    Left Anterior  Descending  Mid LAD lesion is 100% stenosed. The lesion is chronically occluded.    Left Circumflex  Ost Cx to Prox Cx lesion is 100% stenosed. The lesion is chronically occluded.    Right Coronary Artery  Vessel is large.  Prox RCA lesion is 100% stenosed. The lesion is chronically occluded.    Saphenous Graft To RPDA  SVG graft was visualized by angiography and is normal in caliber.    Saphenous Graft To 2nd Mrg  SVG graft was visualized by angiography and is normal in caliber.    LIMA LIMA Graft To Dist LAD  LIMA graft was visualized by angiography and is normal in caliber.    Intervention    No interventions have been documented.    Coronary Diagrams   Diagnostic Dominance: Right  Intervention    Implants    No implant documentation for this case.    Syngo Images    Show images for CARDIAC CATHETERIZATION Images on Long Term Storage    Show images for Twombly, Dartanian L III Link to Procedure Log   Procedure Log    Hemo Data   Flowsheet Row Most Recent Value  Fick Cardiac Output 6.97 L/min  Fick Cardiac Output Index 3.51 (L/min)/BSA  Aortic Mean Gradient 16.4 mmHg  Aortic Peak Gradient 18.6 mmHg  Aortic Valve Area 1.52  Aortic Value Area Index 0.77 cm2/BSA  RA A Wave 7 mmHg  RA V Wave 9 mmHg  RA Mean 7 mmHg  RV Systolic Pressure 39 mmHg  RV Diastolic Pressure 1 mmHg  RV EDP 7 mmHg  PA Systolic Pressure 41 mmHg  PA Diastolic Pressure 13 mmHg  PA Mean 24 mmHg  PW A Wave 13 mmHg  PW V Wave 26 mmHg  PW Mean 13 mmHg  AO Systolic Pressure 195 mmHg  AO Diastolic Pressure 42 mmHg  AO Mean 77 mmHg  LV Systolic Pressure 093 mmHg  LV Diastolic Pressure -1 mmHg  LV EDP 5 mmHg  AOp Systolic Pressure 267 mmHg  AOp Diastolic Pressure 43 mmHg  AOp Mean Pressure 76 mmHg  LVp Systolic Pressure 124 mmHg  LVp Diastolic Pressure -10 mmHg  LVp EDP Pressure 2 mmHg  QP/QS 1  TPVR Index 6.83 HRUI  TSVR Index 21.91 HRUI  PVR SVR Ratio 0.16  TPVR/TSVR Ratio 0.31       ADDENDUM REPORT: 04/21/2022 12:04   EXAM: OVER-READ INTERPRETATION  CT CHEST   The following report is an over-read performed by radiologist Dr. Maudry Diego [facility] on 04/21/2022. This over-read does not include interpretation of cardiac or coronary anatomy or pathology. The cardiac TAVR interpretation by the cardiologist is attached.   COMPARISON:  None.   FINDINGS: Extracardiac findings will be  described separately under dictation for contemporaneously obtained CTA chest, abdomen and pelvis.   IMPRESSION: Please see separate dictation for contemporaneously obtained CTA chest, abdomen and pelvis dated 04/21/2022 for full description of relevant extracardiac findings.     Electronically Signed   By: Yetta Glassman M.D.   On: 04/21/2022 12:04    Addended by Dena Billet, MD on 04/21/2022 12:07 PM    Study Result   Narrative & Impression  CLINICAL DATA:  Aortic Stenosis   EXAM: Cardiac TAVR CT   TECHNIQUE: The patient was scanned on a Siemens Force 128 slice scanner. A 120 kV retrospective scan was triggered in the ascending thoracic aorta at 140 HU's. Gantry rotation speed was 250 msecs and collimation was .6 mm. No beta blockade or nitro were given. The 3D data set was reconstructed in 5% intervals of the R-R cycle. Systolic and diastolic phases were analyzed on a dedicated work station using MPR, MIP and VRT modes. The patient received 80 cc of contrast.   FINDINGS: Aortic Valve: Tri leaflet calcified with restricted leaflet motion Score 3888   Aorta: Normal arch vessels no aneurysm severe calcific atherosclerosis   Sino-tubular Junction: 27 mm   Ascending Thoracic Aorta: 31 mm   Aortic Arch: 25 mm   Descending Thoracic Aorta: 24 mm   Sinus of Valsalva Measurements:   Non-coronary: 32.4 mm  Height 22 mm   Right - coronary: 33.3 mm Height 21.3 mm   Left -   coronary: 35.2 mm  Height 22.7 mm   Coronary Artery  Height above Annulus:   Left Main: 16.5 mm above annulus   Right Coronary: 17.3 mm above annulus   Virtual Basal Annulus Measurements:   Maximum / Minimum Diameter: 28.6 mm x 24.3 mm   Perimeter: 86.5 mm   Area: 574 mm 2 severe nodular protruding calcification at base of non coronary cusp   Coronary Arteries: Sufficient height above annulus for deployment Patent SVG RCA, Patent SVG to OM and Patent LIMA to LAD   Optimum Fluoroscopic Angle for Delivery: LAO 1 Caudal 25 degrees   Membranous septal length 6.04 mm   IMPRESSION: 1.  Calcified tri leaflet AV with score 3888   2. Annular area of 574 mm2 suitable for a 29 mm Sapien 3 valve Alternatively a 34 mm Evolut R valve can be considered   3. Coronary arteries sufficient height above annulus for deployment Patent SVG RCA, Patent SVG OM Patent LIMA to LAD   4.  Membranous septal length 6.04 mm   5. Optimum angiographic angle for deployment LAO 1 Caudal 25 degrees   Jenkins Rouge   Electronically Signed: By: Jenkins Rouge M.D. On: 04/21/2022 11:38          Narrative & Impression  CLINICAL DATA:  Preop evaluation for aortic valve replacement   EXAM: CT ANGIOGRAPHY CHEST, ABDOMEN AND PELVIS   TECHNIQUE: Non-contrast CT of the chest was initially obtained.   Multidetector CT imaging through the chest, abdomen and pelvis was performed using the standard protocol during bolus administration of intravenous contrast. Multiplanar reconstructed images and MIPs were obtained and reviewed to evaluate the vascular anatomy.   RADIATION DOSE REDUCTION: This exam was performed according to the departmental dose-optimization program which includes automated exposure control, adjustment of the mA and/or kV according to patient size and/or use of iterative reconstruction technique.   CONTRAST:  148m OMNIPAQUE IOHEXOL 350 MG/ML SOLN   COMPARISON:  None Available.   FINDINGS: CTA CHEST FINDINGS  Cardiovascular: Normal  heart size. No pericardial effusion. Caliber thoracic aorta with severe atherosclerotic disease. Severe left main and three-vessel coronary artery calcifications status post CABG. Aortic valve thickening and calcifications. Mitral annular calcifications.   Mediastinum/Nodes: Small hiatal hernia. Thyroid is unremarkable. Mildly enlarged mediastinal lymph nodes, likely reactive, reference subcarinal lymph node measuring 1.2 cm in short axis on series 6, image 89.   Lungs/Pleura: Central airways are patent. Biapical pleural-parenchymal scarring. Bilateral mosaic attenuation. No consolidation, pleural effusion or pneumothorax. Small solid pulmonary nodule of the left upper lobe measuring 3 mm on series 7 image 24.   Musculoskeletal: Prior median sternotomy with intact sternal wires. No acute or significant osseous findings.   CTA ABDOMEN AND PELVIS FINDINGS   Hepatobiliary: No focal liver abnormality is seen. No gallstones, gallbladder wall thickening, or biliary dilatation.   Pancreas: Unremarkable. No pancreatic ductal dilatation or surrounding inflammatory changes.   Spleen: Normal in size without focal abnormality.   Adrenals/Urinary Tract: Bilateral adrenal glands are unremarkable. No hydronephrosis or nephrolithiasis. Bilateral renal scarring. Bilateral low-attenuation renal lesions, largest are compatible with simple cysts, others are too small to completely characterize, no specific follow-up imaging recommended. No hydronephrosis or nephrolithiasis. Bladder is unremarkable.   Stomach/Bowel: Stomach is within normal limits. Appendix appears normal. Diverticulosis. No evidence of bowel wall thickening, distention, or inflammatory changes.   Vascular/lymphatic: Normal caliber abdominal aorta with severe atherosclerotic disease severe narrowing at the origin of the celiac artery and moderate narrowing at the origin of the SMA due to atherosclerotic disease. Two right and  single left renal arteries with moderate narrowing of the proximal vessels due to atherosclerotic disease. Moderate narrowing at the origin of the IMA due to atherosclerotic disease. No pathologically enlarged lymph nodes seen in the abdomen or pelvis.   Reproductive: Prostate is unremarkable.   Other: No abdominal wall hernia or abnormality. No abdominopelvic ascites.   Musculoskeletal: No acute or significant osseous findings.   VASCULAR MEASUREMENTS PERTINENT TO TAVR:   AORTA:   Minimal Aortic Diameter-12.9 mm   Severity of Aortic Calcification-severe   RIGHT PELVIS:   Right Common Iliac Artery -   Minimal Diameter-7.6 mm   Tortuosity-moderate   Calcification-severe   Right External Iliac Artery -   Minimal Diameter-5.9 mm   Tortuosity-mild   Calcification-moderate   Right Common Femoral Artery -   Minimal Diameter-5.1 mm   Tortuosity-none   Calcification-severe   LEFT PELVIS:   Left Common Iliac Artery -   Minimal Diameter-6.8 mm   Tortuosity-mild   Calcification-severe   Left External Iliac Artery -   Minimal Diameter-6.7 mm   Tortuosity-mild   Calcification-moderate   Left Common Femoral Artery -   Minimal Diameter-7.6 mm   Tortuosity-none   Calcification-severe   Review of the MIP images confirms the above findings.   IMPRESSION: 1. Vascular findings and measurements pertinent to potential TAVR procedure, as detailed above. 2. Thickening and calcification of the aortic valve, compatible with reported clinical history of aortic stenosis. 3. Severe aortoiliac atherosclerosis. 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 4. Small solid pulmonary nodule of the left upper lobe measuring 3 mm. No follow-up needed if patient is low-risk. Non-contrast chest CT can be considered in 12 months if patient is high-risk. This recommendation follows the consensus statement: Guidelines for Management of Incidental  Pulmonary Nodules Detected on CT Images: From the Fleischner Society 2017; Radiology 2017; 284:228-243.     Electronically Signed   By: Hosie Poisson.D.  On: 04/21/2022 12:01          Impression:   This 77 year old gentleman has stage D, severe, low-flow/low gradient aortic stenosis with NYHA class II-lll symptoms of exertional fatigue and shortness of breath consistent with chronic diastolic congestive heart failure.  He has also had episodes of dizziness, chest discomfort, and significant lower extremity edema.  I have personally reviewed his TEE, cardiac catheterization, and CTA studies.  TEE shows a severely calcified and thickened aortic valve with restricted leaflet mobility.  The aortic valve area by planimetry was 1.0 cm.  It certainly looks like a severely stenotic valve.  There is mild mitral valve stenosis and mild to moderate mitral regurgitation with a severely calcified anterior mitral leaflet.  Cardiac catheterization shows severe three-vessel coronary disease with occlusion of all 3 major vessels with 3/3 patent grafts.  The peak to peak gradient across the aortic valve was measured at 18.8 mmHg and the mean gradient 16.4 mmHg.  I agree that aortic valve replacement is indicated in this patient for relief of his progressively worsening symptoms of congestive heart failure and to prevent progressive left ventricular dysfunction.  Given his age and prior coronary bypass surgery I think that transcatheter aortic valve replacement would be the best treatment option for him.  His gated cardiac CTA shows anatomy suitable for TAVR using a 29 mm SAPIEN 3 valve.  His sinotubular junction was measured at 27.4 mmHg.  His abdominal and pelvic CTA shows adequate pelvic vascular anatomy to allow transfemoral insertion.  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  It may be necessary to implant his valve lowered due to the smaller sinotubular junction diameter  and he is at increased risk for needing a permanent pacemaker.   The patient and his wife were counseled at length regarding treatment alternatives for management of severe symptomatic aortic stenosis. The risks and benefits of surgical intervention has been discussed in detail. Long-term prognosis with medical therapy was discussed. Alternative approaches such as conventional surgical aortic valve replacement, transcatheter aortic valve replacement, and palliative medical therapy were compared and contrasted at length. This discussion was placed in the context of the patient's own specific clinical presentation and past medical history. All of their questions have been addressed.    Following the decision to proceed with transcatheter aortic valve replacement, a discussion was held regarding what types of management strategies would be attempted intraoperatively in the event of life-threatening complications, including whether or not the patient would be considered a candidate for the use of cardiopulmonary bypass and/or conversion to open sternotomy for attempted surgical intervention.  I do not think he is a candidate for emergent sternotomy to manage any intraoperative complications since he has already had coronary bypass surgery and radiation to his mediastinum.  The patient is aware of the fact that transient use of cardiopulmonary bypass may be necessary. The patient has been advised of a variety of complications that might develop including but not limited to risks of death, stroke, paravalvular leak, aortic dissection or other major vascular complications, aortic annulus rupture, device embolization, cardiac rupture or perforation, mitral regurgitation, acute myocardial infarction, arrhythmia, heart block or bradycardia requiring permanent pacemaker placement, congestive heart failure, respiratory failure, renal failure, pneumonia, infection, other late complications related to structural valve  deterioration or migration, or other complications that might ultimately cause a temporary or permanent loss of functional independence or other long term morbidity. The patient provides full informed consent for the procedure  as described and all questions were answered.       Plan:   Transfemoral TAVR using a SAPIEN 3 valve.       Gaye Pollack, MD

## 2022-06-02 ENCOUNTER — Encounter (HOSPITAL_COMMUNITY): Payer: Self-pay | Admitting: Cardiovascular Disease

## 2022-06-02 ENCOUNTER — Other Ambulatory Visit: Payer: Self-pay | Admitting: Cardiology

## 2022-06-02 ENCOUNTER — Inpatient Hospital Stay (HOSPITAL_COMMUNITY): Payer: Medicare HMO | Admitting: Physician Assistant

## 2022-06-02 ENCOUNTER — Inpatient Hospital Stay (HOSPITAL_COMMUNITY)
Admission: RE | Admit: 2022-06-02 | Discharge: 2022-06-04 | DRG: 267 | Disposition: A | Discharge status: Home / Self Care | Payer: Medicare HMO | Attending: Cardiovascular Disease | Admitting: Cardiovascular Disease

## 2022-06-02 ENCOUNTER — Inpatient Hospital Stay (HOSPITAL_COMMUNITY): Payer: Medicare HMO

## 2022-06-02 ENCOUNTER — Other Ambulatory Visit: Payer: Self-pay

## 2022-06-02 ENCOUNTER — Encounter (HOSPITAL_COMMUNITY)
Admission: RE | Disposition: A | Discharge status: Home / Self Care | Payer: Self-pay | Source: Home / Self Care | Attending: Cardiovascular Disease

## 2022-06-02 DIAGNOSIS — Z951 Presence of aortocoronary bypass graft: Secondary | ICD-10-CM | POA: Diagnosis not present

## 2022-06-02 DIAGNOSIS — Z87891 Personal history of nicotine dependence: Secondary | ICD-10-CM

## 2022-06-02 DIAGNOSIS — I350 Nonrheumatic aortic (valve) stenosis: Principal | ICD-10-CM | POA: Diagnosis present

## 2022-06-02 DIAGNOSIS — E11319 Type 2 diabetes mellitus with unspecified diabetic retinopathy without macular edema: Secondary | ICD-10-CM | POA: Diagnosis present

## 2022-06-02 DIAGNOSIS — Z923 Personal history of irradiation: Secondary | ICD-10-CM

## 2022-06-02 DIAGNOSIS — G43909 Migraine, unspecified, not intractable, without status migrainosus: Secondary | ICD-10-CM | POA: Diagnosis not present

## 2022-06-02 DIAGNOSIS — Z833 Family history of diabetes mellitus: Secondary | ICD-10-CM

## 2022-06-02 DIAGNOSIS — Z952 Presence of prosthetic heart valve: Secondary | ICD-10-CM

## 2022-06-02 DIAGNOSIS — I700 Atherosclerosis of aorta: Secondary | ICD-10-CM | POA: Diagnosis not present

## 2022-06-02 DIAGNOSIS — Z006 Encounter for examination for normal comparison and control in clinical research program: Secondary | ICD-10-CM

## 2022-06-02 DIAGNOSIS — R112 Nausea with vomiting, unspecified: Secondary | ICD-10-CM | POA: Diagnosis not present

## 2022-06-02 DIAGNOSIS — R001 Bradycardia, unspecified: Secondary | ICD-10-CM | POA: Diagnosis present

## 2022-06-02 DIAGNOSIS — I080 Rheumatic disorders of both mitral and aortic valves: Secondary | ICD-10-CM | POA: Diagnosis not present

## 2022-06-02 DIAGNOSIS — R42 Dizziness and giddiness: Secondary | ICD-10-CM | POA: Diagnosis not present

## 2022-06-02 DIAGNOSIS — Z79899 Other long term (current) drug therapy: Secondary | ICD-10-CM

## 2022-06-02 DIAGNOSIS — I050 Rheumatic mitral stenosis: Secondary | ICD-10-CM | POA: Diagnosis present

## 2022-06-02 DIAGNOSIS — I442 Atrioventricular block, complete: Secondary | ICD-10-CM | POA: Diagnosis not present

## 2022-06-02 DIAGNOSIS — E669 Obesity, unspecified: Secondary | ICD-10-CM | POA: Diagnosis not present

## 2022-06-02 DIAGNOSIS — I4891 Unspecified atrial fibrillation: Secondary | ICD-10-CM | POA: Diagnosis present

## 2022-06-02 DIAGNOSIS — Z8547 Personal history of malignant neoplasm of testis: Secondary | ICD-10-CM

## 2022-06-02 DIAGNOSIS — I4892 Unspecified atrial flutter: Secondary | ICD-10-CM | POA: Diagnosis not present

## 2022-06-02 DIAGNOSIS — Z8546 Personal history of malignant neoplasm of prostate: Secondary | ICD-10-CM | POA: Diagnosis not present

## 2022-06-02 DIAGNOSIS — Z888 Allergy status to other drugs, medicaments and biological substances status: Secondary | ICD-10-CM

## 2022-06-02 DIAGNOSIS — E119 Type 2 diabetes mellitus without complications: Secondary | ICD-10-CM

## 2022-06-02 DIAGNOSIS — I252 Old myocardial infarction: Secondary | ICD-10-CM | POA: Diagnosis not present

## 2022-06-02 DIAGNOSIS — Z7982 Long term (current) use of aspirin: Secondary | ICD-10-CM

## 2022-06-02 DIAGNOSIS — Z8249 Family history of ischemic heart disease and other diseases of the circulatory system: Secondary | ICD-10-CM | POA: Diagnosis not present

## 2022-06-02 DIAGNOSIS — I342 Nonrheumatic mitral (valve) stenosis: Secondary | ICD-10-CM | POA: Diagnosis present

## 2022-06-02 DIAGNOSIS — I2510 Atherosclerotic heart disease of native coronary artery without angina pectoris: Secondary | ICD-10-CM

## 2022-06-02 DIAGNOSIS — R030 Elevated blood-pressure reading, without diagnosis of hypertension: Secondary | ICD-10-CM | POA: Diagnosis not present

## 2022-06-02 DIAGNOSIS — Z794 Long term (current) use of insulin: Secondary | ICD-10-CM

## 2022-06-02 DIAGNOSIS — E785 Hyperlipidemia, unspecified: Secondary | ICD-10-CM | POA: Diagnosis not present

## 2022-06-02 DIAGNOSIS — Z91030 Bee allergy status: Secondary | ICD-10-CM | POA: Diagnosis not present

## 2022-06-02 DIAGNOSIS — Z885 Allergy status to narcotic agent status: Secondary | ICD-10-CM | POA: Diagnosis not present

## 2022-06-02 DIAGNOSIS — G8929 Other chronic pain: Secondary | ICD-10-CM | POA: Diagnosis present

## 2022-06-02 DIAGNOSIS — E1151 Type 2 diabetes mellitus with diabetic peripheral angiopathy without gangrene: Secondary | ICD-10-CM | POA: Diagnosis not present

## 2022-06-02 DIAGNOSIS — H409 Unspecified glaucoma: Secondary | ICD-10-CM | POA: Diagnosis present

## 2022-06-02 DIAGNOSIS — M5431 Sciatica, right side: Secondary | ICD-10-CM | POA: Diagnosis present

## 2022-06-02 DIAGNOSIS — I5032 Chronic diastolic (congestive) heart failure: Secondary | ICD-10-CM | POA: Diagnosis present

## 2022-06-02 DIAGNOSIS — E1165 Type 2 diabetes mellitus with hyperglycemia: Secondary | ICD-10-CM | POA: Diagnosis not present

## 2022-06-02 DIAGNOSIS — Z6832 Body mass index (BMI) 32.0-32.9, adult: Secondary | ICD-10-CM

## 2022-06-02 HISTORY — PX: TRANSCATHETER AORTIC VALVE REPLACEMENT, TRANSFEMORAL: SHX6400

## 2022-06-02 HISTORY — PX: INTRAOPERATIVE TRANSTHORACIC ECHOCARDIOGRAM: SHX6523

## 2022-06-02 HISTORY — DX: Presence of prosthetic heart valve: Z95.2

## 2022-06-02 LAB — ECHOCARDIOGRAM LIMITED
AR max vel: 2.26 cm2
AV Area VTI: 1.92 cm2
AV Area mean vel: 1.92 cm2
AV Mean grad: 3 mmHg
AV Peak grad: 4.2 mmHg
Ao pk vel: 1.02 m/s
MV VTI: 1.15 cm2

## 2022-06-02 LAB — POCT I-STAT, CHEM 8
BUN: 20 mg/dL (ref 8–23)
BUN: 19 mg/dL (ref 8–23)
BUN: 20 mg/dL (ref 8–23)
Calcium, Ion: 1.19 mmol/L (ref 1.15–1.40)
Calcium, Ion: 1.3 mmol/L (ref 1.15–1.40)
Calcium, Ion: 1.28 mmol/L (ref 1.15–1.40)
Chloride: 105 mmol/L (ref 98–111)
Chloride: 102 mmol/L (ref 98–111)
Chloride: 104 mmol/L (ref 98–111)
Creatinine, Ser: 1.1 mg/dL (ref 0.61–1.24)
Creatinine, Ser: 1 mg/dL (ref 0.61–1.24)
Creatinine, Ser: 1.1 mg/dL (ref 0.61–1.24)
Glucose, Bld: 259 mg/dL — ABNORMAL HIGH (ref 70–99)
Glucose, Bld: 237 mg/dL — ABNORMAL HIGH (ref 70–99)
Glucose, Bld: 198 mg/dL — ABNORMAL HIGH (ref 70–99)
HCT: 33 % — ABNORMAL LOW (ref 39.0–52.0)
HCT: 29 % — ABNORMAL LOW (ref 39.0–52.0)
HCT: 31 % — ABNORMAL LOW (ref 39.0–52.0)
Hemoglobin: 11.2 g/dL — ABNORMAL LOW (ref 13.0–17.0)
Hemoglobin: 9.9 g/dL — ABNORMAL LOW (ref 13.0–17.0)
Hemoglobin: 10.5 g/dL — ABNORMAL LOW (ref 13.0–17.0)
Potassium: 4.8 mmol/L (ref 3.5–5.1)
Potassium: 4.4 mmol/L (ref 3.5–5.1)
Potassium: 4.5 mmol/L (ref 3.5–5.1)
Sodium: 140 mmol/L (ref 135–145)
Sodium: 140 mmol/L (ref 135–145)
Sodium: 140 mmol/L (ref 135–145)
TCO2: 25 mmol/L (ref 22–32)
TCO2: 29 mmol/L (ref 22–32)
TCO2: 25 mmol/L (ref 22–32)

## 2022-06-02 LAB — GLUCOSE, CAPILLARY
Glucose-Capillary: 315 mg/dL — ABNORMAL HIGH (ref 70–99)
Glucose-Capillary: 290 mg/dL — ABNORMAL HIGH (ref 70–99)
Glucose-Capillary: 113 mg/dL — ABNORMAL HIGH (ref 70–99)
Glucose-Capillary: 156 mg/dL — ABNORMAL HIGH (ref 70–99)
Glucose-Capillary: 294 mg/dL — ABNORMAL HIGH (ref 70–99)

## 2022-06-02 SURGERY — IMPLANTATION, AORTIC VALVE, TRANSCATHETER, FEMORAL APPROACH
Anesthesia: Monitor Anesthesia Care | Site: Chest

## 2022-06-02 MED ORDER — SODIUM CHLORIDE 0.9 % IV SOLN: INTRAVENOUS | Status: DC

## 2022-06-02 MED ORDER — SODIUM CHLORIDE 0.9% FLUSH: 3.0000 mL | INTRAVENOUS | Status: DC | PRN

## 2022-06-02 MED ORDER — CHLORHEXIDINE GLUCONATE 0.12 % MT SOLN
OROMUCOSAL | Status: AC
  Administered 2022-06-02: 15 mL via OROMUCOSAL
  Filled 2022-06-02: qty 15

## 2022-06-02 MED ORDER — ORAL CARE MOUTH RINSE: 15.0000 mL | Freq: Once | OROMUCOSAL | Status: AC

## 2022-06-02 MED ORDER — LIDOCAINE HCL 1 % IJ SOLN
INTRAMUSCULAR | Status: DC | PRN
  Administered 2022-06-02: 16 mL via INTRADERMAL

## 2022-06-02 MED ORDER — LACTATED RINGERS IV SOLN: INTRAVENOUS | Status: DC | PRN

## 2022-06-02 MED ORDER — ATROPINE SULFATE 1 MG/10ML IJ SOSY
PREFILLED_SYRINGE | INTRAMUSCULAR | Status: AC
  Filled 2022-06-02: qty 10

## 2022-06-02 MED ORDER — HEPARIN 6000 UNIT IRRIGATION SOLUTION
Status: AC
  Filled 2022-06-02: qty 1500

## 2022-06-02 MED ORDER — HEPARIN SODIUM (PORCINE) 1000 UNIT/ML IJ SOLN
INTRAMUSCULAR | Status: DC | PRN
  Administered 2022-06-02: 11000 [IU] via INTRAVENOUS

## 2022-06-02 MED ORDER — FENTANYL CITRATE (PF) 250 MCG/5ML IJ SOLN
INTRAMUSCULAR | Status: DC | PRN
  Administered 2022-06-02: 50 ug via INTRAVENOUS

## 2022-06-02 MED ORDER — HEPARIN 6000 UNIT IRRIGATION SOLUTION
Status: DC | PRN
  Administered 2022-06-02: 3

## 2022-06-02 MED ORDER — DORZOLAMIDE HCL-TIMOLOL MAL 2-0.5 % OP SOLN
1.0000 [drp] | Freq: Two times a day (BID) | OPHTHALMIC | Status: DC
  Administered 2022-06-03 – 2022-06-04 (×3): 1 [drp] via OPHTHALMIC
  Filled 2022-06-02 (×2): qty 10

## 2022-06-02 MED ORDER — GLYCOPYRROLATE 0.2 MG/ML IJ SOLN
INTRAMUSCULAR | Status: DC | PRN
  Administered 2022-06-02: .2 mg via INTRAVENOUS

## 2022-06-02 MED ORDER — ONDANSETRON HCL 4 MG/2ML IJ SOLN
INTRAMUSCULAR | Status: AC
  Filled 2022-06-02: qty 2

## 2022-06-02 MED ORDER — INSULIN ASPART 100 UNIT/ML IJ SOLN
10.0000 [IU] | Freq: Once | INTRAMUSCULAR | Status: AC
  Administered 2022-06-02: 10 [IU] via SUBCUTANEOUS

## 2022-06-02 MED ORDER — OXYCODONE HCL 5 MG PO TABS
5.0000 mg | ORAL_TABLET | ORAL | Status: DC | PRN
  Administered 2022-06-03: 5 mg via ORAL
  Filled 2022-06-02: qty 1

## 2022-06-02 MED ORDER — LACTATED RINGERS IV SOLN: INTRAVENOUS | Status: DC

## 2022-06-02 MED ORDER — ASPIRIN 81 MG PO TBEC
81.0000 mg | DELAYED_RELEASE_TABLET | Freq: Every morning | ORAL | Status: DC
  Administered 2022-06-02 – 2022-06-03 (×2): 81 mg via ORAL
  Filled 2022-06-02 (×3): qty 1

## 2022-06-02 MED ORDER — LIDOCAINE HCL (PF) 1 % IJ SOLN
INTRAMUSCULAR | Status: AC
  Filled 2022-06-02: qty 30

## 2022-06-02 MED ORDER — CHLORHEXIDINE GLUCONATE 4 % EX LIQD: 30.0000 mL | CUTANEOUS | Status: DC

## 2022-06-02 MED ORDER — PROPOFOL 10 MG/ML IV BOLUS
INTRAVENOUS | Status: DC | PRN
  Administered 2022-06-02 (×2): 20 mg via INTRAVENOUS

## 2022-06-02 MED ORDER — IODIXANOL 320 MG/ML IV SOLN
INTRAVENOUS | Status: DC | PRN
  Administered 2022-06-02: 40 mL via INTRA_ARTERIAL

## 2022-06-02 MED ORDER — INSULIN ASPART 100 UNIT/ML IJ SOLN
INTRAMUSCULAR | Status: AC
  Filled 2022-06-02: qty 1

## 2022-06-02 MED ORDER — PROPOFOL 500 MG/50ML IV EMUL
INTRAVENOUS | Status: DC | PRN
  Administered 2022-06-02: 20 ug/kg/min via INTRAVENOUS

## 2022-06-02 MED ORDER — INSULIN ASPART 100 UNIT/ML IJ SOLN
0.0000 [IU] | INTRAMUSCULAR | Status: DC
  Administered 2022-06-02: 12 [IU] via SUBCUTANEOUS

## 2022-06-02 MED ORDER — PROPOFOL 10 MG/ML IV BOLUS
INTRAVENOUS | Status: AC
  Filled 2022-06-02: qty 20

## 2022-06-02 MED ORDER — FENTANYL CITRATE (PF) 250 MCG/5ML IJ SOLN
INTRAMUSCULAR | Status: AC
  Filled 2022-06-02: qty 5

## 2022-06-02 MED ORDER — INSULIN ASPART 100 UNIT/ML IJ SOLN
INTRAMUSCULAR | Status: DC | PRN
  Administered 2022-06-02: 3 [IU] via SUBCUTANEOUS

## 2022-06-02 MED ORDER — SODIUM CHLORIDE 0.9 % IV SOLN: 250.0000 mL | INTRAVENOUS | Status: DC

## 2022-06-02 MED ORDER — SODIUM CHLORIDE 0.9 % IV SOLN: 250.0000 mL | INTRAVENOUS | Status: DC | PRN

## 2022-06-02 MED ORDER — SODIUM CHLORIDE 0.9% FLUSH: 3.0000 mL | Freq: Two times a day (BID) | INTRAVENOUS | Status: DC

## 2022-06-02 MED ORDER — PROTAMINE SULFATE 10 MG/ML IV SOLN
INTRAVENOUS | Status: DC | PRN
  Administered 2022-06-02: 110 mg via INTRAVENOUS

## 2022-06-02 MED ORDER — NITROGLYCERIN IN D5W 200-5 MCG/ML-% IV SOLN
0.0000 ug/min | INTRAVENOUS | Status: DC
  Filled 2022-06-02: qty 250

## 2022-06-02 MED ORDER — CHLORHEXIDINE GLUCONATE 4 % EX LIQD: 60.0000 mL | Freq: Once | CUTANEOUS | Status: DC

## 2022-06-02 MED ORDER — ONDANSETRON HCL 4 MG/2ML IJ SOLN
INTRAMUSCULAR | Status: DC | PRN
  Administered 2022-06-02: 4 mg via INTRAVENOUS

## 2022-06-02 MED ORDER — SODIUM CHLORIDE 0.9 % IV SOLN: INTRAVENOUS | Status: AC

## 2022-06-02 MED ORDER — CEFAZOLIN SODIUM-DEXTROSE 2-4 GM/100ML-% IV SOLN
2.0000 g | Freq: Three times a day (TID) | INTRAVENOUS | Status: AC
  Administered 2022-06-02 – 2022-06-03 (×3): 2 g via INTRAVENOUS
  Filled 2022-06-02 (×2): qty 100

## 2022-06-02 MED ORDER — CHLORHEXIDINE GLUCONATE 0.12 % MT SOLN: 15.0000 mL | Freq: Once | OROMUCOSAL | Status: DC

## 2022-06-02 MED ORDER — ACETAMINOPHEN 325 MG PO TABS
650.0000 mg | ORAL_TABLET | Freq: Four times a day (QID) | ORAL | Status: DC | PRN
  Administered 2022-06-02: 650 mg via ORAL
  Filled 2022-06-02: qty 2

## 2022-06-02 MED ORDER — ONDANSETRON HCL 4 MG/2ML IJ SOLN
4.0000 mg | Freq: Four times a day (QID) | INTRAMUSCULAR | Status: DC | PRN
  Administered 2022-06-02 – 2022-06-03 (×2): 4 mg via INTRAVENOUS
  Filled 2022-06-02 (×2): qty 2

## 2022-06-02 MED ORDER — ACETAMINOPHEN 650 MG RE SUPP: 650.0000 mg | Freq: Four times a day (QID) | RECTAL | Status: DC | PRN

## 2022-06-02 MED ORDER — CHLORHEXIDINE GLUCONATE 0.12 % MT SOLN: 15.0000 mL | Freq: Once | OROMUCOSAL | Status: AC

## 2022-06-02 SURGICAL SUPPLY — 64 items
ADH SKN CLS APL DERMABOND .7 (GAUZE/BANDAGES/DRESSINGS) ×2
APL PRP STRL LF DISP 70% ISPRP (MISCELLANEOUS) ×2
BAG COUNTER SPONGE SURGICOUNT (BAG) ×2 IMPLANT
BAG DECANTER FOR FLEXI CONT (MISCELLANEOUS) IMPLANT
BAG SPNG CNTER NS LX DISP (BAG) ×2
BLADE CLIPPER SURG (BLADE) IMPLANT
BLADE OSCILLATING /SAGITTAL (BLADE) IMPLANT
BLADE STERNUM SYSTEM 6 (BLADE) IMPLANT
CABLE ADAPT CONN TEMP 6FT (ADAPTER) ×2 IMPLANT
CATH DIAG EXPO 6F AL1 (CATHETERS) IMPLANT
CATH DIAG EXPO 6F VENT PIG 145 (CATHETERS) ×4 IMPLANT
CATH INFINITI 6F AL2 (CATHETERS) IMPLANT
CATH S G BIP PACING (CATHETERS) ×2 IMPLANT
CHLORAPREP W/TINT 26 (MISCELLANEOUS) ×2 IMPLANT
CLOSURE MYNX CONTROL 6F/7F (Vascular Products) IMPLANT
CNTNR URN SCR LID CUP LEK RST (MISCELLANEOUS) ×4 IMPLANT
CONT SPEC 4OZ STRL OR WHT (MISCELLANEOUS) ×4
COVER BACK TABLE 80X110 HD (DRAPES) ×2 IMPLANT
DERMABOND ADVANCED .7 DNX12 (GAUZE/BANDAGES/DRESSINGS) ×2 IMPLANT
DEVICE CLOSURE PERCLS PRGLD 6F (VASCULAR PRODUCTS) ×4 IMPLANT
DRSG TEGADERM 4X4.75 (GAUZE/BANDAGES/DRESSINGS) ×4 IMPLANT
ELECT REM PT RETURN 9FT ADLT (ELECTROSURGICAL) ×2
ELECTRODE REM PT RTRN 9FT ADLT (ELECTROSURGICAL) ×2 IMPLANT
GAUZE SPONGE 4X4 12PLY STRL (GAUZE/BANDAGES/DRESSINGS) ×2 IMPLANT
GAUZE SPONGE 4X4 12PLY STRL LF (GAUZE/BANDAGES/DRESSINGS) IMPLANT
GLOVE BIO SURGEON STRL SZ7.5 (GLOVE) ×2 IMPLANT
GLOVE ECLIPSE 7.0 STRL STRAW (GLOVE) ×2 IMPLANT
GOWN STRL REUS W/ TWL LRG LVL3 (GOWN DISPOSABLE) IMPLANT
GOWN STRL REUS W/ TWL XL LVL3 (GOWN DISPOSABLE) ×2 IMPLANT
GOWN STRL REUS W/TWL LRG LVL3 (GOWN DISPOSABLE)
GOWN STRL REUS W/TWL XL LVL3 (GOWN DISPOSABLE) ×2
KIT BASIN OR (CUSTOM PROCEDURE TRAY) ×2 IMPLANT
KIT HEART LEFT (KITS) ×2 IMPLANT
KIT MICROPUNCTURE NIT STIFF (SHEATH) IMPLANT
KIT NAMIC PS PRESSURIZED FLUID (KITS) IMPLANT
KIT SAPIAN 3 ULTRA RESILIA 29 (Valve) IMPLANT
KIT TURNOVER KIT B (KITS) ×2 IMPLANT
NS IRRIG 1000ML POUR BTL (IV SOLUTION) ×2 IMPLANT
PACK ENDO MINOR (CUSTOM PROCEDURE TRAY) ×2 IMPLANT
PAD ARMBOARD 7.5X6 YLW CONV (MISCELLANEOUS) ×4 IMPLANT
PAD ELECT DEFIB RADIOL ZOLL (MISCELLANEOUS) ×2 IMPLANT
PERCLOSE PROGLIDE 6F (VASCULAR PRODUCTS) ×4
POSITIONER HEAD DONUT 9IN (MISCELLANEOUS) ×2 IMPLANT
SET MICROPUNCTURE 5F STIFF (MISCELLANEOUS) ×2 IMPLANT
SHEATH BRITE TIP 7FR 35CM (SHEATH) ×2 IMPLANT
SHEATH PINNACLE 6F 10CM (SHEATH) ×2 IMPLANT
SHEATH PINNACLE 8F 10CM (SHEATH) ×2 IMPLANT
SLEEVE REPOSITIONING LENGTH 30 (MISCELLANEOUS) ×2 IMPLANT
SPIKE FLUID TRANSFER (MISCELLANEOUS) ×2 IMPLANT
SPONGE T-LAP 18X18 ~~LOC~~+RFID (SPONGE) ×2 IMPLANT
STOPCOCK MORSE 400PSI 3WAY (MISCELLANEOUS) ×4 IMPLANT
SUT PROLENE 6 0 C 1 30 (SUTURE) IMPLANT
SUT SILK  1 MH (SUTURE) ×2
SUT SILK 1 MH (SUTURE) ×2 IMPLANT
SYR 50ML LL SCALE MARK (SYRINGE) ×2 IMPLANT
SYR BULB IRRIG 60ML STRL (SYRINGE) IMPLANT
TOWEL GREEN STERILE (TOWEL DISPOSABLE) ×4 IMPLANT
TRANSDUCER W/STOPCOCK (MISCELLANEOUS) ×4 IMPLANT
TUBING CIL FLEX 10 FLL-RA (TUBING) IMPLANT
WIRE AMPLATZ SS-J .035X180CM (WIRE) IMPLANT
WIRE EMERALD 3MM-J .035X150CM (WIRE) ×2 IMPLANT
WIRE EMERALD 3MM-J .035X260CM (WIRE) ×2 IMPLANT
WIRE EMERALD ST .035X260CM (WIRE) ×4 IMPLANT
WIRE SAFARI SM CURVE 275 (WIRE) IMPLANT

## 2022-06-02 NOTE — Anesthesia Preprocedure Evaluation (Addendum)
Anesthesia Evaluation  Patient identified by MRN, date of birth, ID band Patient awake    Reviewed: Allergy & Precautions, H&P , NPO status , Patient's Chart, lab work & pertinent test results  Airway Mallampati: II  TM Distance: >3 FB Neck ROM: Full    Dental no notable dental hx.    Pulmonary former smoker   Pulmonary exam normal breath sounds clear to auscultation       Cardiovascular + CAD, + Past MI and + CABG  + Valvular Problems/Murmurs AS  Rhythm:Regular Rate:Normal + Systolic murmurs    Neuro/Psych negative neurological ROS  negative psych ROS   GI/Hepatic negative GI ROS, Neg liver ROS,,,  Endo/Other  diabetes, Poorly Controlled, Type 2    Renal/GU negative Renal ROS  negative genitourinary   Musculoskeletal negative musculoskeletal ROS (+)    Abdominal   Peds negative pediatric ROS (+)  Hematology negative hematology ROS (+)   Anesthesia Other Findings   Reproductive/Obstetrics negative OB ROS                             Anesthesia Physical Anesthesia Plan  ASA: 3  Anesthesia Plan: MAC   Post-op Pain Management: Minimal or no pain anticipated   Induction: Intravenous  PONV Risk Score and Plan: 1 and Propofol infusion and Treatment may vary due to age or medical condition  Airway Management Planned: Simple Face Mask  Additional Equipment: Arterial line  Intra-op Plan:   Post-operative Plan:   Informed Consent: I have reviewed the patients History and Physical, chart, labs and discussed the procedure including the risks, benefits and alternatives for the proposed anesthesia with the patient or authorized representative who has indicated his/her understanding and acceptance.     Dental advisory given  Plan Discussed with: CRNA and Surgeon  Anesthesia Plan Comments:        Anesthesia Quick Evaluation

## 2022-06-02 NOTE — Interval H&P Note (Signed)
History and Physical Interval Note:  06/02/2022 6:38 AM  Matthew [name]  has presented today for surgery, with the diagnosis of Severe Aortic Stenosis.  The various methods of treatment have been discussed with the patient and family. After consideration of risks, benefits and other options for treatment, the patient has consented to  Procedure(s): Transcatheter Aortic Valve Replacement, Transfemoral (N/A) INTRAOPERATIVE TRANSTHORACIC ECHOCARDIOGRAM (N/A) as a surgical intervention.  The patient's history has been reviewed, patient examined, no change in status, stable for surgery.  I have reviewed the patient's chart and labs.  Questions were answered to the patient's satisfaction.     Gaye Pollack

## 2022-06-02 NOTE — Progress Notes (Signed)
Notified Dr. Ermalene Postin of pt's sugar of  315 this am. Order for NOvolog insulin placed and administerd. Also notified him that pt has Desxcom on Right arm. Per Dr. Ermalene Postin. Ok to leave on .

## 2022-06-02 NOTE — Progress Notes (Signed)
Mobility Specialist Progress Note:   06/02/22 1453  Mobility  Activity Ambulated with assistance in hallway  Level of Assistance Standby assist, set-up cues, supervision of patient - no hands on  Assistive Device Front wheel walker  Distance Ambulated (ft) 250 ft  Activity Response Tolerated well  $Mobility charge 1 Mobility   Pre- Mobility: 62 HR; 120/56 (76) BP;  100% SpO2 Post Mobility:  60 HR; 119/56 (73) BP; 100% SpO2  Pt in bed willing to participate in mobility. No complaints of pain. Left in bed with call bell in reach and all needs met.   Matthew [name] Mobility Specialist Please contact via [employer] or  Rehab Office at [phone]

## 2022-06-02 NOTE — Op Note (Signed)
[facility]   MULTIDISCIPLINARY HEART VALVE TEAM   TAVR OPERATIVE NOTE   Date of Procedure:  06/02/2022  Preoperative Diagnosis: Severe Aortic Stenosis   Postoperative Diagnosis: Same   Procedure:   Transcatheter Aortic Valve Replacement - Percutaneous Right Transfemoral Approach  Edwards Sapien 3 Ultra Resilia THV (size 29 mm, model # [id number], serial # [id number])   Co-Surgeons:  Gaye Pollack, MD and Lauree Chandler, MD   Anesthesiologist:  G. Kalman Shan, MD  Echocardiographer:  Jerilynn Mages. Croitoru, MD  Pre-operative Echo Findings: Severe aortic stenosis Normal left ventricular systolic function  Post-operative Echo Findings: No paravalvular leak Normal left ventricular systolic function   BRIEF CLINICAL NOTE AND INDICATIONS FOR SURGERY   This 77 year old gentleman has stage D, severe, low-flow/low gradient aortic stenosis with NYHA class II-lll symptoms of exertional fatigue and shortness of breath consistent with chronic diastolic congestive heart failure.  He has also had episodes of dizziness, chest discomfort, and significant lower extremity edema.  I have personally reviewed his TEE, cardiac catheterization, and CTA studies.  TEE shows a severely calcified and thickened aortic valve with restricted leaflet mobility.  The aortic valve area by planimetry was 1.0 cm.  It certainly looks like a severely stenotic valve.  There is mild mitral valve stenosis and mild to moderate mitral regurgitation with a severely calcified anterior mitral leaflet.  Cardiac catheterization shows severe three-vessel coronary disease with occlusion of all 3 major vessels with 3/3 patent grafts.  The peak to peak gradient across the aortic valve was measured at 18.8 mmHg and the mean gradient 16.4 mmHg.  I agree that aortic valve replacement is indicated in this patient for relief of his progressively worsening symptoms of congestive heart failure and to prevent progressive left ventricular  dysfunction.  Given his age and prior coronary bypass surgery I think that transcatheter aortic valve replacement would be the best treatment option for him.  His gated cardiac CTA shows anatomy suitable for TAVR using a 29 mm SAPIEN 3 valve.  His sinotubular junction was measured at 27.4 mmHg.  His abdominal and pelvic CTA shows adequate pelvic vascular anatomy to allow transfemoral insertion.  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  It may be necessary to implant his valve lowered due to the smaller sinotubular junction diameter and he is at increased risk for needing a permanent pacemaker.   The patient and his wife were counseled at length regarding treatment alternatives for management of severe symptomatic aortic stenosis. The risks and benefits of surgical intervention has been discussed in detail. Long-term prognosis with medical therapy was discussed. Alternative approaches such as conventional surgical aortic valve replacement, transcatheter aortic valve replacement, and palliative medical therapy were compared and contrasted at length. This discussion was placed in the context of the patient's own specific clinical presentation and past medical history. All of their questions have been addressed.    Following the decision to proceed with transcatheter aortic valve replacement, a discussion was held regarding what types of management strategies would be attempted intraoperatively in the event of life-threatening complications, including whether or not the patient would be considered a candidate for the use of cardiopulmonary bypass and/or conversion to open sternotomy for attempted surgical intervention.  I do not think he is a candidate for emergent sternotomy to manage any intraoperative complications since he has already had coronary bypass surgery and radiation to his mediastinum.  The patient is aware of the fact that transient use of  cardiopulmonary bypass  may be necessary. The patient has been advised of a variety of complications that might develop including but not limited to risks of death, stroke, paravalvular leak, aortic dissection or other major vascular complications, aortic annulus rupture, device embolization, cardiac rupture or perforation, mitral regurgitation, acute myocardial infarction, arrhythmia, heart block or bradycardia requiring permanent pacemaker placement, congestive heart failure, respiratory failure, renal failure, pneumonia, infection, other late complications related to structural valve deterioration or migration, or other complications that might ultimately cause a temporary or permanent loss of functional independence or other long term morbidity. The patient provides full informed consent for the procedure as described and all questions were answered.        DETAILS OF THE OPERATIVE PROCEDURE  PREPARATION:    The patient was brought to the operating room on the above mentioned date and appropriate monitoring was established by the anesthesia team. The patient was placed in the supine position on the operating table.  Intravenous antibiotics were administered. The patient was monitored closely throughout the procedure under conscious sedation.   Baseline transthoracic echocardiogram was performed. The patient's abdomen and both groins were prepped and draped in a sterile manner. A time out procedure was performed.   PERIPHERAL ACCESS:    Using the modified Seldinger technique, femoral arterial and venous access was obtained with placement of 6 Fr sheaths on the left side.  A pigtail diagnostic catheter was passed through the left arterial sheath under fluoroscopic guidance into the aortic root.  A temporary transvenous pacemaker catheter was passed through the left femoral venous sheath under fluoroscopic guidance into the right ventricle.  The pacemaker was tested to ensure stable lead placement and pacemaker capture.  Aortic root angiography was performed in order to determine the optimal angiographic angle for valve deployment.   TRANSFEMORAL ACCESS:   Percutaneous transfemoral access and sheath placement was performed using ultrasound guidance.  The right common femoral artery was cannulated using a micropuncture needle and appropriate location was verified using hand injection angiogram.  A pair of Abbott Perclose percutaneous closure devices were placed and a 6 French sheath replaced into the femoral artery.  The patient was heparinized systemically and ACT verified > 250 seconds.    A 16 Fr transfemoral E-sheath was introduced into the right common femoral artery after progressively dilating over an Amplatz superstiff wire. An AL-2 catheter was used to direct a straight-tip exchange length wire across the native aortic valve into the left ventricle. This was exchanged out for a pigtail catheter and position was confirmed in the LV apex. Simultaneous LV and Ao pressures were recorded.  The pigtail catheter was exchanged for a Safari wire in the LV apex.   BALLOON AORTIC VALVULOPLASTY:   Not performed   TRANSCATHETER HEART VALVE DEPLOYMENT:   An Edwards Sapien 3 Ultra transcatheter heart valve (size 29 mm) was prepared and crimped per manufacturer's guidelines, and the proper orientation of the valve is confirmed on the [employer] delivery system. The valve was advanced through the introducer sheath using normal technique until in an appropriate position in the abdominal aorta beyond the sheath tip. The balloon was then retracted and using the fine-tuning wheel was centered on the valve. The valve was then advanced across the aortic arch using appropriate flexion of the catheter. The valve was carefully positioned across the aortic valve annulus. The Commander catheter was retracted using normal technique. Once final position of the valve has been confirmed by angiographic assessment, the valve is  deployed  during rapid ventricular pacing to maintain systolic blood pressure < 50 mmHg and pulse pressure < 10 mmHg. The balloon inflation is held for >3 seconds after reaching full deployment volume. Once the balloon has fully deflated the balloon is retracted into the ascending aorta and valve function is assessed using echocardiography. There is felt to be no paravalvular leak and no central aortic insufficiency.  The patient's hemodynamic recovery following valve deployment is good.  The deployment balloon and guidewire are both removed.    PROCEDURE COMPLETION:   The sheath was removed and femoral artery closure performed.  Protamine was administered once femoral arterial repair was complete. The temporary pacemaker, pigtail catheter and femoral sheaths were removed with manual pressure used for venous hemostasis.  A Mynx femoral closure device was utilized following removal of the diagnostic sheath in the left femoral artery.  The patient tolerated the procedure well and is transported to the cath lab recovery area in stable condition. There were no immediate intraoperative complications. All sponge instrument and needle counts are verified correct at completion of the operation.   No blood products were administered during the operation.  The patient received a total of 40 mL of intravenous contrast during the procedure.   Gaye Pollack, MD 06/02/2022

## 2022-06-02 NOTE — CV Procedure (Signed)
[facility]  TAVR OPERATIVE NOTE   Date of Procedure:  06/02/2022  Preoperative Diagnosis: Severe Aortic Stenosis   Postoperative Diagnosis: Same   Procedure:   Transcatheter Aortic Valve Replacement - Transfemoral Approach  Edwards Sapien 3 THV (size 29 mm, model # [id number], serial # [id number])   Co-Surgeons:  Lauree Chandler, MD and Gilford Raid , MD   Anesthesiologist:  Kalman Shan  Echocardiographer:  Croitoru  Pre-operative Echo Findings: Severe aortic stenosis Normal left ventricular systolic function  Post-operative Echo Findings: No paravalvular leak Normal left ventricular systolic function  BRIEF CLINICAL NOTE AND INDICATIONS FOR SURGERY  77 yo male with history of CAD s/p 3V CABG, hyperlipidemia, atrial flutter, mitral regurgitation, diabetes mellitus and aortic stenosis who is here today for TAVR. He is followed in our office by Dr. Lennie Odor. He has undergone atrial flutter ablation x 3. He has CAD and has undergone 3V CABG in 2011. 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 He has had more dizziness and fatigue lately leading to an echo on 11/10/21 with LVEF=55-60%. Mitral stenosis. Moderately severe aortic stenosis with mean gradient of 26 mmHg, DI 0.19, SVI 30. AVA 0.8 cm2. TEE 03/13/22 per Dr. Sallyanne Kuster with mild mitral stenosis. Moderate to severe aortic stenosis.    During the course of the patient's preoperative work up they have been evaluated comprehensively by a multidisciplinary team of specialists coordinated through the [facility] in the [facility].  They have been demonstrated to suffer from symptomatic severe aortic stenosis as noted above. The patient has been counseled extensively as to the relative risks and benefits of all options for the treatment of severe aortic stenosis including long term medical therapy, conventional surgery for aortic valve  replacement, and transcatheter aortic valve replacement.  The patient has been independently evaluated by Dr. Cyndia Bent with CT surgery and they are felt to be at high risk for conventional surgical aortic valve replacement. The surgeon indicated the patient would be a poor candidate for conventional surgery. Based upon review of all of the patient's preoperative diagnostic tests they are felt to be candidate for transcatheter aortic valve replacement using the transfemoral approach as an alternative to high risk conventional surgery.    Following the decision to proceed with transcatheter aortic valve replacement, a discussion has been held regarding what types of management strategies would be attempted intraoperatively in the event of life-threatening complications, including whether or not the patient would be considered a candidate for the use of cardiopulmonary bypass and/or conversion to open sternotomy for attempted surgical intervention.  The patient has been advised of a variety of complications that might develop peculiar to this approach including but not limited to risks of death, stroke, paravalvular leak, aortic dissection or other major vascular complications, aortic annulus rupture, device embolization, cardiac rupture or perforation, acute myocardial infarction, arrhythmia, heart block or bradycardia requiring permanent pacemaker placement, congestive heart failure, respiratory failure, renal failure, pneumonia, infection, other late complications related to structural valve deterioration or migration, or other complications that might ultimately cause a temporary or permanent loss of functional independence or other long term morbidity.  The patient provides full informed consent for the procedure as described and all questions were answered preoperatively.    DETAILS OF THE OPERATIVE PROCEDURE  PREPARATION:   The patient is brought to the operating room on the above mentioned date and  central monitoring was established by the anesthesia team including  placement of a radial arterial line. The patient is placed in the supine position on the operating table.  Intravenous antibiotics are administered. Conscious sedation is used.   Baseline transthoracic echocardiogram was performed. The patient's chest, abdomen, both groins, and both lower extremities are prepared and draped in a sterile manner. A time out procedure is performed.   PERIPHERAL ACCESS:   Using the modified Seldinger technique, femoral arterial and venous access were obtained with placement of a 6 Fr sheath in the artery and a 7 Fr sheath in the vein on the left side using u/s guidance.  A pigtail diagnostic catheter was passed through the femoral arterial sheath under fluoroscopic guidance into the aortic root.  A temporary transvenous pacemaker catheter was passed through the femoral venous sheath under fluoroscopic guidance into the right ventricle.  The pacemaker was tested to ensure stable lead placement and pacemaker capture. Aortic root angiography was performed in order to determine the optimal angiographic angle for valve deployment.  TRANSFEMORAL ACCESS:  A micropuncture kit was used to gain access to the right femoral artery using u/s guidance. Position confirmed with angiography. Pre-closure with double ProGlide closure devices. The patient was heparinized systemically and ACT verified > 250 seconds.    A 16 Fr transfemoral E-sheath was introduced into the right femoral artery after progressively dilating over an Amplatz superstiff wire. An AL-2 catheter was used to direct a straight-tip exchange length wire across the native aortic valve into the left ventricle. This was exchanged out for a pigtail catheter and position was confirmed in the LV apex. Simultaneous LV and Ao pressures were recorded.  The pigtail catheter was then exchanged for a Safari wire in the LV apex.   TRANSCATHETER HEART VALVE DEPLOYMENT:   An Edwards Sapien 3 THV (size 29 mm) was prepared and crimped per manufacturer's guidelines, and the proper orientation of the valve is confirmed on the [employer] delivery system. The valve was advanced through the introducer sheath using normal technique until in an appropriate position in the abdominal aorta beyond the sheath tip. The balloon was then retracted and using the fine-tuning wheel was centered on the valve. The valve was then advanced across the aortic arch using appropriate flexion of the catheter. The valve was carefully positioned across the aortic valve annulus. The Commander catheter was retracted using normal technique. Once final position of the valve has been confirmed by angiographic assessment, the valve is deployed while temporarily holding ventilation and during rapid ventricular pacing to maintain systolic blood pressure < 50 mmHg and pulse pressure < 10 mmHg. The balloon inflation is held for >3 seconds after reaching full deployment volume. Once the balloon has fully deflated the balloon is retracted into the ascending aorta and valve function is assessed using TTE. There is felt to be no paravalvular leak and no central aortic insufficiency.  The patient's hemodynamic recovery following valve deployment is good.  The deployment balloon and guidewire are both removed. Echo demostrated acceptable post-procedural gradients, stable mitral valve function, and no AI.   PROCEDURE COMPLETION:  The sheath was then removed and closure devices were completed. Protamine was administered once femoral arterial repair was complete. The temporary pacemaker, pigtail catheters and femoral sheaths were removed with a Mynx closure device placed in the artery and manual pressure used for venous hemostasis.    The patient tolerated the procedure well and is transported to the surgical intensive care in stable condition. There were no immediate intraoperative complications. All sponge  instrument and  needle counts are verified correct at completion of the operation.   No blood products were administered during the operation.  The patient received a total of 40 mL of intravenous contrast during the procedure.  LVEDP: 20 mmHg  Lauree Chandler MD, [facility] 06/02/2022 9:22 AM

## 2022-06-02 NOTE — Progress Notes (Signed)
  [place] VALVE TEAM  Patient doing well s/p TAVR. He is hemodynamically stable. Groin sites stable. ECG with no high grade block. Plan to DC arterial line and transfer to 4E.  Plan for early ambulation after bedrest completed and hopeful discharge over the next 24-48 hours.   Kathyrn Drown NP-C Structural Heart Team  Pager: [phone] Phone: [phone]

## 2022-06-02 NOTE — Discharge Summary (Incomplete)
Matthew [name] VALVE TEAM  Discharge Summary    Patient ID: Matthew [name] MRN: [id number]; DOB: 20-Sep-1945  Admit date: 06/02/2022 Discharge date: 06/04/2022  Primary Care Provider: Wendie Agreste, MD  Primary Cardiologist: Matthew Meredith Leeds, MD / Dr. Angelena Form, MD and Dr. Cyndia Bent, MD (TAVR)  Discharge Diagnoses    Principal Problem:   S/P TAVR (transcatheter aortic valve replacement) Active Problems:   Coronary atherosclerosis of native coronary artery   Diabetes mellitus, type 2 (HCC)   Hyperlipidemia   Paroxysmal atrial flutter (HCC)   Mitral valve stenosis, non-rheumatic   Aortic stenosis   Severe aortic stenosis   Heart block AV complete (HCC)   Nausea & vomiting  Allergies Allergies  Allergen Reactions   Actos [Pioglitazone] Swelling   Bee Venom Swelling   Codeine Hives and Nausea Only   Metformin Nausea Only   Statins Other (See Comments)    Muscle pain, fatigue   Glucophage [Metformin Hcl] Nausea Only   Raspberry Itching    Diagnostic Studies/Procedures     PPM 06/03/22:  CONCLUSIONS:   1. Successful implantation of a Abbott Assurity [id number] dual-chamber pacemaker for symptomatic bradycardia  2. No early apparent complications.      [facility]  TAVR OPERATIVE NOTE     Date of Procedure:                06/02/2022   Preoperative Diagnosis:      Severe Aortic Stenosis    Postoperative Diagnosis:    Same    Procedure:        Transcatheter Aortic Valve Replacement - Transfemoral Approach             Edwards Sapien 3 THV (size 29 mm, model # [id number], serial # [id number])              Co-Surgeons:                        Matthew Chandler, MD and Matthew [name] , MD    Anesthesiologist:                  Matthew [name]   Echocardiographer:              Matthew [name]   Pre-operative Echo Findings: Severe aortic stenosis Normal left ventricular systolic function   Post-operative Echo Findings: No  paravalvular leak Normal left ventricular systolic function  _____________   Echo 06/03/22:    1. Left ventricular ejection fraction, by estimation, is 50 to 55%. The  left ventricle has low normal function. The left ventricle has no regional  wall motion abnormalities. There is mild concentric left ventricular  hypertrophy. Left ventricular  diastolic function could not be evaluated.   2. Right ventricular systolic function is normal. The right ventricular  size is normal. Tricuspid regurgitation signal is inadequate for assessing  PA pressure.   3. Left atrial size was severely dilated.   4. Right atrial size was mildly dilated.   5. The mitral valve is degenerative. Trivial mitral valve regurgitation.  Mild mitral stenosis. The mean mitral valve gradient is 7.0 mmHg with  average heart rate of 99 bpm. Severe mitral annular calcification.   6. The aortic valve has been repaired/replaced. Aortic valve  regurgitation is not visualized. There is a 29 mm Sapien prosthetic (TAVR)  valve present in the aortic position. Procedure Date: 06/02/2022. Aortic  valve mean gradient measures 9.0 mmHg. Aortic  valve Vmax measures 1.92 m/s. Aortic valve acceleration time measures 74  msec.   7. The inferior vena cava is normal in size with greater than 50%  respiratory variability, suggesting right atrial pressure of 3 mmHg.   History of Present Illness     Matthew [name] is a 77 y.o. male with a history of CAD s/p 3V CABG (2011), obesity, testicular cancer s/p surgery and radiation, HLD, atrial fib/flutter s/p ablation x3, mitral regurgitation/stenosis, IDDM, and severe aortic stenosis who presented to [facility] on 06/02/22 for planned TAVR.   Matthew [name] has been followed by Dr. Curt [name] for his cardiology care. He was referred to the structural heart team for evaluation of severe aortic stenosis 02/2022. In 2011 he underwent 3V CABG for severe CAD and has done well since that time. He has a hx of  atrial flutter and has undergone AF ablation x3. More recently he has been having symptoms of dizziness and fatigue with subsequent echocardiogram 11/10/21 showing an LVEF at 55-60% with mitral stenosis, moderately severe aortic stenosis with mean gradient of 26 mmHg, DI 0.19, SVI 30. AVA 0.8 cm2. TEE 03/13/22 confirmed mild mitral stenosis with severe aortic stenosis.   In consultation, he was having NYHA class II/[name] symptoms and felt to be a good candidate for TAVR. Repeat cardiac catheterization showed severe three vessel CAD s/p 3V CABG with 3/3 patent bypass grafts and severe low flow/low gradient aortic stenosis by echo. CT imaging showed anatomy suitable for S3UR 47m valve.   He was then evaluated by the multidisciplinary valve team and felt to have severe, symptomatic aortic stenosis and to be a suitable candidate for TAVR, which was set up for 06/02/22.   Hospital Course     Severe AS: s/p successful TAVR with a 29 mm Edwards Sapien 3 THV via the TF approach on 06/02/22. Post operative echo with stable valve function with a mean gradient at 966mg with an AVA by VTI at 1.92cm2. Groin sites remain stable. EKG this AM shows NSR with V pacing. He was restarted on ASA '81mg'$  QD. He Matthew need lifelong dental SBE with Amoxicillin [facility]. He has ambulated with CRI with no concerns. See N/V plan below.    Symptomatic high grade AV block: Noted to have an episode overnight of symptomatic AV block with dizziness and near syncope. Rates in the 20's. HR spontaneously returned to NSR. Pacing pads on. No recurrence. EP consulted and  PPM placed 06/03/22. Post implant CXR with no acute findings and stable PPM placement. Shoulder restrictions reviewed with patient and placed in AVS for discharge.   Nausea and vomiting: Patient with nausea and vomiting overnight. Labs appear normal with no evidence of infectious process. Abdomen soft without pain. Hb stable at 11.2 which appears  to be at his baseline. Unclear etiology. [facility]    CAD s/p 3V CABG (2011): Denies anginal symptoms. Continue current regimen. Pre TAVR cath with patent grafts.    Atrial flutter: s/p multiple ablations and followed by Dr. CaCurt BearsNo longer on anticoagulation   Tachycardia: Noted to be quite tachycardic in the 90-110 range today. Restarted beta block now that PPM in place. Matthew follow OP for further antihypertensive titrations.    IDDM: SSI while inpatient and resume home medications at discharge.    MR/stenosis: Mild on recent TEE. Continue to follow with surveillance imaging.    Incidental findings: Small solid pulmonary nodule of the  left upper lobe measuring 3 mm. No follow-up needed if patient is low-risk. Non-contrast chest CT can be considered in 12 months if patient is high-risk. Matthew discuss at [facility] follow up with myself.    Elevated RAISE score: Cardiac Amyloid labs drawn while inpatient. Depending on results, may order outpatient PYP imaging.  Consultants: None    The patient has been seen and examined by Dr. Angelena [name] and Dr. Curt [name] who feel that he is stable and ready for discharge today, 06/04/22.  _____________  Discharge Vitals Blood pressure (!) 161/65, pulse (!) 107, temperature 98.6 F (37 C), temperature source Oral, resp. rate 19, height '5\' 6"'$  (1.676 m), weight 90.7 kg, SpO2 91 %.  Filed Weights   06/02/22 0559  Weight: 90.7 kg   General: Well developed, well nourished, NAD Skin: Warm, dry, intact  Lungs:Clear to ausculation bilaterally. No wheezes, rales, or rhonchi. Breathing is unlabored. Cardiovascular: RRR with S1 S2. No murmurs Abdomen: Soft, non-tender, non-distended. No rebound/guarding. No obvious abdominal masses. Extremities: No edema. Groin sites stable.  Neuro: Alert and oriented. No focal deficits. No facial asymmetry. MAE spontaneously. Psych: Responds to questions appropriately with normal affect.    Labs & Radiologic  Studies    CBC Recent Labs    06/03/22 0146 06/04/22 0057  WBC 7.9 9.5  HGB 11.4* 11.2*  HCT 34.2* 34.3*  MCV 88.4 88.9  PLT 190 921   Basic Metabolic Panel Recent Labs    06/03/22 0146 06/04/22 0057  NA 137 134*  K 4.6 4.2  CL 102 99  CO2 24 24  GLUCOSE 214* 226*  BUN 18 17  CREATININE 1.17 1.16  CALCIUM 8.5* 8.6*  MG 1.9  --    Liver Function Tests No results for input(s): "AST", "ALT", "ALKPHOS", "BILITOT", "PROT", "ALBUMIN" in the last 72 hours. No results for input(s): "LIPASE", "AMYLASE" in the last 72 hours. Cardiac Enzymes No results for input(s): "CKTOTAL", "CKMB", "CKMBINDEX", "TROPONINI" in the last 72 hours. BNP Invalid input(s): "POCBNP" D-Dimer No results for input(s): "DDIMER" in the last 72 hours. Hemoglobin A1C No results for input(s): "HGBA1C" in the last 72 hours. Fasting Lipid Panel No results for input(s): "CHOL", "HDL", "LDLCALC", "TRIG", "CHOLHDL", "LDLDIRECT" in the last 72 hours. Thyroid Function Tests No results for input(s): "TSH", "T4TOTAL", "T3FREE", "THYROIDAB" in the last 72 hours.  Invalid input(s): "FREET3" _____________  DG CHEST PORT 1 VIEW  Result Date: 06/04/2022 CLINICAL DATA:  Pacemaker.  Vomiting this morning. EXAM: PORTABLE CHEST 1 VIEW COMPARISON:  05/29/2022 FINDINGS: Apical lordotic positioning. Midline trachea. Mild cardiomegaly. Dual lead pacer leads at right atrium and right ventricle. TAVR. Median sternotomy for CABG. No pleural effusion or pneumothorax. Mild pulmonary interstitial prominence. No lobar consolidation. IMPRESSION: Cardiomegaly and mild pulmonary interstitial prominence, favoring pulmonary venous congestion. Electronically Signed   By: Abigail Miyamoto M.D.   On: 06/04/2022 08:34   EP PPM/ICD IMPLANT  Result Date: 06/03/2022 SURGEON:  Allegra Lai, MD   PREPROCEDURE DIAGNOSIS:  intermittent complete AV block   POSTPROCEDURE DIAGNOSIS:  intermittent complete AV block    PROCEDURES:  1. Pacemaker implantation.    INTRODUCTION:  Matthew [name] is a 77 y.o. male with a history of bradycardia who presents today for pacemaker implantation.  The patient reports intermittent episodes of dizziness over the past few months.  No reversible causes have been identified.  The patient therefore presents today for pacemaker implantation.   DESCRIPTION OF PROCEDURE:  Informed written consent was obtained, and  the patient was  brought to the electrophysiology lab in a fasting state.  The patient required no sedation for the procedure today.  The patients left chest was prepped and draped in the usual sterile fashion by the EP lab staff. The skin overlying the left deltopectoral region was infiltrated with lidocaine for local analgesia.  A 4-cm incision was made over the left deltopectoral region.  A left subcutaneous pacemaker pocket was fashioned using a combination of sharp and blunt dissection. Electrocautery was required to assure hemostasis. RA/RV Lead Placement: The left axillary vein was therefore cannulated.  Through the left axillary vein, a Abbott Ultipace 1231-52  (serial number  [id number]) right atrial lead and an Abbott Ultipace 1231-65 (serial number  [id number]) right ventricular lead were advanced with fluoroscopic visualization into the right atrial appendage and right ventricular apex positions respectively.  Initial atrial lead P- waves measured 1.5 mV with impedance of 397 ohms and a threshold of 0.5 V at 0.5 msec.  Right ventricular lead R-waves measured 11.6 mV with an impedance of 723 ohms and a threshold of 0.9 V at 0.5 msec.  Both leads were secured to the pectoralis fascia using #2-0 silk over the suture sleeves. Device Placement:  The leads were then connected to an [place]  (serial number  [id number] ) pacemaker.  The pocket was irrigated with copious gentamicin solution.  The pacemaker was then placed into the pocket.  The pocket was then closed in 3 layers with 2.0 Vicryl suture for the 3.0  Vicryl suture subcutaneous and subcuticular layers.  Steri-  Strips and a sterile dressing were then applied. EBL<87m.  There were no early apparent complications.   CONCLUSIONS:  1. Successful implantation of a Abbott Assurity [id number]-chamber pacemaker for symptomatic bradycardia  2. No early apparent complications.       Matthew CCurt Bears MD 06/03/2022 5:49 PM  ECHOCARDIOGRAM COMPLETE  Result Date: 06/03/2022    ECHOCARDIOGRAM REPORT   Patient Name:   Matthew [name] Date of Exam: 06/03/2022 Medical Rec #:  [id number]           Height:       66.0 in Accession #:    [id number]          Weight:       200.0 lb Date of Birth:  [birth year]          BSA:          2.000 m Patient Age:    89years             BP:           157/58 mmHg Patient Gender: M                    HR:           100 bpm. Exam Location:  Inpatient Procedure: 2D Echo, Color Doppler and Cardiac Doppler Indications:    I35.0 Nonrheumatic aortic (valve) stenosis  History:        Patient has prior history of Echocardiogram examinations, most                 recent 06/02/2022. CAD, Abnormal ECG, Aortic Valve Disease and                 Mitral Valve Disease, Arrythmias:Atrial Flutter; Risk                 Factors:Dyslipidemia and Diabetes. Aortic stenosis.  Aortic Valve: 29 mm Sapien prosthetic, stented (TAVR) valve is                 present in the aortic position. Procedure Date: 06/02/2022.  Sonographer:    Roseanna Rainbow RDCS Referring Phys: Alphonsa Overall MCDANIEL  Sonographer Comments: Technically difficult study due to poor echo windows. PA talking to patient during doppler. IMPRESSIONS  1. Left ventricular ejection fraction, by estimation, is 50 to 55%. The left ventricle has low normal function. The left ventricle has no regional wall motion abnormalities. There is mild concentric left ventricular hypertrophy. Left ventricular diastolic function could not be evaluated.  2. Right ventricular systolic function is normal. The right ventricular  size is normal. Tricuspid regurgitation signal is inadequate for assessing PA pressure.  3. Left atrial size was severely dilated.  4. Right atrial size was mildly dilated.  5. The mitral valve is degenerative. Trivial mitral valve regurgitation. Mild mitral stenosis. The mean mitral valve gradient is 7.0 mmHg with average heart rate of 99 bpm. Severe mitral annular calcification.  6. The aortic valve has been repaired/replaced. Aortic valve regurgitation is not visualized. There is a 29 mm Sapien prosthetic (TAVR) valve present in the aortic position. Procedure Date: 06/02/2022. Aortic valve mean gradient measures 9.0 mmHg. Aortic valve Vmax measures 1.92 m/s. Aortic valve acceleration time measures 74 msec.  7. The inferior vena cava is normal in size with greater than 50% respiratory variability, suggesting right atrial pressure of 3 mmHg. FINDINGS  Left Ventricle: Left ventricular ejection fraction, by estimation, is 50 to 55%. The left ventricle has low normal function. The left ventricle has no regional wall motion abnormalities. The left ventricular internal cavity size was normal in size. There is mild concentric left ventricular hypertrophy. Abnormal (paradoxical) septal motion, consistent with left bundle branch block. Left ventricular diastolic function could not be evaluated due to atrial fibrillation. Left ventricular diastolic function could not be evaluated. Right Ventricle: The right ventricular size is normal. Right vetricular wall thickness was not well visualized. Right ventricular systolic function is normal. Tricuspid regurgitation signal is inadequate for assessing PA pressure. Left Atrium: Left atrial size was severely dilated. Right Atrium: Right atrial size was mildly dilated. Pericardium: There is no evidence of pericardial effusion. Mitral Valve: The mitral valve is degenerative in appearance. Severe mitral annular calcification. Trivial mitral valve regurgitation. Mild mitral valve  stenosis. MV peak gradient, 16.7 mmHg. The mean mitral valve gradient is 7.0 mmHg with average heart rate of 99 bpm. Tricuspid Valve: The tricuspid valve is normal in structure. Tricuspid valve regurgitation is not demonstrated. Aortic Valve: The aortic valve has been repaired/replaced. Aortic valve regurgitation is not visualized. Aortic valve mean gradient measures 9.0 mmHg. Aortic valve peak gradient measures 14.8 mmHg. Aortic valve area, by VTI measures 2.36 cm. There is a 29 mm Sapien prosthetic, stented (TAVR) valve present in the aortic position. Procedure Date: 06/02/2022. Pulmonic Valve: The pulmonic valve was normal in structure. Pulmonic valve regurgitation is trivial. Aorta: The aortic root is normal in size and structure. Venous: The inferior vena cava is normal in size with greater than 50% respiratory variability, suggesting right atrial pressure of 3 mmHg. IAS/Shunts: No atrial level shunt detected by color flow Doppler.  LEFT VENTRICLE PLAX 2D LVIDd:         4.70 cm      Diastology LVIDs:         3.50 cm      LV e' medial:    7.29 cm/s  LV PW:         1.20 cm      LV E/e' medial:  25.7 LV IVS:        1.10 cm      LV e' lateral:   9.14 cm/s LVOT diam:     2.20 cm      LV E/e' lateral: 20.5 LV SV:         70 LV SV Index:   35 LVOT Area:     3.80 cm  LV Volumes (MOD) LV vol d, MOD A2C: 102.0 ml LV vol d, MOD A4C: 87.5 ml LV vol s, MOD A2C: 47.6 ml LV vol s, MOD A4C: 43.4 ml LV SV MOD A2C:     54.4 ml LV SV MOD A4C:     87.5 ml LV SV MOD BP:      47.8 ml RIGHT VENTRICLE            IVC RV S prime:     5.43 cm/s  IVC diam: 2.50 cm TAPSE (M-mode): 0.8 cm LEFT ATRIUM           Index        RIGHT ATRIUM           Index LA diam:      4.70 cm 2.35 cm/m   RA Area:     19.20 cm LA Vol (A2C): 91.3 ml 45.65 ml/m  RA Volume:   53.00 ml  26.50 ml/m LA Vol (A4C): 59.8 ml 29.90 ml/m  AORTIC VALVE                     PULMONIC VALVE AV Area (Vmax):    2.25 cm      PR End Diast Vel: 2.09 msec AV Area (Vmean):   2.25  cm AV Area (VTI):     2.36 cm AV Vmax:           192.40 cm/s AV Vmean:          133.200 cm/s AV VTI:            0.298 m AV Peak Grad:      14.8 mmHg AV Mean Grad:      9.0 mmHg LVOT Vmax:         114.00 cm/s LVOT Vmean:        78.850 cm/s LVOT VTI:          0.185 m LVOT/AV VTI ratio: 0.62  AORTA Ao Root diam: 3.20 cm Ao Asc diam:  3.40 cm MITRAL VALVE MV Area (PHT): 2.95 cm     SHUNTS MV Peak grad:  16.7 mmHg    Systemic VTI:  0.18 m MV Mean grad:  7.0 mmHg     Systemic Diam: 2.20 cm MV Vmax:       2.04 m/s MV Vmean:      118.0 cm/s MV Decel Time: 257 msec MV E velocity: 187.50 cm/s Dani Gobble Croitoru MD Electronically signed by Sanda Klein MD Signature Date/Time: 06/03/2022/9:35:23 AM    Final    ECHOCARDIOGRAM LIMITED  Result Date: 06/02/2022    ECHOCARDIOGRAM LIMITED REPORT   Patient Name:   Matthew [name] Date of Exam: 06/02/2022 Medical Rec #:  [id number]            Height:       66.0 in Accession #:    [id number]           Weight:       200.0  lb Date of Birth:  02/02/1946           BSA:          2.000 m Patient Age:    83 years             BP:           188/91 mmHg Patient Gender: M                    HR:           75 bpm. Exam Location:  Inpatient Procedure: Limited Echo, Cardiac Doppler and Color Doppler Indications:     Nonrheumatic aortic valve stenosis [I35.0 (ICD-10-CM)]  History:         Patient has prior history of Echocardiogram examinations, most                  recent 03/13/2022. CHF, Previous Myocardial Infarction and CAD,                  Aortic Valve Disease and Mitral Valve Disease,                  Arrythmias:non-specific ST changes and Atrial Fibrillation;                  Risk Factors:Diabetes, Dyslipidemia and Hypertension.                  Aortic Valve: 29 mm Sapien prosthetic, stented (TAVR) valve is                  present in the aortic position. Procedure Date: 06/02/2022.  Sonographer:     Darlina Sicilian RDCS Referring Phys:  [place] Diagnosing Phys: Sanda Klein MD                   PRE-PROCEDURE FINDINGS                   Mildly reduced left ventricular systolic function due to global                  hypokinesis. Estimated LVEF 50%.                  Trileaflet aortic valve with severe calcific stenosis.                   Aortic valve peak gradient 43 mm Hg, mean gradient 22 mm Hg,                  dimensionless index 0.18, calculated valve area 0.8 cm sq (0.4                  cm sq/m sq BSA).                  Mild aortic insufficiency.                  Mild-moderate centrally directed mitral insufficiency.                  No pericardial effusion.                   POST-PROCEDURE FINDINGS                   Normal left ventricular systolic function and regional wall                  motion.  Estimated LVEF 60%                  Well deployed stent valve (TAVR).                   Aortic valve peak gradient 4 mm Hg, mean gradient 3 mm Hg,                  dimensionless index 0.5, calculated valve area 2.26 cm sq (1.13                  cm sq/m sq BSA), acceleration time 80 ms.                  No aortic insufficiency or perivalvular leak.                  No pericardial effusion. IMPRESSIONS  1. Left ventricular ejection fraction, by estimation, is 50 to 55%. The left ventricle has low normal function. The left ventricle demonstrates global hypokinesis. Left ventricular diastolic parameters are consistent with Grade II diastolic dysfunction (pseudonormalization). Elevated left atrial pressure.  2. The mitral valve is degenerative. Mild mitral valve regurgitation. Mild mitral stenosis. The mean mitral valve gradient is 3.5 mmHg with average heart rate of 66 bpm. Severe mitral annular calcification.  3. Pre TAVR: severe aortic stenosis     Post TAVR: Well positioned 28m Edwards Sapien 3. No PVL visualized post deployment. AVA, by VTI measures 2.26 cm2. Procedure Date: 06/02/2022. Aortic valve area, by VTI measures 1.92 cm. Aortic valve mean gradient measures 3.0 mmHg.  Aortic valve Vmax measures 1.02 m/s. FINDINGS  Left Ventricle: Left ventricular ejection fraction, by estimation, is 50 to 55%. The left ventricle has low normal function. The left ventricle demonstrates global hypokinesis. Left ventricular diastolic parameters are consistent with Grade II diastolic  dysfunction (pseudonormalization). Elevated left atrial pressure. Mitral Valve: The mitral valve is degenerative in appearance. There is moderate thickening of the mitral valve leaflet(s). There is severe calcification of the mitral valve leaflet(s). Severe mitral annular calcification. Mild mitral valve regurgitation.  Mild mitral valve stenosis. MV peak gradient, 14.1 mmHg. The mean mitral valve gradient is 3.5 mmHg with average heart rate of 66 bpm. Aortic Valve: Pre TAVR The aortic valve has been repaired/replaced. There is severe calcifcation of the aortic valve. There is severe thickening of the aortic valve. Aortic valve regurgitation is not visualized. No aortic stenosis is present. Aortic valve mean gradient measures 3.0 mmHg. Aortic valve peak gradient measures 4.2 mmHg. Aortic valve area, by VTI measures 1.92 cm. Post TAVR: Well positioned 256mEdwards Sapien 3 valve mean gradient of 3 max gradient of 4 mmHG. No PVL visualized post deployment. AVA 2.26 cm2. There is severe calcifcation of the aortic valve. There is severe thickening of the aortic valve. There is a 29 mm Sapien prosthetic, stented (TAVR) valve present in the aortic position. Procedure Date: 06/02/2022. Echo findings are consistent with normal structure and function of the aortic valve prosthesis. Aorta: The aortic root and ascending aorta are structurally normal, with no evidence of dilitation. Additional Comments: Spectral Doppler performed. Color Doppler performed.  LEFT VENTRICLE PLAX 2D LVOT diam:     2.40 cm LV SV:         54 LV SV Index:   27 LVOT Area:     4.52 cm  AORTIC VALVE AV Area (Vmax):    2.26 cm AV Area (Vmean):   1.92 cm  AV Area (VTI):  1.92 cm AV Vmax:           102.00 cm/s AV Vmean:          78.700 cm/s AV VTI:            0.283 m AV Peak Grad:      4.2 mmHg AV Mean Grad:      3.0 mmHg LVOT Vmax:         51.00 cm/s LVOT Vmean:        33.400 cm/s LVOT VTI:          0.120 m LVOT/AV VTI ratio: 0.42 MITRAL VALVE MV Area VTI:  1.15 cm   SHUNTS MV Peak grad: 14.1 mmHg  Systemic VTI:  0.12 m MV Mean grad: 3.5 mmHg   Systemic Diam: 2.40 cm MV Vmax:      1.88 m/s MV Vmean:     80.8 cm/s Dani Gobble Croitoru MD Electronically signed by Sanda Klein MD Signature Date/Time: 06/02/2022/9:40:28 AM    Final    Structural Heart Procedure  Result Date: 06/02/2022 See surgical note for result.  DG Chest 2 View  Result Date: 05/29/2022 CLINICAL DATA:  Preop chest x-ray.  Patient for a TAVR procedure. EXAM: CHEST - 2 VIEW COMPARISON:  March 27, 2022 FINDINGS: The heart size and mediastinal contours are stable. Both lungs are clear. The visualized skeletal structures are unremarkable. IMPRESSION: No active cardiopulmonary disease. Electronically Signed   By: Abelardo Diesel M.D.   On: 05/29/2022 09:59   Disposition   Pt is being discharged home today in good condition.  Follow-up Plans & Appointments    Follow-up Information     Tommie Raymond, NP Follow up on 06/08/2022.   Specialty: Cardiology Why: @ 815am. Please arrive to your appointment at [facility] information: [place] [address] [phone]                Discharge Instructions     Call MD for:  difficulty breathing, headache or visual disturbances   Complete by: As directed    Call MD for:  extreme fatigue   Complete by: As directed    Call MD for:  hives   Complete by: As directed    Call MD for:  persistant dizziness or light-headedness   Complete by: As directed    Call MD for:  persistant nausea and vomiting   Complete by: As directed    Call MD for:  redness, tenderness, or signs of infection (pain, swelling,  redness, odor or green/yellow discharge around incision site)   Complete by: As directed    Call MD for:  severe uncontrolled pain   Complete by: As directed    Call MD for:  temperature >100.4   Complete by: As directed    Diet - low sodium heart healthy   Complete by: As directed    Discharge instructions   Complete by: As directed    ACTIVITY AND EXERCISE  Daily activity and exercise are an important part of your recovery. People recover at different rates depending on their general health and type of valve procedure.  Most people recovering from TAVR feel better relatively quickly   No lifting, pushing, pulling more than 10 pounds (examples to avoid: groceries, vacuuming, gardening, golfing):             - For one week with a procedure through the groin.             - For  six weeks for procedures through the chest wall or neck. NOTE: You Matthew typically see one of our providers 7-14 days after your procedure to discuss [place] the above activities.      DRIVING  Do not drive until you are seen for follow up and cleared by a provider. Generally, we ask patient to not drive for 1 week after their procedure.  If you have been told by your doctor in the past that you may not drive, you must talk with him/her before you begin driving again.   DRESSING  Groin site: you may leave the clear dressing over the site for up to one week or until it falls off.   HYGIENE  If you had a femoral (leg) procedure, you may take a shower when you return home. After the shower, pat the site dry. Do NOT use powder, oils or lotions in your groin area until the site has completely healed.  If you had a chest procedure, you may shower when you return home unless specifically instructed not to by your discharging practitioner.             - DO NOT scrub incision; pat dry with a towel.             - DO NOT apply any lotions, oils, powders to the incision.             - No tub baths / swimming for  at least 2 weeks.  If you notice any fevers, chills, increased pain, swelling, bleeding or pus, please contact your doctor.   ADDITIONAL INFORMATION  If you are going to have an upcoming dental procedure, please contact our office as you Matthew require antibiotics ahead of time to prevent infection on your heart valve.    If you have any questions or concerns you can call the structural heart phone during normal business hours 8am-4pm. If you have an urgent need after hours or weekends please call [phone] to talk to the on call provider for general cardiology. If you have an emergency that requires immediate attention, please call 911.    After TAVR Checklist  Check  Test Description  Follow up appointment in 1-2 weeks  You Matthew see our structural heart advanced practice provider. Your incision sites Matthew be checked and you Matthew be cleared to drive and resume all normal activities if you are doing well.    1 month echo and follow up  You Matthew have an echo to check on your new heart valve and be seen back in the office by a structural heart advanced practice provider.  Follow up with your primary cardiologist You Matthew need to be seen by your primary cardiologist in the following 3-6 months after your 1 month appointment in the valve clinic.   1 year echo and follow up You Matthew have another echo to check on your heart valve after 1 year and be seen back in the office by a structural heart advanced practice provider. This your last structural heart visit.  Bacterial endocarditis prophylaxis  You Matthew have to take antibiotics for the rest of your life before all dental procedures (even teeth cleanings) to protect your heart valve. Antibiotics are also required before some surgeries. Please check with your cardiologist before scheduling any surgeries. Also, please make sure to tell us if you have a penicillin allergy as you Matthew require an alternative antibiotic.        Supplemental Discharge  Instructions for  Pacemaker Patients  Activity Do not raise your left/right arm above shoulder level or extend it backward beyond shoulder level for 2 weeks. Wear the arm sling as a reminder or as needed for comfort for 2 weeks. No heavy lifting or vigorous activity with your left/right arm for 6-8 weeks.    NO DRIVING is preferable for 2 weeks; If absolutely necessary, drive only short, familiar routes. DO wear your seatbelt, even if it crosses over the pacemaker site.  WOUND CARE Keep the wound area clean and dry.  Remove the dressing the day after you return home (usually 48 hours after the procedure). DO NOT SUBMERGE UNDER WATER UNTIL FULLY HEALED (no tub baths, hot tubs, swimming pools, etc.).  You  may shower or take a sponge bath after the dressing is removed. DO NOT SOAK the area and do not allow the shower to directly spray on the site. If you have staples, these Matthew be removed in the office in 7-14 days. If you have tape/steri-strips on your wound, these Matthew fall off; do not pull them off prematurely.   No bandage is needed on the site.  DO  NOT apply any creams, oils, or ointments to the wound area. If you notice any drainage or discharge from the wound, any swelling, excessive redness or bruising at the site, or if you develop a fever > 101? F after you are discharged home, call the office at once.  Special Instructions You are still able to use cellular telephones.  Avoid carrying your cellular phone near your device. When traveling through airports, show security personnel your identification card to avoid being screened in the metal detectors.  Avoid arc welding equipment, MRI testing (magnetic resonance imaging), TENS units (transcutaneous nerve stimulators).  Call the office for questions about other devices. Avoid electrical appliances that are in poor condition or are not properly grounded. Microwave ovens are safe to be near or to operate.   Increase activity slowly    Complete by: As directed       Discharge Medications   Allergies as of 06/04/2022       Reactions   Actos [pioglitazone] Swelling   Bee Venom Swelling   Codeine Hives, Nausea Only   Metformin Nausea Only   Statins Other (See Comments)   Muscle pain, fatigue   Glucophage [metformin Hcl] Nausea Only   Raspberry Itching        Medication List     TAKE these medications    aspirin EC 81 MG tablet Take 81 mg by mouth in the morning. Swallow whole.   Bayer Back & Body 500-32.5 MG Tabs Generic drug: Aspirin-Caffeine Take 1 tablet by mouth daily as needed (pain).   BD Veo Insulin Syringe U/F 31G X 15/64" 0.5 ML Misc Generic drug: Insulin Syringe-Needle U-100 Use to inject 4 times a day   Dexcom G6 Receiver Devi Use to receive data from sensor   Dexcom G6 Sensor Misc Use to monitor blood sugar, change after 10 days   dorzolamide-timolol 2-0.5 % ophthalmic solution Commonly known as: COSOPT Place 1 drop into the left eye 2 (two) times daily.   furosemide 40 MG tablet Commonly known as: LASIX Take 1 tablet (40 mg total) by mouth daily as needed. What changed: reasons to take this   insulin aspart 100 UNIT/ML injection Commonly known as: NovoLOG Inject 18-20 Units into the skin 3 (three) times daily with meals. What changed:  how much to take additional instructions   latanoprost 0.005 %  ophthalmic solution Commonly known as: XALATAN Place 1 drop into the left eye at bedtime.   lisinopril 5 MG tablet Commonly known as: ZESTRIL TAKE 1 TABLET BY MOUTH EVERY DAY   Metoprolol Succinate 50 MG Cs24 Take 1/2 tablet by mouth in the morning and 1/2 tablet at bedtime (Total 50 mg )   NovoLIN N 100 UNIT/ML injection Generic drug: insulin NPH Human INJECT 22 UNITS IN THE SKIN DAILY AT BEDTIME. What changed: See the new instructions.   potassium chloride 10 MEQ tablet Commonly known as: Klor-Con 10 Take 1 tablet (10 mEq total) by mouth as needed. With Lasix What  changed:  when to take this reasons to take this additional instructions        Outstanding Labs/Studies   None   Duration of Discharge Encounter   Greater than 30 minutes including physician time.  SignedKathyrn Drown, NP 06/04/2022, 10:26 AM [phone]   I have personally seen and examined this patient. I agree with the assessment and plan as outlined above.  He is doing well today. Some n/v overnight. BP stable. Groins stable. Pacemaker in place. Echo with normally functioning AVR.  Matthew discharge home today.   Matthew Chandler, MD, [facility] 06/04/2022 10:34 AM

## 2022-06-02 NOTE — Anesthesia Procedure Notes (Signed)
Procedure Name: MAC Date/Time: 06/02/2022 7:37 AM  Performed by: Inda Coke, CRNAPre-anesthesia Checklist: Patient identified, Emergency Drugs available, Suction available, Timeout performed and Patient being monitored Patient Re-evaluated:Patient Re-evaluated prior to induction Oxygen Delivery Method: Simple face mask Induction Type: IV induction Dental Injury: Teeth and Oropharynx as per pre-operative assessment

## 2022-06-02 NOTE — Anesthesia Postprocedure Evaluation (Signed)
Anesthesia Post Note  Patient: Nasim L Westerfield III  Procedure(s) Performed: Transcatheter Aortic Valve Replacement, Transfemoral (Chest) INTRAOPERATIVE TRANSTHORACIC ECHOCARDIOGRAM     Patient location during evaluation: PACU Anesthesia Type: MAC Level of consciousness: awake and alert Pain management: pain level controlled Vital Signs Assessment: post-procedure vital signs reviewed and stable Respiratory status: spontaneous breathing, nonlabored ventilation, respiratory function stable and patient connected to nasal cannula oxygen Cardiovascular status: stable and blood pressure returned to baseline Postop Assessment: no apparent nausea or vomiting Anesthetic complications: no  No notable events documented.  Last Vitals:  Vitals:   06/02/22 0940 06/02/22 0944  BP: (!) 96/28 (!) 107/37  Pulse: (!) 50 (!) 53  Resp: 13 15  Temp:    SpO2: 99% 99%    Last Pain:  Vitals:   06/02/22 0629  TempSrc:   PainSc: 0-No pain                 Nirali Magouirk S

## 2022-06-02 NOTE — Progress Notes (Signed)
  Echocardiogram 2D Echocardiogram has been performed.  Darlina Sicilian M 06/02/2022, 9:12 AM

## 2022-06-02 NOTE — Transfer of Care (Signed)
Immediate Anesthesia Transfer of Care Note  Patient: Matthew [name]  Procedure(s) Performed: Transcatheter Aortic Valve Replacement, Transfemoral (Chest) INTRAOPERATIVE TRANSTHORACIC ECHOCARDIOGRAM  Patient Location: PACU  Anesthesia Type:MAC  Level of Consciousness: awake, alert , and oriented  Airway & Oxygen Therapy: Patient Spontanous Breathing and Patient connected to face mask oxygen  Post-op Assessment: Report given to RN and Post -op Vital signs reviewed and stable  Post vital signs: Reviewed and stable  Last Vitals:  Vitals Value Taken Time  BP 108/40 06/02/22 0928  Temp    Pulse 50 06/02/22 0932  Resp 18 06/02/22 0932  SpO2 99 % 06/02/22 0932  Vitals shown include unvalidated device data.  Last Pain:  Vitals:   06/02/22 0629  TempSrc:   PainSc: 0-No pain         Complications: No notable events documented.

## 2022-06-02 NOTE — Anesthesia Procedure Notes (Signed)
Arterial Line Insertion Start/End2/09/2022 7:05 AM, 06/02/2022 7:10 AM Performed by: Inda Coke, CRNA, CRNA  Patient location: Pre-op. Preanesthetic checklist: patient identified, IV checked, site marked, risks and benefits discussed, surgical consent, monitors and equipment checked, pre-op evaluation, timeout performed and anesthesia consent Lidocaine 1% used for infiltration Left, radial was placed Catheter size: 20 G Hand hygiene performed  and maximum sterile barriers used  Allen's test indicative of satisfactory collateral circulation Attempts: 1 Procedure performed without using ultrasound guided technique. Following insertion, dressing applied and Biopatch. Post procedure assessment: normal  Patient tolerated the procedure well with no immediate complications.

## 2022-06-03 ENCOUNTER — Inpatient Hospital Stay (HOSPITAL_COMMUNITY)
Admission: RE | Disposition: A | Discharge status: Home / Self Care | Payer: Self-pay | Source: Home / Self Care | Attending: Cardiovascular Disease

## 2022-06-03 ENCOUNTER — Inpatient Hospital Stay (HOSPITAL_COMMUNITY): Payer: Medicare HMO

## 2022-06-03 DIAGNOSIS — I442 Atrioventricular block, complete: Secondary | ICD-10-CM | POA: Diagnosis not present

## 2022-06-03 DIAGNOSIS — I350 Nonrheumatic aortic (valve) stenosis: Principal | ICD-10-CM

## 2022-06-03 DIAGNOSIS — Z006 Encounter for examination for normal comparison and control in clinical research program: Secondary | ICD-10-CM | POA: Diagnosis not present

## 2022-06-03 DIAGNOSIS — Z952 Presence of prosthetic heart valve: Secondary | ICD-10-CM

## 2022-06-03 DIAGNOSIS — I4892 Unspecified atrial flutter: Secondary | ICD-10-CM | POA: Diagnosis not present

## 2022-06-03 HISTORY — PX: PACEMAKER IMPLANT: EP1218

## 2022-06-03 LAB — ECHOCARDIOGRAM COMPLETE
AR max vel: 2.25 cm2
AV Area VTI: 2.36 cm2
AV Area mean vel: 2.25 cm2
AV Mean grad: 9 mmHg
AV Peak grad: 14.8 mmHg
Ao pk vel: 1.92 m/s
Area-P 1/2: 2.95 cm2
Calc EF: 50.6 %
Height: 66 in
S' Lateral: 3.5 cm
Single Plane A2C EF: 53.3 %
Single Plane A4C EF: 50.4 %
Weight: 3200 oz

## 2022-06-03 LAB — BASIC METABOLIC PANEL
Anion gap: 11 (ref 5–15)
BUN: 18 mg/dL (ref 8–23)
CO2: 24 mmol/L (ref 22–32)
Calcium: 8.5 mg/dL — ABNORMAL LOW (ref 8.9–10.3)
Chloride: 102 mmol/L (ref 98–111)
Creatinine, Ser: 1.17 mg/dL (ref 0.61–1.24)
GFR, Estimated: >60 mL/min (ref >60)
Glucose, Bld: 214 mg/dL — ABNORMAL HIGH (ref 70–99)
Potassium: 4.6 mmol/L (ref 3.5–5.1)
Sodium: 137 mmol/L (ref 135–145)

## 2022-06-03 LAB — MAGNESIUM: Magnesium: 1.9 mg/dL (ref 1.7–2.4)

## 2022-06-03 LAB — KAPPA/LAMBDA LIGHT CHAINS
Kappa free light chain: 38.2 mg/L — ABNORMAL HIGH (ref 3.3–19.4)
Kappa, lambda light chain ratio: 1.93 — ABNORMAL HIGH (ref 0.26–1.65)
Lambda free light chains: 19.8 mg/L (ref 5.7–26.3)

## 2022-06-03 LAB — CBC
HCT: 34.2 % — ABNORMAL LOW (ref 39.0–52.0)
Hemoglobin: 11.4 g/dL — ABNORMAL LOW (ref 13.0–17.0)
MCH: 29.5 pg (ref 26.0–34.0)
MCHC: 33.3 g/dL (ref 30.0–36.0)
MCV: 88.4 fL (ref 80.0–100.0)
Platelets: 190 10*3/uL (ref 150–400)
RBC: 3.87 MIL/uL — ABNORMAL LOW (ref 4.22–5.81)
RDW: 13.4 % (ref 11.5–15.5)
WBC: 7.9 10*3/uL (ref 4.0–10.5)
nRBC: 0 % (ref 0.0–0.2)

## 2022-06-03 LAB — SURGICAL PCR SCREEN
MRSA, PCR: NEGATIVE
Staphylococcus aureus: NEGATIVE

## 2022-06-03 LAB — GLUCOSE, CAPILLARY
Glucose-Capillary: 295 mg/dL — ABNORMAL HIGH (ref 70–99)
Glucose-Capillary: 308 mg/dL — ABNORMAL HIGH (ref 70–99)
Glucose-Capillary: 181 mg/dL — ABNORMAL HIGH (ref 70–99)
Glucose-Capillary: 167 mg/dL — ABNORMAL HIGH (ref 70–99)

## 2022-06-03 SURGERY — PACEMAKER IMPLANT

## 2022-06-03 MED ORDER — ACETAMINOPHEN 325 MG PO TABS
325.0000 mg | ORAL_TABLET | ORAL | Status: DC | PRN
  Administered 2022-06-03: 650 mg via ORAL
  Filled 2022-06-03: qty 2

## 2022-06-03 MED ORDER — CHLORHEXIDINE GLUCONATE 4 % EX LIQD
60.0000 mL | Freq: Once | CUTANEOUS | Status: AC
  Administered 2022-06-03: 4 via TOPICAL

## 2022-06-03 MED ORDER — CEFAZOLIN SODIUM-DEXTROSE 1-4 GM/50ML-% IV SOLN
1.0000 g | Freq: Four times a day (QID) | INTRAVENOUS | Status: AC
  Administered 2022-06-03 – 2022-06-04 (×3): 1 g via INTRAVENOUS
  Filled 2022-06-03 (×3): qty 50

## 2022-06-03 MED ORDER — SODIUM CHLORIDE 0.9 % IV SOLN
INTRAVENOUS | Status: AC
  Filled 2022-06-03: qty 2

## 2022-06-03 MED ORDER — CHLORHEXIDINE GLUCONATE 4 % EX LIQD
60.0000 mL | Freq: Once | CUTANEOUS | Status: DC
  Filled 2022-06-03: qty 60

## 2022-06-03 MED ORDER — SODIUM CHLORIDE 0.9 % IV SOLN: INTRAVENOUS | Status: DC

## 2022-06-03 MED ORDER — SODIUM CHLORIDE 0.9% FLUSH: 3.0000 mL | Freq: Two times a day (BID) | INTRAVENOUS | Status: DC

## 2022-06-03 MED ORDER — HEPARIN (PORCINE) IN NACL 1000-0.9 UT/500ML-% IV SOLN
INTRAVENOUS | Status: DC | PRN
  Administered 2022-06-03: 500 mL

## 2022-06-03 MED ORDER — SODIUM CHLORIDE 0.9 % IV SOLN: 250.0000 mL | INTRAVENOUS | Status: DC

## 2022-06-03 MED ORDER — CEFAZOLIN SODIUM-DEXTROSE 2-4 GM/100ML-% IV SOLN
2.0000 g | INTRAVENOUS | Status: DC
  Filled 2022-06-03: qty 100

## 2022-06-03 MED ORDER — SODIUM CHLORIDE 0.9 % IV SOLN
INTRAVENOUS | Status: DC | PRN
  Administered 2022-06-03: 80 mg

## 2022-06-03 MED ORDER — ONDANSETRON HCL 4 MG/2ML IJ SOLN
4.0000 mg | Freq: Four times a day (QID) | INTRAMUSCULAR | Status: DC | PRN
  Administered 2022-06-03 – 2022-06-04 (×2): 4 mg via INTRAVENOUS
  Filled 2022-06-03 (×3): qty 2

## 2022-06-03 MED ORDER — MIDAZOLAM HCL 5 MG/5ML IJ SOLN
INTRAMUSCULAR | Status: DC | PRN
  Administered 2022-06-03: 1 mg via INTRAVENOUS

## 2022-06-03 MED ORDER — SODIUM CHLORIDE 0.9% FLUSH: 3.0000 mL | INTRAVENOUS | Status: DC | PRN

## 2022-06-03 MED ORDER — LIDOCAINE HCL (PF) 1 % IJ SOLN
INTRAMUSCULAR | Status: AC
  Filled 2022-06-03: qty 60

## 2022-06-03 MED ORDER — FENTANYL CITRATE (PF) 100 MCG/2ML IJ SOLN
INTRAMUSCULAR | Status: DC | PRN
  Administered 2022-06-03: 25 ug via INTRAVENOUS

## 2022-06-03 MED ORDER — MIDAZOLAM HCL 5 MG/5ML IJ SOLN
INTRAMUSCULAR | Status: AC
  Filled 2022-06-03: qty 5

## 2022-06-03 MED ORDER — SODIUM CHLORIDE 0.9 % IV SOLN
80.0000 mg | INTRAVENOUS | Status: DC
  Filled 2022-06-03: qty 2

## 2022-06-03 MED ORDER — CEFAZOLIN SODIUM-DEXTROSE 2-4 GM/100ML-% IV SOLN
INTRAVENOUS | Status: AC
  Filled 2022-06-03: qty 100

## 2022-06-03 MED ORDER — FENTANYL CITRATE (PF) 100 MCG/2ML IJ SOLN
INTRAMUSCULAR | Status: AC
  Filled 2022-06-03: qty 2

## 2022-06-03 MED ORDER — INSULIN ASPART 100 UNIT/ML IJ SOLN
0.0000 [IU] | INTRAMUSCULAR | Status: DC
  Administered 2022-06-03 (×2): 4 [IU] via SUBCUTANEOUS
  Administered 2022-06-03: 12 [IU] via SUBCUTANEOUS
  Administered 2022-06-03 – 2022-06-04 (×2): 16 [IU] via SUBCUTANEOUS

## 2022-06-03 MED ORDER — HEPARIN (PORCINE) IN NACL 1000-0.9 UT/500ML-% IV SOLN
INTRAVENOUS | Status: AC
  Filled 2022-06-03: qty 500

## 2022-06-03 SURGICAL SUPPLY — 14 items
CABLE SURGICAL S-101-97-12 (CABLE) ×1 IMPLANT
CATH CPS LOCATOR 3D MED (CATHETERS) IMPLANT
HELIX LOCKING TOOL (MISCELLANEOUS) ×1
LEAD ULTIPACE 52 LPA1231/52 (Lead) IMPLANT
LEAD ULTIPACE 65 LPA1231/65 (Lead) IMPLANT
PACEMAKER ASSURITY DR-RF (Pacemaker) IMPLANT
PAD DEFIB RADIO PHYSIO CONN (PAD) ×1 IMPLANT
SHEATH 7FR PRELUDE SNAP 25 (SHEATH) IMPLANT
SHEATH 9FR PRELUDE SNAP 13 (SHEATH) IMPLANT
SHEATH PROBE COVER 6X72 (BAG) IMPLANT
SLITTER AGILIS HISPRO (INSTRUMENTS) IMPLANT
TOOL HELIX LOCKING (MISCELLANEOUS) IMPLANT
TRAY PACEMAKER INSERTION (PACKS) ×1 IMPLANT
WIRE HI TORQ VERSACORE-J 145CM (WIRE) IMPLANT

## 2022-06-03 NOTE — Progress Notes (Signed)
Obtained the consent form and placed it in pt's chart.   Lavenia Atlas, RN

## 2022-06-03 NOTE — Inpatient Diabetes Management (Signed)
Inpatient Diabetes Program Recommendations  AACE/ADA: New Consensus Statement on Inpatient Glycemic Control (2015)  Target Ranges:  Prepandial:   less than 140 mg/dL      Peak postprandial:   less than 180 mg/dL (1-2 hours)      Critically ill patients:  140 - 180 mg/dL   Lab Results  Component Value Date   GLUCAP 295 (H) 06/03/2022   HGBA1C 8.1 (H) 12/15/2021    Review of Glycemic Control  Diabetes history: DM2 Outpatient Diabetes medications: NPH 22 units QHS, Novolog 15-20 TID with meals plus SS  Current orders for Inpatient glycemic control: Novolog 0-24 units q 4 hrs.  Inpatient Diabetes Program Recommendations:   Noted patient scheduled for pacemaker implant today. Please consider: -Add Semglee 14 units qd  Thank you, Nani Gasser. [place]io Santilli, RN, MSN, CDE  Diabetes Coordinator Inpatient Glycemic Control Team Team Pager [phone] (8am-5pm) 06/03/2022 11:25 AM

## 2022-06-03 NOTE — Progress Notes (Signed)
Patient had an episode of symptomatic high grade AV block with HR in 20s around 8:20 pm He was nauseous and dizzy. His HR spontaneously went back upto 80s and symptoms resolved. 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  Pacer Pads placed- atropine at bedside. Will continue to monitor closely on Telemetry. Discussed with on call cardiologist- will continue to monitor for now. BP stable. If happens again, will consider TVP overnight.

## 2022-06-03 NOTE — Consult Note (Addendum)
Cardiology Consultation   Patient ID: Matthew [name] MRN: [id number]; DOB: 10-29-1945  Admit date: 06/02/2022 Date of Consult: 06/03/2022  PCP:  Wendie Agreste, MD   [facility] Cardiologist:  Zykee Avakian Meredith Leeds, MD  Electrophysiologist:  [name], MD  {    Patient Profile:   Matthew [name] is a 77 y.o. male with a hx of CAD(CABG 2011), AFlutter ablated/AFib, recurrent AFlutter,  DM, prostate Ca (hx of extensive XRT >> chronic back pain), RBBB, VHD w/severe AS   who is being seen 06/03/2022 for the evaluation of post TAVR intermittent CHB at the request of Dr. Angelena Form.  AFlutter Hx: Ablation of typical AFlutter 10/31/14 EPS 11/20/15:  LA flutter > DCCV EPS/PVI ablation 09/17/2016 AAD Hx: July 2017 started on amiodarone > stopped April 2018 Hx of QT prolongation on Tikosyn  Off a/c >> patient preference post ablation  History of Present Illness:   [name] was admitted yesterday for TAVR procedure that was done without immediate complications. Edwards Sapien 3 THV (size 29 mm)   Limited TTE post procedure LVEF 50-55% Post TAVR: Well positioned 11m Edwards Sapien 3. No PVL visualized  post deployment. AVA, by VTI measures 2.26 cm2.  Procedure Date:  06/02/2022. Aortic valve area, by VTI measures 1.92 cm. Aortic valve mean  gradient measures 3.0 mmHg. Aortic valve  Vmax measures 1.02 m/s.  findings are consistent with normal structure and function of the aortic valve prosthesis   Last night developed CHB.  LABS K+ 4.6 Mag 1.9 BUN/Creat 18/1.17 WBC 7.9 H/H 11,4/34.2 (baseline) Plts 190  patient denies any CP, SOB.  He was awake last night and well aware/symptomatic with his heart block, pre-syncopal. Feels well this AM  Home meds (nodal agents) Cosopt (timolol) No others No nodal blocking agents here  Past Medical History:  Diagnosis Date   Anemia    Arthritis    "back, left ankle" (11/20/2015)   Cataract     CHF (congestive heart failure) (HCC)    Chronic lower back pain    Coronary artery disease    a. 05/2009 CABG x 3: LIMA->LAD, VG->OM, VG->PDA; b. Nuc 01/2014: inf-lat scar but no ischemia, EF 57%.   Diabetic retinopathy (HCC)    mild- Dr. HHerbert Deaner  Glaucoma    Hyperlipidemia    a. Intolerant of lipitor and vytorin.   Lyme disease    Migraine    "once or twice" (11/20/2015)   Myocardial infarction ([facility]    'saw evidence of it on an EKG done in 2005"   [place]ox[place]    a. 01/2014 s/p DCCV;  b. CHA2DS2VASc = 3-->Eliquis.   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 McAngelena Formnd Dr. BaCyndia Bent Sacral pain    "right"   Sciatica of right side    Testicular cancer ([facility]   "heavy doses of radiation; it was stage 2"   Type II diabetes mellitus ([facility]   Valvular heart disease    a. 01/2014 Echo: Ef 60-65%, no rwma, mild AI/MS, mod MR, mildly dil LA.    Past Surgical History:  Procedure Laterality Date   ABDOMINAL EXPLORATION SURGERY  ~ 2005   "for hernia, but didn't have one"   ATRIAL FIBRILLATION ABLATION N/A 09/17/2016   Procedure: Atrial Fibrillation Ablation;  Surgeon: CaConstance HawMD;  [facility];  Service: Cardiovascular;  Laterality: N/A;   CARDIAC CATHETERIZATION  2011   CARDIOVERSION N/A 02/19/2014   Procedure: CARDIOVERSION;  Surgeon: [name], MD;  Location: [facility] ENDOSCOPY;  Service: Cardiovascular;  Laterality: N/A;   CARDIOVERSION N/A 10/28/2015   Procedure: CARDIOVERSION;  Surgeon: [name], MD;  Location: [place];  Service: Cardiovascular;  Laterality: N/A;   CARDIOVERSION  11/20/2015   "200 joules"   CORONARY ARTERY BYPASS GRAFT  05/2009   LIMA-LAD, SVG-OM, SVG-PDA 06/20/09   ELBOW FRACTURE SURGERY Left    broken ulna on left elbow-surgical repair   ELECTROPHYSIOLOGIC STUDY N/A 10/31/2014   Procedure: A-Flutter;  Surgeon: [name], MD;  Location: [place] [facility] LAB;  Service: Cardiovascular;  Laterality: N/A;   ELECTROPHYSIOLOGIC STUDY N/A 11/20/2015   Procedure: A-Flutter Ablation;  Surgeon: [name], MD;  Location: [place] [facility] LAB;  Service: Cardiovascular;  Laterality: N/A;   EYE SURGERY     FRACTURE SURGERY     RIGHT/LEFT HEART CATH AND CORONARY/GRAFT ANGIOGRAPHY N/A 04/13/2022   Procedure: RIGHT/LEFT HEART CATH AND CORONARY/GRAFT ANGIOGRAPHY;  Surgeon: Burnell Blanks, MD;  Location: [place] [facility] LAB;  Service: Cardiovascular;  Laterality: N/A;   TEE WITHOUT CARDIOVERSION N/A 03/13/2022   Procedure: TRANSESOPHAGEAL ECHOCARDIOGRAM (TEE);  Surgeon: Sanda Klein, MD;  Location: [place];  Service: Cardiovascular;  Laterality: N/A;   TESTICLE REMOVAL Right 1982   TONSILLECTOMY  ~ [place] Medications:  Prior to Admission medications   Medication Sig Start Date End Date Taking? Authorizing Provider  aspirin EC 81 MG tablet Take 81 mg by mouth in the morning. Swallow whole.   Yes [provider]  Aspirin-Caffeine (BAYER BACK & BODY) 500-32.5 MG TABS Take 1 tablet by mouth daily as needed (pain).   Yes [provider]  dorzolamide-timolol (COSOPT) 22.3-6.8 MG/ML ophthalmic solution Place 1 drop into the left eye 2 (two) times daily. 10/01/21  Yes [provider]  furosemide (LASIX) 40 MG tablet Take 1 tablet (40 mg total) by mouth daily as needed. Patient taking differently: Take 40 mg by mouth daily as needed for fluid. 08/14/21  Yes Tequilla Cousineau Hassell Done, MD  insulin aspart (NOVOLOG) 100 UNIT/ML injection Inject 18-20 Units into the skin 3 (three) times daily with meals. Patient taking differently: Inject 15-20 Units into the skin 3 (three) times daily with meals. Sliding scale 03/16/22  Yes Elayne Snare, MD  latanoprost (XALATAN) 0.005 % ophthalmic solution Place 1 drop into the left eye at bedtime. 04/01/21  Yes [provider]  lisinopril (ZESTRIL) 5  MG tablet TAKE 1 TABLET BY MOUTH EVERY DAY 02/16/22  Yes Emersynn Deatley, Ocie Doyne, MD  Metoprolol Succinate 50 MG CS24 Take 1/2 tablet by mouth in the morning and 1/2 tablet at bedtime (Total 50 mg ) 01/19/22  Yes Siennah Barrasso Hassell Done, MD  NOVOLIN N 100 UNIT/ML injection INJECT 22 UNITS IN THE SKIN DAILY AT BEDTIME. Patient taking differently: 20 Units at bedtime. 05/04/22  Yes Elayne Snare, MD  potassium chloride (KLOR-CON 10) 10 MEQ tablet Take 1 tablet (10 mEq total) by mouth as needed. With Lasix Patient taking differently: Take 10 mEq by mouth daily as needed (when taking lasix). 11/03/21  Yes Baldwin Jamaica, PA-C  Continuous Blood Gluc Receiver (DEXCOM G6 RECEIVER) DEVI Use to receive data from sensor 05/23/21   Elayne Snare, MD  Continuous Blood Gluc Sensor (DEXCOM G6 SENSOR) MISC Use to monitor blood sugar, change after 10 days 05/23/21   Dwyane Dee,  Vicenta Aly, MD  Insulin Syringe-Needle U-100 (BD VEO INSULIN SYRINGE U/F) 31G X 15/64" 0.5 ML MISC Use to inject 4 times a day 08/11/21   Elayne Snare, MD    Inpatient Medications: Scheduled Meds:  aspirin EC  81 mg Oral q AM   atropine       dorzolamide-timolol  1 drop Left Eye BID   insulin aspart  0-24 Units Subcutaneous Q4H   sodium chloride flush  3 mL Intravenous Q12H   Continuous Infusions:  sodium chloride     sodium chloride     nitroGLYCERIN     PRN Meds: sodium chloride, acetaminophen **OR** acetaminophen, atropine, ondansetron (ZOFRAN) IV, oxyCODONE, sodium chloride flush  Allergies:    Allergies  Allergen Reactions   Actos [Pioglitazone] Swelling   Bee Venom Swelling   Codeine Hives and Nausea Only   Metformin Nausea Only   Statins Other (See Comments)    Muscle pain, fatigue   Glucophage [Metformin Hcl] Nausea Only   Raspberry Itching    Social History:   Social History   Socioeconomic History   Marital status: Married    Spouse name: Not on file   Number of children: 0   Years of education: Not on file   Highest education  level: Not on file  Occupational History   Occupation: Ambulance person  Tobacco Use   Smoking status: Former    Packs/day: 0.50    Years: 15.00    Total pack years: 7.50    Types: Cigarettes    Quit date: 1990    Years since quitting: 34.1   Smokeless tobacco: Never   Tobacco comments:    "quit smoking cigarettes in the 1980's; don't know how much or for how long"  Vaping Use   Vaping Use: Never used  Substance and Sexual Activity   Alcohol use: Yes    Alcohol/week: 3.0 standard drinks of alcohol    Types: 3 Cans of beer per week   Drug use: No    Comment: "quit smoking pot in the 1990s"   Sexual activity: Not Currently    Birth control/protection: Coitus interruptus  Other Topics Concern   Not on file  Social History Narrative   Not on file   Social Determinants of Health   Financial Resource Strain: Not on file  Food Insecurity: No Food Insecurity (03/28/2022)   Hunger Vital Sign    Worried About Running Out of Food in the Last Year: Never true    Ran Out of Food in the Last Year: Never true  Transportation Needs: No Transportation Needs (03/28/2022)   PRAPARE - Hydrologist (Medical): No    Lack of Transportation (Non-Medical): No  Physical Activity: Not on file  Stress: Not on file  Social Connections: Not on file  Intimate Partner Violence: Not At Risk (03/28/2022)   Humiliation, Afraid, Rape, and Kick questionnaire    Fear of Current or Ex-Partner: No    Emotionally Abused: No    Physically Abused: No    Sexually Abused: No    Family History:   Family History  Problem Relation Age of Onset   Diabetes Mother    Heart disease Mother        Heart murmur   Heart disease Father    Heart attack Father    Diabetes Father    Hypertension Father    Diabetes Sister      ROS:  Please see the history of present illness.  All  other ROS reviewed and negative.     Physical Exam/Data:   Vitals:   06/02/22 2024 06/02/22 2300  06/03/22 0410 06/03/22 0700  BP: (!) 195/83 (!) 148/62 (!) 167/58 (!) 168/67  Pulse: 92 82 98 (!) 101  Resp: '20 16 17 18  '$ Temp: 98.6 F (37 C) 98.9 F (37.2 C) 98.5 F (36.9 C) 98.5 F (36.9 C)  TempSrc: Oral Oral Oral Oral  SpO2: 99% 97% 98% 98%  Weight:      Height:        Intake/Output Summary (Last 24 hours) at 06/03/2022 0817 Last data filed at 06/03/2022 0756 Gross per 24 hour  Intake 700 ml  Output 720 ml  Net -20 ml      06/02/2022    5:59 AM 05/20/2022   11:50 AM 04/13/2022    6:06 AM  Last 3 Weights  Weight (lbs) 200 lb 196 lb 196 lb  Weight (kg) 90.719 kg 88.905 kg 88.905 kg     Body mass index is 32.28 kg/m.  General:  Well nourished, well developed, in no acute distress HEENT: normal Neck: no JVD Vascular: No carotid bruits; Distal pulses 2+ bilaterally Cardiac:  RRR; no murmurs, gallops or rubs Lungs:  CTA b/l, no wheezing, rhonchi or rales  Abd: soft, nontender Ext: 1+ edema b/l Musculoskeletal:  No deformities Skin: warm and dry  Neuro:  no gross focal motor abnormalities noted Psych:  Normal affect   EKG:  The EKG was personally reviewed and demonstrates:    SB 57bpm, PACs, RBBB (152), 1st degree AVblock 274m  OLD 05/29/22: SR 93bpm, RBBB, 2st degree AVblock 2451m(manually measured) 03/27/22: SR 72bpm, RBBB, 1st degree AVBlock 2646m  Telemetry:  Telemetry was personally reviewed and demonstrates:   SR 70's-90's Last evening episodes of CHB with rates towards 30bpm and V standstill of 14 seconds    Relevant CV Studies:  06/02/22: limited TTE 1. Left ventricular ejection fraction, by estimation, is 50 to 55%. The  left ventricle has low normal function. The left ventricle demonstrates  global hypokinesis. Left ventricular diastolic parameters are consistent  with Grade II diastolic dysfunction  (pseudonormalization). Elevated left atrial pressure.   2. The mitral valve is degenerative. Mild mitral valve regurgitation.  Mild mitral stenosis.  The mean mitral valve gradient is 3.5 mmHg with  average heart rate of 66 bpm. Severe mitral annular calcification.   3. Pre TAVR: severe aortic stenosis      Post TAVR: Well positioned 94m47mwards Sapien 3. No PVL visualized  post deployment. AVA, by VTI measures 2.26 cm2. Procedure Date:  06/02/2022. Aortic valve area, by VTI measures 1.92 cm. Aortic valve mean  gradient measures 3.0 mmHg. Aortic valve  Vmax measures 1.02 m/s.    04/13/22; LHC Prox RCA lesion is 100% stenosed.   Ost Cx to Prox Cx lesion is 100% stenosed.   Ost LM to Mid LM lesion is 90% stenosed.   Mid LAD lesion is 100% stenosed.   SVG graft was visualized by angiography and is normal in caliber.   SVG graft was visualized by angiography and is normal in caliber.   LIMA graft was visualized by angiography and is normal in caliber.   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 Patent LIMA to mid LAD Chronic occlusion proximal Circumflex. Patent vein graft to the obtuse marginal branch Chronic occlusion proximal RCA. Patent vein graft to the  PDA filling the posterolateral artery as well as the distal RCA Severe low flow/low gradient aortic stenosis by echo. The TEE was reviewed by our structural heart team and the consensus was that the stenosis was severe.  (Cath data: peak to peak gradient 18.8 mmHg, Mean gradient 16.4 mmHg).  Recommendations: Cheyenne Schumm continue planning for TAVR. We Farrin Shadle arrange his CT scans and then plan a visit with one of the CT surgeons on our TAVR team.    03/13/22: TEE 1. Left ventricular ejection fraction, by estimation, is 55 to 60%. The  left ventricle has normal function. The left ventricle has no regional  wall motion abnormalities. There is mild concentric left ventricular  hypertrophy. Left ventricular diastolic  parameters are consistent with Grade II diastolic dysfunction  (pseudonormalization). Elevated  left atrial pressure.   2. Right ventricular systolic function is normal. The right ventricular  size is normal. There is normal pulmonary artery systolic pressure. The  estimated right ventricular systolic pressure is 18.8 mmHg.   3. Left atrial size was moderately dilated. No left atrial/left atrial  appendage thrombus was detected.   4. Right atrial size was moderately dilated.   5. The basal two-thirds of the anterior mitral leaflet are heavily  calcified and almost motionless, whereas the posterior leaflet has  preserved mobility. The mitral valve is degenerative. Mild to moderate  mitral valve regurgitation. Mild mitral  stenosis. The mean mitral valve gradient is 4.0 mmHg with average heart  rate of 77 bpm. Moderate mitral annular calcification.   6. The aortic valve is tricuspid. There is severe calcifcation of the  aortic valve. There is severe thickening of the aortic valve. Aortic valve  regurgitation is trivial. Moderate to severe aortic valve stenosis.   7. There is Moderate (Grade [name]) protruding plaque involving the  descending aorta and aortic arch.   Conclusion(s)/Recommendation(s): The degenerative changes of the mitral  and aortic valves and in particular the appearance of the anterior mitral  leaflet, is strongly suggestive of sequelae of previous radiation therapy.    11/10/21: TTE 1. Left ventricular ejection fraction, by estimation, is 55 to 60%. The  left ventricle has normal function. The left ventricle has no regional  wall motion abnormalities. There is mild left ventricular hypertrophy.  Left ventricular diastolic parameters  are consistent with Grade II diastolic dysfunction (pseudonormalization).  Elevated left atrial pressure.   2. Right ventricular systolic function is normal. The right ventricular  size is normal.   3. Left atrial size was mildly dilated.   4. Right atrial size was mild to moderately dilated.   5. MV is thickened, difficult to see  leaflets well Peak and mean  gradients through the valve are 14 and 4 mm Hg respectively. 2D images  suggest mitral valve is severely narrowed. Pressure half time valve area  is calculated at 2.64 cm2, probably an over  estimate due to increased filling pressures. Calculation of valve with  continuity equation not accurate due to inablity to accurately measure  LVOT. Would recomm TEE to further evaluate valve. Moderate mitral valve  regurgitation.   6. AV is thickened, calcified Peak and mean gradients through the valve  are 45 and 26 mm Hg respectively. 2 D imaging it appears to be severely  narrowed. Due to calcification of mitral annulus, LVOT measurement is not  accurate. Would recomm another  imaging modality like TEE to define Aortic valve regurgitation is mild.   7. The inferior vena cava is normal in size with greater  than 50%  respiratory variability, suggesting right atrial pressure of 3 mmHg.   Comparison(s): 08/21/16 EF 55-60%.     09/17/2016: EPS/ablation CONCLUSIONS: 1. Sinus rhythm upon presentation.   2. Successful electrical isolation and anatomical encircling of all four pulmonary veins with radiofrequency current. 3. No inducible arrhythmias following ablation both on and off of Isuprel 4. No early apparent complications.   08/21/2016: TTE Study Conclusions  - Left ventricle: The cavity size was normal. Wall thickness was    increased in a pattern of mild LVH. Systolic function was normal.    The estimated ejection fraction was in the range of 55% to 60%.    Wall motion was normal; there were no regional wall motion    abnormalities. Features are consistent with a pseudonormal left    ventricular filling pattern, with concomitant abnormal relaxation    and increased filling pressure (grade 2 diastolic dysfunction).    Doppler parameters are consistent with high ventricular filling    pressure.  - Mitral valve: Severely calcified annulus. There was mild     regurgitation.  - Left atrium: The atrium was mildly dilated.   Impressions:  - Normal LV systolic function; moderate diastolic dysfunction with    elevated LV filling pressure; mild MR; mild LAE.      02/05/14: TTE Study Conclusions - Left ventricle: The cavity size was normal. There was mild focal   basal hypertrophy of the septum. Systolic function was normal.   The estimated ejection fraction was in the range of 60% to 65%.   Wall motion was normal; there were no regional wall motion   abnormalities. - Aortic valve: Valve mobility was restricted. There was mild   regurgitation. Mean gradient (S): 7 mm Hg. Peak gradient (S): 12   mm Hg. Valve area (VTI): 2.03 cm^2. Valve area (Vmean): 2.02   cm^2. - Mitral valve: Calcified annulus. Mildly thickened leaflets . The   findings are consistent with mild stenosis. There was moderate   regurgitation. Valve area by continuity equation (using LVOT   flow): 2.29 cm^2. - Left atrium: The atrium was mildly dilated. - Right ventricle: Systolic function was mildly reduced. - Right atrium: The atrium was mildly dilated. Impressions: - Normal LV function; mild biatrial enlargement; thickened MV;   moderate MR; mild MS by mean gradient (6 mmHg); calcified aortic   valve but no significant AS by doppler; mild AI.   11/20/2015: EPS 1. LA flutter 2. Successful DCCV with 200 Joules of biphasic energy.   10/31/2014: EPS/ablation CONCLUSIONS:  1. Isthmus-dependent right atrial flutter upon presentation.  2. Successful radiofrequency ablation of atrial flutter along the cavotricuspid isthmus with complete bidirectional isthmus block achieved.  3. No inducible arrhythmias following ablation.  4. No early apparent complications.    02/06/14: stress myoview Impression Exercise Capacity:  Poor exercise capacity. BP Response:  Normal blood pressure response. Clinical Symptoms:  There is dyspnea. ECG Impression:  A-fib with RVR at baseline and with  mild exercise. Comparison with Prior Nuclear Study: No previous nuclear study performed Overall Impression:  Intermediate risk stress nuclear study with large-sized (Extent 18%), severe intensity fixed inferolateral defect consitent with LCx territory scar. LV Ejection Fraction: 57%.  LV Wall Motion:  inferolateral akinesis Laboratory Data:  High Sensitivity Troponin:  No results for input(s): "TROPONINIHS" in the last 720 hours.   Chemistry Recent Labs  Lab 05/29/22 0830 06/02/22 0748 06/02/22 0845 06/02/22 0941 06/03/22 0146  NA 139   < > 140 140 137  K 4.6   < > 4.4 4.5 4.6  CL 103   < > 102 104 102  CO2 28  --   --   --  24  GLUCOSE 208*   < > 237* 198* 214*  BUN 19   < > '19 20 18  '$ CREATININE 1.16   < > 1.00 1.10 1.17  CALCIUM 8.9  --   --   --  8.5*  MG  --   --   --   --  1.9  GFRNONAA >60  --   --   --  >60  ANIONGAP 8  --   --   --  11   < > = values in this interval not displayed.    Recent Labs  Lab 05/29/22 0830  PROT 6.5  ALBUMIN 3.5  AST 23  ALT 15  ALKPHOS 78  BILITOT 0.6   Lipids No results for input(s): "CHOL", "TRIG", "HDL", "LABVLDL", "LDLCALC", "CHOLHDL" in the last 168 hours.  Hematology Recent Labs  Lab 05/29/22 0830 06/02/22 0748 06/02/22 0845 06/02/22 0941 06/03/22 0146  WBC 7.3  --   --   --  7.9  RBC 4.20*  --   --   --  3.87*  HGB 12.3*   < > 9.9* 10.5* 11.4*  HCT 37.7*   < > 29.0* 31.0* 34.2*  MCV 89.8  --   --   --  88.4  MCH 29.3  --   --   --  29.5  MCHC 32.6  --   --   --  33.3  RDW 13.4  --   --   --  13.4  PLT 248  --   --   --  190   < > = values in this interval not displayed.   Thyroid No results for input(s): "TSH", "FREET4" in the last 168 hours.  BNPNo results for input(s): "BNP", "PROBNP" in the last 168 hours.  DDimer No results for input(s): "DDIMER" in the last 168 hours.   Radiology/Studies:     Assessment and Plan:   CHB POD #1 TAVR Baseline conduction system disease with pre-procedure RBBB, 1st degree  AVBlock No nodal blocking agents He Kemora Pinard need PPM Discussed rational for PPM and the implant procedure, potential risks/benefits He is agreeable to proceed  Dr. Curt Bears is aware, Emillie Chasen see him later this AM Pacer pads are on the patient  Further with attending team   Risk Assessment/Risk Scores:     For questions or updates, please contact [place] Please consult [email or web address] for contact info under    Signed, Baldwin Jamaica, PA-C  06/03/2022 8:17 AM  I have seen and examined this patient with [name].  Agree with above, note added to reflect my findings.  Patient is postop day 1 from TAVR.  Procedure went well.  Overnight last night, he began to have long pauses of up to 14 seconds.  He has a baseline right bundle branch block.  Today he feels well.  No acute complaints.  GEN: Well nourished, well developed, in no acute distress  HEENT: normal  Neck: no JVD, carotid bruits, or masses Cardiac: RRR; no murmurs, rubs, or gallops,no edema  Respiratory:  clear to auscultation bilaterally, normal work of breathing GI: soft, nontender, nondistended, + BS MS: no deformity or atrophy  Skin: warm and dry Neuro:  Strength and sensation are intact Psych: euthymic mood, full affect   Complete heart block: Has intermittent episodes.  Has a right bundle branch block and with TAVR, Michaell Grider likely need pacemaker implant.  Risk and benefits have been discussed.  Risk of bleeding, tamponade, infection, pneumothorax, lead dislodgment.  He understands these risks and is agreed to the procedure. Severe aortic stenosis: Status post TAVR Coronary artery disease: Status post CABG.  Lamiya Naas M. Baylei Siebels MD 06/03/2022 10:51 AM

## 2022-06-03 NOTE — Progress Notes (Signed)
2030 Pt had an episode of symptomatic bradycardia in the 20s and subsequently went up to 80s and 90s. Pt's BP elevated 218/95. Pt experienced nausea, vomiting and dizziness. PRN given for nausea/vomiting. Dr. Bunnie Pion notified.  2035 Order given. Dr. Humphrey Rolls at bedside. Pt denies symptoms.

## 2022-06-03 NOTE — Progress Notes (Addendum)
[facility] VALVE TEAM  Patient Name: Matthew [name] Date of Encounter: 06/03/2022  Primary Cardiologist: Will Meredith Leeds, MD / Dr. Angelena Form, MD and Dr. Cyndia Bent, MD (TAVR)   Hospital Problem List     Principal Problem:   S/P TAVR (transcatheter aortic valve replacement) Active Problems:   Coronary atherosclerosis of native coronary artery   Diabetes mellitus, type 2 (HCC)   Hyperlipidemia   Paroxysmal atrial flutter (HCC)   Mitral valve stenosis, non-rheumatic   Aortic stenosis   Severe aortic stenosis   Heart block AV complete ([place])   Subjective   Feeling ok right now with no symptoms of dizziness, SOB, or chest pain. Had an episode of symptomatic CHB overnight with return to NSR. Maintaining SR now. Echo being performed.   Inpatient Medications    Scheduled Meds:  aspirin EC  81 mg Oral q AM   dorzolamide-timolol  1 drop Left Eye BID   insulin aspart  0-24 Units Subcutaneous Q4H   sodium chloride flush  3 mL Intravenous Q12H   sodium chloride flush  3 mL Intravenous Q12H   Continuous Infusions:  sodium chloride     sodium chloride     nitroGLYCERIN     PRN Meds: sodium chloride, acetaminophen **OR** acetaminophen, ondansetron (ZOFRAN) IV, oxyCODONE, sodium chloride flush   Vital Signs    Vitals:   06/02/22 2024 06/02/22 2300 06/03/22 0410 06/03/22 0700  BP: (!) 195/83 (!) 148/62 (!) 167/58 (!) 168/67  Pulse: 92 82 98 (!) 101  Resp: '20 16 17 18  '$ Temp: 98.6 F (37 C) 98.9 F (37.2 C) 98.5 F (36.9 C) 98.5 F (36.9 C)  TempSrc: Oral Oral Oral Oral  SpO2: 99% 97% 98% 98%  Weight:      Height:        Intake/Output Summary (Last 24 hours) at 06/03/2022 0857 Last data filed at 06/03/2022 0756 Gross per 24 hour  Intake --  Output 700 ml  Net -700 ml   Filed Weights   06/02/22 0559  Weight: 90.7 kg   Physical Exam    General: Well developed, well nourished, NAD Lungs:Clear to ausculation bilaterally.  No wheezes, rales, or rhonchi. Breathing is unlabored. Cardiovascular: RRR with S1 S2. Soft flow murmur Extremities: No edema. Groin sites stable.  Neuro: Alert and oriented. No focal deficits. No facial asymmetry. MAE spontaneously. Psych: Responds to questions appropriately with normal affect.    Labs    CBC Recent Labs    06/02/22 0941 06/03/22 0146  WBC  --  7.9  HGB 10.5* 11.4*  HCT 31.0* 34.2*  MCV  --  88.4  PLT  --  700   Basic Metabolic Panel Recent Labs    06/02/22 0941 06/03/22 0146  NA 140 137  K 4.5 4.6  CL 104 102  CO2  --  24  GLUCOSE 198* 214*  BUN 20 18  CREATININE 1.10 1.17  CALCIUM  --  8.5*  MG  --  1.9   Liver Function Tests No results for input(s): "AST", "ALT", "ALKPHOS", "BILITOT", "PROT", "ALBUMIN" in the last 72 hours. No results for input(s): "LIPASE", "AMYLASE" in the last 72 hours. Cardiac Enzymes No results for input(s): "CKTOTAL", "CKMB", "CKMBINDEX", "TROPONINI" in the last 72 hours. BNP Invalid input(s): "POCBNP" D-Dimer No results for input(s): "DDIMER" in the last 72 hours. Hemoglobin A1C No results for input(s): "HGBA1C" in the last 72 hours. Fasting Lipid Panel No results for input(s): "  CHOL", "HDL", "LDLCALC", "TRIG", "CHOLHDL", "LDLDIRECT" in the last 72 hours. Thyroid Function Tests No results for input(s): "TSH", "T4TOTAL", "T3FREE", "THYROIDAB" in the last 72 hours.  Invalid input(s): "FREET3"  Telemetry    NSR/ST. Episode of CHB on tele review. EP consutled - Personally Reviewed  ECG    NSR with 1st degree AV block, RBBB and PAC - Personally Reviewed  Radiology    ECHOCARDIOGRAM LIMITED  Result Date: 06/02/2022    ECHOCARDIOGRAM LIMITED REPORT   Patient Name:   Matthew [name] Date of Exam: 06/02/2022 Medical Rec #:  [id number]            Height:       66.0 in Accession #:    [id number]           Weight:       200.0 lb Date of Birth:  [birth year]           BSA:          2.000 m Patient Age:    77 years              BP:           188/91 mmHg Patient Gender: M                    HR:           75 bpm. Exam Location:  Inpatient Procedure: Limited Echo, Cardiac Doppler and Color Doppler Indications:     Nonrheumatic aortic valve stenosis [I35.0 (ICD-10-CM)]  History:         Patient has prior history of Echocardiogram examinations, most                  recent 03/13/2022. CHF, Previous Myocardial Infarction and CAD,                  Aortic Valve Disease and Mitral Valve Disease,                  Arrythmias:non-specific ST changes and Atrial Fibrillation;                  Risk Factors:Diabetes, Dyslipidemia and Hypertension.                  Aortic Valve: 29 mm Sapien prosthetic, stented (TAVR) valve is                  present in the aortic position. Procedure Date: 06/02/2022.  Sonographer:     Darlina Sicilian RDCS Referring Phys:  Cavour Diagnosing Phys: Sanda Klein MD                   PRE-PROCEDURE FINDINGS                   Mildly reduced left ventricular systolic function due to global                  hypokinesis. Estimated LVEF 50%.                  Trileaflet aortic valve with severe calcific stenosis.                   Aortic valve peak gradient 43 mm Hg, mean gradient 22 mm Hg,                  dimensionless index 0.18, calculated valve area 0.8 cm sq (0.4  cm sq/m sq BSA).                  Mild aortic insufficiency.                  Mild-moderate centrally directed mitral insufficiency.                  No pericardial effusion.                   POST-PROCEDURE FINDINGS                   Normal left ventricular systolic function and regional wall                  motion. Estimated LVEF 60%                  Well deployed stent valve (TAVR).                   Aortic valve peak gradient 4 mm Hg, mean gradient 3 mm Hg,                  dimensionless index 0.5, calculated valve area 2.26 cm sq (1.13                  cm sq/m sq BSA), acceleration time 80 ms.                  No aortic  insufficiency or perivalvular leak.                  No pericardial effusion. IMPRESSIONS  1. Left ventricular ejection fraction, by estimation, is 50 to 55%. The left ventricle has low normal function. The left ventricle demonstrates global hypokinesis. Left ventricular diastolic parameters are consistent with Grade II diastolic dysfunction (pseudonormalization). Elevated left atrial pressure.  2. The mitral valve is degenerative. Mild mitral valve regurgitation. Mild mitral stenosis. The mean mitral valve gradient is 3.5 mmHg with average heart rate of 66 bpm. Severe mitral annular calcification.  3. Pre TAVR: severe aortic stenosis     Post TAVR: Well positioned 61m Edwards Sapien 3. No PVL visualized post deployment. AVA, by VTI measures 2.26 cm2. Procedure Date: 06/02/2022. Aortic valve area, by VTI measures 1.92 cm. Aortic valve mean gradient measures 3.0 mmHg. Aortic valve Vmax measures 1.02 m/s. FINDINGS  Left Ventricle: Left ventricular ejection fraction, by estimation, is 50 to 55%. The left ventricle has low normal function. The left ventricle demonstrates global hypokinesis. Left ventricular diastolic parameters are consistent with Grade II diastolic  dysfunction (pseudonormalization). Elevated left atrial pressure. Mitral Valve: The mitral valve is degenerative in appearance. There is moderate thickening of the mitral valve leaflet(s). There is severe calcification of the mitral valve leaflet(s). Severe mitral annular calcification. Mild mitral valve regurgitation.  Mild mitral valve stenosis. MV peak gradient, 14.1 mmHg. The mean mitral valve gradient is 3.5 mmHg with average heart rate of 66 bpm. Aortic Valve: Pre TAVR The aortic valve has been repaired/replaced. There is severe calcifcation of the aortic valve. There is severe thickening of the aortic valve. Aortic valve regurgitation is not visualized. No aortic stenosis is present. Aortic valve mean gradient measures 3.0 mmHg. Aortic valve peak  gradient measures 4.2 mmHg. Aortic valve area, by VTI measures 1.92 cm. Post TAVR: Well positioned 292mEdwards Sapien 3 valve mean gradient of 3 max gradient of 4 mmHG. No PVL visualized post deployment. AVA 2.26 cm2.  There is severe calcifcation of the aortic valve. There is severe thickening of the aortic valve. There is a 29 mm Sapien prosthetic, stented (TAVR) valve present in the aortic position. Procedure Date: 06/02/2022. Echo findings are consistent with normal structure and function of the aortic valve prosthesis. Aorta: The aortic root and ascending aorta are structurally normal, with no evidence of dilitation. Additional Comments: Spectral Doppler performed. Color Doppler performed.  LEFT VENTRICLE PLAX 2D LVOT diam:     2.40 cm LV SV:         54 LV SV Index:   27 LVOT Area:     4.52 cm  AORTIC VALVE AV Area (Vmax):    2.26 cm AV Area (Vmean):   1.92 cm AV Area (VTI):     1.92 cm AV Vmax:           102.00 cm/s AV Vmean:          78.700 cm/s AV VTI:            0.283 m AV Peak Grad:      4.2 mmHg AV Mean Grad:      3.0 mmHg LVOT Vmax:         51.00 cm/s LVOT Vmean:        33.400 cm/s LVOT VTI:          0.120 m LVOT/AV VTI ratio: 0.42 MITRAL VALVE MV Area VTI:  1.15 cm   SHUNTS MV Peak grad: 14.1 mmHg  Systemic VTI:  0.12 m MV Mean grad: 3.5 mmHg   Systemic Diam: 2.40 cm MV Vmax:      1.88 m/s MV Vmean:     80.8 cm/s Dani Gobble Croitoru MD Electronically signed by Sanda Klein MD Signature Date/Time: 06/02/2022/9:40:28 AM    Final    Structural Heart Procedure  Result Date: 06/02/2022 See surgical note for result.   Cardiac Studies   [facility]  TAVR OPERATIVE NOTE     Date of Procedure:                06/02/2022   Preoperative Diagnosis:      Severe Aortic Stenosis    Postoperative Diagnosis:    Same    Procedure:        Transcatheter Aortic Valve Replacement - Transfemoral Approach             Edwards Sapien 3 THV (size 29 mm, model # [id number], serial # [id number])               Co-Surgeons:                        Lauree Chandler, MD and Gilford Raid , MD    Anesthesiologist:                  Kalman Shan   Echocardiographer:              Croitoru   Pre-operative Echo Findings: Severe aortic stenosis Normal left ventricular systolic function   Post-operative Echo Findings: No paravalvular leak Normal left ventricular systolic function   Echocardiogram 06/03/22: Pending   Patient Profile     Matthew [name] is a 76 y.o. male with a history of CAD s/p 3V CABG (2011), obesity, testicular cancer s/p surgery and radiation, HLD, atrial fib/flutter s/p ablation x3, mitral regurgitation/stenosis, IDDM, and severe aortic stenosis who presented to [facility] on 06/02/22 for planned TAVR.    Assessment & Plan  Severe AS: s/p successful TAVR with a 29 mm Edwards Sapien 3 THV via the TF approach on 06/02/22. Post operative echo pending. Groin sites are stable. ^^^ECG with NSR and no high grade heart block. Continue ASA and plavix. Discontinue central line and transfer to the floor. Early mobilization and hopeful discharge home tomorrow. LVEDP: 20 mmHg   Symptomatic high grade AV block: Noted to have an episode overnight of symptomatic AV block with dizziness and near syncope. Rates in the 20's. HR spontaneously returned to NSR. Pacing pads on. No recurrence. EP consulted for PPM placement.    CAD s/p 3V CABG (2011): Denies anginal symptoms. Continue current regimen. Pre TAVR cath with patent grafts.    Atrial flutter: s/p multiple ablations and followed by Dr. Curt Bears. No longer on anticoagulation    IDDM: SSI while inpatient and resume home medications at discharge.    MR/stenosis: Mild on recent TEE. Continue to follow with surveillance imaging.    Incidental findings: Small solid pulmonary nodule of the left upper lobe measuring 3 mm. No follow-up needed if patient is low-risk. Non-contrast chest CT can be considered in 12 months if patient is high-risk. Will discuss  at [facility] follow up with myself.    Elevated RAISE score: Cardiac Amyloid labs drawn while inpatient. Depending on results, may order outpatient PYP imaging.    Signed, Kathyrn Drown, NP  06/03/2022, 8:57 AM  Pager [phone]   I have personally seen and examined this patient. I agree with the assessment and plan as outlined above.  Complete heart block overnight. Sinus this am. Groins stable. BP stable.  He has been seen by our EP team and will need a permanent pacemaker today.  Likely d/c home tomorrow.   Lauree Chandler, MD, [facility] 06/03/2022 2:28 PM

## 2022-06-03 NOTE — Progress Notes (Signed)
  Echocardiogram 2D Echocardiogram has been performed.  Bobbye Charleston 06/03/2022, 8:45 AM

## 2022-06-03 NOTE — Plan of Care (Signed)

## 2022-06-03 NOTE — H&P (View-Only) (Signed)
Cardiology Consultation   Patient ID: Matthew [name] MRN: [id number]; DOB: 1945-05-18  Admit date: 06/02/2022 Date of Consult: 06/03/2022  PCP:  Wendie Agreste, MD   [facility] Cardiologist:  Murrell Dome Meredith Leeds, MD  Electrophysiologist:  [name], MD  {    Patient Profile:   Matthew [name] is a 77 y.o. male with a hx of CAD(CABG 2011), AFlutter ablated/AFib, recurrent AFlutter,  DM, prostate Ca (hx of extensive XRT >> chronic back pain), RBBB, VHD w/severe AS   who is being seen 06/03/2022 for the evaluation of post TAVR intermittent CHB at the request of Dr. Angelena Form.  AFlutter Hx: Ablation of typical AFlutter 10/31/14 EPS 11/20/15:  LA flutter > DCCV EPS/PVI ablation 09/17/2016 AAD Hx: July 2017 started on amiodarone > stopped April 2018 Hx of QT prolongation on Tikosyn  Off a/c >> patient preference post ablation  History of Present Illness:   Matthew [name] was admitted yesterday for TAVR procedure that was done without immediate complications. Edwards Sapien 3 THV (size 29 mm)   Limited TTE post procedure LVEF 50-55% Post TAVR: Well positioned 41m Edwards Sapien 3. No PVL visualized  post deployment. AVA, by VTI measures 2.26 cm2.  Procedure Date:  06/02/2022. Aortic valve area, by VTI measures 1.92 cm. Aortic valve mean  gradient measures 3.0 mmHg. Aortic valve  Vmax measures 1.02 m/s.  findings are consistent with normal structure and function of the aortic valve prosthesis   Last night developed CHB.  LABS K+ 4.6 Mag 1.9 BUN/Creat 18/1.17 WBC 7.9 H/H 11,4/34.2 (baseline) Plts 190  patient denies any CP, SOB.  He was awake last night and well aware/symptomatic with his heart block, pre-syncopal. Feels well this AM  Home meds (nodal agents) Cosopt (timolol) No others No nodal blocking agents here  Past Medical History:  Diagnosis Date   Anemia    Arthritis    "back, left ankle" (11/20/2015)   Cataract     CHF (congestive heart failure) (HCC)    Chronic lower back pain    Coronary artery disease    a. 05/2009 CABG x 3: LIMA->LAD, VG->OM, VG->PDA; b. Nuc 01/2014: inf-lat scar but no ischemia, EF 57%.   Diabetic retinopathy (HCC)    mild- Dr. HHerbert Deaner  Glaucoma    Hyperlipidemia    a. Intolerant of lipitor and vytorin.   Lyme disease    Migraine    "once or twice" (11/20/2015)   Myocardial infarction ([facility]    'saw evidence of it on an EKG done in 2005"   Paroxysmal atrial flutter ([facility]    a. 01/2014 s/p DCCV;  b. CHA2DS2VASc = 3-->Eliquis.   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 McAngelena Formnd Dr. BaCyndia Bent Sacral pain    "right"   Sciatica of right side    Testicular cancer ([facility]   "heavy doses of radiation; it was stage 2"   Type II diabetes mellitus ([facility]   Valvular heart disease    a. 01/2014 Echo: Ef 60-65%, no rwma, mild AI/MS, mod MR, mildly dil LA.    Past Surgical History:  Procedure Laterality Date   ABDOMINAL EXPLORATION SURGERY  ~ 2005   "for hernia, but didn't have one"   ATRIAL FIBRILLATION ABLATION N/A 09/17/2016   Procedure: Atrial Fibrillation Ablation;  Surgeon: CaConstance HawMD;  [facility];  Service: Cardiovascular;  Laterality: N/A;   CARDIAC CATHETERIZATION  2011   CARDIOVERSION N/A 02/19/2014   Procedure: CARDIOVERSION;  Surgeon: [name], MD;  Location: [facility] ENDOSCOPY;  Service: Cardiovascular;  Laterality: N/A;   CARDIOVERSION N/A 10/28/2015   Procedure: CARDIOVERSION;  Surgeon: [name], MD;  Location: [place];  Service: Cardiovascular;  Laterality: N/A;   CARDIOVERSION  11/20/2015   "200 joules"   CORONARY ARTERY BYPASS GRAFT  05/2009   LIMA-LAD, SVG-OM, SVG-PDA 06/20/09   ELBOW FRACTURE SURGERY Left    broken ulna on left elbow-surgical repair   ELECTROPHYSIOLOGIC STUDY N/A 10/31/2014   Procedure: A-Flutter;  Surgeon: [name], MD;  Location: [place] [facility] LAB;  Service: Cardiovascular;  Laterality: N/A;   ELECTROPHYSIOLOGIC STUDY N/A 11/20/2015   Procedure: A-Flutter Ablation;  Surgeon: [name], MD;  Location: [place] [facility];  Service: Cardiovascular;  Laterality: N/A;   EYE SURGERY     FRACTURE SURGERY     RIGHT/LEFT HEART CATH AND CORONARY/GRAFT ANGIOGRAPHY N/A 04/13/2022   Procedure: RIGHT/LEFT HEART CATH AND CORONARY/GRAFT ANGIOGRAPHY;  Surgeon: Burnell Blanks, MD;  Location: [place] [facility];  Service: Cardiovascular;  Laterality: N/A;   TEE WITHOUT CARDIOVERSION N/A 03/13/2022   Procedure: TRANSESOPHAGEAL ECHOCARDIOGRAM (TEE);  Surgeon: Sanda Klein, MD;  Location: [place];  Service: Cardiovascular;  Laterality: N/A;   TESTICLE REMOVAL Right 1982   TONSILLECTOMY  ~ [place] Medications:  Prior to Admission medications   Medication Sig Start Date End Date Taking? Authorizing Provider  aspirin EC 81 MG tablet Take 81 mg by mouth in the morning. Swallow whole.   Yes [provider]  Aspirin-Caffeine (BAYER BACK & BODY) 500-32.5 MG TABS Take 1 tablet by mouth daily as needed (pain).   Yes [provider]  dorzolamide-timolol (COSOPT) 22.3-6.8 MG/ML ophthalmic solution Place 1 drop into the left eye 2 (two) times daily. 10/01/21  Yes [provider]  furosemide (LASIX) 40 MG tablet Take 1 tablet (40 mg total) by mouth daily as needed. Patient taking differently: Take 40 mg by mouth daily as needed for fluid. 08/14/21  Yes Enes Rokosz Hassell Done, MD  insulin aspart (NOVOLOG) 100 UNIT/ML injection Inject 18-20 Units into the skin 3 (three) times daily with meals. Patient taking differently: Inject 15-20 Units into the skin 3 (three) times daily with meals. Sliding scale 03/16/22  Yes Elayne Snare, MD  latanoprost (XALATAN) 0.005 % ophthalmic solution Place 1 drop into the left eye at bedtime. 04/01/21  Yes [provider]  lisinopril (ZESTRIL) 5  MG tablet TAKE 1 TABLET BY MOUTH EVERY DAY 02/16/22  Yes Annett Boxwell, Ocie Doyne, MD  Metoprolol Succinate 50 MG CS24 Take 1/2 tablet by mouth in the morning and 1/2 tablet at bedtime (Total 50 mg ) 01/19/22  Yes Altha Sweitzer Hassell Done, MD  NOVOLIN N 100 UNIT/ML injection INJECT 22 UNITS IN THE SKIN DAILY AT BEDTIME. Patient taking differently: 20 Units at bedtime. 05/04/22  Yes Elayne Snare, MD  potassium chloride (KLOR-CON 10) 10 MEQ tablet Take 1 tablet (10 mEq total) by mouth as needed. With Lasix Patient taking differently: Take 10 mEq by mouth daily as needed (when taking lasix). 11/03/21  Yes Baldwin Jamaica, PA-C  Continuous Blood Gluc Receiver (DEXCOM G6 RECEIVER) DEVI Use to receive data from sensor 05/23/21   Elayne Snare, MD  Continuous Blood Gluc Sensor (DEXCOM G6 SENSOR) MISC Use to monitor blood sugar, change after 10 days 05/23/21   Dwyane Dee,  Vicenta Aly, MD  Insulin Syringe-Needle U-100 (BD VEO INSULIN SYRINGE U/F) 31G X 15/64" 0.5 ML MISC Use to inject 4 times a day 08/11/21   Elayne Snare, MD    Inpatient Medications: Scheduled Meds:  aspirin EC  81 mg Oral q AM   atropine       dorzolamide-timolol  1 drop Left Eye BID   insulin aspart  0-24 Units Subcutaneous Q4H   sodium chloride flush  3 mL Intravenous Q12H   Continuous Infusions:  sodium chloride     sodium chloride     nitroGLYCERIN     PRN Meds: sodium chloride, acetaminophen **OR** acetaminophen, atropine, ondansetron (ZOFRAN) IV, oxyCODONE, sodium chloride flush  Allergies:    Allergies  Allergen Reactions   Actos [Pioglitazone] Swelling   Bee Venom Swelling   Codeine Hives and Nausea Only   Metformin Nausea Only   Statins Other (See Comments)    Muscle pain, fatigue   Glucophage [Metformin Hcl] Nausea Only   Raspberry Itching    Social History:   Social History   Socioeconomic History   Marital status: Married    Spouse name: Not on file   Number of children: 0   Years of education: Not on file   Highest education  level: Not on file  Occupational History   Occupation: Ambulance person  Tobacco Use   Smoking status: Former    Packs/day: 0.50    Years: 15.00    Total pack years: 7.50    Types: Cigarettes    Quit date: 1990    Years since quitting: 34.1   Smokeless tobacco: Never   Tobacco comments:    "quit smoking cigarettes in the 1980's; don't know how much or for how long"  Vaping Use   Vaping Use: Never used  Substance and Sexual Activity   Alcohol use: Yes    Alcohol/week: 3.0 standard drinks of alcohol    Types: 3 Cans of beer per week   Drug use: No    Comment: "quit smoking pot in the 1990s"   Sexual activity: Not Currently    Birth control/protection: Coitus interruptus  Other Topics Concern   Not on file  Social History Narrative   Not on file   Social Determinants of Health   Financial Resource Strain: Not on file  Food Insecurity: No Food Insecurity (03/28/2022)   Hunger Vital Sign    Worried About Running Out of Food in the Last Year: Never true    Ran Out of Food in the Last Year: Never true  Transportation Needs: No Transportation Needs (03/28/2022)   PRAPARE - Hydrologist (Medical): No    Lack of Transportation (Non-Medical): No  Physical Activity: Not on file  Stress: Not on file  Social Connections: Not on file  Intimate Partner Violence: Not At Risk (03/28/2022)   Humiliation, Afraid, Rape, and Kick questionnaire    Fear of Current or Ex-Partner: No    Emotionally Abused: No    Physically Abused: No    Sexually Abused: No    Family History:   Family History  Problem Relation Age of Onset   Diabetes Mother    Heart disease Mother        Heart murmur   Heart disease Father    Heart attack Father    Diabetes Father    Hypertension Father    Diabetes Sister      ROS:  Please see the history of present illness.  All  other ROS reviewed and negative.     Physical Exam/Data:   Vitals:   06/02/22 2024 06/02/22 2300  06/03/22 0410 06/03/22 0700  BP: (!) 195/83 (!) 148/62 (!) 167/58 (!) 168/67  Pulse: 92 82 98 (!) 101  Resp: '20 16 17 18  '$ Temp: 98.6 F (37 C) 98.9 F (37.2 C) 98.5 F (36.9 C) 98.5 F (36.9 C)  TempSrc: Oral Oral Oral Oral  SpO2: 99% 97% 98% 98%  Weight:      Height:        Intake/Output Summary (Last 24 hours) at 06/03/2022 0817 Last data filed at 06/03/2022 0756 Gross per 24 hour  Intake 700 ml  Output 720 ml  Net -20 ml      06/02/2022    5:59 AM 05/20/2022   11:50 AM 04/13/2022    6:06 AM  Last 3 Weights  Weight (lbs) 200 lb 196 lb 196 lb  Weight (kg) 90.719 kg 88.905 kg 88.905 kg     Body mass index is 32.28 kg/m.  General:  Well nourished, well developed, in no acute distress HEENT: normal Neck: no JVD Vascular: No carotid bruits; Distal pulses 2+ bilaterally Cardiac:  RRR; no murmurs, gallops or rubs Lungs:  CTA b/l, no wheezing, rhonchi or rales  Abd: soft, nontender Ext: 1+ edema b/l Musculoskeletal:  No deformities Skin: warm and dry  Neuro:  no gross focal motor abnormalities noted Psych:  Normal affect   EKG:  The EKG was personally reviewed and demonstrates:    SB 57bpm, PACs, RBBB (152), 1st degree AVblock 265m  OLD 05/29/22: SR 93bpm, RBBB, 2st degree AVblock 2460m(manually measured) 03/27/22: SR 72bpm, RBBB, 1st degree AVBlock 26426m  Telemetry:  Telemetry was personally reviewed and demonstrates:   SR 70's-90's Last evening episodes of CHB with rates towards 30bpm and V standstill of 14 seconds    Relevant CV Studies:  06/02/22: limited TTE 1. Left ventricular ejection fraction, by estimation, is 50 to 55%. The  left ventricle has low normal function. The left ventricle demonstrates  global hypokinesis. Left ventricular diastolic parameters are consistent  with Grade II diastolic dysfunction  (pseudonormalization). Elevated left atrial pressure.   2. The mitral valve is degenerative. Mild mitral valve regurgitation.  Mild mitral stenosis.  The mean mitral valve gradient is 3.5 mmHg with  average heart rate of 66 bpm. Severe mitral annular calcification.   3. Pre TAVR: severe aortic stenosis      Post TAVR: Well positioned 36m56mwards Sapien 3. No PVL visualized  post deployment. AVA, by VTI measures 2.26 cm2. Procedure Date:  06/02/2022. Aortic valve area, by VTI measures 1.92 cm. Aortic valve mean  gradient measures 3.0 mmHg. Aortic valve  Vmax measures 1.02 m/s.    04/13/22; LHC Prox RCA lesion is 100% stenosed.   Ost Cx to Prox Cx lesion is 100% stenosed.   Ost LM to Mid LM lesion is 90% stenosed.   Mid LAD lesion is 100% stenosed.   SVG graft was visualized by angiography and is normal in caliber.   SVG graft was visualized by angiography and is normal in caliber.   LIMA graft was visualized by angiography and is normal in caliber.   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 Patent LIMA to mid LAD Chronic occlusion proximal Circumflex. Patent vein graft to the obtuse marginal branch Chronic occlusion proximal RCA. Patent vein graft to the  PDA filling the posterolateral artery as well as the distal RCA Severe low flow/low gradient aortic stenosis by echo. The TEE was reviewed by our structural heart team and the consensus was that the stenosis was severe.  (Cath data: peak to peak gradient 18.8 mmHg, Mean gradient 16.4 mmHg).  Recommendations: Johnnie Goynes continue planning for TAVR. We Keaundre Thelin arrange his CT scans and then plan a visit with one of the CT surgeons on our TAVR team.    03/13/22: TEE 1. Left ventricular ejection fraction, by estimation, is 55 to 60%. The  left ventricle has normal function. The left ventricle has no regional  wall motion abnormalities. There is mild concentric left ventricular  hypertrophy. Left ventricular diastolic  parameters are consistent with Grade II diastolic dysfunction  (pseudonormalization). Elevated  left atrial pressure.   2. Right ventricular systolic function is normal. The right ventricular  size is normal. There is normal pulmonary artery systolic pressure. The  estimated right ventricular systolic pressure is 65.6 mmHg.   3. Left atrial size was moderately dilated. No left atrial/left atrial  appendage thrombus was detected.   4. Right atrial size was moderately dilated.   5. The basal two-thirds of the anterior mitral leaflet are heavily  calcified and almost motionless, whereas the posterior leaflet has  preserved mobility. The mitral valve is degenerative. Mild to moderate  mitral valve regurgitation. Mild mitral  stenosis. The mean mitral valve gradient is 4.0 mmHg with average heart  rate of 77 bpm. Moderate mitral annular calcification.   6. The aortic valve is tricuspid. There is severe calcifcation of the  aortic valve. There is severe thickening of the aortic valve. Aortic valve  regurgitation is trivial. Moderate to severe aortic valve stenosis.   7. There is Moderate (Grade [name]) protruding plaque involving the  descending aorta and aortic arch.   Conclusion(s)/Recommendation(s): The degenerative changes of the mitral  and aortic valves and in particular the appearance of the anterior mitral  leaflet, is strongly suggestive of sequelae of previous radiation therapy.    11/10/21: TTE 1. Left ventricular ejection fraction, by estimation, is 55 to 60%. The  left ventricle has normal function. The left ventricle has no regional  wall motion abnormalities. There is mild left ventricular hypertrophy.  Left ventricular diastolic parameters  are consistent with Grade II diastolic dysfunction (pseudonormalization).  Elevated left atrial pressure.   2. Right ventricular systolic function is normal. The right ventricular  size is normal.   3. Left atrial size was mildly dilated.   4. Right atrial size was mild to moderately dilated.   5. MV is thickened, difficult to see  leaflets well Peak and mean  gradients through the valve are 14 and 4 mm Hg respectively. 2D images  suggest mitral valve is severely narrowed. Pressure half time valve area  is calculated at 2.64 cm2, probably an over  estimate due to increased filling pressures. Calculation of valve with  continuity equation not accurate due to inablity to accurately measure  LVOT. Would recomm TEE to further evaluate valve. Moderate mitral valve  regurgitation.   6. AV is thickened, calcified Peak and mean gradients through the valve  are 45 and 26 mm Hg respectively. 2 D imaging it appears to be severely  narrowed. Due to calcification of mitral annulus, LVOT measurement is not  accurate. Would recomm another  imaging modality like TEE to define Aortic valve regurgitation is mild.   7. The inferior vena cava is normal in size with greater  than 50%  respiratory variability, suggesting right atrial pressure of 3 mmHg.   Comparison(s): 08/21/16 EF 55-60%.     09/17/2016: EPS/ablation CONCLUSIONS: 1. Sinus rhythm upon presentation.   2. Successful electrical isolation and anatomical encircling of all four pulmonary veins with radiofrequency current. 3. No inducible arrhythmias following ablation both on and off of Isuprel 4. No early apparent complications.   08/21/2016: TTE Study Conclusions  - Left ventricle: The cavity size was normal. Wall thickness was    increased in a pattern of mild LVH. Systolic function was normal.    The estimated ejection fraction was in the range of 55% to 60%.    Wall motion was normal; there were no regional wall motion    abnormalities. Features are consistent with a pseudonormal left    ventricular filling pattern, with concomitant abnormal relaxation    and increased filling pressure (grade 2 diastolic dysfunction).    Doppler parameters are consistent with high ventricular filling    pressure.  - Mitral valve: Severely calcified annulus. There was mild     regurgitation.  - Left atrium: The atrium was mildly dilated.   Impressions:  - Normal LV systolic function; moderate diastolic dysfunction with    elevated LV filling pressure; mild MR; mild LAE.      02/05/14: TTE Study Conclusions - Left ventricle: The cavity size was normal. There was mild focal   basal hypertrophy of the septum. Systolic function was normal.   The estimated ejection fraction was in the range of 60% to 65%.   Wall motion was normal; there were no regional wall motion   abnormalities. - Aortic valve: Valve mobility was restricted. There was mild   regurgitation. Mean gradient (S): 7 mm Hg. Peak gradient (S): 12   mm Hg. Valve area (VTI): 2.03 cm^2. Valve area (Vmean): 2.02   cm^2. - Mitral valve: Calcified annulus. Mildly thickened leaflets . The   findings are consistent with mild stenosis. There was moderate   regurgitation. Valve area by continuity equation (using LVOT   flow): 2.29 cm^2. - Left atrium: The atrium was mildly dilated. - Right ventricle: Systolic function was mildly reduced. - Right atrium: The atrium was mildly dilated. Impressions: - Normal LV function; mild biatrial enlargement; thickened MV;   moderate MR; mild MS by mean gradient (6 mmHg); calcified aortic   valve but no significant AS by doppler; mild AI.   11/20/2015: EPS 1. LA flutter 2. Successful DCCV with 200 Joules of biphasic energy.   10/31/2014: EPS/ablation CONCLUSIONS:  1. Isthmus-dependent right atrial flutter upon presentation.  2. Successful radiofrequency ablation of atrial flutter along the cavotricuspid isthmus with complete bidirectional isthmus block achieved.  3. No inducible arrhythmias following ablation.  4. No early apparent complications.    02/06/14: stress myoview Impression Exercise Capacity:  Poor exercise capacity. BP Response:  Normal blood pressure response. Clinical Symptoms:  There is dyspnea. ECG Impression:  A-fib with RVR at baseline and with  mild exercise. Comparison with Prior Nuclear Study: No previous nuclear study performed Overall Impression:  Intermediate risk stress nuclear study with large-sized (Extent 18%), severe intensity fixed inferolateral defect consitent with LCx territory scar. LV Ejection Fraction: 57%.  LV Wall Motion:  inferolateral akinesis Laboratory Data:  High Sensitivity Troponin:  No results for input(s): "TROPONINIHS" in the last 720 hours.   Chemistry Recent Labs  Lab 05/29/22 0830 06/02/22 0748 06/02/22 0845 06/02/22 0941 06/03/22 0146  NA 139   < > 140 140 137  K 4.6   < > 4.4 4.5 4.6  CL 103   < > 102 104 102  CO2 28  --   --   --  24  GLUCOSE 208*   < > 237* 198* 214*  BUN 19   < > '19 20 18  '$ CREATININE 1.16   < > 1.00 1.10 1.17  CALCIUM 8.9  --   --   --  8.5*  MG  --   --   --   --  1.9  GFRNONAA >60  --   --   --  >60  ANIONGAP 8  --   --   --  11   < > = values in this interval not displayed.    Recent Labs  Lab 05/29/22 0830  PROT 6.5  ALBUMIN 3.5  AST 23  ALT 15  ALKPHOS 78  BILITOT 0.6   Lipids No results for input(s): "CHOL", "TRIG", "HDL", "LABVLDL", "LDLCALC", "CHOLHDL" in the last 168 hours.  Hematology Recent Labs  Lab 05/29/22 0830 06/02/22 0748 06/02/22 0845 06/02/22 0941 06/03/22 0146  WBC 7.3  --   --   --  7.9  RBC 4.20*  --   --   --  3.87*  HGB 12.3*   < > 9.9* 10.5* 11.4*  HCT 37.7*   < > 29.0* 31.0* 34.2*  MCV 89.8  --   --   --  88.4  MCH 29.3  --   --   --  29.5  MCHC 32.6  --   --   --  33.3  RDW 13.4  --   --   --  13.4  PLT 248  --   --   --  190   < > = values in this interval not displayed.   Thyroid No results for input(s): "TSH", "FREET4" in the last 168 hours.  BNPNo results for input(s): "BNP", "PROBNP" in the last 168 hours.  DDimer No results for input(s): "DDIMER" in the last 168 hours.   Radiology/Studies:     Assessment and Plan:   CHB POD #1 TAVR Baseline conduction system disease with pre-procedure RBBB, 1st degree  AVBlock No nodal blocking agents He Marcelles Clinard need PPM Discussed rational for PPM and the implant procedure, potential risks/benefits He is agreeable to proceed  Dr. Curt Bears is aware, Marguetta Windish see him later this AM Pacer pads are on the patient  Further with attending team   Risk Assessment/Risk Scores:     For questions or updates, please contact [place] Please consult [email or web address] for contact info under    Signed, Baldwin Jamaica, PA-C  06/03/2022 8:17 AM  I have seen and examined this patient with [name].  Agree with above, note added to reflect my findings.  Patient is postop day 1 from TAVR.  Procedure went well.  Overnight last night, he began to have long pauses of up to 14 seconds.  He has a baseline right bundle branch block.  Today he feels well.  No acute complaints.  GEN: Well nourished, well developed, in no acute distress  HEENT: normal  Neck: no JVD, carotid bruits, or masses Cardiac: RRR; no murmurs, rubs, or gallops,no edema  Respiratory:  clear to auscultation bilaterally, normal work of breathing GI: soft, nontender, nondistended, + BS MS: no deformity or atrophy  Skin: warm and dry Neuro:  Strength and sensation are intact Psych: euthymic mood, full affect   Complete heart block: Has intermittent episodes.  Has a right bundle branch block and with TAVR, Kirkland Figg likely need pacemaker implant.  Risk and benefits have been discussed.  Risk of bleeding, tamponade, infection, pneumothorax, lead dislodgment.  He understands these risks and is agreed to the procedure. Severe aortic stenosis: Status post TAVR Coronary artery disease: Status post CABG.  Rosena Bartle M. Jonie Burdell MD 06/03/2022 10:51 AM

## 2022-06-03 NOTE — Progress Notes (Signed)
Mobility Specialist Progress Note:   06/03/22 1009  Mobility  Activity Ambulated with assistance in hallway  Level of Assistance Standby assist, set-up cues, supervision of patient - no hands on  Assistive Device Front wheel walker  Distance Ambulated (ft) 500 ft  Activity Response Tolerated well  Mobility Referral Yes  $Mobility charge 1 Mobility   Pt received in bed and agreeable. C/o nausea and dizziness, not worsened with ambulation. Pt returned to bed with all needs met, call bell in reach, and wife in room.   Andrey Campanile Mobility Specialist Please contact via SecureChat or  Rehab office at [phone]

## 2022-06-03 NOTE — Interval H&P Note (Signed)
History and Physical Interval Note:  06/03/2022 10:53 AM  Matthew [name]  has presented today for surgery, with the diagnosis of heart block.  The various methods of treatment have been discussed with the patient and family. After consideration of risks, benefits and other options for treatment, the patient has consented to  Procedure(s): PACEMAKER IMPLANT (N/A) as a surgical intervention.  The patient's history has been reviewed, patient examined, no change in status, stable for surgery.  I have reviewed the patient's chart and labs.  Questions were answered to the patient's satisfaction.     Penelopi Mikrut [employer]

## 2022-06-03 NOTE — Progress Notes (Signed)
Pt came back to rm 24 from cath lab. Reinitiated tele. Vss. Call bell within reach.   Lavenia Atlas, RN

## 2022-06-04 ENCOUNTER — Encounter (HOSPITAL_COMMUNITY): Payer: Self-pay | Admitting: Cardiology

## 2022-06-04 ENCOUNTER — Inpatient Hospital Stay (HOSPITAL_COMMUNITY): Payer: Medicare HMO

## 2022-06-04 DIAGNOSIS — I4892 Unspecified atrial flutter: Secondary | ICD-10-CM | POA: Diagnosis not present

## 2022-06-04 DIAGNOSIS — J811 Chronic pulmonary edema: Secondary | ICD-10-CM | POA: Diagnosis not present

## 2022-06-04 DIAGNOSIS — R112 Nausea with vomiting, unspecified: Secondary | ICD-10-CM | POA: Insufficient documentation

## 2022-06-04 DIAGNOSIS — Z006 Encounter for examination for normal comparison and control in clinical research program: Secondary | ICD-10-CM | POA: Diagnosis not present

## 2022-06-04 DIAGNOSIS — I350 Nonrheumatic aortic (valve) stenosis: Secondary | ICD-10-CM | POA: Diagnosis not present

## 2022-06-04 DIAGNOSIS — R1110 Vomiting, unspecified: Secondary | ICD-10-CM | POA: Diagnosis not present

## 2022-06-04 DIAGNOSIS — I442 Atrioventricular block, complete: Secondary | ICD-10-CM | POA: Diagnosis not present

## 2022-06-04 LAB — CBC
HCT: 34.3 % — ABNORMAL LOW (ref 39.0–52.0)
Hemoglobin: 11.2 g/dL — ABNORMAL LOW (ref 13.0–17.0)
MCH: 29 pg (ref 26.0–34.0)
MCHC: 32.7 g/dL (ref 30.0–36.0)
MCV: 88.9 fL (ref 80.0–100.0)
Platelets: 179 10*3/uL (ref 150–400)
RBC: 3.86 MIL/uL — ABNORMAL LOW (ref 4.22–5.81)
RDW: 13.2 % (ref 11.5–15.5)
WBC: 9.5 10*3/uL (ref 4.0–10.5)
nRBC: 0 % (ref 0.0–0.2)

## 2022-06-04 LAB — BASIC METABOLIC PANEL
Anion gap: 11 (ref 5–15)
BUN: 17 mg/dL (ref 8–23)
CO2: 24 mmol/L (ref 22–32)
Calcium: 8.6 mg/dL — ABNORMAL LOW (ref 8.9–10.3)
Chloride: 99 mmol/L (ref 98–111)
Creatinine, Ser: 1.16 mg/dL (ref 0.61–1.24)
GFR, Estimated: >60 mL/min (ref >60)
Glucose, Bld: 226 mg/dL — ABNORMAL HIGH (ref 70–99)
Potassium: 4.2 mmol/L (ref 3.5–5.1)
Sodium: 134 mmol/L — ABNORMAL LOW (ref 135–145)

## 2022-06-04 LAB — IMMUNOFIXATION, URINE

## 2022-06-04 LAB — GLUCOSE, CAPILLARY: Glucose-Capillary: 327 mg/dL — ABNORMAL HIGH (ref 70–99)

## 2022-06-04 MED ORDER — METOPROLOL SUCCINATE ER 50 MG PO TB24
50.0000 mg | ORAL_TABLET | Freq: Every day | ORAL | Status: DC
  Administered 2022-06-04: 50 mg via ORAL
  Filled 2022-06-04: qty 1

## 2022-06-04 MED FILL — Lidocaine HCl Local Preservative Free (PF) Inj 1%: INTRAMUSCULAR | Qty: 30 | Status: AC

## 2022-06-04 NOTE — Progress Notes (Signed)
D/c tele and IV. Went over AVS with pt and all questions were addressed.   Lavenia Atlas, RN

## 2022-06-04 NOTE — Progress Notes (Addendum)
Rounding Note    Patient Name: Matthew [name] Date of Encounter: 06/04/2022  [place] Cardiologist: Lela Murfin Meredith Leeds, MD   Subjective   Nauseous, vomiting overnight, (dark in color), not bloody or bilious, + hiccups  Inpatient Medications    Scheduled Meds:  aspirin EC  81 mg Oral q AM   dorzolamide-timolol  1 drop Left Eye BID   insulin aspart  0-24 Units Subcutaneous Q4H   sodium chloride flush  3 mL Intravenous Q12H   sodium chloride flush  3 mL Intravenous Q12H   Continuous Infusions:  sodium chloride     sodium chloride      ceFAZolin (ANCEF) IV 1 g (06/04/22 0516)   nitroGLYCERIN     PRN Meds: sodium chloride, acetaminophen, ondansetron (ZOFRAN) IV, oxyCODONE, sodium chloride flush   Vital Signs    Vitals:   06/03/22 1813 06/03/22 2035 06/03/22 2345 06/04/22 0518  BP: (!) 156/64 (!) 142/61 (!) 161/86 (!) 174/79  Pulse: 89 95 93 (!) 111  Resp: '16 18 18 20  '$ Temp: 98.7 F (37.1 C) 99.3 F (37.4 C) 98.7 F (37.1 C) 99 F (37.2 C)  TempSrc: Oral Oral Oral Oral  SpO2: 97% 93% 97% 97%  Weight:      Height:        Intake/Output Summary (Last 24 hours) at 06/04/2022 0800 Last data filed at 06/04/2022 0530 Gross per 24 hour  Intake 384.28 ml  Output 2100 ml  Net -1715.72 ml      06/02/2022    5:59 AM 05/20/2022   11:50 AM 04/13/2022    6:06 AM  Last 3 Weights  Weight (lbs) 200 lb 196 lb 196 lb  Weight (kg) 90.719 kg 88.905 kg 88.905 kg      Telemetry    ST 110's-130's intermittent tracking by his pacer, no V arrhythmias - Personally Reviewed  ECG    ST/VP 141 - Personally Reviewed  Physical Exam   GEN: No acute physical distress, though verbalizes nausea.   Neck: No JVD is noted Cardiac: RRR, tachycardic, no murmurs, NO rubs, or gallops.  Respiratory: CTA b/l, no absent BS appreciated. GI: Soft, nontender to light palp, non-distended, + BS (normal) all 4 MS: No edema; No deformity. Neuro:  Nonfocal  Psych: Normal affect    PPM site: dressing is CDI, no hematoma  Labs    High Sensitivity Troponin:  No results for input(s): "TROPONINIHS" in the last 720 hours.   Chemistry Recent Labs  Lab 05/29/22 0830 06/02/22 0748 06/02/22 0941 06/03/22 0146 06/04/22 0057  NA 139   < > 140 137 134*  K 4.6   < > 4.5 4.6 4.2  CL 103   < > 104 102 99  CO2 28  --   --  24 24  GLUCOSE 208*   < > 198* 214* 226*  BUN 19   < > '20 18 17  '$ CREATININE 1.16   < > 1.10 1.17 1.16  CALCIUM 8.9  --   --  8.5* 8.6*  MG  --   --   --  1.9  --   PROT 6.5  --   --   --   --   ALBUMIN 3.5  --   --   --   --   AST 23  --   --   --   --   ALT 15  --   --   --   --   ALKPHOS 78  --   --   --   --  BILITOT 0.6  --   --   --   --   GFRNONAA >60  --   --  >60 >60  ANIONGAP 8  --   --  11 11   < > = values in this interval not displayed.    Lipids No results for input(s): "CHOL", "TRIG", "HDL", "LABVLDL", "LDLCALC", "CHOLHDL" in the last 168 hours.  Hematology Recent Labs  Lab 05/29/22 0830 06/02/22 0748 06/02/22 0941 06/03/22 0146 06/04/22 0057  WBC 7.3  --   --  7.9 9.5  RBC 4.20*  --   --  3.87* 3.86*  HGB 12.3*   < > 10.5* 11.4* 11.2*  HCT 37.7*   < > 31.0* 34.2* 34.3*  MCV 89.8  --   --  88.4 88.9  MCH 29.3  --   --  29.5 29.0  MCHC 32.6  --   --  33.3 32.7  RDW 13.4  --   --  13.4 13.2  PLT 248  --   --  190 179   < > = values in this interval not displayed.   Thyroid No results for input(s): "TSH", "FREET4" in the last 168 hours.  BNPNo results for input(s): "BNP", "PROBNP" in the last 168 hours.  DDimer No results for input(s): "DDIMER" in the last 168 hours.   Radiology      Cardiac Studies   06/03/22: TTE 1. Left ventricular ejection fraction, by estimation, is 50 to 55%. The  left ventricle has low normal function. The left ventricle has no regional  wall motion abnormalities. There is mild concentric left ventricular  hypertrophy. Left ventricular  diastolic function could not be evaluated.   2.  Right ventricular systolic function is normal. The right ventricular  size is normal. Tricuspid regurgitation signal is inadequate for assessing  PA pressure.   3. Left atrial size was severely dilated.   4. Right atrial size was mildly dilated.   5. The mitral valve is degenerative. Trivial mitral valve regurgitation.  Mild mitral stenosis. The mean mitral valve gradient is 7.0 mmHg with  average heart rate of 99 bpm. Severe mitral annular calcification.   6. The aortic valve has been repaired/replaced. Aortic valve  regurgitation is not visualized. There is a 29 mm Sapien prosthetic (TAVR)  valve present in the aortic position. Procedure Date: 06/02/2022. Aortic  valve mean gradient measures 9.0 mmHg. Aortic  valve Vmax measures 1.92 m/s. Aortic valve acceleration time measures 74  msec.   7. The inferior vena cava is normal in size with greater than 50%  respiratory variability, suggesting right atrial pressure of 3 mmHg.    06/02/22: limited TTE 1. Left ventricular ejection fraction, by estimation, is 50 to 55%. The  left ventricle has low normal function. The left ventricle demonstrates  global hypokinesis. Left ventricular diastolic parameters are consistent  with Grade II diastolic dysfunction  (pseudonormalization). Elevated left atrial pressure.   2. The mitral valve is degenerative. Mild mitral valve regurgitation.  Mild mitral stenosis. The mean mitral valve gradient is 3.5 mmHg with  average heart rate of 66 bpm. Severe mitral annular calcification.   3. Pre TAVR: severe aortic stenosis      Post TAVR: Well positioned 50m Edwards Sapien 3. No PVL visualized  post deployment. AVA, by VTI measures 2.26 cm2. Procedure Date:  06/02/2022. Aortic valve area, by VTI measures 1.92 cm. Aortic valve mean  gradient measures 3.0 mmHg. Aortic valve  Vmax measures 1.02 m/s.  04/13/22; LHC Prox RCA lesion is 100% stenosed.   Ost Cx to Prox Cx lesion is 100% stenosed.   Ost LM to  Mid LM lesion is 90% stenosed.   Mid LAD lesion is 100% stenosed.   SVG graft was visualized by angiography and is normal in caliber.   SVG graft was visualized by angiography and is normal in caliber.   LIMA graft was visualized by angiography and is normal in caliber.   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 Patent LIMA to mid LAD Chronic occlusion proximal Circumflex. Patent vein graft to the obtuse marginal branch Chronic occlusion proximal RCA. Patent vein graft to the PDA filling the posterolateral artery as well as the distal RCA Severe low flow/low gradient aortic stenosis by echo. The TEE was reviewed by our structural heart team and the consensus was that the stenosis was severe.  (Cath data: peak to peak gradient 18.8 mmHg, Mean gradient 16.4 mmHg).  Recommendations: Erricka Falkner continue planning for TAVR. We Careen Mauch arrange his CT scans and then plan a visit with one of the CT surgeons on our TAVR team.      03/13/22: TEE 1. Left ventricular ejection fraction, by estimation, is 55 to 60%. The  left ventricle has normal function. The left ventricle has no regional  wall motion abnormalities. There is mild concentric left ventricular  hypertrophy. Left ventricular diastolic  parameters are consistent with Grade II diastolic dysfunction  (pseudonormalization). Elevated left atrial pressure.   2. Right ventricular systolic function is normal. The right ventricular  size is normal. There is normal pulmonary artery systolic pressure. The  estimated right ventricular systolic pressure is 53.2 mmHg.   3. Left atrial size was moderately dilated. No left atrial/left atrial  appendage thrombus was detected.   4. Right atrial size was moderately dilated.   5. The basal two-thirds of the anterior mitral leaflet are heavily  calcified and almost motionless, whereas the posterior leaflet has  preserved mobility.  The mitral valve is degenerative. Mild to moderate  mitral valve regurgitation. Mild mitral  stenosis. The mean mitral valve gradient is 4.0 mmHg with average heart  rate of 77 bpm. Moderate mitral annular calcification.   6. The aortic valve is tricuspid. There is severe calcifcation of the  aortic valve. There is severe thickening of the aortic valve. Aortic valve  regurgitation is trivial. Moderate to severe aortic valve stenosis.   7. There is Moderate (Grade [name]) protruding plaque involving the  descending aorta and aortic arch.   Conclusion(s)/Recommendation(s): The degenerative changes of the mitral  and aortic valves and in particular the appearance of the anterior mitral  leaflet, is strongly suggestive of sequelae of previous radiation therapy.      11/10/21: TTE 1. Left ventricular ejection fraction, by estimation, is 55 to 60%. The  left ventricle has normal function. The left ventricle has no regional  wall motion abnormalities. There is mild left ventricular hypertrophy.  Left ventricular diastolic parameters  are consistent with Grade II diastolic dysfunction (pseudonormalization).  Elevated left atrial pressure.   2. Right ventricular systolic function is normal. The right ventricular  size is normal.   3. Left atrial size was mildly dilated.   4. Right atrial size was mild to moderately dilated.   5. MV is thickened, difficult to see leaflets well Peak and mean  gradients through the valve are 14 and 4 mm Hg respectively. 2D  images  suggest mitral valve is severely narrowed. Pressure half time valve area  is calculated at 2.64 cm2, probably an over  estimate due to increased filling pressures. Calculation of valve with  continuity equation not accurate due to inablity to accurately measure  LVOT. Would recomm TEE to further evaluate valve. Moderate mitral valve  regurgitation.   6. AV is thickened, calcified Peak and mean gradients through the valve  are 45 and 26  mm Hg respectively. 2 D imaging it appears to be severely  narrowed. Due to calcification of mitral annulus, LVOT measurement is not  accurate. Would recomm another  imaging modality like TEE to define Aortic valve regurgitation is mild.   7. The inferior vena cava is normal in size with greater than 50%  respiratory variability, suggesting right atrial pressure of 3 mmHg.   Comparison(s): 08/21/16 EF 55-60%.        09/17/2016: EPS/ablation CONCLUSIONS: 1. Sinus rhythm upon presentation.   2. Successful electrical isolation and anatomical encircling of all four pulmonary veins with radiofrequency current. 3. No inducible arrhythmias following ablation both on and off of Isuprel 4. No early apparent complications.   08/21/2016: TTE Study Conclusions  - Left ventricle: The cavity size was normal. Wall thickness was    increased in a pattern of mild LVH. Systolic function was normal.    The estimated ejection fraction was in the range of 55% to 60%.    Wall motion was normal; there were no regional wall motion    abnormalities. Features are consistent with a pseudonormal left    ventricular filling pattern, with concomitant abnormal relaxation    and increased filling pressure (grade 2 diastolic dysfunction).    Doppler parameters are consistent with high ventricular filling    pressure.  - Mitral valve: Severely calcified annulus. There was mild    regurgitation.  - Left atrium: The atrium was mildly dilated.   Impressions:  - Normal LV systolic function; moderate diastolic dysfunction with    elevated LV filling pressure; mild MR; mild LAE.      02/05/14: TTE Study Conclusions - Left ventricle: The cavity size was normal. There was mild focal   basal hypertrophy of the septum. Systolic function was normal.   The estimated ejection fraction was in the range of 60% to 65%.   Wall motion was normal; there were no regional wall motion   abnormalities. - Aortic valve: Valve  mobility was restricted. There was mild   regurgitation. Mean gradient (S): 7 mm Hg. Peak gradient (S): 12   mm Hg. Valve area (VTI): 2.03 cm^2. Valve area (Vmean): 2.02   cm^2. - Mitral valve: Calcified annulus. Mildly thickened leaflets . The   findings are consistent with mild stenosis. There was moderate   regurgitation. Valve area by continuity equation (using LVOT   flow): 2.29 cm^2. - Left atrium: The atrium was mildly dilated. - Right ventricle: Systolic function was mildly reduced. - Right atrium: The atrium was mildly dilated. Impressions: - Normal LV function; mild biatrial enlargement; thickened MV;   moderate MR; mild MS by mean gradient (6 mmHg); calcified aortic   valve but no significant AS by doppler; mild AI.   11/20/2015: EPS 1. LA flutter 2. Successful DCCV with 200 Joules of biphasic energy.   10/31/2014: EPS/ablation CONCLUSIONS:  1. Isthmus-dependent right atrial flutter upon presentation.  2. Successful radiofrequency ablation of atrial flutter along the cavotricuspid isthmus with complete bidirectional isthmus block achieved.  3. No inducible arrhythmias following  ablation.  4. No early apparent complications.    02/06/14: stress myoview Impression Exercise Capacity:  Poor exercise capacity. BP Response:  Normal blood pressure response. Clinical Symptoms:  There is dyspnea. ECG Impression:  A-fib with RVR at baseline and with mild exercise. Comparison with Prior Nuclear Study: No previous nuclear study performed Overall Impression:  Intermediate risk stress nuclear study with large-sized (Extent 18%), severe intensity fixed inferolateral defect consitent with LCx territory scar. LV Ejection Fraction: 57%.  LV Wall Motion:  inferolateral akinesis  Patient Profile     77 y.o. male with a hx of CAD(CABG 2011), AFlutter ablated/AFib, recurrent AFlutter,  DM, prostate Ca (hx of extensive XRT >> chronic back pain), RBBB, VHD w/severe AS   Admitted for TAVR >  06/02/22 >> overnight post procedure developed CHB with V standstill pauses as long as 14 seconds and intermittent brady rates to 30's   AFlutter Hx: Ablation of typical AFlutter 10/31/14 EPS 11/20/15:  LA flutter > DCCV EPS/PVI ablation 09/17/2016 AAD Hx: July 2017 started on amiodarone > stopped April 2018 Hx of QT prolongation on Tikosyn   Off a/c >> patient preference post ablation  Assessment & Plan    Post TAVR intermittent CHB Baseline RBBB, 1st degree AVBlock S/p PPM yesterday  Site is stable Device check this AM with stable measurements, no arrhythmias Is AS/VS 117 on presenting rhythm No mode switches or arrhythmias CXR is pending >> ordered portable stat   2. N/V overnight BP elevated HR 110's intermittently higher Pt denies any CP, SOB Remains nauseous, reports 4 episodes of vomiting starting last evening after a bite or 2 of pizza Dr. Quentin Ore came to bedside did a quick look echo noting no pericardial effusion S/p zofran without much relief Structural team updated, they Akanksha Bellmore see him   NOTE: He was in fact on Toprol '25mg'$  BID at home, last dose Monday 2/5 approx 2000 1/2 life approx 5 hours, with wash having been completed at/prior to his CHB episodes yesterday.  EP Jonte Shiller re-round on him later this AM with Dr. Curt Bears  For questions or updates, please contact [place] Please consult [email or web address] for contact info under        Signed, Baldwin Jamaica, PA-C  06/04/2022, 8:00 AM    I have seen and examined this patient with [name].  Agree with above, note added to reflect my findings.  On exam, RRR, no murmrus, lungs clear.  She is now status post Abbott dual chamber pacemaker for intermittent heart block.  Device functioning appropriately.  Chest x-ray and interrogation without issue.  Plan for discharge today with follow-up in device clinic.  Jaquelin Meaney M. Lanette Ell MD 06/04/2022 10:30 AM

## 2022-06-04 NOTE — Progress Notes (Signed)
Mobility Specialist Progress Note:   06/04/22 1009  Mobility  Activity Ambulated with assistance in hallway  Level of Assistance Contact guard assist, steadying assist  Assistive Device Front wheel walker  Distance Ambulated (ft) 450 ft  Activity Response Tolerated well  $Mobility charge 1 Mobility   During Mobility:123 HR Post Mobility:  104 HR  Pt in bed willing to participate in mobility. No complaints of pain. Pt required multiple cues to remain close to walker and to slow down. D/t sling I pushed R side of walker. Left in chair with all needs met.   Gareth Eagle Leyli Kevorkian Mobility Specialist Please contact via [facility] [employer] or  Rehab Office at [phone]

## 2022-06-04 NOTE — Progress Notes (Signed)
Patient had vomited multiple time throughout the night black greenish content. When asked Patient said "I ate lot of Chocolate 2 days ago" . We'll continue to monitor.

## 2022-06-04 NOTE — Progress Notes (Signed)
CARDIAC REHAB PHASE I   Pt feeling well this morning. Ambulated with mobility team reports no difficulties with walk. Post TAVR education including site care, restrictions, risk factors, heart healthy diabetic diet, exercise guidelines and CRP2 reviewed wit pt and wife. All questions and concerns addressed. Will refer to [facility] for CRP2. Plan for home today.   1030-1100     Vanessa Barbara, RN BSN 06/04/2022 10:55 AM

## 2022-06-04 NOTE — Discharge Instructions (Signed)
     Supplemental Discharge Instructions for  Pacemaker/Defibrillator Patients   Activity No heavy lifting or vigorous activity with your left/right arm for 6 to 8 weeks.  Do not raise your left/right arm above your head for one week.  Gradually raise your affected arm as drawn below.             06/07/22                     06/08/22                     06/10/22                   05/11/22 __  NO DRIVING until cleared to at your wound check visit.  WOUND CARE Keep the wound area clean and dry.  Do not get this area wet , no showers until cleared to at your wound check visit  . The tape/steri-strips on your wound will fall off; do not pull them off.  No bandage is needed on the site.  DO  NOT apply any creams, oils, or ointments to the wound area. If you notice any drainage or discharge from the wound, any swelling or bruising at the site, or you develop a fever > 101? F after you are discharged home, call the office at once.  Special Instructions You are still able to use cellular telephones; use the ear opposite the side where you have your pacemaker/defibrillator.  Avoid carrying your cellular phone near your device. When traveling through airports, show security personnel your identification card to avoid being screened in the metal detectors.  Ask the security personnel to use the hand wand. Avoid arc welding equipment, MRI testing (magnetic resonance imaging), TENS units (transcutaneous nerve stimulators).  Call the office for questions about other devices. Avoid electrical appliances that are in poor condition or are not properly grounded. Microwave ovens are safe to be near or to operate.

## 2022-06-05 ENCOUNTER — Telehealth: Payer: Self-pay

## 2022-06-05 NOTE — Patient Outreach (Addendum)
  Care Coordination TOC Note Transition Care Management Follow-up Telephone Call Date of discharge and from where: 06/04/22-[facility]  Dx: "s/p TAVR" How have you been since you were released from the hospital? Patient states he is "doing well except for some hiccups." His arm is in a sling. Denies any pain or discomfort. He rested well last night.  Any questions or concerns? No  Items Reviewed: Did the pt receive and understand the discharge instructions provided? Yes  Medications obtained and verified? Yes  Other? Yes  Any new allergies since your discharge? No  Dietary orders reviewed? Yes Do you have support at home? Yes   Home Care and Equipment/Supplies: Were home health services ordered? not applicable If so, what is the name of the agency? N/A  Has the agency set up a time to come to the patient's home? not applicable Were any new equipment or medical supplies ordered?  No What is the name of the medical supply agency? N/A Were you able to get the supplies/equipment? not applicable Do you have any questions related to the use of the equipment or supplies? No  Functional Questionnaire: (I = Independent and D = Dependent) ADLs: I  Bathing/Dressing- I  Meal Prep- I  Eating- I  Maintaining continence- I  Transferring/Ambulation- I  Managing Meds- I  Follow up appointments reviewed:  PCP Hospital f/u appt confirmed? No   Specialist Hospital f/u appt confirmed? Yes  Scheduled to see Karolee Stamps on 06/08/22 @ 8:15 am. Are transportation arrangements needed? No  If their condition worsens, is the pt aware to call PCP or go to the Emergency Dept.? Yes Was the patient provided with contact information for the PCP's office or ED? Yes Was to pt encouraged to call back with questions or concerns? Yes  SDOH assessments and interventions completed:   Yes SDOH Interventions Today    Flowsheet Row Most Recent Value  SDOH Interventions   Food Insecurity Interventions  Intervention Not Indicated  Transportation Interventions Intervention Not Indicated       Care Coordination Interventions:    Interventions Today    Flowsheet Row Most Recent Value  Education Interventions   Education Provided Provided Verbal Education  Provided Verbal Education On When to see the doctor  Nutrition Interventions   Nutrition Discussed/Reviewed Nutrition Discussed       TOC Interventions Today    Flowsheet Row Most Recent Value  TOC Interventions   TOC Interventions Discussed/Reviewed TOC Interventions Discussed, Post discharge activity limitations per provider, Post op wound/incision care         Encounter Outcome:  Pt. Visit Completed    Enzo Montgomery, RN,BSN,CCM [facility] Telephonic Care Management Coordinator Direct Phone: [phone] Toll Free: [phone] Fax: [phone]

## 2022-06-05 NOTE — Telephone Encounter (Signed)
Patient contacted regarding discharge from [facility] on 06/04/2022.  Patient understands to follow up with provider Kathyrn Drown NP on 06/08/2022 at 8:15 AM at [facility] office. Patient understands discharge instructions? yes Patient understands medications and regiment? yes Patient understands to bring all medications to this visit? Yes  Overall the pt is feeling okay.  He does complain of being tired today and he continues to have hiccups.  Per the patient he was told that his vagus nerve is irritated and it will take time for things to settle down and hiccups to resolve. I provided the pt with the phone number for on-call staff over the weekend if he has any additional questions or concerns prior to his appointment on Monday morning.

## 2022-06-05 NOTE — Progress Notes (Unsigned)
[place]                                     Cardiology Office Note:    Date:  06/05/2022   ID:  Dante L Blasko III, DOB 1945/11/08, MRN [id number]  PCP:  Wendie Agreste, MD  [place] Cardiologist:  Will Meredith Leeds, MD/ Dr. Angelena Form, MD and Dr. Cyndia Bent, MD (TAVR)   [facility] Electrophysiologist:  Will Meredith Leeds, MD   Referring MD: Wendie Agreste, MD   No chief complaint on file. ***  History of Present Illness:    Geordie L Cleland III is a 77 y.o. male with a hx of CAD s/p 3V CABG (2011), obesity, testicular cancer s/p surgery and radiation, HLD, atrial fib/flutter s/p ablation x3, mitral regurgitation/stenosis, IDDM, and severe aortic stenosis who presented to [facility] on 06/02/22 for planned TAVR.    Mr. Theesfeld has been followed by Dr. Curt Bears for his cardiology care. He was referred to the structural heart team for evaluation of severe aortic stenosis 02/2022. In 2011 he underwent 3V CABG for severe CAD and has done well since that time. He has a hx of atrial flutter and has undergone AF ablation x3. More recently he has been having symptoms of dizziness and fatigue with subsequent echocardiogram 11/10/21 showing an LVEF at 55-60% with mitral stenosis, moderately severe aortic stenosis with mean gradient of 26 mmHg, DI 0.19, SVI 30. AVA 0.8 cm2. TEE 03/13/22 confirmed mild mitral stenosis with severe aortic stenosis.    In consultation, he was having NYHA class II/III symptoms and felt to be a good candidate for TAVR. Repeat cardiac catheterization showed severe three vessel CAD s/p 3V CABG with 3/3 patent bypass grafts and severe low flow/low gradient aortic stenosis by echo. CT imaging showed anatomy suitable for S3UR 41m valve.    He was then evaluated by the multidisciplinary valve team and felt to have severe, symptomatic aortic stenosis and to be a suitable candidate for TAVR, which was set up for 06/02/22.     Severe AS: s/p successful TAVR with a 29 mm Edwards Sapien 3 THV via the TF approach on 06/02/22. Post operative echo with stable valve function with a mean gradient at 979mg with an AVA by VTI at 1.92cm2. Groin sites remain stable. EKG this AM shows NSR with V pacing. He was restarted on ASA 8137mD. He will need lifelong dental SBE with Amoxicillin [facility]. He has ambulated with CRI with no concerns. See N/V plan below.    Symptomatic high grade AV block: Noted to have an episode overnight of symptomatic AV block with dizziness and near syncope. Rates in the 20's. HR spontaneously returned to NSR. Pacing pads on. No recurrence. EP consulted and  PPM placed 06/03/22. Post implant CXR with no acute findings and stable PPM placement. Shoulder restrictions reviewed with patient and placed in AVS for discharge.    Nausea and vomiting: Patient with nausea and vomiting overnight. Labs appear normal with no evidence of infectious process. Abdomen soft without pain. Hb stable at 11.2 which appears to be at his baseline. Unclear etiology. [facility]    CAD s/p 3V CABG (2011): Denies anginal symptoms. Continue current regimen. Pre TAVR cath with patent grafts.    Atrial flutter: s/p multiple ablations  and followed by Dr. Curt Bears. No longer on anticoagulation    Tachycardia: Noted to be quite tachycardic in the 90-110 range today. Restarted beta block now that PPM in place. Will follow OP for further antihypertensive titrations.    IDDM: SSI while inpatient and resume home medications at discharge.    MR/stenosis: Mild on recent TEE. Continue to follow with surveillance imaging.    Incidental findings: Small solid pulmonary nodule of the left upper lobe measuring 3 mm. No follow-up needed if patient is low-risk. Non-contrast chest CT can be considered in 12 months if patient is high-risk. Will discuss at [facility] follow up with myself.    Elevated  RAISE score: Cardiac Amyloid labs drawn while inpatient. Depending on results, may order outpatient PYP imaging.  Kappa light chain elevated at 38.2, kappa light chain ration elevated at 1.93, myeloma panel and immunofixation in-process.    Past Medical History:  Diagnosis Date   Anemia    Arthritis    "back, left ankle" (11/20/2015)   Cataract    CHF (congestive heart failure) (HCC)    Chronic lower back pain    Coronary artery disease    a. 05/2009 CABG x 3: LIMA->LAD, VG->OM, VG->PDA; b. Nuc 01/2014: inf-lat scar but no ischemia, EF 57%.   Diabetic retinopathy (HCC)    mild- Dr. Herbert Deaner   Glaucoma    Hyperlipidemia    a. Intolerant of lipitor and vytorin.   Lyme disease    Migraine    "once or twice" (11/20/2015)   Myocardial infarction [facility])    'saw evidence of it on an EKG done in 2005"   Paroxysmal atrial flutter ([place])    a. 01/2014 s/p DCCV;  b. CHA2DS2VASc = 3-->Eliquis.   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 MAngelena Formand Dr. BCyndia Bent  Sacral pain    "right"   Sciatica of right side    [place] cancer [place]   "heavy doses of radiation; it was stage 2"   Type II diabetes mellitus ([place]    Valvular heart disease    a. 01/2014 Echo: Ef 60-65%, no rwma, mild AI/MS, mod MR, mildly dil LA.    Past Surgical History:  Procedure Laterality Date   ABDOMINAL EXPLORATION SURGERY  ~ 2005   "for hernia, but didn't have one"   ATRIAL FIBRILLATION ABLATION N/A 09/17/2016   Procedure: Atrial Fibrillation Ablation;  Surgeon: CConstance Haw MD;  [place] [facility];  Service: Cardiovascular;  Laterality: N/A;   CARDIAC CATHETERIZATION  2011   CARDIOVERSION N/A 02/19/2014   Procedure: CARDIOVERSION;  Surgeon: HSinclair Grooms MD;  [place]  Service: Cardiovascular;  Laterality: N/A;   CARDIOVERSION N/A 10/28/2015   Procedure: CARDIOVERSION;  Surgeon: PThayer Headings MD;   [place]  Service: Cardiovascular;  Laterality: N/A;   CARDIOVERSION  11/20/2015   "200 joules"   CORONARY ARTERY BYPASS GRAFT  05/2009   LIMA-LAD, SVG-OM, SVG-PDA 06/20/09   ELBOW FRACTURE SURGERY Left    broken ulna on left elbow-surgical repair   ELECTROPHYSIOLOGIC STUDY N/A 10/31/2014   Procedure: A-Flutter;  Surgeon: GEvans Lance MD;  [place];  Service: Cardiovascular;  Laterality: N/A;   ELECTROPHYSIOLOGIC STUDY N/A 11/20/2015   Procedure: A-Flutter Ablation;  Surgeon: GEvans Lance MD;  [place];  Service: Cardiovascular;  Laterality: N/A;   EYE SURGERY  FRACTURE SURGERY     INTRAOPERATIVE TRANSTHORACIC ECHOCARDIOGRAM N/A 06/02/2022   Procedure: INTRAOPERATIVE TRANSTHORACIC ECHOCARDIOGRAM;  Surgeon: Burnell Blanks, MD;  Location: [place];  Service: Open Heart Surgery;  Laterality: N/A;   PACEMAKER IMPLANT N/A 06/03/2022   Procedure: PACEMAKER IMPLANT;  Surgeon: [name], MD;  Location: [place] [facility];  Service: Cardiovascular;  Laterality: N/A;   RIGHT/LEFT HEART CATH AND CORONARY/GRAFT ANGIOGRAPHY N/A 04/13/2022   Procedure: RIGHT/LEFT HEART CATH AND CORONARY/GRAFT ANGIOGRAPHY;  Surgeon: Burnell Blanks, MD;  Location: [place] [facility] LAB;  Service: Cardiovascular;  Laterality: N/A;   TEE WITHOUT CARDIOVERSION N/A 03/13/2022   Procedure: TRANSESOPHAGEAL ECHOCARDIOGRAM (TEE);  Surgeon: Sanda Klein, MD;  Location: [place];  Service: Cardiovascular;  Laterality: N/A;   TESTICLE REMOVAL Right 1982   TONSILLECTOMY  ~ [place], TRANSFEMORAL N/A 06/02/2022   Procedure: Transcatheter Aortic Valve Replacement, Transfemoral;  Surgeon: Burnell Blanks, MD;  Location: [place];  Service: Open Heart Surgery;  Laterality: N/A;    Current Medications: No outpatient medications have been marked as taking for the 06/08/22 encounter (Appointment) with [facility].     Allergies:   Actos [pioglitazone], Bee venom, Codeine, Metformin, Statins, Glucophage [metformin hcl], and Raspberry   Social History   Socioeconomic History   Marital status: Married    Spouse name: Not on file   Number of children: 0   Years of education: Not on file   Highest education level: Not on file  Occupational History   Occupation: Photo journalist  Tobacco Use   Smoking status: Former    Packs/day: 0.50    Years: 15.00    Total pack years: 7.50    Types: Cigarettes    Quit date: 1990    Years since quitting: 34.1   Smokeless tobacco: Never   Tobacco comments:    "quit smoking cigarettes in the 1980's; don't know how much or for how long"  Vaping Use   Vaping Use: Never used  Substance and Sexual Activity   Alcohol use: Yes    Alcohol/week: 3.0 standard drinks of alcohol    Types: 3 Cans of beer per week   Drug use: No    Comment: "quit smoking pot in the 1990s"   Sexual activity: Not Currently    Birth control/protection: Coitus interruptus  Other Topics Concern   Not on file  Social History Narrative   Not on file   Social Determinants of Health   Financial Resource Strain: Not on file  Food Insecurity: No Food Insecurity (06/05/2022)   Hunger Vital Sign    Worried About Running Out of Food in the Last Year: Never true    Ran Out of Food in the Last Year: Never true  Transportation Needs: No Transportation Needs (06/05/2022)   PRAPARE - Hydrologist (Medical): No    Lack of Transportation (Non-Medical): No  Physical Activity: Not on file  Stress: Not on file  Social Connections: Not on file    Family History: The patient's family history includes Diabetes in his father, mother, and sister; Heart attack in his father; Heart disease in his father and mother; Hypertension in his father.  ROS:   Please see the history of present illness.    All other systems reviewed and are  negative.  EKGs/Labs/Other Studies Reviewed:    The following studies were reviewed today:   PPM 06/03/22:   CONCLUSIONS:   1.  Successful implantation of a Abbott Assurity [id number] dual-chamber pacemaker for symptomatic bradycardia  2. No early apparent complications.       [facility]  TAVR OPERATIVE NOTE     Date of Procedure:                06/02/2022   Preoperative Diagnosis:      Severe Aortic Stenosis    Postoperative Diagnosis:    Same    Procedure:        Transcatheter Aortic Valve Replacement - Transfemoral Approach             Edwards Sapien 3 THV (size 29 mm, model # [id number], serial # [id number])              Co-Surgeons:                        Lauree Chandler, MD and Gilford Raid , MD    Anesthesiologist:                  Kalman Shan   Echocardiographer:              Croitoru   Pre-operative Echo Findings: Severe aortic stenosis Normal left ventricular systolic function   Post-operative Echo Findings: No paravalvular leak Normal left ventricular systolic function   _____________   Echo 06/03/22:    1. Left ventricular ejection fraction, by estimation, is 50 to 55%. The  left ventricle has low normal function. The left ventricle has no regional  wall motion abnormalities. There is mild concentric left ventricular  hypertrophy. Left ventricular  diastolic function could not be evaluated.   2. Right ventricular systolic function is normal. The right ventricular  size is normal. Tricuspid regurgitation signal is inadequate for assessing  PA pressure.   3. Left atrial size was severely dilated.   4. Right atrial size was mildly dilated.   5. The mitral valve is degenerative. Trivial mitral valve regurgitation.  Mild mitral stenosis. The mean mitral valve gradient is 7.0 mmHg with  average heart rate of 99 bpm. Severe mitral annular calcification.   6. The aortic valve has been repaired/replaced. Aortic valve  regurgitation is not visualized.  There is a 29 mm Sapien prosthetic (TAVR)  valve present in the aortic position. Procedure Date: 06/02/2022. Aortic  valve mean gradient measures 9.0 mmHg. Aortic  valve Vmax measures 1.92 m/s. Aortic valve acceleration time measures 74  msec.   7. The inferior vena cava is normal in size with greater than 50%  respiratory variability, suggesting right atrial pressure of 3 mmHg.    EKG:  EKG is *** ordered today.  The ekg ordered today demonstrates ***  Recent Labs: 10/23/2021: Pro B Natriuretic peptide (BNP) 223.0; TSH 2.24 05/29/2022: ALT 15 06/03/2022: Magnesium 1.9 06/04/2022: BUN 17; Creatinine, Ser 1.16; Hemoglobin 11.2; Platelets 179; Potassium 4.2; Sodium 134   Recent Lipid Panel    Component Value Date/Time   CHOL 228 (H) 07/07/2021 1018   CHOL 230 (H) 07/08/2020 1318   CHOL 216 (H) 06/21/2013 1405   TRIG 177.0 (H) 07/07/2021 1018   TRIG 132 06/21/2013 1405   HDL 45.40 07/07/2021 1018   HDL 50 07/08/2020 1318   HDL 54 06/21/2013 1405   CHOLHDL 5 07/07/2021 1018   VLDL 35.4 07/07/2021 1018   LDLCALC 147 (H) 07/07/2021 1018   LDLCALC 164 (H) 07/08/2020 1318   LDLCALC 136 (H) 06/21/2013 1405     Risk Assessment/Calculations:   {  Does this patient have ATRIAL FIBRILLATION?:[phone]}   Physical Exam:    VS:  There were no vitals taken for this visit.    Wt Readings from Last 3 Encounters:  06/02/22 200 lb (90.7 kg)  05/20/22 196 lb (88.9 kg)  04/13/22 196 lb (88.9 kg)     GEN: *** Well nourished, well developed in no acute distress HEENT: Normal NECK: No JVD; No carotid bruits LYMPHATICS: No lymphadenopathy CARDIAC: ***RRR, no murmurs, rubs, gallops RESPIRATORY:  Clear to auscultation without rales, wheezing or rhonchi  ABDOMEN: Soft, non-tender, non-distended MUSCULOSKELETAL:  No edema; No deformity  SKIN: Warm and dry NEUROLOGIC:  Alert and oriented x 3 PSYCHIATRIC:  Normal affect   ASSESSMENT:    No diagnosis found. PLAN:    In order of problems  listed above:    { Click here to see a list of contraindications to cardiac rehab    :1}  Cardiac Rehabilitation Eligibility Assessment: {The patient has an active order for cardiac rehab.  Please indicate if the patient is ready to start cardiac rehab.:[id number]::"The patient is ready to start Cardiac Rehab from a cardiac standpoint."}   {Are you ordering a CV Procedure (e.g. stress test, cath, DCCV, TEE, etc)?   Press F2        :[id number]    Medication Adjustments/Labs and Tests Ordered: Current medicines are reviewed at length with the patient today.  Concerns regarding medicines are outlined above.  No orders of the defined types were placed in this encounter.  No orders of the defined types were placed in this encounter.   There are no Patient Instructions on file for this visit.   Signed, Kathyrn Drown, NP  06/05/2022 2:07 PM    [place]

## 2022-06-05 NOTE — H&P (View-Only) (Signed)
[facility] [place]                                     Cardiology Office Note:    Date:  06/08/2022   ID:  Matthew [name], DOB 1945/07/28, MRN [id number]  PCP:  Wendie Agreste, MD  [place] Cardiologist:  Will Meredith Leeds, MD/ Dr. Angelena Form, MD and Dr. Cyndia Bent, MD (TAVR)   [facility] Electrophysiologist:  Will Meredith Leeds, MD   Referring MD: Wendie Agreste, MD   Chief Complaint  Patient presents with   Follow-up    [facility] s/p TAVR   History of Present Illness:    Matthew [name] is a 77 y.o. male with a hx of CAD s/p 3V CABG (2011), obesity, testicular cancer s/p surgery and radiation, HLD, atrial fib/flutter s/p ablation x3, mitral regurgitation/stenosis, IDDM, and severe aortic stenosis who presented to [facility] on 06/02/22 for planned TAVR and is being seen today for TOC follow up.    Mr. [name] has been followed by Dr. Curt Bears for his cardiology care. He was referred to the structural heart team for evaluation of severe aortic stenosis 02/2022. In 2011 he underwent 3V CABG for severe CAD and has done well since that time. He has a hx of atrial flutter and has undergone AF ablation x3. More recently he has been having symptoms of dizziness and fatigue with subsequent echocardiogram 11/10/21 showing an LVEF at 55-60% with mitral stenosis, moderately severe aortic stenosis with mean gradient of 26 mmHg, DI 0.19, SVI 30. AVA 0.8 cm2. TEE 03/13/22 confirmed mild mitral stenosis with severe aortic stenosis.    In consultation, he was having NYHA class II/[name] symptoms and felt to be a good candidate for TAVR. Repeat cardiac catheterization showed severe three vessel CAD s/p 3V CABG with 3/3 patent bypass grafts and severe low flow/low gradient aortic stenosis by echo. CT imaging showed anatomy suitable for S3UR 55m valve.    He was then evaluated by the multidisciplinary valve team and felt to have severe, symptomatic aortic  stenosis and to be a suitable candidate.   He is now s/p successful TAVR with a 29 mm Edwards Sapien 3 THV via the TF approach on 06/02/22. Post operative echo with stable valve function with a mean gradient at 972mg with an AVA by VTI at 1.92cm2. Noted to have an episode of symptomatic AV block with dizziness and near syncope post TAVR. EP consulted and PPM placed 06/03/22. Post implant CXR with no acute findings and stable PPM placement. He was started on ASA monotherapy with no issues.   Today he presents with his wife and reports that he has felt poorly since hospital discharge. EKG today shows atrial fibrillation with RVR with a rate at 131bpm. He has LE edema but no real SOB. He denies chest pain, palpitations, orthopnea, blood in stool or urine, dizziness, or syncope. Device interrogated today which shows AF started 2/10. Groin sites stable with no bleeding or hematoma.   Past Medical History:  Diagnosis Date   Anemia    Arthritis    "back, left ankle" (11/20/2015)   Cataract    CHF (congestive heart failure) (HCC)    Chronic lower back pain    Coronary artery disease    a. 05/2009 CABG x 3: LIMA->LAD, VG->OM, VG->PDA; b. Nuc 01/2014: inf-lat scar but no ischemia, EF  57%.   Diabetic retinopathy (HCC)    mild- Dr. Herbert Deaner   Glaucoma    Hyperlipidemia    a. Intolerant of lipitor and vytorin.   Lyme disease    Migraine    "once or twice" (11/20/2015)   Myocardial infarction [facility])    'saw evidence of it on an EKG done in 2005"   Paroxysmal atrial flutter ([place])    a. 01/2014 s/p DCCV;  b. CHA2DS2VASc = 3-->Eliquis.   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 MAngelena Formand Dr. BCyndia Bent  Sacral pain    "right"   Sciatica of right side    Testicular cancer [place]   "heavy doses of radiation; it was stage 2"   Type II diabetes mellitus [place]    Valvular heart disease    a. 01/2014 Echo: Ef 60-65%, no rwma, mild  AI/MS, mod MR, mildly dil LA.    Past Surgical History:  Procedure Laterality Date   ABDOMINAL EXPLORATION SURGERY  ~ 2005   "for hernia, but didn't have one"   ATRIAL FIBRILLATION ABLATION N/A 09/17/2016   Procedure: Atrial Fibrillation Ablation;  Surgeon: CConstance Haw MD;  [place];  Service: Cardiovascular;  Laterality: N/A;   CARDIAC CATHETERIZATION  2011   CARDIOVERSION N/A 02/19/2014   Procedure: CARDIOVERSION;  Surgeon: HSinclair Grooms MD;  [place]  Service: Cardiovascular;  Laterality: N/A;   CARDIOVERSION N/A 10/28/2015   Procedure: CARDIOVERSION;  Surgeon: PThayer Headings MD;  [place] [facility]  Service: Cardiovascular;  Laterality: N/A;   CARDIOVERSION  11/20/2015   "200 joules"   CORONARY ARTERY BYPASS GRAFT  05/2009   LIMA-LAD, SVG-OM, SVG-PDA 06/20/09   ELBOW FRACTURE SURGERY Left    broken ulna on left elbow-surgical repair   ELECTROPHYSIOLOGIC STUDY N/A 10/31/2014   Procedure: A-Flutter;  Surgeon: GEvans Lance MD;  [place];  Service: Cardiovascular;  Laterality: N/A;   ELECTROPHYSIOLOGIC STUDY N/A 11/20/2015   Procedure: A-Flutter Ablation;  Surgeon: GEvans Lance MD;  [place];  Service: Cardiovascular;  Laterality: N/A;   EYE SURGERY     FRACTURE SURGERY     INTRAOPERATIVE TRANSTHORACIC ECHOCARDIOGRAM N/A 06/02/2022   Procedure: INTRAOPERATIVE TRANSTHORACIC ECHOCARDIOGRAM;  Surgeon: MBurnell Blanks MD;  [place] [facility]  Service: Open Heart Surgery;  Laterality: N/A;   PACEMAKER IMPLANT N/A 06/03/2022   Procedure: PACEMAKER IMPLANT;  Surgeon: CConstance Haw MD;  [place] [facility];  Service: Cardiovascular;  Laterality: N/A;   RIGHT/LEFT HEART CATH AND CORONARY/GRAFT ANGIOGRAPHY N/A 04/13/2022   Procedure: RIGHT/LEFT HEART CATH AND CORONARY/GRAFT ANGIOGRAPHY;  Surgeon: MBurnell Blanks MD;  [facility];  Service: Cardiovascular;   Laterality: N/A;   TEE WITHOUT CARDIOVERSION N/A 03/13/2022   Procedure: TRANSESOPHAGEAL ECHOCARDIOGRAM (TEE);  Surgeon: CSanda Klein MD;  Location: [facility]  Service: Cardiovascular;  Laterality: N/A;   TESTICLE REMOVAL Right 1982   TONSILLECTOMY  ~ [place] TRANSFEMORAL N/A 06/02/2022   Procedure: Transcatheter Aortic Valve Replacement, Transfemoral;  Surgeon: MBurnell Blanks MD;  [place]  Service: Open Heart Surgery;  Laterality: N/A;   Current Medications: Current Meds  Medication Sig   apixaban (ELIQUIS) 5 MG TABS tablet Take 1 tablet (5 mg total) by mouth 2 (two) times daily.   Aspirin-Caffeine (BAYER BACK & BODY) 500-32.5 MG TABS  Take 1 tablet by mouth daily as needed (pain).   Continuous Blood Gluc Receiver (DEXCOM G6 RECEIVER) DEVI Use to receive data from sensor   Continuous Blood Gluc Sensor (DEXCOM G6 SENSOR) MISC Use to monitor blood sugar, change after 10 days   dorzolamide-timolol (COSOPT) 22.3-6.8 MG/ML ophthalmic solution Place 1 drop into the left eye 2 (two) times daily.   furosemide (LASIX) 40 MG tablet Take 1 tablet (40 mg total) by mouth daily as needed. (Patient taking differently: Take 40 mg by mouth daily as needed for fluid.)   insulin aspart (NOVOLOG) 100 UNIT/ML injection Inject 18-20 Units into the skin 3 (three) times daily with meals. (Patient taking differently: Inject 15-20 Units into the skin 3 (three) times daily with meals. Sliding scale)   Insulin Syringe-Needle U-100 (BD VEO INSULIN SYRINGE U/F) 31G X 15/64" 0.5 ML MISC Use to inject 4 times a day   latanoprost (XALATAN) 0.005 % ophthalmic solution Place 1 drop into the left eye at bedtime.   lisinopril (ZESTRIL) 5 MG tablet TAKE 1 TABLET BY MOUTH EVERY DAY   metoprolol succinate (TOPROL-XL) 100 MG 24 hr tablet Take 1 tablet (100 mg total) by mouth daily. Take with or immediately following a meal.   NOVOLIN N 100 UNIT/ML injection INJECT 22  UNITS IN THE SKIN DAILY AT BEDTIME. (Patient taking differently: 20 Units at bedtime.)   potassium chloride (KLOR-CON 10) 10 MEQ tablet Take 1 tablet (10 mEq total) by mouth as needed. With Lasix (Patient taking differently: Take 10 mEq by mouth daily as needed (when taking lasix).)   [DISCONTINUED] aspirin EC 81 MG tablet Take 81 mg by mouth in the morning. Swallow whole.   [DISCONTINUED] Metoprolol Succinate 50 MG CS24 Take 1/2 tablet by mouth in the morning and 1/2 tablet at bedtime (Total 50 mg )     Allergies:   Actos [pioglitazone], Bee venom, Codeine, Metformin, Statins, Glucophage [metformin hcl], and Raspberry   Social History   Socioeconomic History   Marital status: Married    Spouse name: Not on file   Number of children: 0   Years of education: Not on file   Highest education level: Not on file  Occupational History   Occupation: Ambulance person  Tobacco Use   Smoking status: Former    Packs/day: 0.50    Years: 15.00    Total pack years: 7.50    Types: Cigarettes    Quit date: 1990    Years since quitting: 34.1   Smokeless tobacco: Never   Tobacco comments:    "quit smoking cigarettes in the 1980's; don't know how much or for how long"  Vaping Use   Vaping Use: Never used  Substance and Sexual Activity   Alcohol use: Yes    Alcohol/week: 3.0 standard drinks of alcohol    Types: 3 Cans of beer per week   Drug use: No    Comment: "quit smoking pot in the 1990s"   Sexual activity: Not Currently    Birth control/protection: Coitus interruptus  Other Topics Concern   Not on file  Social History Narrative   Not on file   Social Determinants of Health   Financial Resource Strain: Not on file  Food Insecurity: No Food Insecurity (06/05/2022)   Hunger Vital Sign    Worried About Running Out of Food in the Last Year: Never true    Ran Out of Food in the Last Year: Never true  Transportation Needs: No Transportation Needs (06/05/2022)   PRAPARE -  Armed forces logistics/support/administrative officer (Medical): No    Lack of Transportation (Non-Medical): No  Physical Activity: Not on file  Stress: Not on file  Social Connections: Not on file    Family History: The patient's family history includes Diabetes in his father, mother, and sister; Heart attack in his father; Heart disease in his father and mother; Hypertension in his father.  ROS:   Please see the history of present illness.    All other systems reviewed and are negative.  EKGs/Labs/Other Studies Reviewed:    The following studies were reviewed today:   PPM 06/03/22:   CONCLUSIONS:   1. Successful implantation of a Abbott Assurity [id number] dual-chamber pacemaker for symptomatic bradycardia  2. No early apparent complications.       [facility]  TAVR OPERATIVE NOTE     Date of Procedure:                06/02/2022   Preoperative Diagnosis:      Severe Aortic Stenosis    Postoperative Diagnosis:    Same    Procedure:        Transcatheter Aortic Valve Replacement - Transfemoral Approach             Edwards Sapien 3 THV (size 29 mm, model # [id number], serial # [id number])              Co-Surgeons:                        Lauree Chandler, MD and Gilford Raid , MD    Anesthesiologist:                  Kalman Shan   Echocardiographer:              Croitoru   Pre-operative Echo Findings: Severe aortic stenosis Normal left ventricular systolic function   Post-operative Echo Findings: No paravalvular leak Normal left ventricular systolic function   _____________   Echo 06/03/22:    1. Left ventricular ejection fraction, by estimation, is 50 to 55%. The  left ventricle has low normal function. The left ventricle has no regional  wall motion abnormalities. There is mild concentric left ventricular  hypertrophy. Left ventricular  diastolic function could not be evaluated.   2. Right ventricular systolic function is normal. The right ventricular  size is normal. Tricuspid  regurgitation signal is inadequate for assessing  PA pressure.   3. Left atrial size was severely dilated.   4. Right atrial size was mildly dilated.   5. The mitral valve is degenerative. Trivial mitral valve regurgitation.  Mild mitral stenosis. The mean mitral valve gradient is 7.0 mmHg with  average heart rate of 99 bpm. Severe mitral annular calcification.   6. The aortic valve has been repaired/replaced. Aortic valve  regurgitation is not visualized. There is a 29 mm Sapien prosthetic (TAVR)  valve present in the aortic position. Procedure Date: 06/02/2022. Aortic  valve mean gradient measures 9.0 mmHg. Aortic  valve Vmax measures 1.92 m/s. Aortic valve acceleration time measures 74  msec.   7. The inferior vena cava is normal in size with greater than 50%  respiratory variability, suggesting right atrial pressure of 3 mmHg.   EKG:  EKG is ordered today.  The ekg ordered today demonstrates atrial fibrillation with RVR, HR 131bpm.   Recent Labs: 10/23/2021: Pro B Natriuretic peptide (BNP) 223.0; TSH 2.24 05/29/2022: ALT 15 06/03/2022: Magnesium 1.9 06/04/2022:  BUN 17; Creatinine, Ser 1.16; Hemoglobin 11.2; Platelets 179; Potassium 4.2; Sodium 134   Recent Lipid Panel    Component Value Date/Time   CHOL 228 (H) 07/07/2021 1018   CHOL 230 (H) 07/08/2020 1318   CHOL 216 (H) 06/21/2013 1405   TRIG 177.0 (H) 07/07/2021 1018   TRIG 132 06/21/2013 1405   HDL 45.40 07/07/2021 1018   HDL 50 07/08/2020 1318   HDL 54 06/21/2013 1405   CHOLHDL 5 07/07/2021 1018   VLDL 35.4 07/07/2021 1018   LDLCALC 147 (H) 07/07/2021 1018   LDLCALC 164 (H) 07/08/2020 1318   LDLCALC 136 (H) 06/21/2013 1405   Physical Exam:    VS:  BP (!) 140/70   Pulse (!) 131   Ht '5\' 6"'$  (1.676 m)   Wt 198 lb 12.8 oz (90.2 kg)   SpO2 96%   BMI 32.09 kg/m     Wt Readings from Last 3 Encounters:  06/08/22 198 lb 12.8 oz (90.2 kg)  06/02/22 200 lb (90.7 kg)  05/20/22 196 lb (88.9 kg)    General: Well developed,  well nourished, NAD Lungs:Clear to ausculation bilaterally. Breathing is unlabored. Cardiovascular: Irregularly irregular. No murmurs Extremities: 2-3+ BLE edema. Groin sites stable.  Neuro: Alert and oriented. No focal deficits. No facial asymmetry. MAE spontaneously. Psych: Responds to questions appropriately with normal affect.    ASSESSMENT/PLAN:    Severe AS: s/p successful TAVR with a 29 mm Edwards Sapien 3 THV via the TF approach on 06/02/22. Post operative echo with stable valve function with a mean gradient at 64mHg with an AVA by VTI at 1.92cm2. Groin sites remain stable. EKG today shows AF with RVR. See plan below. Stop ASA '81mg'$  and start Eliquis '5mg'$  BID. Plan telephone follow up next week and if he is feeling better, will plan DCCV in 3-4 weeks after adequate AC. If no improvement in rates, will complete TEE/DCCV. Will need lifelong dental SBE with Amoxicillin. Obtain CBC, BMET today.    Symptomatic high grade AV block: Noted to have symptomatic AV block with dizziness and near syncope post TAVR. EP consulted and PPM placed 06/03/22. Post implant CXR with no acute findings and stable PPM placement. Shoulder restrictions reviewed with patient and placed in AVS for discharge. Interrogated today by EP device team which shows AF started 2/10. Keep PPM wound clinic follow up.   Hx of atrial flutter/atrial fibrillation: s/p multiple ablations and followed by Dr. CCurt Bears No longer on anticoagulation however EKG today with AF with RVR. Case discussed with Dr. CCurt Bearswith plan to [facility] Center[facility] Eliquis and rate control for 3-4 weeks. Increase Toprol to '100mg'$  QD. Scheduled for tele health visit with me next week. If no improvement, will plan TEE/DCCV. If rates are improved with improved symptoms, will plan DCCV in 3-4 weeks.   Acute CHF: Volume overloaded in the setting of AF with RVR. Lasix '40mg'$  x3 days, then resume PRN use. Will follow next week. CBC, BMET today     Nausea and vomiting: Stable with  no recurrence since discharge.   CAD s/p 3V CABG (2011): Denies anginal symptoms. Continue current regimen. Pre TAVR cath with patent grafts.     IDDM: Follows with OP endocrinology.   HLD: Needs better control. [place]    MR/stenosis: Mild on recent TEE. Continue to follow with surveillance imaging.    Incidental findings: Small solid pulmonary nodule of the left upper lobe measuring 3 mm. No follow-up needed if patient  is low-risk. Non-contrast chest CT can be considered in 12 months if patient is high-risk. Will discuss at [facility] follow up with myself.    Elevated RAISE score: Cardiac Amyloid labs drawn while inpatient. Kappa light chain elevated at 38.2, kappa light chain ratio elevated at 1.93, immunofixation found to be unremarkable with no evidence of monoclonal protein. Myeloma panel remains pending. Will follow results. May require PYP scanning.   Medication Adjustments/Labs and Tests Ordered: Current medicines are reviewed at length with the patient today.  Concerns regarding medicines are outlined above.  Orders Placed This Encounter  Procedures   Basic Metabolic Panel (BMET)   CBC   EKG 12-Lead   Meds ordered this encounter  Medications   apixaban (ELIQUIS) 5 MG TABS tablet    Sig: Take 1 tablet (5 mg total) by mouth 2 (two) times daily.    Dispense:  60 tablet    Refill:  11   metoprolol succinate (TOPROL-XL) 100 MG 24 hr tablet    Sig: Take 1 tablet (100 mg total) by mouth daily. Take with or immediately following a meal.    Dispense:  90 tablet    Refill:  3    Patient Instructions  Medication Instructions:  Your physician has recommended you make the following change in your medication:  STOP ASPIRIN START ELIQUIS 5 MG TWICE DAILY . YOU HAVE BEEN GIVEN SAMPLES TODAY. INCREASE TOPROL 100 MG DAILY.     *If you need a refill on your cardiac medications before your next appointment, please call your pharmacy*  Lab Work: BMET,  CBC If you have labs (blood work) drawn today and your tests are completely normal, you will receive your results only by: [place] (if you have MyChart) OR A paper copy in the mail If you have any lab test that is abnormal or we need to change your treatment, we will call you to review the results.  Testing/Procedures: None ordered today.  Follow-Up: At [facility], you and your health needs are our priority.  As part of our continuing mission to provide you with exceptional heart care, we have created designated Provider Care Teams.  These Care Teams include your primary Cardiologist (physician) and Advanced Practice Providers (APPs -  Physician Assistants and Nurse Practitioners) who all work together to provide you with the care you need, when you need it.  We recommend signing up for the patient portal called "MyChart".  Sign up information is provided on this After Visit Summary.  MyChart is used to connect with patients for Virtual Visits (Telemedicine).  Patients are able to view lab/test results, encounter notes, upcoming appointments, etc.  Non-urgent messages can be sent to your provider as well.   To learn more about what you can do with MyChart, go to [email or web address].    Your next appointment:   Keep scheduled follow-up     Signed, Kathyrn Drown, NP  06/08/2022 11:49 AM    [place]

## 2022-06-08 ENCOUNTER — Ambulatory Visit: Payer: Medicare HMO | Attending: Internal Medicine | Admitting: Cardiology

## 2022-06-08 VITALS — BP 140/70 | HR 131 | Ht 66.0 in | Wt 198.8 lb

## 2022-06-08 DIAGNOSIS — I484 Atypical atrial flutter: Secondary | ICD-10-CM | POA: Diagnosis not present

## 2022-06-08 DIAGNOSIS — I10 Essential (primary) hypertension: Secondary | ICD-10-CM | POA: Diagnosis not present

## 2022-06-08 DIAGNOSIS — I350 Nonrheumatic aortic (valve) stenosis: Secondary | ICD-10-CM | POA: Diagnosis not present

## 2022-06-08 DIAGNOSIS — E7800 Pure hypercholesterolemia, unspecified: Secondary | ICD-10-CM | POA: Diagnosis not present

## 2022-06-08 DIAGNOSIS — Z01812 Encounter for preprocedural laboratory examination: Secondary | ICD-10-CM

## 2022-06-08 DIAGNOSIS — E119 Type 2 diabetes mellitus without complications: Secondary | ICD-10-CM | POA: Diagnosis not present

## 2022-06-08 DIAGNOSIS — I2510 Atherosclerotic heart disease of native coronary artery without angina pectoris: Secondary | ICD-10-CM

## 2022-06-08 DIAGNOSIS — I480 Paroxysmal atrial fibrillation: Secondary | ICD-10-CM

## 2022-06-08 DIAGNOSIS — Z952 Presence of prosthetic heart valve: Secondary | ICD-10-CM | POA: Diagnosis not present

## 2022-06-08 LAB — PROTEIN ELECTRO, RANDOM URINE
Albumin ELP, Urine: 25.4 %
Alpha-1-Globulin, U: 1.9 %
Alpha-2-Globulin, U: 38.9 %
Beta Globulin, U: 28.6 %
Gamma Globulin, U: 5.2 %
Total Protein, Urine: 43.3 mg/dL

## 2022-06-08 LAB — MULTIPLE MYELOMA PANEL, SERUM
Albumin SerPl Elph-Mcnc: 3.1 g/dL (ref 2.9–4.4)
Albumin/Glob SerPl: 1.3 (ref 0.7–1.7)
Alpha 1: 0.2 g/dL (ref 0.0–0.4)
Alpha2 Glob SerPl Elph-Mcnc: 0.7 g/dL (ref 0.4–1.0)
B-Globulin SerPl Elph-Mcnc: 0.9 g/dL (ref 0.7–1.3)
Gamma Glob SerPl Elph-Mcnc: 0.8 g/dL (ref 0.4–1.8)
Globulin, Total: 2.5 g/dL (ref 2.2–3.9)
IgA: 233 mg/dL (ref 61–437)
IgG (Immunoglobin G), Serum: 832 mg/dL (ref 603–1613)
IgM (Immunoglobulin M), Srm: 83 mg/dL (ref 15–143)
Total Protein ELP: 5.6 g/dL — ABNORMAL LOW (ref 6.0–8.5)

## 2022-06-08 MED ORDER — APIXABAN 5 MG PO TABS: 5.0000 mg | ORAL_TABLET | Freq: Two times a day (BID) | ORAL | 11 refills | Status: DC

## 2022-06-08 MED ORDER — METOPROLOL SUCCINATE ER 100 MG PO TB24: 100.0000 mg | ORAL_TABLET | Freq: Every day | ORAL | 3 refills | Status: DC

## 2022-06-08 NOTE — Patient Instructions (Addendum)
Medication Instructions:  Your physician has recommended you make the following change in your medication:  STOP ASPIRIN START ELIQUIS 5 MG TWICE DAILY . YOU HAVE BEEN GIVEN SAMPLES TODAY. INCREASE TOPROL 100 MG DAILY.     *If you need a refill on your cardiac medications before your next appointment, please call your pharmacy*  Lab Work: BMET, CBC If you have labs (blood work) drawn today and your tests are completely normal, you will receive your results only by: [place] (if you have MyChart) OR A paper copy in the mail If you have any lab test that is abnormal or we need to change your treatment, we will call you to review the results.  Testing/Procedures: None ordered today.  Follow-Up: At [facility], you and your health needs are our priority.  As part of our continuing mission to provide you with exceptional heart care, we have created designated Provider Care Teams.  These Care Teams include your primary Cardiologist (physician) and Advanced Practice Providers (APPs -  Physician Assistants and Nurse Practitioners) who all work together to provide you with the care you need, when you need it.  We recommend signing up for the patient portal called "MyChart".  Sign up information is provided on this After Visit Summary.  MyChart is used to connect with patients for Virtual Visits (Telemedicine).  Patients are able to view lab/test results, encounter notes, upcoming appointments, etc.  Non-urgent messages can be sent to your provider as well.   To learn more about what you can do with MyChart, go to [email or web address].    Your next appointment:   Keep scheduled follow-up

## 2022-06-09 LAB — BASIC METABOLIC PANEL
BUN/Creatinine Ratio: 20 (ref 10–24)
BUN: 25 mg/dL (ref 8–27)
CO2: 24 mmol/L (ref 20–29)
Calcium: 9.1 mg/dL (ref 8.6–10.2)
Chloride: 97 mmol/L (ref 96–106)
Creatinine, Ser: 1.26 mg/dL (ref 0.76–1.27)
Glucose: 232 mg/dL — ABNORMAL HIGH (ref 70–99)
Potassium: 4 mmol/L (ref 3.5–5.2)
Sodium: 137 mmol/L (ref 134–144)
eGFR: 60 mL/min/{1.73_m2} (ref >59)

## 2022-06-09 LAB — CBC
Hematocrit: 35.2 % — ABNORMAL LOW (ref 37.5–51.0)
Hemoglobin: 11.4 g/dL — ABNORMAL LOW (ref 13.0–17.7)
MCH: 28.6 pg (ref 26.6–33.0)
MCHC: 32.4 g/dL (ref 31.5–35.7)
MCV: 88 fL (ref 79–97)
Platelets: 229 10*3/uL (ref 150–450)
RBC: 3.98 x10E6/uL — ABNORMAL LOW (ref 4.14–5.80)
RDW: 13.2 % (ref 11.6–15.4)
WBC: 9.2 10*3/uL (ref 3.4–10.8)

## 2022-06-10 MED FILL — Potassium Chloride Inj 2 mEq/ML: INTRAVENOUS | Qty: 40 | Status: AC

## 2022-06-10 MED FILL — Heparin Sodium (Porcine) Inj 1000 Unit/ML: Qty: 1000 | Status: AC

## 2022-06-10 MED FILL — Magnesium Sulfate Inj 50%: INTRAMUSCULAR | Qty: 10 | Status: AC

## 2022-06-12 ENCOUNTER — Telehealth (HOSPITAL_COMMUNITY): Payer: Self-pay

## 2022-06-12 NOTE — Telephone Encounter (Signed)
Called patient to see if he is interested in the Cardiac Rehab Program. Patient expressed interest. Explained scheduling process and went over insurance, patient verbalized understanding. Will contact patient for scheduling once f/u has been completed.  °

## 2022-06-12 NOTE — Telephone Encounter (Signed)
Pt insurance is active and benefits verified through [facility]. Co-pay $35.00, DED $0.00/$0.00 met, out of pocket $4,500.00/$20.00 met, co-insurance 0%. No pre-authorization required. Passport, 06/12/2022 @ 2:16PM, [id number]   How many CR sessions are covered? (36 sessions for TCR, 72 sessions for ICR)72 Is this a lifetime maximum or an annual maximum? Lifetime Has the member used any of these services to date? No Is there a time limit (weeks/months) on start of program and/or program completion? No     Will contact patient to see if he is interested in the Cardiac Rehab Program. If interested, patient will need to complete follow up appt. Once completed, patient will be contacted for scheduling upon review by the RN Navigator.

## 2022-06-12 NOTE — Progress Notes (Unsigned)
Virtual Visit via Telephone Note   Because of Samaad L Aufiero III's co-morbid illnesses, he is at least at moderate risk for complications without adequate follow up.  This format is felt to be most appropriate for this patient at this time.  The patient did not have access to video technology/had technical difficulties with video requiring transitioning to audio format only (telephone).  All issues noted in this document were discussed and addressed.  No physical exam could be performed with this format.  Please refer to the patient's chart for his consent to telehealth for [facility].   Date:  06/12/2022   ID:  Ovidio L Tsao III, DOB Sep 23, 1945, MRN [id number] The patient was identified using 2 identifiers.  Patient Location: Home Provider Location: Office/Clinic   PCP:  Wendie Agreste, MD   [place] [facility] Cardiologist:  Will Meredith Leeds, MD Electrophysiologist:  Will Meredith Leeds, MD {    Evaluation Performed:  Follow-Up Visit  Chief Complaint:  AF with RVR  History of Present Illness:    Quirino L Runk III is a 77 y.o. male with  a hx of CAD s/p 3V CABG (2011), obesity, testicular cancer s/p surgery and radiation, HLD, atrial fib/flutter s/p ablation x3, mitral regurgitation/stenosis, IDDM, and severe aortic stenosis who presented to [facility] on 06/02/22 for planned TAVR who was recently seen in TOC follow up found to be in AF with RVR and is being seen today for close follow up/pre DCCV.    Mr. Loeb has been followed by Dr. Curt Bears for his cardiology care. He was referred to the structural heart team for evaluation of severe aortic stenosis 02/2022. In 2011 he underwent 3V CABG for severe CAD and has done well since that time. He has a hx of atrial flutter and has undergone AF ablation x3. More recently he has been having symptoms of dizziness and fatigue with subsequent echocardiogram 11/10/21 showing an LVEF at 55-60% with mitral stenosis,  moderately severe aortic stenosis with mean gradient of 26 mmHg, DI 0.19, SVI 30. AVA 0.8 cm2. TEE 03/13/22 confirmed mild mitral stenosis with severe aortic stenosis.    In consultation, he was having NYHA class II/III symptoms and felt to be a good candidate for TAVR. Repeat cardiac catheterization showed severe three vessel CAD s/p 3V CABG with 3/3 patent bypass grafts and severe low flow/low gradient aortic stenosis by echo. CT imaging showed anatomy suitable for S3UR 71m valve.    He was then evaluated by the multidisciplinary valve team and felt to have severe, symptomatic aortic stenosis and to be a suitable candidate.    He is now s/p successful TAVR with a 29 mm Edwards Sapien 3 THV via the TF approach on 06/02/22. Post operative echo with stable valve function with a mean gradient at 974mg with an AVA by VTI at 1.92cm2. Noted to have an episode of symptomatic AV block with dizziness and near syncope post TAVR. EP consulted and PPM placed 06/03/22. Post implant CXR with no acute findings and stable PPM placement. He was started on ASA monotherapy with no issues.    In recent follow up with myself, EKG showed atrial fibrillation with RVR with a rate at 131bpm with evidence of volume overload. Case discussed with Dr. CaCurt Bearsith [facility] and rate control for 3-4 weeks. Toprol was increased to 10013mD.   Today he reports that his HRs are improved but still having fatigue. Discussed option to perform TEE/DCCV  however he wishes to wait 3-4 weeks for DCCV only. Otherwise he has been watching his HR and BP closely. HR's running in the 80's mostly. Edema has improved. He denies chest pain, palpitations, orthopnea, blood in stool or urine, dizziness, or syncope.   Past Medical History:  Diagnosis Date   Anemia    Arthritis    "back, left ankle" (11/20/2015)   Cataract    CHF (congestive heart failure) (HCC)    Chronic lower back pain    Coronary artery disease    a. 05/2009  CABG x 3: LIMA->LAD, VG->OM, VG->PDA; b. Nuc 01/2014: inf-lat scar but no ischemia, EF 57%.   Diabetic retinopathy (HCC)    mild- Dr. Herbert Deaner   Glaucoma    Hyperlipidemia    a. Intolerant of lipitor and vytorin.   Lyme disease    Migraine    "once or twice" (11/20/2015)   Myocardial infarction [facility])    'saw evidence of it on an EKG done in 2005"   Paroxysmal atrial flutter ([place])    a. 01/2014 s/p DCCV;  b. CHA2DS2VASc = 3-->Eliquis.   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 MAngelena Formand Dr. BCyndia Bent  Sacral pain    "right"   Sciatica of right side    [place]   "heavy doses of radiation; it was stage 2"   Type II diabetes mellitus ([place]    Valvular heart disease    a. 01/2014 Echo: Ef 60-65%, no rwma, mild AI/MS, mod MR, mildly dil LA.   Past Surgical History:  Procedure Laterality Date   ABDOMINAL EXPLORATION SURGERY  ~ 2005   "for hernia, but didn't have one"   ATRIAL FIBRILLATION ABLATION N/A 09/17/2016   Procedure: Atrial Fibrillation Ablation;  Surgeon: CConstance Haw MD;  [place];  Service: Cardiovascular;  Laterality: N/A;   CARDIAC CATHETERIZATION  2011   CARDIOVERSION N/A 02/19/2014   Procedure: CARDIOVERSION;  Surgeon: HSinclair Grooms MD;  [place]  Service: Cardiovascular;  Laterality: N/A;   CARDIOVERSION N/A 10/28/2015   Procedure: CARDIOVERSION;  Surgeon: PThayer Headings MD;  [place]  Service: Cardiovascular;  Laterality: N/A;   CARDIOVERSION  11/20/2015   "200 joules"   CORONARY ARTERY BYPASS GRAFT  05/2009   LIMA-LAD, SVG-OM, SVG-PDA 06/20/09   ELBOW FRACTURE SURGERY Left    broken ulna on left elbow-surgical repair   ELECTROPHYSIOLOGIC STUDY N/A 10/31/2014   Procedure: A-Flutter;  Surgeon: GEvans Lance MD;  [place] [place];  Service: Cardiovascular;  Laterality: N/A;   ELECTROPHYSIOLOGIC STUDY  N/A 11/20/2015   Procedure: A-Flutter Ablation;  Surgeon: GEvans Lance MD;  [place] [facility];  Service: Cardiovascular;  Laterality: N/A;   EYE SURGERY     FRACTURE SURGERY     INTRAOPERATIVE TRANSTHORACIC ECHOCARDIOGRAM N/A 06/02/2022   Procedure: INTRAOPERATIVE TRANSTHORACIC ECHOCARDIOGRAM;  Surgeon: MBurnell Blanks MD;  [place]  Service: Open Heart Surgery;  Laterality: N/A;   PACEMAKER IMPLANT N/A 06/03/2022   Procedure: PACEMAKER IMPLANT;  Surgeon: CConstance Haw MD;  [place] [facility];  Service: Cardiovascular;  Laterality: N/A;   RIGHT/LEFT HEART CATH AND CORONARY/GRAFT ANGIOGRAPHY N/A 04/13/2022   Procedure: RIGHT/LEFT HEART CATH AND CORONARY/GRAFT ANGIOGRAPHY;  Surgeon: MBurnell Blanks MD;  [facility];  Service: Cardiovascular;  Laterality: N/A;   TEE WITHOUT CARDIOVERSION N/A  03/13/2022   Procedure: TRANSESOPHAGEAL ECHOCARDIOGRAM (TEE);  Surgeon: Sanda Klein, MD;  Location: [place];  Service: Cardiovascular;  Laterality: N/A;   TESTICLE REMOVAL Right 1982   TONSILLECTOMY  ~ [place], TRANSFEMORAL N/A 06/02/2022   Procedure: Transcatheter Aortic Valve Replacement, Transfemoral;  Surgeon: Burnell Blanks, MD;  Location: [place];  Service: Open Heart Surgery;  Laterality: N/A;     No outpatient medications have been marked as taking for the 06/15/22 encounter (Appointment) with [facility].     Allergies:   Actos [pioglitazone], Bee venom, Codeine, Metformin, Statins, Glucophage [metformin hcl], and Raspberry   Social History   Tobacco Use   Smoking status: Former    Packs/day: 0.50    Years: 15.00    Total pack years: 7.50    Types: Cigarettes    Quit date: 1990    Years since quitting: 34.1   Smokeless tobacco: Never   Tobacco comments:    "quit smoking cigarettes in the 1980's; don't know how much or for how long"  Vaping Use    Vaping Use: Never used  Substance Use Topics   Alcohol use: Yes    Alcohol/week: 3.0 standard drinks of alcohol    Types: 3 Cans of beer per week   Drug use: No    Comment: "quit smoking pot in the 1990s"     Family Hx: The patient's family history includes Diabetes in his father, mother, and sister; Heart attack in his father; Heart disease in his father and mother; Hypertension in his father.  ROS:   Please see the history of present illness.     All other systems reviewed and are negative.   Prior CV studies:   The following studies were reviewed today:  PPM 06/03/22:   CONCLUSIONS:   1. Successful implantation of a Abbott Assurity [id number] dual-chamber pacemaker for symptomatic bradycardia  2. No early apparent complications.       [facility]  TAVR OPERATIVE NOTE     Date of Procedure:                06/02/2022   Preoperative Diagnosis:      Severe Aortic Stenosis    Postoperative Diagnosis:    Same    Procedure:        Transcatheter Aortic Valve Replacement - Transfemoral Approach             Edwards Sapien 3 THV (size 29 mm, model # [id number], serial # [id number])              Co-Surgeons:                        Lauree Chandler, MD and Gilford Raid , MD    Anesthesiologist:                  Kalman Shan   Echocardiographer:              Croitoru   Pre-operative Echo Findings: Severe aortic stenosis Normal left ventricular systolic function   Post-operative Echo Findings: No paravalvular leak Normal left ventricular systolic function   _____________   Echo 06/03/22:    1. Left ventricular ejection fraction, by estimation, is 50 to 55%. The  left ventricle has low normal function. The left ventricle has no regional  wall motion abnormalities. There is mild concentric left ventricular  hypertrophy. Left ventricular  diastolic function could not be evaluated.  2. Right ventricular systolic function is normal. The right ventricular  size is  normal. Tricuspid regurgitation signal is inadequate for assessing  PA pressure.   3. Left atrial size was severely dilated.   4. Right atrial size was mildly dilated.   5. The mitral valve is degenerative. Trivial mitral valve regurgitation.  Mild mitral stenosis. The mean mitral valve gradient is 7.0 mmHg with  average heart rate of 99 bpm. Severe mitral annular calcification.   6. The aortic valve has been repaired/replaced. Aortic valve  regurgitation is not visualized. There is a 29 mm Sapien prosthetic (TAVR)  valve present in the aortic position. Procedure Date: 06/02/2022. Aortic  valve mean gradient measures 9.0 mmHg. Aortic  valve Vmax measures 1.92 m/s. Aortic valve acceleration time measures 74  msec.   7. The inferior vena cava is normal in size with greater than 50%  respiratory variability, suggesting right atrial pressure of 3 mmHg.   Labs/Other Tests and Data Reviewed:    EKG:  No ECG reviewed.  Recent Labs: 10/23/2021: Pro B Natriuretic peptide (BNP) 223.0; TSH 2.24 05/29/2022: ALT 15 06/03/2022: Magnesium 1.9 06/08/2022: BUN 25; Creatinine, Ser 1.26; Hemoglobin 11.4; Platelets 229; Potassium 4.0; Sodium 137   Recent Lipid Panel Lab Results  Component Value Date/Time   CHOL 228 (H) 07/07/2021 10:18 AM   CHOL 230 (H) 07/08/2020 01:18 PM   CHOL 216 (H) 06/21/2013 02:05 PM   TRIG 177.0 (H) 07/07/2021 10:18 AM   TRIG 132 06/21/2013 02:05 PM   HDL 45.40 07/07/2021 10:18 AM   HDL 50 07/08/2020 01:18 PM   HDL 54 06/21/2013 02:05 PM   CHOLHDL 5 07/07/2021 10:18 AM   LDLCALC 147 (H) 07/07/2021 10:18 AM   LDLCALC 164 (H) 07/08/2020 01:18 PM   LDLCALC 136 (H) 06/21/2013 02:05 PM    Wt Readings from Last 3 Encounters:  06/08/22 198 lb 12.8 oz (90.2 kg)  06/02/22 200 lb (90.7 kg)  05/20/22 196 lb (88.9 kg)       Objective:    Vital Signs:  There were no vitals taken for this visit.   VITAL SIGNS:  reviewed NEURO:  alert and oriented x 3, no obvious focal  deficit  ASSESSMENT & PLAN:    Hx of atrial flutter/atrial fibrillation: s/p multiple ablations and followed by Dr. Curt Bears. Seen for TOC s/p TAVR with EKG showing AF with RVR. Case discussed with Dr. Curt Bears with plan to start [facility] with Eliquis and rate control for 3-4 weeks. Increase Toprol increased to 153m QD. Reports improved HRs today in the 80s but continues to have fatigue. Plan DCCV in 3-4 weeks after adequate AC. Reports no missed doses.   Severe AS: s/p successful TAVR with a 29 mm Edwards Sapien 3 THV via the TF approach on 06/02/22. Post operative echo with stable valve function with a mean gradient at 923mg with an AVA by VTI at 1.92cm2. Will need lifelong dental SBE with Amoxicillin. PLan one month follow up with echocardiogram.    Symptomatic high grade AV block: Noted to have symptomatic AV block with dizziness and near syncope post TAVR. EP consulted and PPM placed 06/03/22. Has wound check 2/21.   Acute CHF: Reports improvement in LE edema today. Continue PRN Lasix.    CAD s/p 3V CABG (2011): Denies anginal symptoms. Continue current regimen. Pre TAVR cath with patent grafts.     IDDM: Follows with OP endocrinology.    HLD: Needs better control. [place]  MR/stenosis: Mild on recent TEE. Continue to follow with surveillance imaging.    Incidental findings: Small solid pulmonary nodule of the left upper lobe measuring 3 mm. No follow-up needed if patient is low-risk. Non-contrast chest CT can be considered in 12 months if patient is high-risk. Low risk, no follow up needed.    Elevated RAISE score: Cardiac Amyloid labs drawn while inpatient. Kappa light chain elevated at 38.2, kappa light chain ratio elevated at 1.93, immunofixation found to be unremarkable with no evidence of monoclonal protein. Case discussed with Dr. Daniel Nones and the patient. Will refer to Hematology and order PYP. Patient agreeable.    Shared Decision Making/Informed  Consent The risks (stroke, cardiac arrhythmias rarely resulting in the need for a temporary or permanent pacemaker, skin irritation or burns and complications associated with conscious sedation including aspiration, arrhythmia, respiratory failure and death), benefits (restoration of normal sinus rhythm) and alternatives of a direct current cardioversion were explained in detail to Mr. Mauger and he agrees to proceed.    Time:   Today, I have spent 15 minutes with the patient with telehealth technology discussing the above problems.     Medication Adjustments/Labs and Tests Ordered: Current medicines are reviewed at length with the patient today.  Concerns regarding medicines are outlined above.   Tests Ordered: No orders of the defined types were placed in this encounter.   Medication Changes: No orders of the defined types were placed in this encounter.   Follow Up:  In Person  with myself 3/8  Signed, Kathyrn Drown, NP  06/12/2022 2:40 PM    [place]

## 2022-06-15 ENCOUNTER — Ambulatory Visit: Payer: Medicare HMO | Attending: Cardiovascular Disease

## 2022-06-15 ENCOUNTER — Ambulatory Visit (INDEPENDENT_AMBULATORY_CARE_PROVIDER_SITE_OTHER): Payer: Medicare HMO | Admitting: Family Medicine

## 2022-06-15 VITALS — BP 132/80 | HR 88 | Temp 97.8°F | Ht 66.0 in | Wt 198.6 lb

## 2022-06-15 DIAGNOSIS — I480 Paroxysmal atrial fibrillation: Secondary | ICD-10-CM | POA: Diagnosis not present

## 2022-06-15 DIAGNOSIS — Z952 Presence of prosthetic heart valve: Secondary | ICD-10-CM | POA: Diagnosis not present

## 2022-06-15 DIAGNOSIS — I4891 Unspecified atrial fibrillation: Secondary | ICD-10-CM | POA: Diagnosis not present

## 2022-06-15 DIAGNOSIS — D649 Anemia, unspecified: Secondary | ICD-10-CM | POA: Diagnosis not present

## 2022-06-15 DIAGNOSIS — I43 Cardiomyopathy in diseases classified elsewhere: Secondary | ICD-10-CM

## 2022-06-15 DIAGNOSIS — I350 Nonrheumatic aortic (valve) stenosis: Secondary | ICD-10-CM | POA: Diagnosis not present

## 2022-06-15 DIAGNOSIS — E854 Organ-limited amyloidosis: Secondary | ICD-10-CM

## 2022-06-15 DIAGNOSIS — I10 Essential (primary) hypertension: Secondary | ICD-10-CM

## 2022-06-15 DIAGNOSIS — I2510 Atherosclerotic heart disease of native coronary artery without angina pectoris: Secondary | ICD-10-CM

## 2022-06-15 DIAGNOSIS — I509 Heart failure, unspecified: Secondary | ICD-10-CM | POA: Diagnosis not present

## 2022-06-15 DIAGNOSIS — E1165 Type 2 diabetes mellitus with hyperglycemia: Secondary | ICD-10-CM | POA: Diagnosis not present

## 2022-06-15 NOTE — Progress Notes (Unsigned)
Subjective:  Patient ID: Matthew [name], male    DOB: 12/08/1945  Age: 77 y.o. MRN: [id number]  CC:  Chief Complaint  Patient presents with   Post-op Problem    Pt notes history of Afib and cardiology requested EKG     HPI Matthew [name] presents for   Cardiac: Status post aortic valve replacement on February 6 for prior severe aortic stenosis.  Cardiology note from February 12 reviewed, EKG showed A-fib with RVR.  Aspirin was discontinued, started on Eliquis 5 mg twice daily.  Plan for cardioversion in 3 to 4 weeks after adequate anticoagulation.  Then if no improvement in rate plan for TEE/DCCV.  Lifelong dental antibiotic prophylaxis with amoxicillin.  Also had symptomatic AV block with dizziness, near syncope status post TAVR.  EP was consulted and PPM placed 06/03/2022.  Device was interrogated showing A-fib starting February 10. He has had history of A-fib and multiple ablations.  Rate control with Toprol, increase to 100 mg daily and Eliquis started 1 week ago.  Telehealth visit planned with cardiology after 1 week. Also noted to be volume overloaded in the setting of A-fib with RVR, treated with Lasix 40 mg x 3 days then as needed use. Cardiac amyloid labs were drawn while inpatient with elevated kappa light chain and ratio.  Immunofixation without evidence of monoclonal protein, myeloma panel was ordered.  Plan for possible PYP scanning -  Phone call with Kathyrn Drown.  HR in 80's, persistent fatigue, on toprol 186m. On eliquis, no blood in urine or stool  referred to hematology for further testing for kappa light chain elevation.  Repeat echo in 1 month. Planned 3/7 cardioversion.  No angina, min dyspnea, weak.  Using lasix once per week - not since 3 day use last week. Less ankle swelling. Not swollen in am.  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   Severe aortic stenosis 04/13/2022   Aortic stenosis 03/27/2022   Nonrheumatic aortic valve stenosis 03/13/2022   Mitral valve stenosis, non-rheumatic 03/13/2022   Myopathy, unspecified 07/09/2020   Statin myopathy 09/15/2019   Atrial flutter ([facility] 10/31/2014   Coronary artery disease    Paroxysmal atrial flutter ([facility] [place]    Diabetes mellitus without complication ([facility]    Valvular heart disease    Atrial fibrillation ([facility] 01/25/2014   Diabetes mellitus, type 2 ([facility] [place] 01/16/2014   Hyperlipidemia 01/16/2014   Erectile dysfunction associated with type 2 diabetes mellitus ([facility] 01/16/2014   Coronary atherosclerosis of native coronary artery 06/30/2013   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 05/2009 CABG x 3: LIMA->LAD, VG->OM, VG->PDA; b. Nuc 01/2014: inf-lat scar but no ischemia, EF 57%.   Diabetic retinopathy (HCC)    mild- Dr. HHerbert Deaner  Glaucoma    Hyperlipidemia    a. Intolerant of lipitor and vytorin.   Lyme disease    Migraine    "once or twice" (11/20/2015)   Myocardial infarction ([facility]    'saw evidence of it on an EKG done in 2005"   Paroxysmal  atrial flutter ([place])    a. 01/2014 s/p DCCV;  b. CHA2DS2VASc = 3-->Eliquis.   Pneumonia 1960   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   66m S3UR via TF approach with Dr. MAngelena Formand Dr. BCyndia Bent  Sacral pain    "right"   Sciatica of right side    Testicular cancer ([facility] 1982   "heavy doses of radiation; it was stage 2"   Type II diabetes mellitus ([facility]    Valvular heart disease    a. 01/2014 Echo: Ef 60-65%, no rwma, mild AI/MS, mod MR, mildly dil LA.   Past Surgical History:  Procedure Laterality Date    ABDOMINAL EXPLORATION SURGERY  ~ 2005   "for hernia, but didn't have one"   ATRIAL FIBRILLATION ABLATION N/A 09/17/2016   Procedure: Atrial Fibrillation Ablation;  Surgeon: CConstance Haw MD;  [facility];  Service: Cardiovascular;  Laterality: N/A;   CARDIAC CATHETERIZATION  2011   CARDIOVERSION N/A 02/19/2014   Procedure: CARDIOVERSION;  Surgeon: HSinclair Grooms MD;  [place] [facility]  Service: Cardiovascular;  Laterality: N/A;   CARDIOVERSION N/A 10/28/2015   Procedure: CARDIOVERSION;  Surgeon: PThayer Headings MD;  [place] [facility]  Service: Cardiovascular;  Laterality: N/A;   CARDIOVERSION  11/20/2015   "200 joules"   CORONARY ARTERY BYPASS GRAFT  05/2009   LIMA-LAD, SVG-OM, SVG-PDA 06/20/09   ELBOW FRACTURE SURGERY Left    broken ulna on left elbow-surgical repair   ELECTROPHYSIOLOGIC STUDY N/A 10/31/2014   Procedure: A-Flutter;  Surgeon: GEvans Lance MD;  [facility];  Service: Cardiovascular;  Laterality: N/A;   ELECTROPHYSIOLOGIC STUDY N/A 11/20/2015   Procedure: A-Flutter Ablation;  Surgeon: GEvans Lance MD;  [facility];  Service: Cardiovascular;  Laterality: N/A;   EYE SURGERY     FRACTURE SURGERY     INTRAOPERATIVE TRANSTHORACIC ECHOCARDIOGRAM N/A 06/02/2022   Procedure: INTRAOPERATIVE TRANSTHORACIC ECHOCARDIOGRAM;  Surgeon: MBurnell Blanks MD;  Location: [facility] [facility]  Service: Open Heart Surgery;  Laterality: N/A;   PACEMAKER IMPLANT N/A 06/03/2022   Procedure: PACEMAKER IMPLANT;  Surgeon: CConstance Haw MD;  [facility];  Service: Cardiovascular;  Laterality: N/A;   RIGHT/LEFT HEART CATH AND CORONARY/GRAFT ANGIOGRAPHY N/A 04/13/2022   Procedure: RIGHT/LEFT HEART CATH AND CORONARY/GRAFT ANGIOGRAPHY;  Surgeon: MBurnell Blanks MD;  [facility];  Service: Cardiovascular;  Laterality: N/A;   TEE WITHOUT CARDIOVERSION N/A 03/13/2022   Procedure:  TRANSESOPHAGEAL ECHOCARDIOGRAM (TEE);  Surgeon: CSanda Klein MD;  [place]  Service: Cardiovascular;  Laterality: N/A;   TESTICLE REMOVAL Right 1982   TONSILLECTOMY  ~ [facility] TRANSFEMORAL N/A 06/02/2022   Procedure: Transcatheter Aortic Valve Replacement, Transfemoral;  Surgeon: MBurnell Blanks MD;  Location: [facility]  Service: Open Heart Surgery;  Laterality: N/A;   Allergies  Allergen Reactions   Actos [Pioglitazone] Swelling   Bee Venom Swelling   Codeine Hives and Nausea Only   Metformin Nausea Only   Statins Other (See Comments)    Muscle pain, fatigue   Glucophage [Metformin Hcl] Nausea Only   Raspberry Itching   Prior to Admission medications   Medication Sig Start Date End Date Taking? Authorizing Provider  apixaban (ELIQUIS) 5 MG TABS tablet Take 1 tablet (5 mg total) by mouth 2 (two) times daily. 06/08/22  Yes MTommie Raymond NP  Aspirin-Caffeine (BAYER BACK & BODY) 500-32.5 MG TABS Take  1 tablet by mouth daily as needed (pain).   Yes [provider]  Continuous Blood Gluc Receiver (DEXCOM G6 RECEIVER) DEVI Use to receive data from sensor 05/23/21  Yes Elayne Snare, MD  Continuous Blood Gluc Sensor (DEXCOM G6 SENSOR) MISC Use to monitor blood sugar, change after 10 days 05/23/21  Yes Elayne Snare, MD  dorzolamide-timolol (COSOPT) 22.3-6.8 MG/ML ophthalmic solution Place 1 drop into the left eye 2 (two) times daily. 10/01/21  Yes [provider]  furosemide (LASIX) 40 MG tablet Take 1 tablet (40 mg total) by mouth daily as needed. Patient taking differently: Take 40 mg by mouth daily as needed for fluid. 08/14/21  Yes Camnitz, Will Hassell Done, MD  insulin aspart (NOVOLOG) 100 UNIT/ML injection Inject 18-20 Units into the skin 3 (three) times daily with meals. Patient taking differently: Inject 15-20 Units into the skin 3 (three) times daily with meals. Sliding scale 03/16/22  Yes Elayne Snare, MD  Insulin  Syringe-Needle U-100 (BD VEO INSULIN SYRINGE U/F) 31G X 15/64" 0.5 ML MISC Use to inject 4 times a day 08/11/21  Yes Elayne Snare, MD  latanoprost (XALATAN) 0.005 % ophthalmic solution Place 1 drop into the left eye at bedtime. 04/01/21  Yes [provider]  lisinopril (ZESTRIL) 5 MG tablet TAKE 1 TABLET BY MOUTH EVERY DAY 02/16/22  Yes Camnitz, Ocie Doyne, MD  metoprolol succinate (TOPROL-XL) 100 MG 24 hr tablet Take 1 tablet (100 mg total) by mouth daily. Take with or immediately following a meal. 06/08/22  Yes Kathyrn Drown D, NP  NOVOLIN N 100 UNIT/ML injection INJECT 22 UNITS IN THE SKIN DAILY AT BEDTIME. Patient taking differently: 20 Units at bedtime. 05/04/22  Yes Elayne Snare, MD  potassium chloride (KLOR-CON 10) 10 MEQ tablet Take 1 tablet (10 mEq total) by mouth as needed. With Lasix Patient taking differently: Take 10 mEq by mouth daily as needed (when taking lasix). 11/03/21  Yes Baldwin Jamaica, PA-C   Social History   Socioeconomic History   Marital status: Married    Spouse name: Not on file   Number of children: 0   Years of education: Not on file   Highest education level: Not on file  Occupational History   Occupation: Photo journalist  Tobacco Use   Smoking status: Former    Packs/day: 0.50    Years: 15.00    Total pack years: 7.50    Types: Cigarettes    Quit date: 1990    Years since quitting: 34.1   Smokeless tobacco: Never   Tobacco comments:    "quit smoking cigarettes in the 1980's; don't know how much or for how long"  Vaping Use   Vaping Use: Never used  Substance and Sexual Activity   Alcohol use: Yes    Alcohol/week: 3.0 standard drinks of alcohol    Types: 3 Cans of beer per week   Drug use: No    Comment: "quit smoking pot in the 1990s"   Sexual activity: Not Currently    Birth control/protection: Coitus interruptus  Other Topics Concern   Not on file  Social History Narrative   Not on file   Social Determinants of Health   Financial  Resource Strain: Not on file  Food Insecurity: No Food Insecurity (06/05/2022)   Hunger Vital Sign    Worried About Running Out of Food in the Last Year: Never true    Ran Out of Food in the Last Year: Never true  Transportation Needs: No Transportation Needs (06/05/2022)  PRAPARE - Hydrologist (Medical): No    Lack of Transportation (Non-Medical): No  Physical Activity: Not on file  Stress: Not on file  Social Connections: Not on file  Intimate Partner Violence: Not At Risk (03/28/2022)   Humiliation, Afraid, Rape, and Kick questionnaire    Fear of Current or Ex-Partner: No    Emotionally Abused: No    Physically Abused: No    Sexually Abused: No    Review of Systems  Per HPI.  Objective:   Vitals:   06/15/22 1428  BP: 132/80  Pulse: 88  Temp: 97.8 F (36.6 C)  TempSrc: Temporal  SpO2: 94%  Weight: 198 lb 9.6 oz (90.1 kg)  Height: 5' 6"$  (1.676 m)     Physical Exam Vitals reviewed.  Constitutional:      Appearance: He is well-developed.  HENT:     Head: Normocephalic and atraumatic.  Neck:     Vascular: No carotid bruit or JVD.  Cardiovascular:     Rate and Rhythm: Normal rate and regular rhythm.     Heart sounds: Normal heart sounds. No murmur heard. Pulmonary:     Effort: Pulmonary effort is normal.     Breath sounds: Normal breath sounds. No rales.  Musculoskeletal:     Right lower leg: No edema.     Left lower leg: No edema.  Skin:    General: Skin is warm and dry.  Neurological:     Mental Status: He is alert and oriented to person, place, and time.  Psychiatric:        Mood and Affect: Mood normal.     EKG: Sinus rhythm, rate 71, appears to be sinus rhythm, RBBB, lower rate, and appears to be in SR compared to 06/08/22 EKG, but some baseline artifact.  Wt Readings from Last 3 Encounters:  06/15/22 198 lb 9.6 oz (90.1 kg)  06/08/22 198 lb 12.8 oz (90.2 kg)  06/02/22 200 lb (90.7 kg)     Assessment & Plan:  Matthew [name] is a 77 y.o. male . Status post aortic valve replacement  -Stable, improving postsurgery.  No fevers, no chest pain.  Some dyspnea that has been stable, lungs were clear.  Continue follow-up as scheduled with cardiac care.   Atrial fibrillation, unspecified type ([place])  -Baseline artifact in the office EKG but appears to be in sinus rhythm.  EKG forwarded to cardiology.  Currently slated for cardioversion next month.  Possible paroxysmal A-fib and may still need to proceed with that procedure but will defer to cardiology.  Tolerating anticoagulation and rate controlled at this time.  Blood pressure stable.  Congestive heart failure, unspecified HF chronicity, unspecified heart failure type ([place])  -Some pedal edema but lungs clear.  Weight stable.  Has furosemide if needed, reports pedal edema improves overnight.  RTC precautions, ER precautions given and monitor for acute weight changes.  No orders of the defined types were placed in this encounter.  Patient Instructions  Heart rhythm appears regular and you appear to be in regular rhythm today.  I will make sure cardiology has a copy of this EKG.  Keep follow-up as planned with them but no med changes for now.  Furosemide if needed for more leg swelling or if that does not improve overnight.  Keep follow-up with specialist as planned but let me know if there are any questions.  Follow-up with me in 3 months but I am happy to see you sooner  if needed.     Signed,   Merri Ray, MD [place] 06/15/22 2:52 PM

## 2022-06-15 NOTE — Patient Instructions (Signed)
Heart rhythm appears regular and you appear to be in regular rhythm today.  I will make sure cardiology has a copy of this EKG.  Keep follow-up as planned with them but no med changes for now.  Furosemide if needed for more leg swelling or if that does not improve overnight.  Keep follow-up with specialist as planned but let me know if there are any questions.  Follow-up with me in 3 months but I am happy to see you sooner if needed.

## 2022-06-15 NOTE — Patient Instructions (Addendum)
Medication Instructions:  Your physician recommends that you continue on your current medications as directed. Please refer to the Current Medication list given to you today.  *If you need a refill on your cardiac medications before your next appointment, please call your pharmacy*   Lab Work: NONE If you have labs (blood work) drawn today and your tests are completely normal, you will receive your results only by: [place] (if you have MyChart) OR A paper copy in the mail If you have any lab test that is abnormal or we need to change your treatment, we will call you to review the results.  Testing/Procedures:    Dear Eino L Scheu III  You are scheduled for a TEE (Transesophageal Echocardiogram) on Thursday, March 7 with Dr. Harrell Gave.  Please arrive at the [facility] (Main Entrance A) at [facility]: [address] [place], [place] [address] at 7:30 AM.   DIET:  Nothing to eat or drink after midnight except a sip of water with medications (see medication instructions below)  MEDICATION INSTRUCTIONS:       Continue taking your anticoagulant (blood thinner): Apixaban (Eliquis).  You will need to continue this after your procedure until you are told by your provider that it is safe to stop.    FYI:  For your safety, and to allow [place] to monitor your vital signs accurately during the surgery/procedure we request: If you have artificial nails, gel coating, SNS etc, please have those removed prior to your surgery/procedure. Not having the nail coverings /polish removed may result in cancellation or delay of your surgery/procedure.  You must have a responsible person to drive you home and stay in the waiting area during your procedure. Failure to do so could result in cancellation.  Bring your insurance cards.  *Special Note: Every effort is made to have your procedure done on time. Occasionally there are emergencies that occur at the hospital that may cause delays.  Please be patient if a delay does occur.    Follow-Up: At [facility], you and your health needs are our priority.  As part of our continuing mission to provide you with exceptional heart care, we have created designated Provider Care Teams.  These Care Teams include your primary Cardiologist (physician) and Advanced Practice Providers (APPs -  Physician Assistants and Nurse Practitioners) who all work together to provide you with the care you need, when you need it.  We recommend signing up for the patient portal called "MyChart".  Sign up information is provided on this After Visit Summary.  MyChart is used to connect with patients for Virtual Visits (Telemedicine).  Patients are able to view lab/test results, encounter notes, upcoming appointments, etc.  Non-urgent messages can be sent to your provider as well.   To learn more about what you can do with MyChart, go to [email or web address].    Your next appointment:   KEEP SCHEDULED FOLLOW-UP   Other Instructions YOU HAVE BEEN REFERRED TO  HEMATOLOGY

## 2022-06-16 ENCOUNTER — Encounter: Payer: Self-pay | Admitting: Family Medicine

## 2022-06-17 ENCOUNTER — Telehealth: Payer: Self-pay | Admitting: Hematology and Oncology

## 2022-06-17 ENCOUNTER — Ambulatory Visit: Payer: Medicare HMO | Attending: Internal Medicine

## 2022-06-17 DIAGNOSIS — I442 Atrioventricular block, complete: Secondary | ICD-10-CM

## 2022-06-17 LAB — CUP PACEART INCLINIC DEVICE CHECK
Brady Statistic RA Percent Paced: 3 %
Brady Statistic RV Percent Paced: 1 % — CL
Date Time Interrogation Session: 20240221165230
Implantable Lead Connection Status: 753985
Implantable Lead Connection Status: 753985
Implantable Lead Implant Date: 20240207
Implantable Lead Implant Date: 20240207
Implantable Lead Location: 753859
Implantable Lead Location: 753860
Implantable Pulse Generator Implant Date: 20240207
Lead Channel Impedance Value: 440 Ohm
Lead Channel Impedance Value: 600 Ohm
Lead Channel Pacing Threshold Amplitude: 0.75 V
Lead Channel Pacing Threshold Amplitude: 0.75 V
Lead Channel Pacing Threshold Amplitude: 0.75 V
Lead Channel Pacing Threshold Pulse Width: 0.4 ms
Lead Channel Pacing Threshold Pulse Width: 0.5 ms
Lead Channel Pacing Threshold Pulse Width: 0.5 ms
Lead Channel Sensing Intrinsic Amplitude: 12 mV
Lead Channel Sensing Intrinsic Amplitude: 2.2 mV
Pulse Gen Model: 2272

## 2022-06-17 NOTE — Progress Notes (Addendum)
Wound check appointment. Steri-strips removed. Wound without redness or edema. Incision edges approximated, wound well healed. Normal device function. Thresholds, sensing, and impedances consistent with implant measurements. Device programmed at 3.5V/auto capture programmed on for extra safety margin until 3 month visit. Histogram distribution appropriate for patient and level of activity.   AT/AF burden is 16% since implant on 06/03/22; however, there has been 0% AF burden since last episode on 2/12.  Today patient is in NSR 60 bpm.   2 recorded atrial episodes since implant appear as 1 10sec event of an Atach/flutter with rate max of A rate of 185bpm.  Episode 2 beginning on 2/10 recorded 2 days of flagged AF but no egm to accompany.  Overall, not very good ventricular rate control during AF.   Patient educated about wound care, arm mobility, lifting restrictions. ROV  in 3 months with implanting physician.  Patient is scheduled for a cardioversion in March, will make providers aware patient is back in NSR at the moment.

## 2022-06-17 NOTE — Patient Instructions (Signed)
   After Your Pacemaker   Monitor your pacemaker site for redness, swelling, and drainage. Call the device clinic at [phone] if you experience these symptoms or fever/chills.  Your incision was closed with Steri-strips or staples:  You may shower 7 days after your procedure and wash your incision with soap and water. Avoid lotions, ointments, or perfumes over your incision until it is well-healed.  You may use a hot tub or a pool after your wound check appointment if the incision is completely closed.  Do not lift, push or pull greater than 10 pounds with the affected arm until 6 weeks after your procedure UNTIL AFTER MARCH 20TH.   You may drive, unless driving has been restricted by your healthcare providers.  Remote monitoring is used to monitor your pacemaker from home. This monitoring is scheduled every 91 days by our office. It allows [place] to keep an eye on the functioning of your device to ensure it is working properly. You will routinely see your Electrophysiologist annually (more often if necessary).

## 2022-06-17 NOTE — Telephone Encounter (Signed)
Scheduled appt per 2/19 referral. Pt is aware of appt date and time. Pt is aware to arrive 15 mins prior to appt time and to bring and updated insurance card. Pt is aware of appt location.

## 2022-06-19 ENCOUNTER — Other Ambulatory Visit: Payer: Medicare HMO

## 2022-06-24 ENCOUNTER — Encounter: Payer: Self-pay | Admitting: Endocrinology

## 2022-06-24 ENCOUNTER — Other Ambulatory Visit: Payer: Self-pay

## 2022-06-24 ENCOUNTER — Ambulatory Visit: Payer: Medicare HMO | Admitting: Endocrinology

## 2022-06-24 VITALS — BP 144/76 | HR 72 | Ht 66.0 in | Wt 194.2 lb

## 2022-06-24 DIAGNOSIS — Z794 Long term (current) use of insulin: Secondary | ICD-10-CM

## 2022-06-24 DIAGNOSIS — E1165 Type 2 diabetes mellitus with hyperglycemia: Secondary | ICD-10-CM

## 2022-06-24 DIAGNOSIS — E7800 Pure hypercholesterolemia, unspecified: Secondary | ICD-10-CM

## 2022-06-24 LAB — POCT GLYCOSYLATED HEMOGLOBIN (HGB A1C): Hemoglobin A1C: 7.8 % — AB (ref 4.0–5.6)

## 2022-06-24 MED ORDER — PEN NEEDLES 32G X 4 MM MISC: 3 refills | Status: DC

## 2022-06-24 NOTE — Progress Notes (Signed)
Patient ID: Matthew [name], male   DOB: June 02, 1945, 77 y.o.   MRN: [id number]           Reason for Appointment: Followup for Type 2 Diabetes    History of Present Illness:          Diagnosis: Type 2 diabetes mellitus, date of diagnosis:  1992      Past history: His blood sugar was high at diagnosis when he was having a routine screening done. He was started on Glucophage initially which he took for about a year. He thinks it did not help his sugar much and he did not feel good with it Apparently he was trying to control his diabetes with diet and exercise for a few years. He may have tried Glucophage again before going on insulin. Also was started on Actos which caused swelling He has been treated mostly with premixed insulin for about 15 years and his level of control appears to be inadequate although details of previous treatment are not available. He may have taken Humalog at one time for use with high sugars A1c was 11.5 in 2011 On his initial consultation in 9/15 because of poor control with premixed insulin he was switched to Levemir twice a day and Humalog with meals.  Recent history:   INSULIN regimen is described as: 20-24 N at 11 pm ; Novolog 15-16 units with meals correction dose 5 units   Current management, blood sugar patterns, current level of control and problems:  His A1c is now 7.8  He was given a trial of Levemir again at his last visit but he felt that it was not enough to control his blood sugars and went back to NPH without letting us know about following up as directed He now says that he takes NPH at 1 in the morning which he thinks is working better but he will take a higher dose when he is eating a larger meal Mostly taking 15 units with his meals for the NovoLog He thinks he is taking this when he starts eating His CGM shows highly inconsistent blood sugars with mostly high postprandial readings However also most of his readings overnight are high but  showing significant variability Unclear whether his NPH is working 24 hours but blood sugars are generally higher postprandially and also early part of the night He is again reluctant to consider insulin pump    With hypoglycemia he has symptoms of weakness, sweating, shakiness and confusion Treating low sugars with orange juice       Oral hypoglycemic drugs the patient is taking are: None       Side effects from medications have been: Edema from Actos,? Nausea and malaise from metformin, polyuria from [place]  Compliance with the medical regimen: Fair  Interpretation of the CGM from [facility] for the last 2 weeks is as follows  High variability is present with standard deviation 59 HYPERGLYCEMIC episodes are occurring at all different times; in the first week of the data he was having most of the episodes midmorning and evening after dinner In the second week his hyperglycemia is occurring either OVERNIGHT, after breakfast or early evenings but variable from day-to-day  OVERNIGHT blood sugars are generally averaging about 180 but very inconsistent and generally higher in the second week  POSTPRANDIAL blood sugars are generally higher after meals as discussed above and appears to be rising after late morning and early evening meals but generally higher readings in the first week  AVERAGE blood  sugar after BREAKFAST = 233 and after dinner 200 Hypoglycemia has been documented transiently overnight, early afternoon but generally minimal, not postprandially   CGM use % of time   2-week average/GV 181/33  Time in range       47%  % Time Above 180 39  % Time above 250 13  % Time Below 70 1     Previous data:  CGM use % of time 93  2-week average/GV 197/86  Time in range     45   %  % Time Above 180 32+24  % Time above 250   % Time Below 70 1    Self-care:   Meals: 2-3 meals per day.  Bfst 8:30-9, 1 pm Dinner is about 8 PM.  Some meals are high fat        Dietician visit, most  recent:?  15 years ago.      CDE visit: 3/16            Weight history:  Wt Readings from Last 3 Encounters:  06/24/22 194 lb 3.2 oz (88.1 kg)  06/15/22 198 lb 9.6 oz (90.1 kg)  06/08/22 198 lb 12.8 oz (90.2 kg)    Glycemic control:   Lab Results  Component Value Date   HGBA1C 7.8 (A) 06/24/2022   HGBA1C 8.1 (H) 12/15/2021   HGBA1C 9.0 (H) 07/07/2021   Lab Results  Component Value Date   MICROALBUR 13.3 (H) 12/17/2021   LDLCALC 147 (H) 07/07/2021   CREATININE 1.26 06/08/2022    Office Visit on 06/24/2022  Component Date Value Ref Range Status   Hemoglobin A1C 06/24/2022 7.8 (A)  4.0 - 5.6 % Final      Allergies as of 06/24/2022       Reactions   Actos [pioglitazone] Swelling   Bee Venom Swelling   Codeine Hives, Nausea Only   Metformin Nausea Only   Statins Other (See Comments)   Muscle pain, fatigue   Glucophage [metformin Hcl] Nausea Only   Raspberry Itching        Medication List        Accurate as of June 24, 2022  3:20 PM. If you have any questions, ask your nurse or doctor.          apixaban 5 MG Tabs tablet Commonly known as: ELIQUIS Take 1 tablet (5 mg total) by mouth 2 (two) times daily.   Bayer Back & Body 500-32.5 MG Tabs Generic drug: Aspirin-Caffeine Take 1 tablet by mouth daily as needed (pain).   BD Veo Insulin Syringe U/F 31G X 15/64" 0.5 ML Misc Generic drug: Insulin Syringe-Needle U-100 Use to inject 4 times a day   Dexcom G6 Receiver Devi Use to receive data from sensor   [place] by Does not apply route.   Dexcom G6 Sensor Misc Use to monitor blood sugar, change after 10 days   dorzolamide-timolol 2-0.5 % ophthalmic solution Commonly known as: COSOPT Place 1 drop into the left eye 2 (two) times daily.   furosemide 40 MG tablet Commonly known as: LASIX Take 1 tablet (40 mg total) by mouth daily as needed. What changed: reasons to take this   insulin aspart 100 UNIT/ML injection Commonly known  as: NovoLOG Inject 18-20 Units into the skin 3 (three) times daily with meals. What changed:  how much to take additional instructions   latanoprost 0.005 % ophthalmic solution Commonly known as: XALATAN Place 1 drop into the left eye at bedtime.  lisinopril 5 MG tablet Commonly known as: ZESTRIL TAKE 1 TABLET BY MOUTH EVERY DAY   metoprolol succinate 100 MG 24 hr tablet Commonly known as: TOPROL-XL Take 1 tablet (100 mg total) by mouth daily. Take with or immediately following a meal.   NovoLIN N 100 UNIT/ML injection Generic drug: insulin NPH Human INJECT 22 UNITS IN THE SKIN DAILY AT BEDTIME. What changed: See the new instructions.   potassium chloride 10 MEQ tablet Commonly known as: Klor-Con 10 Take 1 tablet (10 mEq total) by mouth as needed. With Lasix What changed:  when to take this reasons to take this additional instructions        Allergies:  Allergies  Allergen Reactions   Actos [Pioglitazone] Swelling   Bee Venom Swelling   Codeine Hives and Nausea Only   Metformin Nausea Only   Statins Other (See Comments)    Muscle pain, fatigue   Glucophage [Metformin Hcl] Nausea Only   Raspberry Itching    Past Medical History:  Diagnosis Date   Anemia    Arthritis    "back, left ankle" (11/20/2015)   Cataract    CHF (congestive heart failure) (HCC)    Chronic lower back pain    Coronary artery disease    a. 05/2009 CABG x 3: LIMA->LAD, VG->OM, VG->PDA; b. Nuc 01/2014: inf-lat scar but no ischemia, EF 57%.   Diabetic retinopathy (HCC)    mild- Dr. Herbert Deaner   Glaucoma    Hyperlipidemia    a. Intolerant of lipitor and vytorin.   Lyme disease    Migraine    "once or twice" (11/20/2015)   Myocardial infarction [facility])    'saw evidence of it on an EKG done in 2005"   Paroxysmal atrial flutter ([place])    a. 01/2014 s/p DCCV;  b. CHA2DS2VASc = 3-->Eliquis.   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 MAngelena Formand Dr. 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 01/2014 Echo: Ef 60-65%, no rwma, mild AI/MS, mod MR, mildly dil LA.    Past Surgical History:  Procedure Laterality Date   ABDOMINAL EXPLORATION SURGERY  ~ 2005   "for hernia, but didn't have one"   ATRIAL FIBRILLATION ABLATION N/A 09/17/2016   Procedure: Atrial Fibrillation Ablation;  Surgeon: CConstance Haw MD;  [facility];  Service: Cardiovascular;  Laterality: N/A;   CARDIAC CATHETERIZATION  2011   CARDIOVERSION N/A 02/19/2014   Procedure: CARDIOVERSION;  Surgeon: HSinclair Grooms MD;  [place]  Service: Cardiovascular;  Laterality: N/A;   CARDIOVERSION N/A 10/28/2015   Procedure: CARDIOVERSION;  Surgeon: PThayer Headings MD;  [place] [facility]  Service: Cardiovascular;  Laterality: N/A;   CARDIOVERSION  11/20/2015   "200 joules"   CORONARY ARTERY BYPASS GRAFT  05/2009   LIMA-LAD, SVG-OM, SVG-PDA 06/20/09   ELBOW FRACTURE SURGERY Left    broken ulna on left elbow-surgical repair   ELECTROPHYSIOLOGIC STUDY N/A 10/31/2014   Procedure: A-Flutter;  Surgeon: GEvans Lance MD;  [place];  Service: Cardiovascular;  Laterality: N/A;   ELECTROPHYSIOLOGIC STUDY N/A 11/20/2015   Procedure: A-Flutter Ablation;  Surgeon: GEvans Lance MD;  [facility];  Service: Cardiovascular;  Laterality: N/A;   EYE SURGERY     FRACTURE SURGERY     INTRAOPERATIVE TRANSTHORACIC ECHOCARDIOGRAM N/A 06/02/2022   Procedure: INTRAOPERATIVE TRANSTHORACIC ECHOCARDIOGRAM;  Surgeon: Burnell Blanks, MD;  Location: [place];  Service: Open Heart Surgery;  Laterality: N/A;   PACEMAKER IMPLANT N/A 06/03/2022   Procedure: PACEMAKER IMPLANT;  Surgeon: [name], MD;  Location: [place] [facility]  LAB;  Service: Cardiovascular;  Laterality: N/A;   RIGHT/LEFT HEART CATH AND CORONARY/GRAFT ANGIOGRAPHY N/A 04/13/2022   Procedure: RIGHT/LEFT HEART CATH AND CORONARY/GRAFT ANGIOGRAPHY;  Surgeon: Burnell Blanks, MD;  Location: [place] CV LAB;  Service: Cardiovascular;  Laterality: N/A;   TEE WITHOUT CARDIOVERSION N/A 03/13/2022   Procedure: TRANSESOPHAGEAL ECHOCARDIOGRAM (TEE);  Surgeon: Sanda Klein, MD;  Location: [place];  Service: Cardiovascular;  Laterality: N/A;   TESTICLE REMOVAL Right 1982   TONSILLECTOMY  ~ [place], TRANSFEMORAL N/A 06/02/2022   Procedure: Transcatheter Aortic Valve Replacement, Transfemoral;  Surgeon: Burnell Blanks, MD;  Location: [place];  Service: Open Heart Surgery;  Laterality: N/A;    Family History  Problem Relation Age of Onset   Diabetes Mother    Heart disease Mother        Heart murmur   Heart disease Father    Heart attack Father    Diabetes Father    Hypertension Father    Diabetes Sister     Social History:  reports that he quit smoking about 34 years ago. His smoking use included cigarettes. He has a 7.50 pack-year smoking history. He has never used smokeless tobacco. He reports current alcohol use of about 3.0 standard drinks of alcohol per week. He reports that he does not use drugs.    Review of Systems         Lipids: He was previously on Vytorin and Lipitor. He thinks he had myalgias and weakness when taking these drugs.  He has been recommended statin drugs in the lipid clinic but he refuses to take them stating that they will not help him.   Also has significantly increased LDL particle number  He was prescribed Praluent at the cardiology clinic but he refused this Has hypercholesterolemia and history of coronary artery bypass surgery Has not followed up with cardiology clinic  He was also suggested Leqvio previously and he refused   Lab Results  Component Value  Date   CHOL 228 (H) 07/07/2021   HDL 45.40 07/07/2021   LDLCALC 147 (H) 07/07/2021   TRIG 177.0 (H) 07/07/2021   CHOLHDL 5 07/07/2021    He has been  treated by cardiologist for his atrial fibrillation  Hypertension:  BP Readings from Last 3 Encounters:  06/24/22 (!) 144/76  06/15/22 132/80  06/08/22 (!) 140/70     Physical Examination:  BP (!) 144/76 (BP Location: Left Arm, Patient Position: Sitting, Cuff Size: Normal)   Pulse 72   Ht '5\' 6"'$  (1.676 m)   Wt 194 lb 3.2 oz (88.1 kg)   SpO2 99%   BMI 31.34 kg/m     ASSESSMENT:  Diabetes type 2, insulin-dependent and uncontrolled   See history of present illness for detailed discussion of current diabetes management, blood sugar patterns and problems identified  His A1c is 8.1 % and improved  He is on a basal bolus regimen with NPH at bedtime and mealtime insulin 2-3 times a day  Discussed in detail the data from his Dexcom CGM  Blood sugars are highly variable and has inadequate basal and bolus insulin currently with tendency to highly variable readings including normal readings overnight and afternoon based on his NPH insulin He also is inappropriately raising the dose of NPH based on meal size and not fasting hyperglycemia  PLAN:   He will need to switch to [place] to get a more stable basal insulin with 24 control compared to NPH Sample of [place] given Discussed actions of [place] and timing of taking this as well as 24-hour duration of this insulin for providing basal supply He will start with 20 units, flowsheet given to adjust the dose by about 2 units every 3 days with blood sugar target about 90-130 We will not adjust the based on meal size Likely needs 18 units to cover breakfast and 15 to 25 units at dinnertime based on his meal size and carbohydrate Needs more regular follow-up  Follow-up in 2 months   Patient Instructions  Take 18 units for Breakfast  Make sure you take NovoLog at least 5 to 10  minutes before eating  Adjust only the NovoLog dose to as much as 22 units at dinnertime for higher carbohydrate or higher fat meals  TRESIBA insulin: This insulin provides blood sugar control for up to 24 hours.   Start with 20 units at bedtime daily and increase by 2 units every 3 days until the waking up sugars are under 130. Then continue the same dose.  If blood sugar is under 90 for 2 days in a row, reduce the dose by 2 units.   Note that this insulin does not control the rise of blood sugar with meals and only provide steady insulin levels when you are not eating    Total visit time including counseling = 30 minutes    Elayne Snare 06/24/2022, 3:20 PM   Note: This office note was prepared with Dragon voice recognition system technology. Any transcriptional errors that result from this process are unintentional.

## 2022-06-24 NOTE — Patient Instructions (Signed)
Take 18 units for Breakfast  Make sure you take NovoLog at least 5 to 10 minutes before eating  Adjust only the NovoLog dose to as much as 22 units at dinnertime for higher carbohydrate or higher fat meals  TRESIBA insulin: This insulin provides blood sugar control for up to 24 hours.   Start with 20 units at bedtime daily and increase by 2 units every 3 days until the waking up sugars are under 130. Then continue the same dose.  If blood sugar is under 90 for 2 days in a row, reduce the dose by 2 units.   Note that this insulin does not control the rise of blood sugar with meals and only provide steady insulin levels when you are not eating

## 2022-06-26 ENCOUNTER — Encounter: Payer: Self-pay | Admitting: Endocrinology

## 2022-06-29 ENCOUNTER — Telehealth: Payer: Self-pay

## 2022-06-29 NOTE — Telephone Encounter (Signed)
Spoke to pt, informed him that he has gone back into atrial flutter. Advised would not cancel DCCV due to being out of rhythm again. Pt voiced that he thought he could tell that he went out of rhythm last night. Pt agreeable to plan.

## 2022-06-29 NOTE — Telephone Encounter (Signed)
Remote transmission received today as requested.  Presenting rhythm:

## 2022-07-01 NOTE — Anesthesia Preprocedure Evaluation (Signed)
Anesthesia Evaluation    Reviewed: Allergy & Precautions, H&P , Patient's Chart, lab work & pertinent test results  Airway Mallampati: II  TM Distance: >3 FB Neck ROM: Full    Dental no notable dental hx.    Pulmonary pneumonia, former smoker   Pulmonary exam normal breath sounds clear to auscultation       Cardiovascular + CAD, + Past MI, + CABG and +CHF  + dysrhythmias Atrial Fibrillation + Valvular Problems/Murmurs AS  Rhythm:Regular Rate:Normal + Systolic murmurs ECHO [id number] 1. Left ventricular ejection fraction, by estimation, is 50 to 55%. The  left ventricle has low normal function. The left ventricle has no regional  wall motion abnormalities. There is mild concentric left ventricular  hypertrophy. Left ventricular  diastolic function could not be evaluated.   2. Right ventricular systolic function is normal. The right ventricular  size is normal. Tricuspid regurgitation signal is inadequate for assessing  PA pressure.   3. Left atrial size was severely dilated.   4. Right atrial size was mildly dilated.   5. The mitral valve is degenerative. Trivial mitral valve regurgitation.  Mild mitral stenosis. The mean mitral valve gradient is 7.0 mmHg with  average heart rate of 99 bpm. Severe mitral annular calcification.   6. The aortic valve has been repaired/replaced. Aortic valve  regurgitation is not visualized. There is a 29 mm Sapien prosthetic (TAVR)  valve present in the aortic position. Procedure Date: 06/02/2022. Aortic  valve mean gradient measures 9.0 mmHg. Aortic  valve Vmax measures 1.92 m/s. Aortic valve acceleration time measures 74  msec.     Neuro/Psych negative neurological ROS  negative psych ROS   GI/Hepatic negative GI ROS, Neg liver ROS,,,  Endo/Other  diabetes, Poorly Controlled, Type 2    Renal/GU negative Renal ROS  negative genitourinary   Musculoskeletal negative musculoskeletal ROS (+)     Abdominal   Peds negative pediatric ROS (+)  Hematology  (+) Blood dyscrasia, anemia   Anesthesia Other Findings   Reproductive/Obstetrics negative OB ROS                             Anesthesia Physical Anesthesia Plan  ASA: 3  Anesthesia Plan: General   Post-op Pain Management: Minimal or no pain anticipated   Induction: Intravenous  PONV Risk Score and Plan: 1 and Propofol infusion and Treatment may vary due to age or medical condition  Airway Management Planned: Simple Face Mask, Natural Airway and Mask  Additional Equipment: None  Intra-op Plan:   Post-operative Plan:   Informed Consent: I have reviewed the patients History and Physical, chart, labs and discussed the procedure including the risks, benefits and alternatives for the proposed anesthesia with the patient or authorized representative who has indicated his/her understanding and acceptance.     Dental advisory given  Plan Discussed with: CRNA and Anesthesiologist  Anesthesia Plan Comments:        Anesthesia Quick Evaluation

## 2022-07-02 ENCOUNTER — Ambulatory Visit (HOSPITAL_COMMUNITY): Payer: Medicare HMO | Admitting: Certified Registered Nurse Anesthetist

## 2022-07-02 ENCOUNTER — Encounter (HOSPITAL_COMMUNITY): Payer: Self-pay | Admitting: Cardiology

## 2022-07-02 ENCOUNTER — Other Ambulatory Visit: Payer: Self-pay

## 2022-07-02 ENCOUNTER — Ambulatory Visit (HOSPITAL_COMMUNITY)
Admission: RE | Admit: 2022-07-02 | Discharge: 2022-07-02 | Disposition: A | Discharge status: Home / Self Care | Payer: Medicare HMO | Attending: Cardiology | Admitting: Cardiology

## 2022-07-02 ENCOUNTER — Encounter (HOSPITAL_COMMUNITY)
Admission: RE | Disposition: A | Discharge status: Home / Self Care | Payer: Self-pay | Source: Home / Self Care | Attending: Cardiology

## 2022-07-02 ENCOUNTER — Ambulatory Visit (HOSPITAL_BASED_OUTPATIENT_CLINIC_OR_DEPARTMENT_OTHER): Payer: Medicare HMO | Admitting: Certified Registered Nurse Anesthetist

## 2022-07-02 DIAGNOSIS — I483 Typical atrial flutter: Secondary | ICD-10-CM

## 2022-07-02 DIAGNOSIS — Z87891 Personal history of nicotine dependence: Secondary | ICD-10-CM

## 2022-07-02 DIAGNOSIS — I4892 Unspecified atrial flutter: Secondary | ICD-10-CM | POA: Insufficient documentation

## 2022-07-02 DIAGNOSIS — I509 Heart failure, unspecified: Secondary | ICD-10-CM | POA: Diagnosis not present

## 2022-07-02 DIAGNOSIS — I4891 Unspecified atrial fibrillation: Secondary | ICD-10-CM

## 2022-07-02 DIAGNOSIS — E1165 Type 2 diabetes mellitus with hyperglycemia: Secondary | ICD-10-CM | POA: Diagnosis not present

## 2022-07-02 DIAGNOSIS — I2510 Atherosclerotic heart disease of native coronary artery without angina pectoris: Secondary | ICD-10-CM | POA: Diagnosis not present

## 2022-07-02 DIAGNOSIS — I252 Old myocardial infarction: Secondary | ICD-10-CM

## 2022-07-02 DIAGNOSIS — Z951 Presence of aortocoronary bypass graft: Secondary | ICD-10-CM | POA: Diagnosis not present

## 2022-07-02 HISTORY — PX: CARDIOVERSION: SHX1299

## 2022-07-02 LAB — POCT I-STAT, CHEM 8
BUN: 21 mg/dL (ref 8–23)
Calcium, Ion: 1.16 mmol/L (ref 1.15–1.40)
Chloride: 102 mmol/L (ref 98–111)
Creatinine, Ser: 1.2 mg/dL (ref 0.61–1.24)
Glucose, Bld: 317 mg/dL — ABNORMAL HIGH (ref 70–99)
HCT: 31 % — ABNORMAL LOW (ref 39.0–52.0)
Hemoglobin: 10.5 g/dL — ABNORMAL LOW (ref 13.0–17.0)
Potassium: 4.6 mmol/L (ref 3.5–5.1)
Sodium: 136 mmol/L (ref 135–145)
TCO2: 23 mmol/L (ref 22–32)

## 2022-07-02 SURGERY — CARDIOVERSION
Anesthesia: General

## 2022-07-02 MED ORDER — SODIUM CHLORIDE 0.9 % IV SOLN: INTRAVENOUS | Status: DC

## 2022-07-02 MED ORDER — LIDOCAINE 2% (20 MG/ML) 5 ML SYRINGE
INTRAMUSCULAR | Status: DC | PRN
  Administered 2022-07-02: 100 mg via INTRAVENOUS

## 2022-07-02 MED ORDER — PROPOFOL 10 MG/ML IV BOLUS
INTRAVENOUS | Status: DC | PRN
  Administered 2022-07-02: 70 mg via INTRAVENOUS

## 2022-07-02 MED ORDER — SODIUM CHLORIDE 0.9 % IR SOLN: 500.0000 mL | Freq: Once | Status: DC

## 2022-07-02 NOTE — CV Procedure (Signed)
Procedure:   DCCV  Indication:  Symptomatic atrial flutter  Procedure Note:  The patient signed informed consent.  They have had had therapeutic anticoagulation with apixaban greater than 3 weeks.  Anesthesia was administered by Dr. Ambrose Pancoast.  Patient received 100 mg IV lidocaine and 70 mg IV propofol.Adequate airway was maintained throughout and vital followed per protocol.  They were cardioverted x 1 with 150J of biphasic synchronized energy.  They converted to NSR.  There were no apparent complications.  The patient had normal neuro status and respiratory status post procedure with vitals stable as recorded elsewhere.    Rhythm pre and post cardioversion confirmed on programmer.  Follow up:  They will continue on current medical therapy and follow up with cardiology as scheduled.  Buford Dresser, MD PhD 07/02/2022 8:34 AM

## 2022-07-02 NOTE — Transfer of Care (Signed)
Immediate Anesthesia Transfer of Care Note  Patient: Matthew [name]  Procedure(s) Performed: CARDIOVERSION  Patient Location: Endoscopy Unit  Anesthesia Type:General  Level of Consciousness: drowsy and patient cooperative  Airway & Oxygen Therapy: Patient Spontanous Breathing and Patient connected to nasal cannula oxygen  Post-op Assessment: Report given to RN, Post -op Vital signs reviewed and stable, and Patient moving all extremities X 4  Post vital signs: Reviewed and stable  Last Vitals:  Vitals Value Taken Time  BP 131/66   Temp    Pulse 80   Resp 22   SpO2 91     Last Pain:  Vitals:   07/02/22 0735  TempSrc: Temporal  PainSc: 0-No pain         Complications: No notable events documented.

## 2022-07-02 NOTE — Progress Notes (Signed)
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 He is followed in our office by Dr. Lennie Odor. He has undergone atrial flutter ablation x 3. He has CAD and has undergone 3V CABG in 2011. He had dizziness and fatigue leading to an echo on 11/10/21 with LVEF=55-60% and severe aortic stenosis. Cardiac cath 04/13/22 with severe three vessel CAD with 3/3 patent bypass grafts. He underwent successful TAVR on 06/02/22 with placement of a 29 mm Edwards Sapien 3 THV from the transfemoral approach. Echo on post op day one with normally functioning AVR. He had symptomatic AV block during the first 24 hours following TAVR and had a permanent pacemaker placed. He felt poorly following discharge and was in atrial fibrillation with RVR when seen in our office on 06/08/22. His ASA was stopped and he was started on Eliquis. Plans to anti-coagulate for one month and then plan DCCV if he remained in atrial fibrillation. Toprol was increased to 100 mg daily. He was cardioverted to sinus on 07/02/22. Echo today with LVEF around 45%, mild to moderate mitral stenosis, mild MR and normally functioning AVR with no PVL. Mean gradient 5 mmHg.   He is here today for follow up. The patient denies any chest pain, dyspnea, palpitations, lower extremity edema, orthopnea, PND, dizziness, near syncope or syncope. He feels much better today in sinus rhythm.   Primary Care Physician: Wendie Agreste, MD Primary Cardiologist: [place] Referring Cardiologist: Lennie Odor  Past Medical History:  Diagnosis Date   Anemia    Arthritis    "back, left ankle" (11/20/2015)   Cataract    CHF  (congestive heart failure) (HCC)    Chronic lower back pain    Coronary artery disease    a. 05/2009 CABG x 3: LIMA->LAD, VG->OM, VG->PDA; b. Nuc 01/2014: inf-lat scar but no ischemia, EF 57%.   Diabetic retinopathy (HCC)    mild- Dr. Herbert Deaner   Glaucoma    Hyperlipidemia    a. Intolerant of lipitor and vytorin.   Lyme disease    Migraine    "once or twice" (11/20/2015)   Myocardial infarction [facility])    'saw evidence of it on an EKG done in 2005"   Paroxysmal atrial flutter ([place])    a. 01/2014 s/p DCCV;  b. CHA2DS2VASc = 3-->Eliquis.   Pneumonia 1960   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   46m S3UR via TF approach with Dr. MAngelena Formand Dr. BCyndia Bent  Sacral pain    "right"   Sciatica of right side    Testicular cancer ([facility] 1982   "heavy doses of radiation; it was stage 2"   Type II diabetes mellitus ([facility]    Valvular heart disease    a. 01/2014 Echo: Ef 60-65%, no rwma, mild AI/MS, mod MR, mildly dil LA.    Past Surgical History:  Procedure Laterality Date   ABDOMINAL EXPLORATION SURGERY  ~ 2005   "for hernia, but didn't have one"   ATRIAL FIBRILLATION ABLATION N/A 09/17/2016   Procedure: Atrial Fibrillation Ablation;  Surgeon: CConstance Haw MD;  [facility];  Service: Cardiovascular;  Laterality: N/A;  CARDIAC CATHETERIZATION  2011   CARDIOVERSION N/A 02/19/2014   Procedure: CARDIOVERSION;  Surgeon: [name], MD;  Location: [facility] ENDOSCOPY;  Service: Cardiovascular;  Laterality: N/A;   CARDIOVERSION N/A 10/28/2015   Procedure: CARDIOVERSION;  Surgeon: [name], MD;  Location: [place];  Service: Cardiovascular;  Laterality: N/A;   CARDIOVERSION  11/20/2015   "200 joules"   CORONARY ARTERY BYPASS GRAFT  05/2009   LIMA-LAD, SVG-OM, SVG-PDA 06/20/09   ELBOW FRACTURE SURGERY Left    broken ulna on left elbow-surgical repair   ELECTROPHYSIOLOGIC STUDY N/A 10/31/2014   Procedure: A-Flutter;  Surgeon: [name],  MD;  Location: [place] CV LAB;  Service: Cardiovascular;  Laterality: N/A;   ELECTROPHYSIOLOGIC STUDY N/A 11/20/2015   Procedure: A-Flutter Ablation;  Surgeon: [name], MD;  Location: [place] CV LAB;  Service: Cardiovascular;  Laterality: N/A;   EYE SURGERY     FRACTURE SURGERY     INTRAOPERATIVE TRANSTHORACIC ECHOCARDIOGRAM N/A 06/02/2022   Procedure: INTRAOPERATIVE TRANSTHORACIC ECHOCARDIOGRAM;  Surgeon: Burnell Blanks, MD;  Location: [place];  Service: Open Heart Surgery;  Laterality: N/A;   PACEMAKER IMPLANT N/A 06/03/2022   Procedure: PACEMAKER IMPLANT;  Surgeon: [name], MD;  Location: [place] CV LAB;  Service: Cardiovascular;  Laterality: N/A;   RIGHT/LEFT HEART CATH AND CORONARY/GRAFT ANGIOGRAPHY N/A 04/13/2022   Procedure: RIGHT/LEFT HEART CATH AND CORONARY/GRAFT ANGIOGRAPHY;  Surgeon: Burnell Blanks, MD;  Location: [place] CV LAB;  Service: Cardiovascular;  Laterality: N/A;   TEE WITHOUT CARDIOVERSION N/A 03/13/2022   Procedure: TRANSESOPHAGEAL ECHOCARDIOGRAM (TEE);  Surgeon: Sanda Klein, MD;  Location: [place];  Service: Cardiovascular;  Laterality: N/A;   TESTICLE REMOVAL Right 1982   TONSILLECTOMY  ~ [place], TRANSFEMORAL N/A 06/02/2022   Procedure: Transcatheter Aortic Valve Replacement, Transfemoral;  Surgeon: Burnell Blanks, MD;  Location: [place];  Service: Open Heart Surgery;  Laterality: N/A;    Current Outpatient Medications  Medication Sig Dispense Refill   apixaban (ELIQUIS) 5 MG TABS tablet Take 1 tablet (5 mg total) by mouth 2 (two) times daily. 60 tablet 11   Continuous Blood Gluc Sensor (DEXCOM G7 SENSOR) MISC by Does not apply route.     dorzolamide-timolol (COSOPT) 22.3-6.8 MG/ML ophthalmic solution Place 1 drop into the left eye 2 (two) times daily.     furosemide (LASIX) 40 MG tablet Take 40 mg by mouth as needed for fluid or edema (Once every two weeks).      insulin aspart (NOVOLOG) 100 UNIT/ML injection Inject 18-20 Units into the skin 3 (three) times daily with meals. (Patient taking differently: Inject 15-20 Units into the skin 3 (three) times daily with meals. Sliding scale) 30 mL 1   Insulin Pen Needle (PEN NEEDLES) 32G X 4 MM MISC Use as instructed to inject insulin 100 each 3   Insulin Syringe-Needle U-100 (BD VEO INSULIN SYRINGE U/F) 31G X 15/64" 0.5 ML MISC Use to inject 4 times a day 400 each 3   latanoprost (XALATAN) 0.005 % ophthalmic solution Place 1 drop into the left eye at bedtime.     lisinopril (ZESTRIL) 5 MG tablet TAKE 1 TABLET BY MOUTH EVERY DAY 90 tablet 2   metoprolol succinate (TOPROL-XL) 50 MG 24 hr tablet Take 50 mg by mouth 2 (two) times daily. Take with or immediately following a meal.     NOVOLIN N 100 UNIT/ML injection INJECT 22 UNITS IN THE SKIN DAILY AT BEDTIME. (Patient  taking differently: Inject 20-24 Units into the skin at bedtime.) 10 mL 2   potassium chloride (KLOR-CON) 10 MEQ tablet Take 10 mEq by mouth as needed (with lasix). Once every two weeks     No current facility-administered medications for this visit.    Allergies  Allergen Reactions   Actos [Pioglitazone] Swelling   Bee Venom Swelling   Codeine Hives and Nausea Only   Metformin Nausea Only   Statins Other (See Comments)    Muscle pain, fatigue   Raspberry Itching    Social History   Socioeconomic History   Marital status: Married    Spouse name: Not on file   Number of children: 0   Years of education: Not on file   Highest education level: Not on file  Occupational History   Occupation: Photo journalist  Tobacco Use   Smoking status: Former    Packs/day: 0.50    Years: 15.00    Total pack years: 7.50    Types: Cigarettes    Quit date: 1990    Years since quitting: 34.2   Smokeless tobacco: Never   Tobacco comments:    "quit smoking cigarettes in the 1980's; don't know how much or for how long"  Vaping Use   Vaping Use: Never  used  Substance and Sexual Activity   Alcohol use: Yes    Alcohol/week: 3.0 standard drinks of alcohol    Types: 3 Cans of beer per week   Drug use: No    Comment: "quit smoking pot in the 1990s"   Sexual activity: Not Currently    Birth control/protection: Coitus interruptus  Other Topics Concern   Not on file  Social History Narrative   Not on file   Social Determinants of Health   Financial Resource Strain: Not on file  Food Insecurity: No Food Insecurity (06/05/2022)   Hunger Vital Sign    Worried About Running Out of Food in the Last Year: Never true    Ran Out of Food in the Last Year: Never true  Transportation Needs: No Transportation Needs (06/05/2022)   PRAPARE - Hydrologist (Medical): No    Lack of Transportation (Non-Medical): No  Physical Activity: Not on file  Stress: Not on file  Social Connections: Not on file  Intimate Partner Violence: Not At Risk (03/28/2022)   Humiliation, Afraid, Rape, and Kick questionnaire    Fear of Current or Ex-Partner: No    Emotionally Abused: No    Physically Abused: No    Sexually Abused: No    Family History  Problem Relation Age of Onset   Diabetes Mother    Heart disease Mother        Heart murmur   Heart disease Father    Heart attack Father    Diabetes Father    Hypertension Father    Diabetes Sister     Review of Systems:  As stated in the HPI and otherwise negative.   BP 132/68   Pulse 75   Ht '5\' 6"'$  (1.676 m)   Wt 89.3 kg   SpO2 96%   BMI 31.76 kg/m   Physical Examination:. General: Well developed, well nourished, NAD  HEENT: OP clear, mucus membranes moist  SKIN: warm, dry. No rashes. Neuro: No focal deficits  Musculoskeletal: Muscle strength 5/5 all ext  Psychiatric: Mood and affect normal  Neck: No JVD, no carotid bruits, no thyromegaly, no lymphadenopathy.  Lungs:Clear bilaterally, no wheezes, rhonci, crackles Cardiovascular: Regular rate  and rhythm. Soft systolic  murmur.  Abdomen:Soft. Bowel sounds present. Non-tender.  Extremities: No lower extremity edema. Pulses are 2 + in the bilateral DP/PT.  EKG:  EKG is ordered today. The EKG shows Sinus, 1st degree AV block. RBBB. Inferior Q waves  Echo 07/03/22: 1. Left ventricular ejection fraction, by estimation, is 40 to 45%. The  left ventricle has mildly decreased function. The left ventricle  demonstrates regional wall motion abnormalities (see scoring  diagram/findings for description).  Inferior/inferolateral hypokinesis. Left ventricular diastolic parameters  are consistent with Grade III diastolic dysfunction (restrictive).  Elevated left atrial pressure.   2. Right ventricular systolic function was not well visualized. The right  ventricular size is not well visualized.   3. Right atrial size was mildly dilated.   4. The mitral valve is degenerative. Mild mitral valve regurgitation.  Mild to moderate mitral stenosis. The mean mitral valve gradient is 5.0  mmHg with average heart rate of 70 bpm. MVA 1.1 cm^2 by continuity  equation. Severe mitral annular  calcification.   5. The aortic valve has been repaired/replaced. Aortic valve  regurgitation is not visualized. There is a 29 mm Edwards Sapien  prosthetic (TAVR) valve present in the aortic position. Echo findings are  consistent with normal structure and function of the  aortic valve prosthesis. Aortic valve mean gradient measures 5.0 mmHg.   6. The inferior vena cava is normal in size with greater than 50%  respiratory variability, suggesting right atrial pressure of 3 mmHg.   Recent Labs: 10/23/2021: Pro B Natriuretic peptide (BNP) 223.0; TSH 2.24 05/29/2022: ALT 15 06/03/2022: Magnesium 1.9 06/08/2022: Platelets 229 07/02/2022: BUN 21; Creatinine, Ser 1.20; Hemoglobin 10.5; Potassium 4.6; Sodium 136    Wt Readings from Last 3 Encounters:  07/03/22 89.3 kg  07/02/22 88.1 kg  06/24/22 88.1 kg    Assessment and Plan:   1. Severe Aortic  Valve Stenosis: He is now one month post TAVR with placement of a 29 mm Edwards Sapien 3 valve from the transfemoral approach. Echo today shows normally functioning AVR with no PVL. SBE prophylaxis when indicated. He is on Eliquis. NYHA class 2.   2. Symptomatic high grade AV block: Permanent pacemaker in place and followed by Dr. Lennie Odor. Follow up in May 2024.   3. Paroxysmal atrial fibrillation/flutter: Sinus today post cardioversoin 07/02/22. Will continue Toprol and Eliquis.   4. Chronic diastolic CHF: Volume status normal today. Weight is stable. Continue Lasix as needed. He has been using once every two weeks.   5. CAD s/p 3V CABG without angina: No chest pain. He is not on ASA since he is now on Eliquis. Continue beta blocker. He does not tolerate statins. We will consider referral to lipid clinic to discuss Repatha.   6. Small pulmonary nodule: Repeat chest CT in one year to follow.   7.  Elevated RAISE score: Cardiac Amyloid labs drawn while inpatient. Kappa light chain elevated at 38.2, kappa light chain ratio elevated at 1.93, immunofixation found to be unremarkable with no evidence of monoclonal protein. Will review with team.   Labs/ tests ordered today include:   Orders Placed This Encounter  Procedures   EKG 12-Lead   Disposition:   F/U with me in one year.   Signed, Lauree Chandler, MD, [facility] 07/03/2022 2:21 PM    [address], [place], [place]  [address] Phone: [phone]; Fax: [phone]

## 2022-07-02 NOTE — Anesthesia Postprocedure Evaluation (Signed)
Anesthesia Post Note  Patient: Matthew [name]  Procedure(s) Performed: CARDIOVERSION     Patient location during evaluation: PACU Anesthesia Type: General Level of consciousness: awake and alert Pain management: pain level controlled Vital Signs Assessment: post-procedure vital signs reviewed and stable Respiratory status: spontaneous breathing, nonlabored ventilation, respiratory function stable and patient connected to nasal cannula oxygen Cardiovascular status: blood pressure returned to baseline and stable Postop Assessment: no apparent nausea or vomiting Anesthetic complications: no   No notable events documented.  Last Vitals:  Vitals:   07/02/22 0735  BP: (!) 155/69  Pulse: 97  Resp: (!) 25  Temp: 36.8 C  SpO2: 96%    Last Pain:  Vitals:   07/02/22 0735  TempSrc: Temporal  PainSc: 0-No pain                 Earnstine Meinders

## 2022-07-02 NOTE — Interval H&P Note (Signed)
History and Physical Interval Note:  07/02/2022 8:15 AM  Matthew [name]  has presented today for surgery, with the diagnosis of AFIB.  The various methods of treatment have been discussed with the patient and family. After consideration of risks, benefits and other options for treatment, the patient has consented to  Procedure(s): CARDIOVERSION (N/A) as a surgical intervention.  The patient's history has been reviewed, patient examined, no change in status, stable for surgery.  I have reviewed the patient's chart and labs.  Questions were answered to the patient's satisfaction.     Catia Todorov Harrell Gave

## 2022-07-03 ENCOUNTER — Ambulatory Visit: Payer: Medicare HMO | Admitting: Cardiovascular Disease

## 2022-07-03 ENCOUNTER — Ambulatory Visit: Payer: Medicare HMO

## 2022-07-03 ENCOUNTER — Encounter: Payer: Self-pay | Admitting: Cardiovascular Disease

## 2022-07-03 ENCOUNTER — Ambulatory Visit (HOSPITAL_COMMUNITY): Payer: Medicare HMO | Attending: Cardiology

## 2022-07-03 VITALS — BP 132/68 | HR 75 | Ht 66.0 in | Wt 196.8 lb

## 2022-07-03 DIAGNOSIS — Z952 Presence of prosthetic heart valve: Secondary | ICD-10-CM

## 2022-07-03 DIAGNOSIS — I350 Nonrheumatic aortic (valve) stenosis: Secondary | ICD-10-CM | POA: Diagnosis not present

## 2022-07-03 DIAGNOSIS — I442 Atrioventricular block, complete: Secondary | ICD-10-CM | POA: Insufficient documentation

## 2022-07-03 DIAGNOSIS — I480 Paroxysmal atrial fibrillation: Secondary | ICD-10-CM

## 2022-07-03 DIAGNOSIS — E7800 Pure hypercholesterolemia, unspecified: Secondary | ICD-10-CM

## 2022-07-03 DIAGNOSIS — I2510 Atherosclerotic heart disease of native coronary artery without angina pectoris: Secondary | ICD-10-CM | POA: Diagnosis not present

## 2022-07-03 LAB — ECHOCARDIOGRAM COMPLETE
AV Mean grad: 5 mmHg
AV Peak grad: 10.2 mmHg
Ao pk vel: 1.6 m/s
Area-P 1/2: 3.48 cm2
MV M vel: 4.43 m/s
MV Peak grad: 78.5 mmHg
S' Lateral: 3.6 cm

## 2022-07-03 MED ORDER — PERFLUTREN LIPID MICROSPHERE
1.0000 mL | INTRAVENOUS | Status: AC | PRN
  Administered 2022-07-03: 2 mL via INTRAVENOUS

## 2022-07-06 ENCOUNTER — Encounter (HOSPITAL_COMMUNITY): Payer: Self-pay | Admitting: Cardiology

## 2022-07-07 ENCOUNTER — Other Ambulatory Visit: Payer: Self-pay | Admitting: Cardiology

## 2022-07-07 DIAGNOSIS — E854 Organ-limited amyloidosis: Secondary | ICD-10-CM

## 2022-07-07 DIAGNOSIS — Z952 Presence of prosthetic heart valve: Secondary | ICD-10-CM

## 2022-07-09 ENCOUNTER — Telehealth (HOSPITAL_COMMUNITY): Payer: Self-pay | Admitting: *Deleted

## 2022-07-09 NOTE — Telephone Encounter (Signed)
Reminder call given for upcoming appointment on 07/15/22 at 12:30.

## 2022-07-13 ENCOUNTER — Inpatient Hospital Stay: Payer: Medicare HMO | Attending: Hematology and Oncology

## 2022-07-13 ENCOUNTER — Inpatient Hospital Stay: Payer: Medicare HMO | Admitting: Hematology and Oncology

## 2022-07-13 ENCOUNTER — Other Ambulatory Visit: Payer: Self-pay

## 2022-07-13 VITALS — BP 142/67 | HR 77 | Temp 97.7°F | Resp 17 | Ht 66.0 in | Wt 195.6 lb

## 2022-07-13 DIAGNOSIS — D649 Anemia, unspecified: Secondary | ICD-10-CM | POA: Diagnosis not present

## 2022-07-13 DIAGNOSIS — Z87891 Personal history of nicotine dependence: Secondary | ICD-10-CM

## 2022-07-13 DIAGNOSIS — Z8547 Personal history of malignant neoplasm of testis: Secondary | ICD-10-CM | POA: Insufficient documentation

## 2022-07-13 DIAGNOSIS — E11319 Type 2 diabetes mellitus with unspecified diabetic retinopathy without macular edema: Secondary | ICD-10-CM | POA: Insufficient documentation

## 2022-07-13 DIAGNOSIS — E785 Hyperlipidemia, unspecified: Secondary | ICD-10-CM

## 2022-07-13 DIAGNOSIS — R768 Other specified abnormal immunological findings in serum: Secondary | ICD-10-CM | POA: Diagnosis not present

## 2022-07-13 LAB — CMP (CANCER CENTER ONLY)
ALT: 12 U/L (ref 0–44)
AST: 16 U/L (ref 15–41)
Albumin: 3.8 g/dL (ref 3.5–5.0)
Alkaline Phosphatase: 95 U/L (ref 38–126)
Anion gap: 5 (ref 5–15)
BUN: 17 mg/dL (ref 8–23)
CO2: 30 mmol/L (ref 22–32)
Calcium: 9.1 mg/dL (ref 8.9–10.3)
Chloride: 102 mmol/L (ref 98–111)
Creatinine: 1.15 mg/dL (ref 0.61–1.24)
GFR, Estimated: >60 mL/min (ref >60)
Glucose, Bld: 147 mg/dL — ABNORMAL HIGH (ref 70–99)
Potassium: 4.1 mmol/L (ref 3.5–5.1)
Sodium: 137 mmol/L (ref 135–145)
Total Bilirubin: 0.5 mg/dL (ref 0.3–1.2)
Total Protein: 7.3 g/dL (ref 6.5–8.1)

## 2022-07-13 LAB — RETIC PANEL
Immature Retic Fract: 20 % — ABNORMAL HIGH (ref 2.3–15.9)
RBC.: 3.93 MIL/uL — ABNORMAL LOW (ref 4.22–5.81)
Retic Count, Absolute: 63.3 10*3/uL (ref 19.0–186.0)
Retic Ct Pct: 1.6 % (ref 0.4–3.1)
Reticulocyte Hemoglobin: 29.7 pg (ref >27.9)

## 2022-07-13 LAB — CBC WITH DIFFERENTIAL (CANCER CENTER ONLY)
Abs Immature Granulocytes: 0.02 10*3/uL (ref 0.00–0.07)
Basophils Absolute: 0.1 10*3/uL (ref 0.0–0.1)
Basophils Relative: 1 %
Eosinophils Absolute: 0.7 10*3/uL — ABNORMAL HIGH (ref 0.0–0.5)
Eosinophils Relative: 9 %
HCT: 33.9 % — ABNORMAL LOW (ref 39.0–52.0)
Hemoglobin: 11.3 g/dL — ABNORMAL LOW (ref 13.0–17.0)
Immature Granulocytes: 0 %
Lymphocytes Relative: 20 %
Lymphs Abs: 1.5 10*3/uL (ref 0.7–4.0)
MCH: 29 pg (ref 26.0–34.0)
MCHC: 33.3 g/dL (ref 30.0–36.0)
MCV: 86.9 fL (ref 80.0–100.0)
Monocytes Absolute: 0.6 10*3/uL (ref 0.1–1.0)
Monocytes Relative: 7 %
Neutro Abs: 4.8 10*3/uL (ref 1.7–7.7)
Neutrophils Relative %: 63 %
Platelet Count: 268 10*3/uL (ref 150–400)
RBC: 3.9 MIL/uL — ABNORMAL LOW (ref 4.22–5.81)
RDW: 13.2 % (ref 11.5–15.5)
WBC Count: 7.6 10*3/uL (ref 4.0–10.5)
nRBC: 0 % (ref 0.0–0.2)

## 2022-07-13 LAB — IRON AND IRON BINDING CAPACITY (CC-WL,HP ONLY)
Iron: 41 ug/dL — ABNORMAL LOW (ref 45–182)
Saturation Ratios: 12 % — ABNORMAL LOW (ref 17.9–39.5)
TIBC: 340 ug/dL (ref 250–450)
UIBC: 299 ug/dL (ref 117–376)

## 2022-07-13 LAB — FOLATE: Folate: 10.2 ng/mL (ref >5.9)

## 2022-07-13 LAB — VITAMIN B12: Vitamin B-12: 200 pg/mL (ref 180–914)

## 2022-07-13 LAB — LACTATE DEHYDROGENASE: LDH: 266 U/L — ABNORMAL HIGH (ref 98–192)

## 2022-07-13 LAB — FERRITIN: Ferritin: 63 ng/mL (ref 24–336)

## 2022-07-13 NOTE — Progress Notes (Signed)
[place] [phone]   Fax:([phone]) [place] NOTE  Patient Care Team: Wendie Agreste, MD as PCP - General (Family Medicine) [name], MD as PCP - Electrophysiology (Cardiology) [name], MD as PCP - Cardiology (Cardiology) [name], MD as Consulting Physician (Cardiology)  Hematological/Oncological History # Normocytic Anemia # Elevated Serum Free Light Chain Ratio  06/02/2022: SPEP showed no M protein, Kappa 38.2, Lambda 19.8, K/L ratio 1.93 07/13/2022: establish care with Dr. Lorenso Courier   CHIEF COMPLAINTS/PURPOSE OF CONSULTATION:  "Normocytic Anemia "  HISTORY OF PRESENTING ILLNESS:  Matthew [name] 77 y.o. male with medical history significant for HLD, migraine, paroxysmal atrial fibrillation, DM type II, and testicular cancer who presents for evaluation of anemia and elevated serum free light chain ratio.   On review of the previous records Mr. Ohagan has a longstanding history of anemia.  Since at least February 2011 he has had hemoglobins below the normal reference range.  This typically been a normocytic anemia.  He had a few values in that time with a hemoglobin greater than 13 but the majority have been in the 10-12 range.  On last review on 07/02/2022 patient had a hemoglobin of 10.5.  Due to concern for these findings he was referred to hematology for further evaluation and management.  Additionally there is some concern for amyloidosis and he is being evaluated for this.  He had an elevated serum free light chain ratio but no evidence of a monoclonal protein in the serum.  On exam today Mr. Skog accompanied by his wife.  He reports he has been anemic for "a while".  He notes he always has low hemoglobin and low red blood cells" misshapened cells".  He reports that he did have a history of testicular cancer approximately 40 years ago and received a lot of radiation from his pelvis to his  collarbones.  He notes that he was told to be "careful with the x-rays".  This occurred in [place].  He reports that about 6 weeks ago he did undergo a TAVR procedure and they were concerned that his valve was "damaged by radiation".  He notes he does not have any overt signs of bleeding.  He is undergone colonoscopy and EGD in the last 5 years at [facility].  He notes that he eats a regular diet including red meat approximate 3-4 times a week but avoids raspberries due to allergy and itching.  On further discussion he reports that his mother passed away from a heart valve issue and his father passed away of prostate cancer.  He notes that he does not have any children.  He is a former smoker having quit in 1990 after smoking for 25 years.  He notes that he drinks a few beers per week.  He is retired a Ambulance person.  He does endorse having lightheadedness, dizziness, shortness of breath.  His energy is about a 4 out of 10.  He notes that he used to exercise a lot but had to stop.  He also struggles with spinal stenosis.  He otherwise denies any fevers, chills, sweats, nausea, vomiting or diarrhea.  A full 10 point ROS was otherwise negative.  MEDICAL HISTORY:  Past Medical History:  Diagnosis Date   Anemia    Arthritis    "back, left ankle" (11/20/2015)   Cataract    CHF (congestive heart failure) (HCC)    Chronic lower back pain    Coronary artery disease  a. 05/2009 CABG x 3: LIMA->LAD, VG->OM, VG->PDA; b. Nuc 01/2014: inf-lat scar but no ischemia, EF 57%.   Diabetic retinopathy (HCC)    mild- Dr. Herbert Deaner   Glaucoma    Hyperlipidemia    a. Intolerant of lipitor and vytorin.   Lyme disease    Migraine    "once or twice" (11/20/2015)   Myocardial infarction [facility])    'saw evidence of it on an EKG done in 2005"   Paroxysmal atrial flutter ([place])    a. 01/2014 s/p DCCV;  b. CHA2DS2VASc = 3-->Eliquis.   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 Angelena Form and Dr. Cyndia Bent   Sacral pain    "right"   Sciatica of right side    Testicular cancer ([place]) 1982   "heavy doses of radiation; it was stage 2"   Type II diabetes mellitus ([place])    Valvular heart disease    a. 01/2014 Echo: Ef 60-65%, no rwma, mild AI/MS, mod MR, mildly dil LA.    SURGICAL HISTORY: Past Surgical History:  Procedure Laterality Date   ABDOMINAL EXPLORATION SURGERY  ~ 2005   "for hernia, but didn't have one"   ATRIAL FIBRILLATION ABLATION N/A 09/17/2016   Procedure: Atrial Fibrillation Ablation;  Surgeon: [name], MD;  Location: [place] CV LAB;  Service: Cardiovascular;  Laterality: N/A;   CARDIAC CATHETERIZATION  2011   CARDIOVERSION N/A 02/19/2014   Procedure: CARDIOVERSION;  Surgeon: [name], MD;  Location: [facility] ENDOSCOPY;  Service: Cardiovascular;  Laterality: N/A;   CARDIOVERSION N/A 10/28/2015   Procedure: CARDIOVERSION;  Surgeon: [name], MD;  Location[facility] END[facility]COPY;  Service: Cardiovascular;  Laterality: N/A;   CARDIOVERSION  11/20/2015   "200 joules"   CARDIOVERSION N/A 07/02/2022   Procedure: CARDIOVERSION;  Surgeon: Buford Dresser, MD;  Location: [place];  Service: Cardiovascular;  Laterality: N/A;   CORONARY ARTERY BYPASS GRAFT  05/2009   LIMA-LAD, SVG-OM, SVG-PDA 06/20/09   ELBOW FRACTURE SURGERY Left    broken ulna on left elbow-surgical repair   ELECTROPHYSIOLOGIC STUDY N/A 10/31/2014   Procedure: A-Flutter;  Surgeon: [name], MD;  Location: [place] CV LAB;  Service: Cardiovascular;  Laterality: N/A;   ELECTROPHYSIOLOGIC STUDY N/A 11/20/2015   Procedure: A-Flutter Ablation;  Surgeon: [name], MD;  Location: [place] CV LAB;  Service: Cardiovascular;  Laterality: N/A;   EYE SURGERY     FRACTURE SURGERY     INTRAOPERATIVE TRANSTHORACIC ECHOCARDIOGRAM N/A 06/02/2022   Procedure: INTRAOPERATIVE TRANSTHORACIC ECHOCARDIOGRAM;  Surgeon: Burnell Blanks, MD;   Location: [place];  Service: Open Heart Surgery;  Laterality: N/A;   PACEMAKER IMPLANT N/A 06/03/2022   Procedure: PACEMAKER IMPLANT;  Surgeon: [name], MD;  Location: [place] CV LAB;  Service: Cardiovascular;  Laterality: N/A;   RIGHT/LEFT HEART CATH AND CORONARY/GRAFT ANGIOGRAPHY N/A 04/13/2022   Procedure: RIGHT/LEFT HEART CATH AND CORONARY/GRAFT ANGIOGRAPHY;  Surgeon: Burnell Blanks, MD;  Location: [place] CV LAB;  Service: Cardiovascular;  Laterality: N/A;   TEE WITHOUT CARDIOVERSION N/A 03/13/2022   Procedure: TRANSESOPHAGEAL ECHOCARDIOGRAM (TEE);  Surgeon: Sanda Klein, MD;  Location: [place];  Service: Cardiovascular;  Laterality: N/A;   TESTICLE REMOVAL Right 1982   TONSILLECTOMY  ~ [place], TRANSFEMORAL N/A 06/02/2022   Procedure: Transcatheter Aortic Valve Replacement, Transfemoral;  Surgeon: Burnell Blanks, MD;  Location: [place];  Service: Open Heart  Surgery;  Laterality: N/A;    SOCIAL HISTORY: Social History   Socioeconomic History   Marital status: Married    Spouse name: Not on file   Number of children: 0   Years of education: Not on file   Highest education level: Not on file  Occupational History   Occupation: Photo journalist  Tobacco Use   Smoking status: Former    Packs/day: 0.50    Years: 15.00    Additional pack years: 0.00    Total pack years: 7.50    Types: Cigarettes    Quit date: 1990    Years since quitting: 34.2   Smokeless tobacco: Never   Tobacco comments:    "quit smoking cigarettes in the 1980's; don't know how much or for how long"  Vaping Use   Vaping Use: Never used  Substance and Sexual Activity   Alcohol use: Yes    Alcohol/week: 3.0 standard drinks of alcohol    Types: 3 Cans of beer per week   Drug use: No    Comment: "quit smoking pot in the 1990s"   Sexual activity: Not Currently    Birth control/protection: Coitus interruptus  Other Topics Concern    Not on file  Social History Narrative   Not on file   Social Determinants of Health   Financial Resource Strain: Not on file  Food Insecurity: No Food Insecurity (06/05/2022)   Hunger Vital Sign    Worried About Running Out of Food in the Last Year: Never true    Ran Out of Food in the Last Year: Never true  Transportation Needs: No Transportation Needs (06/05/2022)   PRAPARE - Hydrologist (Medical): No    Lack of Transportation (Non-Medical): No  Physical Activity: Not on file  Stress: Not on file  Social Connections: Not on file  Intimate Partner Violence: Not At Risk (03/28/2022)   Humiliation, Afraid, Rape, and Kick questionnaire    Fear of Current or Ex-Partner: No    Emotionally Abused: No    Physically Abused: No    Sexually Abused: No    FAMILY HISTORY: Family History  Problem Relation Age of Onset   Diabetes Mother    Heart disease Mother        Heart murmur   Heart disease Father    Heart attack Father    Diabetes Father    Hypertension Father    Diabetes Sister     ALLERGIES:  is allergic to actos [pioglitazone], bee venom, codeine, metformin, statins, and raspberry.  MEDICATIONS:  Current Outpatient Medications  Medication Sig Dispense Refill   apixaban (ELIQUIS) 5 MG TABS tablet Take 1 tablet (5 mg total) by mouth 2 (two) times daily. 60 tablet 11   Continuous Blood Gluc Sensor (DEXCOM G7 SENSOR) MISC by Does not apply route.     dorzolamide-timolol (COSOPT) 22.3-6.8 MG/ML ophthalmic solution Place 1 drop into the left eye 2 (two) times daily.     furosemide (LASIX) 40 MG tablet Take 40 mg by mouth as needed for fluid or edema (Once every two weeks).     insulin aspart (NOVOLOG) 100 UNIT/ML injection Inject 18-20 Units into the skin 3 (three) times daily with meals. (Patient taking differently: Inject 15-20 Units into the skin 3 (three) times daily with meals. Sliding scale) 30 mL 1   Insulin Pen Needle (PEN NEEDLES) 32G X 4 MM  MISC Use as instructed to inject insulin 100 each 3   Insulin Syringe-Needle U-100 (  BD VEO INSULIN SYRINGE U/F) 31G X 15/64" 0.5 ML MISC Use to inject 4 times a day 400 each 3   latanoprost (XALATAN) 0.005 % ophthalmic solution Place 1 drop into the left eye at bedtime.     lisinopril (ZESTRIL) 5 MG tablet TAKE 1 TABLET BY MOUTH EVERY DAY 90 tablet 2   metoprolol succinate (TOPROL-XL) 50 MG 24 hr tablet Take 50 mg by mouth 2 (two) times daily. Take with or immediately following a meal.     NOVOLIN N 100 UNIT/ML injection INJECT 22 UNITS IN THE SKIN DAILY AT BEDTIME. (Patient taking differently: Inject 20-24 Units into the skin at bedtime.) 10 mL 2   potassium chloride (KLOR-CON) 10 MEQ tablet Take 10 mEq by mouth as needed (with lasix). Once every two weeks     No current facility-administered medications for this visit.    REVIEW OF SYSTEMS:   Constitutional: ( - ) fevers, ( - )  chills , ( - ) night sweats Eyes: ( - ) blurriness of vision, ( - ) double vision, ( - ) watery eyes Ears, nose, mouth, throat, and face: ( - ) mucositis, ( - ) sore throat Respiratory: ( - ) cough, ( - ) dyspnea, ( - ) wheezes Cardiovascular: ( - ) palpitation, ( - ) chest discomfort, ( - ) lower extremity swelling Gastrointestinal:  ( - ) nausea, ( - ) heartburn, ( - ) change in bowel habits Skin: ( - ) abnormal skin rashes Lymphatics: ( - ) new lymphadenopathy, ( - ) easy bruising Neurological: ( - ) numbness, ( - ) tingling, ( - ) new weaknesses Behavioral/Psych: ( - ) mood change, ( - ) new changes  All other systems were reviewed with the patient and are negative.  PHYSICAL EXAMINATION:  Vitals:   07/13/22 1302  BP: (!) 142/67  Pulse: 77  Resp: 17  Temp: 97.7 F (36.5 C)  SpO2: 100%   Filed Weights   07/13/22 1302  Weight: 195 lb 9.6 oz (88.7 kg)    GENERAL: well appearing elderly Caucasian male in NAD  SKIN: skin color, texture, turgor are normal, no rashes or significant lesions EYES:  conjunctiva are pink and non-injected, sclera clear LUNGS: clear to auscultation and percussion with normal breathing effort HEART: regular rate & rhythm and no murmurs and no lower extremity edema Musculoskeletal: no cyanosis of digits and no clubbing  PSYCH: alert & oriented x 3, fluent speech NEURO: no focal motor/sensory deficits  LABORATORY DATA:  I have reviewed the data as listed    Latest Ref Rng & Units 07/13/2022    1:37 PM 07/02/2022    7:52 AM 06/08/2022    9:14 AM  CBC  WBC 4.0 - 10.5 K/uL 7.6   9.2   Hemoglobin 13.0 - 17.0 g/dL 11.3  10.5  11.4   Hematocrit 39.0 - 52.0 % 33.9  31.0  35.2   Platelets 150 - 400 K/uL 268   229        Latest Ref Rng & Units 07/02/2022    7:52 AM 06/08/2022    9:14 AM 06/04/2022   12:57 AM  CMP  Glucose 70 - 99 mg/dL 317  232  226   BUN 8 - 23 mg/dL 21  25  17    Creatinine 0.61 - 1.24 mg/dL 1.20  1.26  1.16   Sodium 135 - 145 mmol/L 136  137  134   Potassium 3.5 - 5.1 mmol/L 4.6  4.0  4.2  Chloride 98 - 111 mmol/L 102  97  99   CO2 20 - 29 mmol/L  24  24   Calcium 8.6 - 10.2 mg/dL  9.1  8.6      ASSESSMENT & PLAN Matthew [name] 77 y.o. male with medical history significant for HLD, migraine, paroxysmal atrial fibrillation, DM type II, and testicular cancer who presents for evaluation of anemia and elevated serum free light chain ratio.   After review of the labs, review of the records, and discussion with the patient the patients findings are most consistent with normocytic anemia of unclear etiology and elevated serum free light chain ratio.  At this time I have a low suspicion for AL amyloidosis as the cause of his findings cardiac findings.   # Normocytic Anemia, Chronic --Will order full nutritional labs to include iron panel, ferritin, folate, and vitamin B12 -- Will also order reticulocyte panel and LDH -- Amyloidosis and multiple myeloma workup as noted below -- If no clear etiology can be found in our above workup would  recommend considering a bone marrow biopsy. -- Return to clinic pending the results of the above studies.  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  --will consider the need for a bone marrow biopsy pending the above results   Orders Placed This Encounter  Procedures   CBC with Differential ([place] Only)    Standing Status:   Future    Number of Occurrences:   1    Standing Expiration Date:   07/13/2023   CMP ([place] only)    Standing Status:   Future    Number of Occurrences:   1    Standing Expiration Date:   07/13/2023   Ferritin    Standing Status:   Future    Number of Occurrences:   1    Standing Expiration Date:   07/13/2023   Iron and Iron Binding Capacity ([facility] only)    Standing Status:   Future    Number of Occurrences:   1    Standing Expiration Date:   07/13/2023   Retic Panel    Standing Status:   Future    Number of Occurrences:   1    Standing Expiration Date:   07/13/2023   Vitamin B12    Standing Status:   Future    Number of Occurrences:   1    Standing Expiration Date:   07/13/2023   Folate, Serum    Standing Status:   Future    Number of Occurrences:   1    Standing Expiration Date:   07/13/2023   Methylmalonic acid, serum    Standing Status:   Future    Number of Occurrences:   1    Standing Expiration Date:   07/13/2023   Lactate dehydrogenase (LDH)    Standing Status:   Future    Number of Occurrences:   1    Standing Expiration Date:   07/13/2023   Multiple Myeloma Panel (SPEP&IFE w/QIG)    Standing Status:   Future    Number of Occurrences:   1    Standing Expiration Date:   07/13/2023   Kappa/lambda light chains    Standing Status:   Future  Number of Occurrences:   1    Standing  Expiration Date:   07/13/2023   Beta 2 microglobulin    Standing Status:   Future    Number of Occurrences:   1    Standing Expiration Date:   07/13/2023   24-Hr Ur UPEP/UIFE/Light Chains/TP    Standing Status:   Future    Standing Expiration Date:   07/13/2023    All questions were answered. The patient knows to call the clinic with any problems, questions or concerns.  A total of more than 60 minutes were spent on this encounter with face-to-face time and non-face-to-face time, including preparing to see the patient, ordering tests and/or medications, counseling the patient and coordination of care as outlined above.   Ledell Peoples, MD Department of Hematology/Oncology [facility] Phone: [phone] Pager: [phone] Email: [email or web address]  07/13/2022 2:13 PM

## 2022-07-14 LAB — KAPPA/LAMBDA LIGHT CHAINS
Kappa free light chain: 46.1 mg/L — ABNORMAL HIGH (ref 3.3–19.4)
Kappa, lambda light chain ratio: 1.8 — ABNORMAL HIGH (ref 0.26–1.65)
Lambda free light chains: 25.6 mg/L (ref 5.7–26.3)

## 2022-07-14 LAB — BETA 2 MICROGLOBULIN, SERUM: Beta-2 Microglobulin: 2.3 mg/L (ref 0.6–2.4)

## 2022-07-15 ENCOUNTER — Ambulatory Visit (HOSPITAL_COMMUNITY): Payer: Medicare HMO | Attending: Cardiology

## 2022-07-15 ENCOUNTER — Other Ambulatory Visit: Payer: Self-pay | Admitting: *Deleted

## 2022-07-15 DIAGNOSIS — R768 Other specified abnormal immunological findings in serum: Secondary | ICD-10-CM | POA: Diagnosis not present

## 2022-07-15 DIAGNOSIS — E785 Hyperlipidemia, unspecified: Secondary | ICD-10-CM | POA: Diagnosis not present

## 2022-07-15 DIAGNOSIS — D649 Anemia, unspecified: Secondary | ICD-10-CM | POA: Diagnosis not present

## 2022-07-15 DIAGNOSIS — E1165 Type 2 diabetes mellitus with hyperglycemia: Secondary | ICD-10-CM | POA: Diagnosis not present

## 2022-07-15 DIAGNOSIS — I43 Cardiomyopathy in diseases classified elsewhere: Secondary | ICD-10-CM | POA: Insufficient documentation

## 2022-07-15 DIAGNOSIS — Z87891 Personal history of nicotine dependence: Secondary | ICD-10-CM | POA: Diagnosis not present

## 2022-07-15 DIAGNOSIS — E854 Organ-limited amyloidosis: Secondary | ICD-10-CM | POA: Diagnosis not present

## 2022-07-15 DIAGNOSIS — Z8547 Personal history of malignant neoplasm of testis: Secondary | ICD-10-CM | POA: Diagnosis not present

## 2022-07-15 DIAGNOSIS — E11319 Type 2 diabetes mellitus with unspecified diabetic retinopathy without macular edema: Secondary | ICD-10-CM | POA: Diagnosis not present

## 2022-07-15 DIAGNOSIS — Z952 Presence of prosthetic heart valve: Secondary | ICD-10-CM | POA: Diagnosis not present

## 2022-07-15 MED ORDER — TECHNETIUM TC 99M PYROPHOSPHATE
20.9000 | Freq: Once | INTRAVENOUS | Status: AC
  Administered 2022-07-15: 20.9 via INTRAVENOUS

## 2022-07-17 LAB — MULTIPLE MYELOMA PANEL, SERUM
Albumin SerPl Elph-Mcnc: 3.3 g/dL (ref 2.9–4.4)
Albumin/Glob SerPl: 1 (ref 0.7–1.7)
Alpha 1: 0.3 g/dL (ref 0.0–0.4)
Alpha2 Glob SerPl Elph-Mcnc: 0.9 g/dL (ref 0.4–1.0)
B-Globulin SerPl Elph-Mcnc: 1.2 g/dL (ref 0.7–1.3)
Gamma Glob SerPl Elph-Mcnc: 1 g/dL (ref 0.4–1.8)
Globulin, Total: 3.4 g/dL (ref 2.2–3.9)
IgA: 345 mg/dL (ref 61–437)
IgG (Immunoglobin G), Serum: 1070 mg/dL (ref 603–1613)
IgM (Immunoglobulin M), Srm: 118 mg/dL (ref 15–143)
Total Protein ELP: 6.7 g/dL (ref 6.0–8.5)

## 2022-07-17 LAB — UPEP/UIFE/LIGHT CHAINS/TP, 24-HR UR
% BETA, Urine: 11.2 %
ALPHA 1 URINE: 4.1 %
Albumin, U: 72.2 %
Alpha 2, Urine: 6.1 %
Free Kappa Lt Chains,Ur: 48.4 mg/L (ref 1.17–86.46)
Free Kappa/Lambda Ratio: 5.31 (ref 1.83–14.26)
Free Lambda Lt Chains,Ur: 9.12 mg/L (ref 0.27–15.21)
GAMMA GLOBULIN URINE: 6.5 %
Total Protein, Urine-Ur/day: 411 mg/24 hr — ABNORMAL HIGH (ref 30–150)
Total Protein, Urine: 39.1 mg/dL
Total Volume: 1050

## 2022-07-17 LAB — METHYLMALONIC ACID, SERUM: Methylmalonic Acid, Quantitative: 182 nmol/L (ref 0–378)

## 2022-07-20 ENCOUNTER — Telehealth: Payer: Self-pay | Admitting: *Deleted

## 2022-07-20 MED ORDER — VITAMIN B-12 100 MCG PO TABS: 100.0000 ug | ORAL_TABLET | Freq: Every day | ORAL | 5 refills | Status: DC

## 2022-07-20 NOTE — Telephone Encounter (Signed)
-----   Message from Orson Slick, MD sent at 07/20/2022  1:08 PM EDT ----- Please let [name] know that his labs showed a borderline low Vitamin b12 level. I would recommend he start 1000 mcg Vitamin B12 PO daily (please call into a pharmacy of his choosing). Additionally please request a follow up visit with labs in 6 months time to re-evaluate.   ----- Message ----- From: Buel Ream, Lab In [place] Sent: 07/13/2022   1:58 PM EDT To: Orson Slick, MD

## 2022-07-20 NOTE — Telephone Encounter (Signed)
Notified of message below. Verbalized understanding 

## 2022-07-21 ENCOUNTER — Telehealth: Payer: Self-pay | Admitting: Hematology and Oncology

## 2022-07-21 NOTE — Telephone Encounter (Signed)
Per 3/25 IB reached out to patient to schedule; patient aware of date and time of appointment,

## 2022-07-28 ENCOUNTER — Encounter (HOSPITAL_COMMUNITY): Payer: Self-pay

## 2022-07-28 ENCOUNTER — Telehealth: Payer: Self-pay

## 2022-07-28 ENCOUNTER — Other Ambulatory Visit: Payer: Self-pay | Admitting: Cardiology

## 2022-07-28 DIAGNOSIS — Z952 Presence of prosthetic heart valve: Secondary | ICD-10-CM

## 2022-07-28 NOTE — Telephone Encounter (Signed)
Clinical notes faxed to [facility] through [facility] per fax request

## 2022-08-06 ENCOUNTER — Telehealth: Payer: Self-pay

## 2022-08-06 NOTE — Telephone Encounter (Signed)
Inbound fax requesting forms be completed and faxed with recent clinical notes. Forms completed and faxed to [phone].

## 2022-08-08 ENCOUNTER — Other Ambulatory Visit: Payer: Self-pay | Admitting: Endocrinology

## 2022-08-09 ENCOUNTER — Other Ambulatory Visit: Payer: Self-pay | Admitting: Endocrinology

## 2022-08-24 ENCOUNTER — Other Ambulatory Visit: Payer: Self-pay | Admitting: Endocrinology

## 2022-08-25 ENCOUNTER — Ambulatory Visit: Payer: Medicare HMO | Admitting: Endocrinology

## 2022-09-02 DIAGNOSIS — Z961 Presence of intraocular lens: Secondary | ICD-10-CM | POA: Diagnosis not present

## 2022-09-02 DIAGNOSIS — H5203 Hypermetropia, bilateral: Secondary | ICD-10-CM | POA: Diagnosis not present

## 2022-09-02 DIAGNOSIS — H401121 Primary open-angle glaucoma, left eye, mild stage: Secondary | ICD-10-CM | POA: Diagnosis not present

## 2022-09-02 DIAGNOSIS — E113213 Type 2 diabetes mellitus with mild nonproliferative diabetic retinopathy with macular edema, bilateral: Secondary | ICD-10-CM | POA: Diagnosis not present

## 2022-09-02 LAB — HM DIABETES EYE EXAM

## 2022-09-04 ENCOUNTER — Ambulatory Visit: Payer: Medicare HMO | Attending: Cardiology | Admitting: Cardiology

## 2022-09-04 ENCOUNTER — Encounter: Payer: Self-pay | Admitting: Cardiology

## 2022-09-04 ENCOUNTER — Ambulatory Visit (INDEPENDENT_AMBULATORY_CARE_PROVIDER_SITE_OTHER): Payer: Medicare HMO

## 2022-09-04 VITALS — BP 140/76 | HR 69 | Ht 66.0 in | Wt 192.0 lb

## 2022-09-04 DIAGNOSIS — I442 Atrioventricular block, complete: Secondary | ICD-10-CM | POA: Diagnosis not present

## 2022-09-04 DIAGNOSIS — I2510 Atherosclerotic heart disease of native coronary artery without angina pectoris: Secondary | ICD-10-CM | POA: Diagnosis not present

## 2022-09-04 DIAGNOSIS — I484 Atypical atrial flutter: Secondary | ICD-10-CM

## 2022-09-04 LAB — CUP PACEART REMOTE DEVICE CHECK
Battery Remaining Longevity: 97 mo
Battery Remaining Percentage: 95.5 %
Battery Voltage: 3.04 V
Brady Statistic AP VP Percent: 1 %
Brady Statistic AP VS Percent: 13 %
Brady Statistic AS VP Percent: 1 %
Brady Statistic AS VS Percent: 86 %
Brady Statistic RA Percent Paced: 12 %
Brady Statistic RV Percent Paced: 1 %
Date Time Interrogation Session: 20240510034013
Implantable Lead Connection Status: 753985
Implantable Lead Connection Status: 753985
Implantable Lead Implant Date: 20240207
Implantable Lead Implant Date: 20240207
Implantable Lead Location: 753859
Implantable Lead Location: 753860
Implantable Pulse Generator Implant Date: 20240207
Lead Channel Impedance Value: 430 Ohm
Lead Channel Impedance Value: 560 Ohm
Lead Channel Pacing Threshold Amplitude: 0.75 V
Lead Channel Pacing Threshold Amplitude: 0.75 V
Lead Channel Pacing Threshold Pulse Width: 0.5 ms
Lead Channel Pacing Threshold Pulse Width: 0.5 ms
Lead Channel Sensing Intrinsic Amplitude: 12 mV
Lead Channel Sensing Intrinsic Amplitude: 2.6 mV
Lead Channel Setting Pacing Amplitude: 3.5 V
Lead Channel Setting Pacing Amplitude: 3.5 V
Lead Channel Setting Pacing Pulse Width: 0.5 ms
Lead Channel Setting Sensing Sensitivity: 2 mV
Pulse Gen Model: 2272

## 2022-09-04 LAB — CUP PACEART INCLINIC DEVICE CHECK
Battery Remaining Longevity: 141 mo
Battery Voltage: 3.04 V
Brady Statistic RA Percent Paced: 12 %
Brady Statistic RV Percent Paced: 0.08 %
Date Time Interrogation Session: 20240510160704
Implantable Lead Connection Status: 753985
Implantable Lead Connection Status: 753985
Implantable Lead Implant Date: 20240207
Implantable Lead Implant Date: 20240207
Implantable Lead Location: 753859
Implantable Lead Location: 753860
Implantable Pulse Generator Implant Date: 20240207
Lead Channel Impedance Value: 425 Ohm
Lead Channel Impedance Value: 550 Ohm
Lead Channel Pacing Threshold Amplitude: 0.5 V
Lead Channel Pacing Threshold Amplitude: 0.5 V
Lead Channel Pacing Threshold Amplitude: 0.75 V
Lead Channel Pacing Threshold Amplitude: 0.75 V
Lead Channel Pacing Threshold Pulse Width: 0.5 ms
Lead Channel Pacing Threshold Pulse Width: 0.5 ms
Lead Channel Pacing Threshold Pulse Width: 0.5 ms
Lead Channel Pacing Threshold Pulse Width: 0.5 ms
Lead Channel Sensing Intrinsic Amplitude: 12 mV
Lead Channel Sensing Intrinsic Amplitude: 2.9 mV
Lead Channel Setting Pacing Amplitude: 2 V
Lead Channel Setting Pacing Amplitude: 2.5 V
Lead Channel Setting Pacing Pulse Width: 0.5 ms
Lead Channel Setting Sensing Sensitivity: 2 mV
Pulse Gen Model: 2272
Pulse Gen Serial Number: 8154174

## 2022-09-04 NOTE — Progress Notes (Signed)
Electrophysiology Office Note   Date:  09/04/2022   ID:  Matthew [name], DOB Nov 08, 1945, MRN [id number]  PCP:  [name], MD  Cardiologist:  Ladona Ridgel Primary Electrophysiologist: Octavio Manns, MD    No chief complaint on file.     History of Present Illness: Matthew [name] is a 77 y.o. male who is being seen today for the evaluation of atrial flutter at the request of [name], MD. Presenting today for electrophysiology evaluation.   He has a history of significant coronary disease post CABG in 2011, atypical atrial flutter, hyperlipidemia, aortic stenosis.  He is status post TAVR 06/02/2022.  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  Today, denies symptoms of palpitations, chest pain, shortness of breath, orthopnea, PND, lower extremity edema, claudication, dizziness, presyncope, syncope, bleeding, or neurologic sequela. The patient is tolerating medications without difficulties.  He has mild fatigue.  He has no chest pain.  He is being worked up by hematology.  He is fortunately remained in normal rhythm.  Pacemaker function is normal.   Past Medical History:  Diagnosis Date   Anemia    Arthritis    "back, left ankle" (11/20/2015)   Cataract    CHF (congestive heart failure) (HCC)    Chronic lower back pain    Coronary artery disease    a. 05/2009 CABG x 3: LIMA->LAD, VG->OM, VG->PDA; b. Nuc 01/2014: inf-lat scar but no ischemia, EF 57%.   Diabetic retinopathy (HCC)    mild- Dr. [name]   Glaucoma    Hyperlipidemia    a. Intolerant of lipitor and vytorin.   Lyme disease    Migraine    "once or twice" (11/20/2015)   Myocardial infarction [facility])    'saw evidence of it on an EKG done in 2005"   Paroxysmal atrial flutter (HCC)    a. 01/2014 s/p DCCV;  b. CHA2DS2VASc = 3-->Eliquis.   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 Clifton James and Dr. Laneta Simmers   Sacral pain    "right"   Sciatica of right side    Testicular cancer (HCC) 1982   "heavy doses of radiation; it was stage 2"   Type II diabetes mellitus (HCC)    Valvular heart disease    a. 01/2014 Echo: Ef 60-65%, no rwma, mild AI/MS, mod MR, mildly dil LA.   Past Surgical History:  Procedure Laterality Date   ABDOMINAL EXPLORATION SURGERY  ~ 2005   "for hernia, but didn't have one"   ATRIAL FIBRILLATION ABLATION N/A 09/17/2016   Procedure: Atrial Fibrillation Ablation;  Surgeon: Regan Lemming, MD;  Location: [facility] INVASIVE CV LAB;  Service: Cardiovascular;  Laterality: N/A;   CARDIAC CATHETERIZATION  2011   CARDIOVERSION N/A 02/19/2014   Procedure: CARDIOVERSION;  Surgeon: Lesleigh Noe, MD;  Location: [facility] ENDOSCOPY;  Service: Cardiovascular;  Laterality: N/A;   CARDIOVERSION N/A 10/28/2015   Procedure: CARDIOVERSION;  Surgeon: Vesta Mixer, MD;  Location: [facility] ENDOSCOPY;  Service: Cardiovascular;  Laterality: N/A;   CARDIOVERSION  11/20/2015   "200 joules"   CARDIOVERSION N/A 07/02/2022   Procedure: CARDIOVERSION;  Surgeon: Jodelle Red, MD;  Location: [facility] ENDOSCOPY;  Service: Cardiovascular;  Laterality: N/A;   CORONARY ARTERY BYPASS GRAFT  05/2009   LIMA-LAD, SVG-OM, SVG-PDA 06/20/09   ELBOW FRACTURE SURGERY Left    broken ulna on left  elbow-surgical repair   ELECTROPHYSIOLOGIC STUDY N/A 10/31/2014   Procedure: A-Flutter;  Surgeon: Marinus Maw, MD;  Location: [facility] INVASIVE CV LAB;  Service: Cardiovascular;  Laterality: N/A;   ELECTROPHYSIOLOGIC STUDY N/A 11/20/2015   Procedure: A-Flutter Ablation;  Surgeon: Marinus Maw, MD;  Location: [facility] INVASIVE CV LAB;  Service: Cardiovascular;  Laterality: N/A;   EYE SURGERY     FRACTURE SURGERY     INTRAOPERATIVE TRANSTHORACIC ECHOCARDIOGRAM N/A 06/02/2022   Procedure: INTRAOPERATIVE TRANSTHORACIC ECHOCARDIOGRAM;  Surgeon: Kathleene Hazel, MD;  Location: [facility] OR;  Service:  Open Heart Surgery;  Laterality: N/A;   PACEMAKER IMPLANT N/A 06/03/2022   Procedure: PACEMAKER IMPLANT;  Surgeon: Regan Lemming, MD;  Location: [facility] INVASIVE CV LAB;  Service: Cardiovascular;  Laterality: N/A;   RIGHT/LEFT HEART CATH AND CORONARY/GRAFT ANGIOGRAPHY N/A 04/13/2022   Procedure: RIGHT/LEFT HEART CATH AND CORONARY/GRAFT ANGIOGRAPHY;  Surgeon: Kathleene Hazel, MD;  Location: [facility] INVASIVE CV LAB;  Service: Cardiovascular;  Laterality: N/A;   TEE WITHOUT CARDIOVERSION N/A 03/13/2022   Procedure: TRANSESOPHAGEAL ECHOCARDIOGRAM (TEE);  Surgeon: Thurmon Fair, MD;  Location: [facility] ENDOSCOPY;  Service: Cardiovascular;  Laterality: N/A;   TESTICLE REMOVAL Right 1982   TONSILLECTOMY  ~ 1967   TRANSCATHETER AORTIC VALVE REPLACEMENT, TRANSFEMORAL N/A 06/02/2022   Procedure: Transcatheter Aortic Valve Replacement, Transfemoral;  Surgeon: Kathleene Hazel, MD;  Location: [facility] OR;  Service: Open Heart Surgery;  Laterality: N/A;     Current Outpatient Medications  Medication Sig Dispense Refill   apixaban (ELIQUIS) 5 MG TABS tablet Take 1 tablet (5 mg total) by mouth 2 (two) times daily. 60 tablet 11   Continuous Blood Gluc Sensor (DEXCOM G7 SENSOR) MISC by Does not apply route.     dorzolamide-timolol (COSOPT) 22.3-6.8 MG/ML ophthalmic solution Place 1 drop into the left eye 2 (two) times daily.     furosemide (LASIX) 40 MG tablet Take 40 mg by mouth as needed for fluid or edema (Once every two weeks).     insulin aspart (NOVOLOG) 100 UNIT/ML injection Inject 15-20 Units into the skin 3 (three) times daily with meals. Sliding scale 30 mL 1   insulin NPH Human (NOVOLIN N) 100 UNIT/ML injection INJECT 0.2-0.24 MLS (20-24 UNITS TOTAL) INTO THE SKIN AT BEDTIME. 30 mL 0   Insulin Pen Needle (PEN NEEDLES) 32G X 4 MM MISC Use as instructed to inject insulin 100 each 3   Insulin Syringe-Needle U-100 (BD VEO INSULIN SYRINGE U/F) 31G X 15/64" 0.5 ML MISC Use to inject 4 times a day 400 each 3    latanoprost (XALATAN) 0.005 % ophthalmic solution Place 1 drop into the left eye at bedtime.     lisinopril (ZESTRIL) 5 MG tablet TAKE 1 TABLET BY MOUTH EVERY DAY 90 tablet 2   metoprolol succinate (TOPROL-XL) 50 MG 24 hr tablet Take 50 mg by mouth 2 (two) times daily. Take with or immediately following a meal.     potassium chloride (KLOR-CON) 10 MEQ tablet Take 10 mEq by mouth as needed (with lasix). Once every two weeks     vitamin B-12 (CYANOCOBALAMIN) 100 MCG tablet Take 1 tablet (100 mcg total) by mouth daily. 30 tablet 5   No current facility-administered medications for this visit.    Allergies:   Actos [pioglitazone], Bee venom, Codeine, Metformin, Statins, and Raspberry   Social History:  The patient  reports that he quit smoking about 34 years ago. His smoking use included cigarettes. He has a 7.50 pack-year smoking history. He has  never used smokeless tobacco. He reports current alcohol use of about 3.0 standard drinks of alcohol per week. He reports that he does not use drugs.   Family History:  The patient's family history includes Diabetes in his father, mother, and sister; Heart attack in his father; Heart disease in his father and mother; Hypertension in his father.   ROS:  Please see the history of present illness.   Otherwise, review of systems is positive for none.   All other systems are reviewed and negative.   PHYSICAL EXAM: VS:  BP (!) 140/76   Pulse 69   Ht 5\' 6"  (1.676 m)   Wt 192 lb (87.1 kg)   SpO2 99%   BMI 30.99 kg/m  , BMI Body mass index is 30.99 kg/m. GEN: Well nourished, well developed, in no acute distress  HEENT: normal  Neck: no JVD, carotid bruits, or masses Cardiac: RRR; no murmurs, rubs, or gallops,no edema  Respiratory:  clear to auscultation bilaterally, normal work of breathing GI: soft, nontender, nondistended, + BS MS: no deformity or atrophy  Skin: warm and dry, device site well healed Neuro:  Strength and sensation are intact Psych:  euthymic mood, full affect  EKG:  EKG is ordered today. Personal review of the ekg ordered shows sinus rhythm, RBBB  Personal review of the device interrogation today. Results in Paceart   Recent Labs: 10/23/2021: Pro B Natriuretic peptide (BNP) 223.0; TSH 2.24 06/03/2022: Magnesium 1.9 07/13/2022: ALT 12; BUN 17; Creatinine 1.15; Hemoglobin 11.3; Platelet Count 268; Potassium 4.1; Sodium 137    Lipid Panel     Component Value Date/Time   CHOL 228 (H) 07/07/2021 1018   CHOL 230 (H) 07/08/2020 1318   CHOL 216 (H) 06/21/2013 1405   TRIG 177.0 (H) 07/07/2021 1018   TRIG 132 06/21/2013 1405   HDL 45.40 07/07/2021 1018   HDL 50 07/08/2020 1318   HDL 54 06/21/2013 1405   CHOLHDL 5 07/07/2021 1018   VLDL 35.4 07/07/2021 1018   LDLCALC 147 (H) 07/07/2021 1018   LDLCALC 164 (H) 07/08/2020 1318   LDLCALC 136 (H) 06/21/2013 1405     Wt Readings from Last 3 Encounters:  09/04/22 192 lb (87.1 kg)  07/15/22 196 lb (88.9 kg)  07/13/22 195 lb 9.6 oz (88.7 kg)      Other studies Reviewed: Additional studies/ records that were reviewed today include: TTE  07/03/22 Review of the above records today demonstrates:   1. Left ventricular ejection fraction, by estimation, is 40 to 45%. The  left ventricle has mildly decreased function. The left ventricle  demonstrates regional wall motion abnormalities (see scoring  diagram/findings for description).  Inferior/inferolateral hypokinesis. Left ventricular diastolic parameters  are consistent with Grade [name] diastolic dysfunction (restrictive).  Elevated left atrial pressure.   2. Right ventricular systolic function was not well visualized. The right  ventricular size is not well visualized.   3. Right atrial size was mildly dilated.   4. The mitral valve is degenerative. Mild mitral valve regurgitation.  Mild to moderate mitral stenosis. The mean mitral valve gradient is 5.0  mmHg with average heart rate of 70 bpm. MVA 1.1 cm^2 by continuity   equation. Severe mitral annular  calcification.   5. The aortic valve has been repaired/replaced. Aortic valve  regurgitation is not visualized. There is a 29 mm Edwards Sapien  prosthetic (TAVR) valve present in the aortic position. Echo findings are  consistent with normal structure and function of the  aortic valve prosthesis.  Aortic valve mean gradient measures 5.0 mmHg.   6. The inferior vena cava is normal in size with greater than 50%  respiratory variability, suggesting right atrial pressure of 3 mmHg.    ASSESSMENT AND PLAN:  1.  Atypical atrial flutter: Status post ablation 09/17/2016.  CHA2DS2-VASc of 4.  No further arrhythmias and wanted to stop Eliquis.  2.  Coronary disease: No current chest pain.  Continue with current management.  3.  Hyperlipidemia: Has statin myopathy.  Plan per primary cardiology.  4.  Aortic stenosis: Status post TAVR.  Plan per primary cardiology.  5.  Mitral regurgitation/stenosis: Plan per primary cardiology.  6.  Intermittent complete heart block: Occurred post TAVR.  Post Abbott dual-chamber pacemaker.  Device functioning appropriately.  No changes.  Current medicines are reviewed at length with the patient today.   The patient does not have concerns regarding his medicines.  The following changes were made today: None  Labs/ tests ordered today include:  Orders Placed This Encounter  Procedures   CUP PACEART INCLINIC DEVICE CHECK   EKG 12-Lead      Disposition:   FU 12 months  Signed, Casie Sturgeon Jorja Loa, MD  09/04/2022 4:11 PM     [facility] [address] [place] [address] [phone] (office) [phone] (fax)

## 2022-09-08 DIAGNOSIS — Z794 Long term (current) use of insulin: Secondary | ICD-10-CM | POA: Diagnosis not present

## 2022-09-08 DIAGNOSIS — E113511 Type 2 diabetes mellitus with proliferative diabetic retinopathy with macular edema, right eye: Secondary | ICD-10-CM | POA: Diagnosis not present

## 2022-09-08 DIAGNOSIS — H43822 Vitreomacular adhesion, left eye: Secondary | ICD-10-CM | POA: Diagnosis not present

## 2022-09-08 DIAGNOSIS — E113512 Type 2 diabetes mellitus with proliferative diabetic retinopathy with macular edema, left eye: Secondary | ICD-10-CM | POA: Diagnosis not present

## 2022-09-08 DIAGNOSIS — H401131 Primary open-angle glaucoma, bilateral, mild stage: Secondary | ICD-10-CM | POA: Diagnosis not present

## 2022-09-10 ENCOUNTER — Other Ambulatory Visit (HOSPITAL_COMMUNITY): Payer: Self-pay | Admitting: Orthopedic Surgery

## 2022-09-10 DIAGNOSIS — E113511 Type 2 diabetes mellitus with proliferative diabetic retinopathy with macular edema, right eye: Secondary | ICD-10-CM | POA: Diagnosis not present

## 2022-09-10 DIAGNOSIS — M5451 Vertebrogenic low back pain: Secondary | ICD-10-CM | POA: Diagnosis not present

## 2022-09-10 DIAGNOSIS — M5459 Other low back pain: Secondary | ICD-10-CM

## 2022-09-10 DIAGNOSIS — M545 Low back pain, unspecified: Secondary | ICD-10-CM | POA: Insufficient documentation

## 2022-09-11 ENCOUNTER — Telehealth (HOSPITAL_COMMUNITY): Payer: Self-pay

## 2022-09-11 DIAGNOSIS — T466X5A Adverse effect of antihyperlipidemic and antiarteriosclerotic drugs, initial encounter: Secondary | ICD-10-CM

## 2022-09-11 NOTE — Telephone Encounter (Signed)
Called and spoke with pt in regards to CR, pt stated he is not interested at this time due to other financial obligations. Did adv pt he is able to do 2/days a week.   Closed referral

## 2022-09-11 NOTE — Progress Notes (Signed)
[facility]) [facility] Quality Pharmacy Team Statin Quality Measure Assessment  09/11/2022  Matthew [name] 10/17/1945 [id number]  Per review of chart and payor information, patient has a diagnosis of diabetes and cardiovascular disease but is not currently filling a statin prescription.  This places patient into the Statin Use In Patients with Diabetes (SUPD) and Statin Use in Patients with Cardiovascular Disease (SPC) measures for CMS.    Patient has documented allergy to statin but no corresponding CPT codes that would exclude patient from SUPD and [facility] measure.     Component Value Date/Time   CHOL 228 (H) 07/07/2021 1018   CHOL 230 (H) 07/08/2020 1318   CHOL 216 (H) 06/21/2013 1405   TRIG 177.0 (H) 07/07/2021 1018   TRIG 132 06/21/2013 1405   HDL 45.40 07/07/2021 1018   HDL 50 07/08/2020 1318   HDL 54 06/21/2013 1405   CHOLHDL 5 07/07/2021 1018   VLDL 35.4 07/07/2021 1018   LDLCALC 147 (H) 07/07/2021 1018   LDLCALC 164 (H) 07/08/2020 1318   LDLCALC 136 (H) 06/21/2013 1405    Please consider ONE of the following recommendations:  Initiate high intensity statin Atorvastatin 40 mg once daily, #90, 3 refills   Rosuvastatin 20 mg once daily, #90, 3 refills    Initiate moderate intensity  statin with reduced frequency if prior  statin intolerance 1x weekly, #13, 3 refills   2x weekly, #26, 3 refills   3x weekly, #39, 3 refills    Code for past statin intolerance  (required annually)  Provider Requirements: Must associate code during an office visit or telehealth encounter   Drug Induced Myopathy G72.0   Myositis, unspecified M60.9   Myopathy, unspecified G72.9   Rhabdomyolysis  M62.82   Myalgia (SPC ONLY) M79.1   Alcoholic cirrhosis of liver without ascites K70.30   Alcoholic cirrhosis of liver with ascites K70.31   Unspecified cirrhosis of liver K74.60   Toxic liver disease with fibrosis and cirrhosis of liver K71.7   Thank you for allowing [facility]  pharmacy to be a part of this patient's care.   Harlon Flor, PharmD Clinical Pharmacist  [facility] [phone]

## 2022-09-11 NOTE — Telephone Encounter (Signed)
Pt insurance is active and benefits verified through [facility]. Co-pay $35.00, DED $0.00/$0.00 met, out of pocket $4,500.00/$1,615.78 met, co-insurance 0%. No pre-authorization required. Passport, 09/11/22 @ 12:13PM, REF#[id number]   How many CR sessions are covered? (36 sessions/visits for TCR, 72 sessions/visits for ICR)72 Is this a lifetime maximum or an annual maximum? Annual Has the member used any of these services to date? No Is there a time limit (weeks/months) on start of program and/or program completion? No     Will contact patient to see if he is interested in the Cardiac Rehab Program.

## 2022-09-14 ENCOUNTER — Ambulatory Visit (INDEPENDENT_AMBULATORY_CARE_PROVIDER_SITE_OTHER): Payer: Medicare HMO | Admitting: Family Medicine

## 2022-09-14 ENCOUNTER — Encounter: Payer: Self-pay | Admitting: Family Medicine

## 2022-09-14 VITALS — BP 130/60 | HR 79 | Temp 98.0°F | Ht 64.0 in | Wt 194.0 lb

## 2022-09-14 DIAGNOSIS — I509 Heart failure, unspecified: Secondary | ICD-10-CM

## 2022-09-14 DIAGNOSIS — Z952 Presence of prosthetic heart valve: Secondary | ICD-10-CM

## 2022-09-14 DIAGNOSIS — E538 Deficiency of other specified B group vitamins: Secondary | ICD-10-CM | POA: Diagnosis not present

## 2022-09-14 DIAGNOSIS — E785 Hyperlipidemia, unspecified: Secondary | ICD-10-CM | POA: Diagnosis not present

## 2022-09-14 DIAGNOSIS — I4891 Unspecified atrial fibrillation: Secondary | ICD-10-CM

## 2022-09-14 NOTE — Patient Instructions (Signed)
Today.  If there are specific questions regarding the labs from hematology I would recommend contacting their office to review this further.  I do see that the B12 appeared to be the only concern and suspect the supplements should be sufficient.  That can be rechecked with your next blood work with hematology.  Keep follow-up with your specialist, no medication changes from me at this time.  Let me know if you need another referral for your back.  Hang in there!

## 2022-09-14 NOTE — Progress Notes (Unsigned)
Subjective:  Patient ID: Matthew [name], male    DOB: 03-22-1946  Age: 77 y.o. MRN: [id number]  CC:  Chief Complaint  Patient presents with   Follow-up    Pt reports he is following up blood work from hematology.     HPI Matthew [name] presents for   Cardiac History of aortic valve replacement, CHF, atrial fibrillation, heart block, followed by cardiology.  Pacemaker implanted 06/03/2022.  Furosemide if needed for swelling.  Prior pedal edema improved overnight.  Eliquis for anticoagulation, lisinopril 5 mg QD.  Electrophysiology appointment May 10 noted.  Atypical atrial flutter status post ablation, no further arrhythmias. Chads2vasc score of 4. Pacemaker functioning appropriately on eval. No new bleeding.  No recent palpitations.  Furosemide every 2-3 weeks for swelling. Stable, improved after valve replacement.   Saw back specialist last week. Recommended MRI. Noted scoliosis and possible surgery. Dr. Shon Baton. He is hesitant to proceed with surgery. Planning on MRI, then possible second opinion.   Followed by endocrine for diabetes.   Hyperlipidemia: History of statin myopathy. Other options have been discussed at cardiology, including injections, he declines any new treatments.   Lab Results  Component Value Date   CHOL 228 (H) 07/07/2021   HDL 45.40 07/07/2021   LDLCALC 147 (H) 07/07/2021   TRIG 177.0 (H) 07/07/2021   CHOLHDL 5 07/07/2021   Lab Results  Component Value Date   ALT 12 07/13/2022   AST 16 07/13/2022   ALKPHOS 95 07/13/2022   BILITOT 0.5 07/13/2022    Normocytic anemia Followed by hematology, Dr. Leonides Schanz office visit noted from March 18.  Labs obtained at that time, and plan for amyloidosis and multiple myeloma workup.  Consideration of bone marrow biopsy depending on workup. Lab results noted in March., B12 was low, started on 1000 mcg B12 daily, plan for repeat labs in 6 months. Taking B12 daily.  Unknown plan regarding blood work results,  other than plan followed up in September.  Lab Results  Component Value Date   VITAMINB12 200 07/13/2022    HM: Shingrix - declines.  Tdap - recommended at pharmacy.  Instructed Foot exam has been checked at endocrine.   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   Severe aortic stenosis 04/13/2022   Aortic stenosis 03/27/2022   Nonrheumatic aortic valve stenosis 03/13/2022   Mitral valve stenosis, non-rheumatic 03/13/2022   Myopathy, unspecified 07/09/2020   Statin myopathy 09/15/2019   Atrial flutter (HCC) 10/31/2014   Coronary artery disease    Paroxysmal atrial flutter (HCC)    Diabetes mellitus without complication (HCC)    Valvular heart disease    Atrial fibrillation (HCC) 01/25/2014   Diabetes mellitus, type 2 (HCC) 01/16/2014   Hyperlipidemia 01/16/2014   Erectile dysfunction associated with type 2 diabetes mellitus (HCC) 01/16/2014   Coronary atherosclerosis of native coronary artery 06/30/2013   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 05/2009 CABG x 3: LIMA->LAD, VG->OM, VG->PDA; b. Nuc 01/2014: inf-lat scar but no ischemia, EF 57%.   Diabetic retinopathy (HCC)  mild- Dr. [name]   Glaucoma    Hyperlipidemia    a. Intolerant of lipitor and vytorin.   Lyme disease    Migraine    "once or twice" (11/20/2015)   Myocardial infarction [facility])    'saw evidence of it on an EKG done in 2005"   Paroxysmal atrial flutter (HCC)    a. 01/2014 s/p DCCV;  b. CHA2DS2VASc = 3-->Eliquis.   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 Clifton James and Dr. Laneta Simmers   Sacral pain    "right"   Sciatica of right side    Testicular cancer (HCC) 1982   "heavy doses of radiation; it was stage 2"   Type II diabetes mellitus (HCC)    Valvular heart disease    a. 01/2014 Echo: Ef 60-65%, no rwma, mild AI/MS, mod MR, mildly dil LA.   Past Surgical History:  Procedure Laterality Date   ABDOMINAL EXPLORATION SURGERY  ~ 2005   "for hernia, but didn't have one"   ATRIAL FIBRILLATION ABLATION N/A 09/17/2016   Procedure: Atrial Fibrillation Ablation;  Surgeon: Regan Lemming, MD;  Location: [facility] INVASIVE CV LAB;  Service: Cardiovascular;  Laterality: N/A;   CARDIAC CATHETERIZATION  2011   CARDIOVERSION N/A 02/19/2014   Procedure: CARDIOVERSION;  Surgeon: Lesleigh Noe, MD;  Location: [facility] ENDOSCOPY;  Service: Cardiovascular;  Laterality: N/A;   CARDIOVERSION N/A 10/28/2015   Procedure: CARDIOVERSION;  Surgeon: Vesta Mixer, MD;  Location: [facility] ENDOSCOPY;  Service: Cardiovascular;  Laterality: N/A;   CARDIOVERSION  11/20/2015   "200 joules"   CARDIOVERSION N/A 07/02/2022   Procedure: CARDIOVERSION;  Surgeon: Jodelle Red, MD;  Location: [facility] ENDOSCOPY;  Service: Cardiovascular;  Laterality: N/A;   CORONARY ARTERY BYPASS GRAFT  05/2009   LIMA-LAD, SVG-OM, SVG-PDA 06/20/09   ELBOW FRACTURE SURGERY Left    broken ulna on left elbow-surgical repair   ELECTROPHYSIOLOGIC STUDY N/A 10/31/2014   Procedure: A-Flutter;  Surgeon: Marinus Maw, MD;  Location: [facility] INVASIVE CV LAB;  Service: Cardiovascular;  Laterality: N/A;   ELECTROPHYSIOLOGIC STUDY N/A 11/20/2015   Procedure: A-Flutter Ablation;  Surgeon: Marinus Maw, MD;  Location: [facility] INVASIVE CV LAB;  Service: Cardiovascular;  Laterality: N/A;   EYE SURGERY     FRACTURE SURGERY     INTRAOPERATIVE TRANSTHORACIC ECHOCARDIOGRAM N/A 06/02/2022   Procedure: INTRAOPERATIVE TRANSTHORACIC ECHOCARDIOGRAM;  Surgeon: Kathleene Hazel, MD;  Location: [facility] OR;   Service: Open Heart Surgery;  Laterality: N/A;   PACEMAKER IMPLANT N/A 06/03/2022   Procedure: PACEMAKER IMPLANT;  Surgeon: Regan Lemming, MD;  Location: [facility] INVASIVE CV LAB;  Service: Cardiovascular;  Laterality: N/A;   RIGHT/LEFT HEART CATH AND CORONARY/GRAFT ANGIOGRAPHY N/A 04/13/2022   Procedure: RIGHT/LEFT HEART CATH AND CORONARY/GRAFT ANGIOGRAPHY;  Surgeon: Kathleene Hazel, MD;  Location: [facility] INVASIVE CV LAB;  Service: Cardiovascular;  Laterality: N/A;   TEE WITHOUT CARDIOVERSION N/A 03/13/2022   Procedure: TRANSESOPHAGEAL ECHOCARDIOGRAM (TEE);  Surgeon: Thurmon Fair, MD;  Location: [facility] ENDOSCOPY;  Service: Cardiovascular;  Laterality: N/A;   TESTICLE REMOVAL Right 1982   TONSILLECTOMY  ~ 1967   TRANSCATHETER AORTIC VALVE REPLACEMENT, TRANSFEMORAL N/A 06/02/2022   Procedure: Transcatheter Aortic Valve Replacement, Transfemoral;  Surgeon: Kathleene Hazel, MD;  Location: [facility] OR;  Service: Open Heart Surgery;  Laterality: N/A;   Allergies  Allergen Reactions   Actos [Pioglitazone] Swelling   Bee Venom Swelling   Codeine Hives and Nausea Only  Metformin Nausea Only   Statins Other (See Comments)    Muscle pain, fatigue   Raspberry Itching   Prior to Admission medications   Medication Sig Start Date End Date Taking? Authorizing Provider  apixaban (ELIQUIS) 5 MG TABS tablet Take 1 tablet (5 mg total) by mouth 2 (two) times daily. 06/08/22  Yes Georgie Chard D, NP  Continuous Blood Gluc Sensor (DEXCOM G7 SENSOR) MISC by Does not apply route.   Yes [provider]  dorzolamide-timolol (COSOPT) 22.3-6.8 MG/ML ophthalmic solution Place 1 drop into the left eye 2 (two) times daily. 10/01/21  Yes [provider]  furosemide (LASIX) 40 MG tablet Take 40 mg by mouth as needed for fluid or edema (Once every two weeks).   Yes [provider]  insulin aspart (NOVOLOG) 100 UNIT/ML injection Inject 15-20 Units into the skin 3 (three) times daily with meals.  Sliding scale 08/09/22  Yes Reather Littler, MD  insulin NPH Human (NOVOLIN N) 100 UNIT/ML injection INJECT 0.2-0.24 MLS (20-24 UNITS TOTAL) INTO THE SKIN AT BEDTIME. 08/24/22  Yes Reather Littler, MD  Insulin Pen Needle (PEN NEEDLES) 32G X 4 MM MISC Use as instructed to inject insulin 06/24/22  Yes Reather Littler, MD  Insulin Syringe-Needle U-100 (BD VEO INSULIN SYRINGE U/F) 31G X 15/64" 0.5 ML MISC Use to inject 4 times a day 08/11/21  Yes Reather Littler, MD  latanoprost (XALATAN) 0.005 % ophthalmic solution Place 1 drop into the left eye at bedtime. 04/01/21  Yes [provider]  lisinopril (ZESTRIL) 5 MG tablet TAKE 1 TABLET BY MOUTH EVERY DAY 02/16/22  Yes Camnitz, Andree Coss, MD  metoprolol succinate (TOPROL-XL) 50 MG 24 hr tablet Take 50 mg by mouth 2 (two) times daily. Take with or immediately following a meal.   Yes [provider]  potassium chloride (KLOR-CON) 10 MEQ tablet Take 10 mEq by mouth as needed (with lasix). Once every two weeks   Yes [provider]  vitamin B-12 (CYANOCOBALAMIN) 100 MCG tablet Take 1 tablet (100 mcg total) by mouth daily. 07/20/22  Yes Jaci Standard, MD   Social History   Socioeconomic History   Marital status: Married    Spouse name: Not on file   Number of children: 0   Years of education: Not on file   Highest education level: Not on file  Occupational History   Occupation: Photo journalist  Tobacco Use   Smoking status: Former    Packs/day: 0.50    Years: 15.00    Additional pack years: 0.00    Total pack years: 7.50    Types: Cigarettes    Quit date: 1990    Years since quitting: 34.4   Smokeless tobacco: Never   Tobacco comments:    "quit smoking cigarettes in the 1980's; don't know how much or for how long"  Vaping Use   Vaping Use: Never used  Substance and Sexual Activity   Alcohol use: Yes    Alcohol/week: 3.0 standard drinks of alcohol    Types: 3 Cans of beer per week   Drug use: No    Comment: "quit smoking pot in  the 1990s"   Sexual activity: Not Currently    Birth control/protection: Coitus interruptus  Other Topics Concern   Not on file  Social History Narrative   Not on file   Social Determinants of Health   Financial Resource Strain: Not on file  Food Insecurity: No Food Insecurity (06/05/2022)   Hunger Vital Sign  Worried About Programme researcher, broadcasting/film/video in the Last Year: Never true    Ran Out of Food in the Last Year: Never true  Transportation Needs: No Transportation Needs (06/05/2022)   PRAPARE - Administrator, Civil Service (Medical): No    Lack of Transportation (Non-Medical): No  Physical Activity: Not on file  Stress: Not on file  Social Connections: Not on file  Intimate Partner Violence: Not At Risk (03/28/2022)   Humiliation, Afraid, Rape, and Kick questionnaire    Fear of Current or Ex-Partner: No    Emotionally Abused: No    Physically Abused: No    Sexually Abused: No    Review of Systems   Objective:   Vitals:   09/14/22 1145  BP: 130/60  Pulse: 79  Temp: 98 F (36.7 C)  TempSrc: Temporal  SpO2: 97%  Weight: 194 lb (88 kg)  Height: 5\' 4"  (1.626 m)     Physical Exam Vitals reviewed.  Constitutional:      Appearance: He is well-developed.  HENT:     Head: Normocephalic and atraumatic.  Neck:     Vascular: No carotid bruit or JVD.  Cardiovascular:     Rate and Rhythm: Normal rate and regular rhythm.     Heart sounds: Normal heart sounds. No murmur heard. Pulmonary:     Effort: Pulmonary effort is normal.     Breath sounds: Normal breath sounds. No rales.  Musculoskeletal:     Right lower leg: Edema (trace bilat at ankles) present.     Left lower leg: Edema present.  Skin:    General: Skin is warm and dry.  Neurological:     Mental Status: He is alert and oriented to person, place, and time.  Psychiatric:        Mood and Affect: Mood normal.      Assessment & Plan:  Albin L Israelson [name] is a 77 y.o. male . Atrial fibrillation,  unspecified type (HCC) Congestive heart failure, unspecified HF chronicity, unspecified heart failure type (HCC) S/P TAVR (transcatheter aortic valve replacement)  -Stable, tolerating anticoagulation, will continue given CHA2DS2-VASc score.  Appears euvolemic, continue follow-up with cardiology as planned.  Hyperlipidemia, unspecified hyperlipidemia type  -Discussed options including statin, Zetia, PCSK9 inhibitor and declines medication treatment at this time.  B12 deficiency  -Borderline, now on B12 supplement per hematology.  Other testing appears to have been reassuring from hematology but recommended he contact their office with any specific questions on those results and further plans.  44-month follow-up was planned with hematology.  Deferred labs for now.  Status post evaluation with back specialist as above, planned on further imaging, considering second opinion, but will let me know.  No orders of the defined types were placed in this encounter.  Patient Instructions  Today.  If there are specific questions regarding the labs from hematology I would recommend contacting their office to review this further.  I do see that the B12 appeared to be the only concern and suspect the supplements should be sufficient.  That can be rechecked with your next blood work with hematology.  Keep follow-up with your specialist, no medication changes from me at this time.  Let me know if you need another referral for your back.  Hang in there!    Signed,   Meredith Staggers, MD [facility], [facility] 09/14/22 12:30 PM

## 2022-09-15 ENCOUNTER — Encounter: Payer: Self-pay | Admitting: Family Medicine

## 2022-09-16 ENCOUNTER — Other Ambulatory Visit: Payer: Self-pay | Admitting: Cardiology

## 2022-09-22 DIAGNOSIS — E113511 Type 2 diabetes mellitus with proliferative diabetic retinopathy with macular edema, right eye: Secondary | ICD-10-CM | POA: Diagnosis not present

## 2022-09-22 DIAGNOSIS — E113512 Type 2 diabetes mellitus with proliferative diabetic retinopathy with macular edema, left eye: Secondary | ICD-10-CM | POA: Diagnosis not present

## 2022-09-22 DIAGNOSIS — H35342 Macular cyst, hole, or pseudohole, left eye: Secondary | ICD-10-CM | POA: Diagnosis not present

## 2022-09-22 DIAGNOSIS — H43822 Vitreomacular adhesion, left eye: Secondary | ICD-10-CM | POA: Diagnosis not present

## 2022-09-22 LAB — HM DIABETES EYE EXAM

## 2022-09-22 NOTE — Progress Notes (Signed)
Remote pacemaker transmission.   

## 2022-09-23 DIAGNOSIS — E113522 Type 2 diabetes mellitus with proliferative diabetic retinopathy with traction retinal detachment involving the macula, left eye: Secondary | ICD-10-CM | POA: Diagnosis not present

## 2022-09-23 DIAGNOSIS — E113512 Type 2 diabetes mellitus with proliferative diabetic retinopathy with macular edema, left eye: Secondary | ICD-10-CM | POA: Diagnosis not present

## 2022-09-24 DIAGNOSIS — E113512 Type 2 diabetes mellitus with proliferative diabetic retinopathy with macular edema, left eye: Secondary | ICD-10-CM | POA: Diagnosis not present

## 2022-09-24 DIAGNOSIS — H43822 Vitreomacular adhesion, left eye: Secondary | ICD-10-CM | POA: Diagnosis not present

## 2022-09-24 DIAGNOSIS — Z794 Long term (current) use of insulin: Secondary | ICD-10-CM | POA: Diagnosis not present

## 2022-09-24 DIAGNOSIS — E113511 Type 2 diabetes mellitus with proliferative diabetic retinopathy with macular edema, right eye: Secondary | ICD-10-CM | POA: Diagnosis not present

## 2022-09-24 DIAGNOSIS — H401131 Primary open-angle glaucoma, bilateral, mild stage: Secondary | ICD-10-CM | POA: Diagnosis not present

## 2022-09-24 DIAGNOSIS — Z961 Presence of intraocular lens: Secondary | ICD-10-CM | POA: Diagnosis not present

## 2022-09-24 DIAGNOSIS — H35342 Macular cyst, hole, or pseudohole, left eye: Secondary | ICD-10-CM | POA: Diagnosis not present

## 2022-10-01 DIAGNOSIS — E113511 Type 2 diabetes mellitus with proliferative diabetic retinopathy with macular edema, right eye: Secondary | ICD-10-CM | POA: Diagnosis not present

## 2022-10-13 DIAGNOSIS — E1165 Type 2 diabetes mellitus with hyperglycemia: Secondary | ICD-10-CM | POA: Diagnosis not present

## 2022-10-15 ENCOUNTER — Encounter: Payer: Self-pay | Admitting: Ophthalmology

## 2022-10-15 DIAGNOSIS — E113511 Type 2 diabetes mellitus with proliferative diabetic retinopathy with macular edema, right eye: Secondary | ICD-10-CM | POA: Diagnosis not present

## 2022-10-28 ENCOUNTER — Telehealth: Payer: Self-pay

## 2022-10-28 NOTE — Telephone Encounter (Signed)
Alert received from [employer]:  Device alert for exceed AT/AF, persistent AF/AFL per trends, controlled rates Burden 17%, Eliquis per PA report Route for persistent arrhythmia  Outreach made to Pt.   Left message requesting call back.

## 2022-10-30 NOTE — Telephone Encounter (Signed)
No answer, LMTCB

## 2022-11-02 NOTE — Telephone Encounter (Signed)
Pt returning nurse call

## 2022-11-02 NOTE — Telephone Encounter (Signed)
Attempted to contact. No answer, LMTCB.   Mychart message sent to patient.

## 2022-11-02 NOTE — Telephone Encounter (Signed)
Patient reports of shortness of breath and weakness for over 1 week. States when this started he did some work where he was very hot outside and thought at the time he was dehydrated. States he can feel the AF at time and reports intermittent. Reports staying hydrated well. Advised I will forward to AF clinic to review further. Patient agreeable.

## 2022-11-03 ENCOUNTER — Other Ambulatory Visit: Payer: Self-pay | Admitting: Endocrinology

## 2022-11-03 DIAGNOSIS — E1165 Type 2 diabetes mellitus with hyperglycemia: Secondary | ICD-10-CM

## 2022-11-03 DIAGNOSIS — E113413 Type 2 diabetes mellitus with severe nonproliferative diabetic retinopathy with macular edema, bilateral: Secondary | ICD-10-CM | POA: Diagnosis not present

## 2022-11-03 DIAGNOSIS — H401121 Primary open-angle glaucoma, left eye, mild stage: Secondary | ICD-10-CM | POA: Diagnosis not present

## 2022-11-03 DIAGNOSIS — Z961 Presence of intraocular lens: Secondary | ICD-10-CM | POA: Diagnosis not present

## 2022-11-06 ENCOUNTER — Ambulatory Visit (HOSPITAL_COMMUNITY)
Admission: RE | Admit: 2022-11-06 | Discharge: 2022-11-06 | Disposition: A | Discharge status: Home / Self Care | Payer: Medicare HMO | Source: Ambulatory Visit | Attending: Physician Assistant | Admitting: Physician Assistant

## 2022-11-06 VITALS — BP 156/62 | HR 104 | Ht 64.0 in | Wt 196.0 lb

## 2022-11-06 DIAGNOSIS — E119 Type 2 diabetes mellitus without complications: Secondary | ICD-10-CM | POA: Diagnosis not present

## 2022-11-06 DIAGNOSIS — Z7901 Long term (current) use of anticoagulants: Secondary | ICD-10-CM | POA: Diagnosis not present

## 2022-11-06 DIAGNOSIS — I4819 Other persistent atrial fibrillation: Secondary | ICD-10-CM | POA: Diagnosis not present

## 2022-11-06 DIAGNOSIS — Z951 Presence of aortocoronary bypass graft: Secondary | ICD-10-CM | POA: Insufficient documentation

## 2022-11-06 DIAGNOSIS — I4892 Unspecified atrial flutter: Secondary | ICD-10-CM | POA: Diagnosis not present

## 2022-11-06 DIAGNOSIS — I4510 Unspecified right bundle-branch block: Secondary | ICD-10-CM | POA: Insufficient documentation

## 2022-11-06 DIAGNOSIS — I340 Nonrheumatic mitral (valve) insufficiency: Secondary | ICD-10-CM | POA: Diagnosis not present

## 2022-11-06 DIAGNOSIS — Z952 Presence of prosthetic heart valve: Secondary | ICD-10-CM | POA: Insufficient documentation

## 2022-11-06 DIAGNOSIS — Z79899 Other long term (current) drug therapy: Secondary | ICD-10-CM | POA: Diagnosis not present

## 2022-11-06 DIAGNOSIS — I2510 Atherosclerotic heart disease of native coronary artery without angina pectoris: Secondary | ICD-10-CM | POA: Insufficient documentation

## 2022-11-06 DIAGNOSIS — Z950 Presence of cardiac pacemaker: Secondary | ICD-10-CM | POA: Diagnosis not present

## 2022-11-06 DIAGNOSIS — E785 Hyperlipidemia, unspecified: Secondary | ICD-10-CM | POA: Insufficient documentation

## 2022-11-06 DIAGNOSIS — I5042 Chronic combined systolic (congestive) and diastolic (congestive) heart failure: Secondary | ICD-10-CM | POA: Insufficient documentation

## 2022-11-06 DIAGNOSIS — R600 Localized edema: Secondary | ICD-10-CM | POA: Insufficient documentation

## 2022-11-06 DIAGNOSIS — D6869 Other thrombophilia: Secondary | ICD-10-CM | POA: Diagnosis not present

## 2022-11-06 LAB — BASIC METABOLIC PANEL
Anion gap: 7 (ref 5–15)
BUN: 20 mg/dL (ref 8–23)
CO2: 24 mmol/L (ref 22–32)
Calcium: 8.8 mg/dL — ABNORMAL LOW (ref 8.9–10.3)
Chloride: 107 mmol/L (ref 98–111)
Creatinine, Ser: 1.4 mg/dL — ABNORMAL HIGH (ref 0.61–1.24)
GFR, Estimated: 53 mL/min — ABNORMAL LOW (ref >60)
Glucose, Bld: 174 mg/dL — ABNORMAL HIGH (ref 70–99)
Potassium: 4.6 mmol/L (ref 3.5–5.1)
Sodium: 138 mmol/L (ref 135–145)

## 2022-11-06 LAB — CBC
HCT: 33.3 % — ABNORMAL LOW (ref 39.0–52.0)
Hemoglobin: 10.6 g/dL — ABNORMAL LOW (ref 13.0–17.0)
MCH: 27 pg (ref 26.0–34.0)
MCHC: 31.8 g/dL (ref 30.0–36.0)
MCV: 84.7 fL (ref 80.0–100.0)
Platelets: 187 10*3/uL (ref 150–400)
RBC: 3.93 MIL/uL — ABNORMAL LOW (ref 4.22–5.81)
RDW: 15.2 % (ref 11.5–15.5)
WBC: 6.3 10*3/uL (ref 4.0–10.5)
nRBC: 0 % (ref 0.0–0.2)

## 2022-11-06 NOTE — Progress Notes (Signed)
Primary Care Physician: [name], MD Primary Cardiologist: Will Jorja Loa, MD Electrophysiologist: Regan Lemming, MD  Referring Physician: Device clinic/Dr [facility]    Matthew [name] is a 77 y.o. male with a history of CAD s/p CABG 2011, DM, HLD, aortic stenosis s/p TAVR, chronic systolic and diastolic CHF, atrial flutter, atrial fibrillation who presents for follow up in the [facility].  The patient was initially diagnosed with atrial fibrillation and flutter in 2015. He has had several cardioversions and underwent flutter ablation in 2016 and PVI ablation in 2018. Previously on dofetilide but this was discontinued due to QT prolongation. Patient is on Eliquis for a CHADS2VASC score of 4. 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   On follow up today, the device clinic received an alert for an ongoing afib episode starting 10/18/22. He reports that he was outside working in the hot weather when his episode started. He has felt more fatigued and has noticed a 4 lbs weight gain since that time. No bleeding issues on anticoagulation.   Today, he denies symptoms of palpitations, chest pain, orthopnea, PND, dizziness, presyncope, syncope, snoring, daytime somnolence, bleeding, or neurologic sequela. The patient is tolerating medications without difficulties and is otherwise without complaint today.    Atrial Fibrillation Risk Factors:  he does not have symptoms or diagnosis of sleep apnea. he does not have a history of rheumatic fever.   Atrial Fibrillation Management history:  Previous antiarrhythmic drugs: dofetilide (QT prolonged), amiodarone  Previous cardioversions: 2015, 2017 x 2, 07/02/22 Previous ablations: flutter 2016, PVI 2018 Anticoagulation history: Eliquis  ROS- All systems are reviewed and negative except as per the HPI above.   Physical Exam: BP (!) 156/62    Pulse (!) 104   Ht 5\' 4"  (1.626 m)   Wt 88.9 kg   BMI 33.64 kg/m   GEN: Well nourished, well developed in no acute distress NECK: No JVD; No carotid bruits CARDIAC: Irregularly irregular rate and rhythm, no murmurs, rubs, gallops RESPIRATORY:  Clear to auscultation without rales, wheezing or rhonchi  ABDOMEN: Soft, non-tender, non-distended EXTREMITIES:  1+ bilateral edema, No deformity   Wt Readings from Last 3 Encounters:  11/06/22 88.9 kg  09/14/22 88 kg  09/04/22 87.1 kg     EKG today demonstrates  Afib, RBBB Vent. rate 104 BPM PR interval * ms QRS duration 144 ms QT/QTcB 302/397 ms  Echo 07/03/22 demonstrated  1. Left ventricular ejection fraction, by estimation, is 40 to 45%. The  left ventricle has mildly decreased function. The left ventricle  demonstrates regional wall motion abnormalities (see scoring  diagram/findings for description). Inferior/inferolateral hypokinesis. Left ventricular diastolic parameters are consistent with Grade [name] diastolic dysfunction (restrictive). Elevated left atrial pressure.   2. Right ventricular systolic function was not well visualized. The right  ventricular size is not well visualized.   3. Right atrial size was mildly dilated.   4. The mitral valve is degenerative. Mild mitral valve regurgitation.  Mild to moderate mitral stenosis. The mean mitral valve gradient is 5.0  mmHg with average heart rate of 70 bpm. MVA 1.1 cm^2 by continuity  equation. Severe mitral annular calcification.   5. The aortic valve has been repaired/replaced. Aortic valve  regurgitation is not visualized. There is a 29 mm Edwards Sapien  prosthetic (TAVR) valve present in the aortic position. Echo findings are  consistent with normal structure and function of the  aortic valve prosthesis. Aortic valve mean gradient measures 5.0 mmHg.   6. The inferior vena cava is normal in size with greater than 50%  respiratory variability, suggesting right atrial  pressure of 3 mmHg.     CHA2DS2-VASc Score = 4  The patient's score is based upon: CHF History: 1 HTN History: 0 Diabetes History: 1 Stroke History: 0 Vascular Disease History: 1 Age Score: 1 Gender Score: 0       ASSESSMENT AND PLAN: Persistent Atrial Fibrillation (ICD10:  I48.19) The patient's CHA2DS2-VASc score is 4, indicating a 4.8% annual risk of stroke.   S/p flutter ablation 2016 and PVI ablation 2018 Back in persistent afib. We discussed rhythm control options today. Will plan for DCCV. Check bmet/cbc today. Long term, he does not have many AAD options. Would avoid Multaq with CHF and class IC with CAD. Previously failed dofetilide with prolonged QT in 2017 and did not tolerate amiodarone. Question if he would be a candidate for another ablation. If he has quick return of afib will need a visit with Dr Elberta Fortis to discuss.  Continue Eliquis 5 mg BID Continue Toprol 50 mg BID  Secondary Hypercoagulable State (ICD10:  D68.69) The patient is at significant risk for stroke/thromboembolism based upon his CHA2DS2-VASc Score of 4.  Continue Apixaban (Eliquis).   CAD S/p CABG 2011 No anginal symptoms  Valvular heart disease Aortic stenosis s/p TAVR Mild MR, mild to moderate MS Followed by Dr Clifton James  Chronic systolic and diastolic CHF EF 43-32% Patient reports 4 lbs weight gain and some increased lower extremity edema. Will have him take his Lasix 40 mg daily for the next 2-3 days.   Intermittent CHB S/p PPM, followed by Dr Elberta Fortis and the device clinic   Follow up in the AF clinic post DCCV.    Jorja Loa PA-C [facility] [address] [place], [place] [address] [phone]

## 2022-11-06 NOTE — H&P (View-Only) (Signed)
 Primary Care Physician: [name], MD Primary Cardiologist: Will Jorja Loa, MD Electrophysiologist: Regan Lemming, MD  Referring Physician: Device clinic/Dr [facility]    Matthew [name] is a 77 y.o. male with a history of CAD s/p CABG 2011, DM, HLD, aortic stenosis s/p TAVR, chronic systolic and diastolic CHF, atrial flutter, atrial fibrillation who presents for follow up in the [facility].  The patient was initially diagnosed with atrial fibrillation and flutter in 2015. He has had several cardioversions and underwent flutter ablation in 2016 and PVI ablation in 2018. Previously on dofetilide but this was discontinued due to QT prolongation. Patient is on Eliquis for a CHADS2VASC score of 4. 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   On follow up today, the device clinic received an alert for an ongoing afib episode starting 10/18/22. He reports that he was outside working in the hot weather when his episode started. He has felt more fatigued and has noticed a 4 lbs weight gain since that time. No bleeding issues on anticoagulation.   Today, he denies symptoms of palpitations, chest pain, orthopnea, PND, dizziness, presyncope, syncope, snoring, daytime somnolence, bleeding, or neurologic sequela. The patient is tolerating medications without difficulties and is otherwise without complaint today.    Atrial Fibrillation Risk Factors:  he does not have symptoms or diagnosis of sleep apnea. he does not have a history of rheumatic fever.   Atrial Fibrillation Management history:  Previous antiarrhythmic drugs: dofetilide (QT prolonged), amiodarone  Previous cardioversions: 2015, 2017 x 2, 07/02/22 Previous ablations: flutter 2016, PVI 2018 Anticoagulation history: Eliquis  ROS- All systems are reviewed and negative except as per the HPI above.   Physical Exam: BP (!) 156/62    Pulse (!) 104   Ht 5\' 4"  (1.626 m)   Wt 88.9 kg   BMI 33.64 kg/m   GEN: Well nourished, well developed in no acute distress NECK: No JVD; No carotid bruits CARDIAC: Irregularly irregular rate and rhythm, no murmurs, rubs, gallops RESPIRATORY:  Clear to auscultation without rales, wheezing or rhonchi  ABDOMEN: Soft, non-tender, non-distended EXTREMITIES:  1+ bilateral edema, No deformity   Wt Readings from Last 3 Encounters:  11/06/22 88.9 kg  09/14/22 88 kg  09/04/22 87.1 kg     EKG today demonstrates  Afib, RBBB Vent. rate 104 BPM PR interval * ms QRS duration 144 ms QT/QTcB 302/397 ms  Echo 07/03/22 demonstrated  1. Left ventricular ejection fraction, by estimation, is 40 to 45%. The  left ventricle has mildly decreased function. The left ventricle  demonstrates regional wall motion abnormalities (see scoring  diagram/findings for description). Inferior/inferolateral hypokinesis. Left ventricular diastolic parameters are consistent with Grade [name] diastolic dysfunction (restrictive). Elevated left atrial pressure.   2. Right ventricular systolic function was not well visualized. The right  ventricular size is not well visualized.   3. Right atrial size was mildly dilated.   4. The mitral valve is degenerative. Mild mitral valve regurgitation.  Mild to moderate mitral stenosis. The mean mitral valve gradient is 5.0  mmHg with average heart rate of 70 bpm. MVA 1.1 cm^2 by continuity  equation. Severe mitral annular calcification.   5. The aortic valve has been repaired/replaced. Aortic valve  regurgitation is not visualized. There is a 29 mm Edwards Sapien  prosthetic (TAVR) valve present in the aortic position. Echo findings are  consistent with normal structure and function of the  aortic valve prosthesis. Aortic valve mean gradient measures 5.0 mmHg.   6. The inferior vena cava is normal in size with greater than 50%  respiratory variability, suggesting right atrial  pressure of 3 mmHg.     CHA2DS2-VASc Score = 4  The patient's score is based upon: CHF History: 1 HTN History: 0 Diabetes History: 1 Stroke History: 0 Vascular Disease History: 1 Age Score: 1 Gender Score: 0       ASSESSMENT AND PLAN: Persistent Atrial Fibrillation (ICD10:  I48.19) The patient's CHA2DS2-VASc score is 4, indicating a 4.8% annual risk of stroke.   S/p flutter ablation 2016 and PVI ablation 2018 Back in persistent afib. We discussed rhythm control options today. Will plan for DCCV. Check bmet/cbc today. Long term, he does not have many AAD options. Would avoid Multaq with CHF and class IC with CAD. Previously failed dofetilide with prolonged QT in 2017 and did not tolerate amiodarone. Question if he would be a candidate for another ablation. If he has quick return of afib will need a visit with Dr Elberta Fortis to discuss.  Continue Eliquis 5 mg BID Continue Toprol 50 mg BID  Secondary Hypercoagulable State (ICD10:  D68.69) The patient is at significant risk for stroke/thromboembolism based upon his CHA2DS2-VASc Score of 4.  Continue Apixaban (Eliquis).   CAD S/p CABG 2011 No anginal symptoms  Valvular heart disease Aortic stenosis s/p TAVR Mild MR, mild to moderate MS Followed by Dr Clifton James  Chronic systolic and diastolic CHF EF 43-32% Patient reports 4 lbs weight gain and some increased lower extremity edema. Will have him take his Lasix 40 mg daily for the next 2-3 days.   Intermittent CHB S/p PPM, followed by Dr Elberta Fortis and the device clinic   Follow up in the AF clinic post DCCV.    Jorja Loa PA-C Afib Clinic [facility] [address] [place], [place] [address] [phone]

## 2022-11-06 NOTE — Patient Instructions (Signed)
Cardioversion scheduled for: Tuesday, July 16th   - Arrive at the [facility] and go to admitting at [facility] not eat or drink anything after midnight the night prior to your procedure.   - Take all your morning medication (except diabetic medications) with a sip of water prior to arrival.  - You will not be able to drive home after your procedure.    - Do NOT miss any doses of your blood thinner - if you should miss a dose please notify our office immediately.   - If you feel as if you go back into normal rhythm prior to scheduled cardioversion, please notify our office immediately.   If your procedure is canceled in the cardioversion suite you will be charged a cancellation fee.

## 2022-11-09 NOTE — Pre-Procedure Instructions (Signed)
Voice message left for patient regarding procedure tomorrow. Instructed to arrive at 0745; not to eat nor drink after MN. Patient will need a ride home and have a responsible adult to stay with him for 24 hours after the procedure; no missed doses of Eliquis, message left to take the morning of procedure with a sip of water; to take BP meds in the morning with a sip of water.

## 2022-11-10 ENCOUNTER — Encounter (HOSPITAL_COMMUNITY)
Admission: RE | Disposition: A | Discharge status: Home / Self Care | Payer: Self-pay | Source: Home / Self Care | Attending: Cardiovascular Disease

## 2022-11-10 ENCOUNTER — Ambulatory Visit (HOSPITAL_COMMUNITY): Payer: Medicare HMO | Admitting: Anesthesiology

## 2022-11-10 ENCOUNTER — Ambulatory Visit (HOSPITAL_COMMUNITY)
Admission: RE | Admit: 2022-11-10 | Discharge: 2022-11-10 | Disposition: A | Discharge status: Home / Self Care | Payer: Medicare HMO | Attending: Cardiovascular Disease | Admitting: Cardiovascular Disease

## 2022-11-10 ENCOUNTER — Ambulatory Visit (HOSPITAL_BASED_OUTPATIENT_CLINIC_OR_DEPARTMENT_OTHER): Payer: Medicare HMO | Admitting: Anesthesiology

## 2022-11-10 ENCOUNTER — Other Ambulatory Visit: Payer: Self-pay

## 2022-11-10 ENCOUNTER — Encounter (HOSPITAL_COMMUNITY): Payer: Self-pay | Admitting: Cardiovascular Disease

## 2022-11-10 DIAGNOSIS — Z951 Presence of aortocoronary bypass graft: Secondary | ICD-10-CM | POA: Diagnosis not present

## 2022-11-10 DIAGNOSIS — Z950 Presence of cardiac pacemaker: Secondary | ICD-10-CM | POA: Insufficient documentation

## 2022-11-10 DIAGNOSIS — I2510 Atherosclerotic heart disease of native coronary artery without angina pectoris: Secondary | ICD-10-CM | POA: Insufficient documentation

## 2022-11-10 DIAGNOSIS — D6869 Other thrombophilia: Secondary | ICD-10-CM | POA: Diagnosis not present

## 2022-11-10 DIAGNOSIS — E119 Type 2 diabetes mellitus without complications: Secondary | ICD-10-CM | POA: Diagnosis not present

## 2022-11-10 DIAGNOSIS — Z952 Presence of prosthetic heart valve: Secondary | ICD-10-CM | POA: Diagnosis not present

## 2022-11-10 DIAGNOSIS — Z7901 Long term (current) use of anticoagulants: Secondary | ICD-10-CM | POA: Insufficient documentation

## 2022-11-10 DIAGNOSIS — E785 Hyperlipidemia, unspecified: Secondary | ICD-10-CM | POA: Diagnosis not present

## 2022-11-10 DIAGNOSIS — I495 Sick sinus syndrome: Secondary | ICD-10-CM | POA: Diagnosis not present

## 2022-11-10 DIAGNOSIS — I4891 Unspecified atrial fibrillation: Secondary | ICD-10-CM | POA: Diagnosis not present

## 2022-11-10 DIAGNOSIS — I509 Heart failure, unspecified: Secondary | ICD-10-CM

## 2022-11-10 DIAGNOSIS — I4819 Other persistent atrial fibrillation: Secondary | ICD-10-CM

## 2022-11-10 DIAGNOSIS — M1990 Unspecified osteoarthritis, unspecified site: Secondary | ICD-10-CM | POA: Diagnosis not present

## 2022-11-10 DIAGNOSIS — Z87891 Personal history of nicotine dependence: Secondary | ICD-10-CM

## 2022-11-10 DIAGNOSIS — Z79899 Other long term (current) drug therapy: Secondary | ICD-10-CM | POA: Insufficient documentation

## 2022-11-10 DIAGNOSIS — I5042 Chronic combined systolic (congestive) and diastolic (congestive) heart failure: Secondary | ICD-10-CM | POA: Diagnosis not present

## 2022-11-10 DIAGNOSIS — I442 Atrioventricular block, complete: Secondary | ICD-10-CM | POA: Diagnosis not present

## 2022-11-10 DIAGNOSIS — I700 Atherosclerosis of aorta: Secondary | ICD-10-CM | POA: Diagnosis not present

## 2022-11-10 HISTORY — PX: CARDIOVERSION: SHX1299

## 2022-11-10 LAB — GLUCOSE, CAPILLARY: Glucose-Capillary: 206 mg/dL — ABNORMAL HIGH (ref 70–99)

## 2022-11-10 SURGERY — CARDIOVERSION
Anesthesia: General

## 2022-11-10 MED ORDER — SODIUM CHLORIDE 0.9 % IV SOLN: INTRAVENOUS | Status: DC

## 2022-11-10 MED ORDER — LIDOCAINE 2% (20 MG/ML) 5 ML SYRINGE
INTRAMUSCULAR | Status: DC | PRN
  Administered 2022-11-10: 70 mg via INTRAVENOUS

## 2022-11-10 MED ORDER — PROPOFOL 10 MG/ML IV BOLUS
INTRAVENOUS | Status: DC | PRN
  Administered 2022-11-10: 60 mg via INTRAVENOUS

## 2022-11-10 MED ORDER — APIXABAN 5 MG PO TABS
5.0000 mg | ORAL_TABLET | Freq: Once | ORAL | Status: DC
  Filled 2022-11-10: qty 1

## 2022-11-10 SURGICAL SUPPLY — 1 items: ELECT DEFIB PAD ADLT CADENCE (PAD) ×1 IMPLANT

## 2022-11-10 NOTE — Op Note (Signed)
Procedure: Electrical Cardioversion Indications:  Atrial Fibrillation  Procedure Details:  Consent: Risks of procedure as well as the alternatives and risks of each were explained to the (patient/caregiver).  Consent for procedure obtained.  Time Out: Verified patient identification, verified procedure, site/side was marked, verified correct patient position, special equipment/implants available, medications/allergies/relevent history reviewed, required imaging and test results available.  Performed  Patient placed on cardiac monitor, pulse oximetry, supplemental oxygen as necessary.  Sedation given:  propofol IV, Dr. Renold Don Pacer pads placed anterior and posterior chest.  Cardioverted 1 time(s).  Cardioversion with synchronized biphasic 120J shock.  Evaluation: Findings: Post procedure EKG shows: NSR Complications: None Patient did tolerate procedure well.  Normal pacemaker/lead parameters after the cardioversion shock  Time Spent Directly with the Patient:  30 minutes   Matthew [name] 11/10/2022, 9:18 AM

## 2022-11-10 NOTE — Transfer of Care (Signed)
Immediate Anesthesia Transfer of Care Note  Patient: Matthew [name]  Procedure(s) Performed: CARDIOVERSION  Patient Location: Cath Lab  Anesthesia Type:General  Level of Consciousness: drowsy and patient cooperative  Airway & Oxygen Therapy: Patient Spontanous Breathing and Patient connected to nasal cannula oxygen  Post-op Assessment: Report given to RN and Post -op Vital signs reviewed and stable  Post vital signs: Reviewed and stable  Last Vitals:  Vitals Value Taken Time  BP    Temp    Pulse 68 11/10/22 0917  Resp 15 11/10/22 0917  SpO2 99 % 11/10/22 0917  Vitals shown include unfiled device data.  Last Pain:  Vitals:   11/10/22 0849  TempSrc: Temporal  PainSc: 0-No pain         Complications: No notable events documented.

## 2022-11-10 NOTE — Anesthesia Preprocedure Evaluation (Addendum)
Anesthesia Evaluation  Patient identified by MRN, date of birth, ID band Patient awake    Reviewed: Allergy & Precautions, H&P , NPO status , Patient's Chart, lab work & pertinent test results  Airway Mallampati: II  TM Distance: >3 FB Neck ROM: Full    Dental  (+) Dental Advisory Given   Pulmonary pneumonia, former smoker   Pulmonary exam normal breath sounds clear to auscultation       Cardiovascular + CAD, + Past MI, + CABG and +CHF  + dysrhythmias Atrial Fibrillation + Valvular Problems/Murmurs (Mild-mod MS) MR  Rhythm:Regular Rate:Normal + Systolic murmurs Echo 06/2022  1. Left ventricular ejection fraction, by estimation, is 40 to 45%. The left ventricle has mildly decreased function. The left ventricle demonstrates regional wall motion abnormalities (see scoring diagram/findings for description). Inferior/inferolateral hypokinesis. Left ventricular diastolic parameters are consistent with Grade III diastolic dysfunction (restrictive). Elevated left atrial pressure.   2. Right ventricular systolic function was not well visualized. The right ventricular size is not well visualized.   3. Right atrial size was mildly dilated.   4. The mitral valve is degenerative. Mild mitral valve regurgitation. Mild to moderate mitral stenosis. The mean mitral valve gradient is 5.0 mmHg with average heart rate of 70 bpm. MVA 1.1 cm^2 by continuity equation. Severe mitral annular calcification.   5. The aortic valve has been repaired/replaced. Aortic valve regurgitation is not visualized. There is a 29 mm Edwards Sapien prosthetic (TAVR) valve present in the aortic position. Echo findings are consistent with normal structure and function of the  aortic valve prosthesis. Aortic valve mean gradient measures 5.0 mmHg.   6. The inferior vena cava is normal in size with greater than 50% respiratory variability, suggesting right atrial pressure of 3 mmHg.     ECHO 2/24 1. Left ventricular ejection fraction, by estimation, is 50 to 55%. The  left ventricle has low normal function. The left ventricle has no regional  wall motion abnormalities. There is mild concentric left ventricular  hypertrophy. Left ventricular  diastolic function could not be evaluated.   2. Right ventricular systolic function is normal. The right ventricular  size is normal. Tricuspid regurgitation signal is inadequate for assessing  PA pressure.   3. Left atrial size was severely dilated.   4. Right atrial size was mildly dilated.   5. The mitral valve is degenerative. Trivial mitral valve regurgitation.  Mild mitral stenosis. The mean mitral valve gradient is 7.0 mmHg with  average heart rate of 99 bpm. Severe mitral annular calcification.   6. The aortic valve has been repaired/replaced. Aortic valve  regurgitation is not visualized. There is a 29 mm Sapien prosthetic (TAVR)  valve present in the aortic position. Procedure Date: 06/02/2022. Aortic  valve mean gradient measures 9.0 mmHg. Aortic  valve Vmax measures 1.92 m/s. Aortic valve acceleration time measures 74  msec.     Neuro/Psych  Headaches  negative psych ROS   GI/Hepatic negative GI ROS, Neg liver ROS,,,  Endo/Other  diabetes, Poorly Controlled, Type 2    Renal/GU negative Renal ROS     Musculoskeletal  (+) Arthritis ,    Abdominal   Peds  Hematology  (+) Blood dyscrasia, anemia   Anesthesia Other Findings   Reproductive/Obstetrics                             Anesthesia Physical Anesthesia Plan  ASA: 3  Anesthesia Plan: General   Post-op Pain  Management: Minimal or no pain anticipated   Induction: Intravenous  PONV Risk Score and Plan: 1 and Propofol infusion, Treatment may vary due to age or medical condition and TIVA  Airway Management Planned: Simple Face Mask, Natural Airway and Mask  Additional Equipment: None  Intra-op Plan:    Post-operative Plan:   Informed Consent: I have reviewed the patients History and Physical, chart, labs and discussed the procedure including the risks, benefits and alternatives for the proposed anesthesia with the patient or authorized representative who has indicated his/her understanding and acceptance.     Dental advisory given  Plan Discussed with: CRNA  Anesthesia Plan Comments:        Anesthesia Quick Evaluation

## 2022-11-10 NOTE — Interval H&P Note (Signed)
History and Physical Interval Note:  11/10/2022 8:46 AM  Matthew [name]  has presented today for surgery, with the diagnosis of AFIB.  The various methods of treatment have been discussed with the patient and family. After consideration of risks, benefits and other options for treatment, the patient has consented to  Procedure(s): CARDIOVERSION (N/A) as a surgical intervention.  The patient's history has been reviewed, patient examined, no change in status, stable for surgery.  I have reviewed the patient's chart and labs.  Questions were answered to the patient's satisfaction.     Lenward Able

## 2022-11-11 ENCOUNTER — Encounter (HOSPITAL_COMMUNITY): Payer: Self-pay | Admitting: Cardiovascular Disease

## 2022-11-11 NOTE — Anesthesia Postprocedure Evaluation (Signed)
Anesthesia Post Note  Patient: Matthew [name]  Procedure(s) Performed: CARDIOVERSION     Patient location during evaluation: Cath Lab Anesthesia Type: General Level of consciousness: sedated and patient cooperative Pain management: pain level controlled Vital Signs Assessment: post-procedure vital signs reviewed and stable Respiratory status: spontaneous breathing Cardiovascular status: stable Anesthetic complications: no   No notable events documented.  Last Vitals:  Vitals:   11/10/22 0920 11/10/22 0926  BP: (!) 115/53 (!) 110/55  Pulse: 65 62  Resp: (!) 21 17  Temp:    SpO2: 99% 100%    Last Pain:  Vitals:   11/10/22 0925  TempSrc:   PainSc: 0-No pain                 [name]

## 2022-11-15 ENCOUNTER — Other Ambulatory Visit: Payer: Self-pay | Admitting: Endocrinology

## 2022-11-19 ENCOUNTER — Ambulatory Visit (HOSPITAL_COMMUNITY)
Admission: RE | Admit: 2022-11-19 | Discharge: 2022-11-19 | Disposition: A | Discharge status: Home / Self Care | Payer: Medicare HMO | Source: Ambulatory Visit | Attending: Orthopedic Surgery | Admitting: Orthopedic Surgery

## 2022-11-19 DIAGNOSIS — M47816 Spondylosis without myelopathy or radiculopathy, lumbar region: Secondary | ICD-10-CM | POA: Diagnosis not present

## 2022-11-19 DIAGNOSIS — M5126 Other intervertebral disc displacement, lumbar region: Secondary | ICD-10-CM | POA: Diagnosis not present

## 2022-11-19 DIAGNOSIS — M48061 Spinal stenosis, lumbar region without neurogenic claudication: Secondary | ICD-10-CM | POA: Diagnosis not present

## 2022-11-19 DIAGNOSIS — M5459 Other low back pain: Secondary | ICD-10-CM | POA: Diagnosis not present

## 2022-11-19 DIAGNOSIS — H35342 Macular cyst, hole, or pseudohole, left eye: Secondary | ICD-10-CM | POA: Diagnosis not present

## 2022-11-19 DIAGNOSIS — E113511 Type 2 diabetes mellitus with proliferative diabetic retinopathy with macular edema, right eye: Secondary | ICD-10-CM | POA: Diagnosis not present

## 2022-11-19 DIAGNOSIS — H35372 Puckering of macula, left eye: Secondary | ICD-10-CM | POA: Diagnosis not present

## 2022-11-19 DIAGNOSIS — M5127 Other intervertebral disc displacement, lumbosacral region: Secondary | ICD-10-CM | POA: Diagnosis not present

## 2022-11-19 LAB — HM DIABETES EYE EXAM

## 2022-11-25 DIAGNOSIS — E113522 Type 2 diabetes mellitus with proliferative diabetic retinopathy with traction retinal detachment involving the macula, left eye: Secondary | ICD-10-CM | POA: Diagnosis not present

## 2022-11-25 DIAGNOSIS — E103592 Type 1 diabetes mellitus with proliferative diabetic retinopathy without macular edema, left eye: Secondary | ICD-10-CM | POA: Diagnosis not present

## 2022-11-25 DIAGNOSIS — H35372 Puckering of macula, left eye: Secondary | ICD-10-CM | POA: Diagnosis not present

## 2022-11-26 ENCOUNTER — Encounter (HOSPITAL_COMMUNITY): Payer: Self-pay | Admitting: Physician Assistant

## 2022-11-26 ENCOUNTER — Ambulatory Visit (HOSPITAL_COMMUNITY)
Admission: RE | Admit: 2022-11-26 | Discharge: 2022-11-26 | Disposition: A | Discharge status: Home / Self Care | Payer: Medicare HMO | Source: Ambulatory Visit | Attending: Physician Assistant | Admitting: Physician Assistant

## 2022-11-26 VITALS — BP 128/64 | HR 60 | Ht 64.0 in | Wt 192.0 lb

## 2022-11-26 DIAGNOSIS — I4510 Unspecified right bundle-branch block: Secondary | ICD-10-CM

## 2022-11-26 DIAGNOSIS — I2510 Atherosclerotic heart disease of native coronary artery without angina pectoris: Secondary | ICD-10-CM | POA: Diagnosis not present

## 2022-11-26 DIAGNOSIS — I442 Atrioventricular block, complete: Secondary | ICD-10-CM | POA: Diagnosis not present

## 2022-11-26 DIAGNOSIS — D6869 Other thrombophilia: Secondary | ICD-10-CM | POA: Insufficient documentation

## 2022-11-26 DIAGNOSIS — Z952 Presence of prosthetic heart valve: Secondary | ICD-10-CM | POA: Diagnosis not present

## 2022-11-26 DIAGNOSIS — I5042 Chronic combined systolic (congestive) and diastolic (congestive) heart failure: Secondary | ICD-10-CM | POA: Diagnosis not present

## 2022-11-26 DIAGNOSIS — I4819 Other persistent atrial fibrillation: Secondary | ICD-10-CM | POA: Insufficient documentation

## 2022-11-26 DIAGNOSIS — Z951 Presence of aortocoronary bypass graft: Secondary | ICD-10-CM | POA: Diagnosis not present

## 2022-11-26 NOTE — Progress Notes (Signed)
Primary Care Physician: [name], MD Primary Cardiologist: Will Jorja Loa, MD Electrophysiologist: Regan Lemming, MD  Referring Physician: Device clinic/Dr [facility]    Matthew [name] is a 77 y.o. male with a history of CAD s/p CABG 2011, DM, HLD, aortic stenosis s/p TAVR, chronic systolic and diastolic CHF, atrial flutter, atrial fibrillation who presents for follow up in the [facility].  The patient was initially diagnosed with atrial fibrillation and flutter in 2015. He has had several cardioversions and underwent flutter ablation in 2016 and PVI ablation in 2018. Previously on dofetilide but this was discontinued due to QT prolongation. Patient is on Eliquis for a CHADS2VASC score of 4. 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   The device clinic received an alert for an ongoing afib episode starting 10/18/22. He underwent DCCV on 11/10/22.  On follow up today, patient reports that he has done well from a cardiac standpoint since his cardioversion. He remains in SR today. 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   Today, he denies symptoms of palpitations, chest pain, orthopnea, PND, dizziness, presyncope, syncope, snoring, daytime somnolence, bleeding, or neurologic sequela. The patient is tolerating medications without difficulties and is otherwise without complaint today.    Atrial Fibrillation Risk Factors:  he does not have symptoms or diagnosis of sleep apnea. he does not have a history of rheumatic fever.   Atrial Fibrillation Management history:  Previous antiarrhythmic drugs: dofetilide (QT prolonged), amiodarone  Previous cardioversions: 2015, 2017 x 2, 07/02/22, 11/10/22 Previous ablations: flutter 2016, PVI 2018 Anticoagulation history: Eliquis  ROS- All systems are reviewed and negative  except as per the HPI above.   Physical Exam: BP 128/64   Pulse 60   Ht 5\' 4"  (1.626 m)   Wt 87.1 kg   BMI 32.96 kg/m   GEN: Well nourished, well developed in no acute distress, eye patch over left eye NECK: No JVD; No carotid bruits CARDIAC: Regular rate and rhythm, no murmurs, rubs, gallops RESPIRATORY:  Clear to auscultation without rales, wheezing or rhonchi  ABDOMEN: Soft, non-tender, non-distended EXTREMITIES:  No edema; No deformity    Wt Readings from Last 3 Encounters:  11/26/22 87.1 kg  11/10/22 87.5 kg  11/06/22 88.9 kg     EKG today demonstrates  A paced rhythm, RBBB Vent. rate 60 BPM PR interval 254 ms QRS duration 152 ms QT/QTcB 488/488 ms  Echo 07/03/22 demonstrated  1. Left ventricular ejection fraction, by estimation, is 40 to 45%. The  left ventricle has mildly decreased function. The left ventricle  demonstrates regional wall motion abnormalities (see scoring  diagram/findings for description). Inferior/inferolateral hypokinesis. Left ventricular diastolic parameters are consistent with Grade [name] diastolic dysfunction (restrictive). Elevated left atrial pressure.   2. Right ventricular systolic function was not well visualized. The right  ventricular size is not well visualized.   3. Right atrial size was mildly dilated.   4. The mitral valve is degenerative. Mild mitral valve regurgitation.  Mild to moderate mitral stenosis. The mean mitral valve gradient is 5.0  mmHg with average heart rate of 70 bpm. MVA 1.1 cm^2 by continuity  equation. Severe mitral annular calcification.   5. The aortic valve has been repaired/replaced. Aortic valve  regurgitation is not visualized. There is a 29 mm Edwards Sapien  prosthetic (TAVR) valve present in  the aortic position. Echo findings are  consistent with normal structure and function of the  aortic valve prosthesis. Aortic valve mean gradient measures 5.0 mmHg.   6. The inferior vena cava is normal in size with  greater than 50%  respiratory variability, suggesting right atrial pressure of 3 mmHg.     CHA2DS2-VASc Score = 4  The patient's score is based upon: CHF History: 1 HTN History: 0 Diabetes History: 1 Stroke History: 0 Vascular Disease History: 1 Age Score: 1 Gender Score: 0       ASSESSMENT AND PLAN: Persistent Atrial Fibrillation (ICD10:  I48.19) The patient's CHA2DS2-VASc score is 4, indicating a 4.8% annual risk of stroke.   S/p flutter ablation 2016 and PVI ablation 2018 S/p DCCV 11/10/22 Patient appears to be maintaining SR. Long term, he does not have many AAD options. Would avoid Multaq with CHF and class IC with CAD. Previously failed dofetilide with prolonged QT in 2017 and did not tolerate amiodarone. Question if he would be a candidate for another ablation. If he has quick return of afib will need a visit with Dr Elberta Fortis to discuss.  Continue Eliquis 5 mg BID Continue Toprol 50 mg BID  Secondary Hypercoagulable State (ICD10:  D68.69) The patient is at significant risk for stroke/thromboembolism based upon his CHA2DS2-VASc Score of 4.  Continue Apixaban (Eliquis).   CAD S/p CABG 2011 No anginal symptoms  Valvular heart disease Aortic stenosis s/p TAVR Mild MR, mild to moderate MS Followed by Dr Clifton James  Chronic systolic and diastolic CHF EF 86-57% Fluid status appears stable today  Intermittent CHB S/p PPM, followed by Dr Elberta Fortis and the device clinic   Follow up with the structural heart team as scheduled. EP APP per recall.    Jorja Loa PA-C Afib Clinic [facility] [address] [place], [place] [address] [phone]

## 2022-12-03 DIAGNOSIS — H35372 Puckering of macula, left eye: Secondary | ICD-10-CM | POA: Diagnosis not present

## 2022-12-03 DIAGNOSIS — Z794 Long term (current) use of insulin: Secondary | ICD-10-CM | POA: Diagnosis not present

## 2022-12-03 DIAGNOSIS — E113523 Type 2 diabetes mellitus with proliferative diabetic retinopathy with traction retinal detachment involving the macula, bilateral: Secondary | ICD-10-CM | POA: Diagnosis not present

## 2022-12-03 DIAGNOSIS — E113512 Type 2 diabetes mellitus with proliferative diabetic retinopathy with macular edema, left eye: Secondary | ICD-10-CM | POA: Diagnosis not present

## 2022-12-03 DIAGNOSIS — H401131 Primary open-angle glaucoma, bilateral, mild stage: Secondary | ICD-10-CM | POA: Diagnosis not present

## 2022-12-11 ENCOUNTER — Ambulatory Visit (INDEPENDENT_AMBULATORY_CARE_PROVIDER_SITE_OTHER): Payer: Medicare HMO

## 2022-12-11 DIAGNOSIS — I442 Atrioventricular block, complete: Secondary | ICD-10-CM

## 2022-12-11 LAB — CUP PACEART REMOTE DEVICE CHECK
Battery Remaining Longevity: 117 mo
Battery Remaining Percentage: 95.5 %
Battery Voltage: 3.02 V
Brady Statistic AP VP Percent: 1 %
Brady Statistic AP VS Percent: 23 %
Brady Statistic AS VP Percent: 1 %
Brady Statistic AS VS Percent: 76 %
Brady Statistic RA Percent Paced: 16 %
Brady Statistic RV Percent Paced: 6.6 %
Date Time Interrogation Session: 20240816062626
Implantable Lead Connection Status: 753985
Implantable Lead Connection Status: 753985
Implantable Lead Implant Date: 20240207
Implantable Lead Implant Date: 20240207
Implantable Lead Location: 753859
Implantable Lead Location: 753860
Implantable Pulse Generator Implant Date: 20240207
Lead Channel Impedance Value: 410 Ohm
Lead Channel Impedance Value: 590 Ohm
Lead Channel Pacing Threshold Amplitude: 0.5 V
Lead Channel Pacing Threshold Amplitude: 0.75 V
Lead Channel Pacing Threshold Pulse Width: 0.5 ms
Lead Channel Pacing Threshold Pulse Width: 0.5 ms
Lead Channel Sensing Intrinsic Amplitude: 12 mV
Lead Channel Sensing Intrinsic Amplitude: 2.6 mV
Lead Channel Setting Pacing Amplitude: 2 V
Lead Channel Setting Pacing Amplitude: 2.5 V
Lead Channel Setting Pacing Pulse Width: 0.5 ms
Lead Channel Setting Sensing Sensitivity: 2 mV
Pulse Gen Model: 2272
Pulse Gen Serial Number: 8154174

## 2022-12-17 ENCOUNTER — Encounter: Payer: Self-pay | Admitting: Cardiology

## 2022-12-17 DIAGNOSIS — E113512 Type 2 diabetes mellitus with proliferative diabetic retinopathy with macular edema, left eye: Secondary | ICD-10-CM | POA: Diagnosis not present

## 2022-12-17 DIAGNOSIS — H35372 Puckering of macula, left eye: Secondary | ICD-10-CM | POA: Diagnosis not present

## 2022-12-17 DIAGNOSIS — E113522 Type 2 diabetes mellitus with proliferative diabetic retinopathy with traction retinal detachment involving the macula, left eye: Secondary | ICD-10-CM | POA: Diagnosis not present

## 2022-12-17 DIAGNOSIS — E113511 Type 2 diabetes mellitus with proliferative diabetic retinopathy with macular edema, right eye: Secondary | ICD-10-CM | POA: Diagnosis not present

## 2022-12-17 DIAGNOSIS — Z961 Presence of intraocular lens: Secondary | ICD-10-CM | POA: Diagnosis not present

## 2022-12-17 DIAGNOSIS — H401131 Primary open-angle glaucoma, bilateral, mild stage: Secondary | ICD-10-CM | POA: Diagnosis not present

## 2022-12-17 DIAGNOSIS — H35352 Cystoid macular degeneration, left eye: Secondary | ICD-10-CM | POA: Diagnosis not present

## 2022-12-17 DIAGNOSIS — Z794 Long term (current) use of insulin: Secondary | ICD-10-CM | POA: Diagnosis not present

## 2022-12-17 DIAGNOSIS — H35342 Macular cyst, hole, or pseudohole, left eye: Secondary | ICD-10-CM | POA: Diagnosis not present

## 2022-12-21 NOTE — Progress Notes (Signed)
Remote pacemaker transmission.   

## 2022-12-29 DIAGNOSIS — M5451 Vertebrogenic low back pain: Secondary | ICD-10-CM | POA: Diagnosis not present

## 2022-12-30 ENCOUNTER — Ambulatory Visit: Payer: Medicare HMO

## 2023-01-04 DIAGNOSIS — E113522 Type 2 diabetes mellitus with proliferative diabetic retinopathy with traction retinal detachment involving the macula, left eye: Secondary | ICD-10-CM | POA: Diagnosis not present

## 2023-01-04 DIAGNOSIS — H401131 Primary open-angle glaucoma, bilateral, mild stage: Secondary | ICD-10-CM | POA: Diagnosis not present

## 2023-01-04 DIAGNOSIS — E113512 Type 2 diabetes mellitus with proliferative diabetic retinopathy with macular edema, left eye: Secondary | ICD-10-CM | POA: Diagnosis not present

## 2023-01-04 DIAGNOSIS — E113511 Type 2 diabetes mellitus with proliferative diabetic retinopathy with macular edema, right eye: Secondary | ICD-10-CM | POA: Diagnosis not present

## 2023-01-04 DIAGNOSIS — Z961 Presence of intraocular lens: Secondary | ICD-10-CM | POA: Diagnosis not present

## 2023-01-04 DIAGNOSIS — H35372 Puckering of macula, left eye: Secondary | ICD-10-CM | POA: Diagnosis not present

## 2023-01-04 DIAGNOSIS — H35352 Cystoid macular degeneration, left eye: Secondary | ICD-10-CM | POA: Diagnosis not present

## 2023-01-04 DIAGNOSIS — H35342 Macular cyst, hole, or pseudohole, left eye: Secondary | ICD-10-CM | POA: Diagnosis not present

## 2023-01-04 DIAGNOSIS — Z794 Long term (current) use of insulin: Secondary | ICD-10-CM | POA: Diagnosis not present

## 2023-01-11 DIAGNOSIS — E1165 Type 2 diabetes mellitus with hyperglycemia: Secondary | ICD-10-CM | POA: Diagnosis not present

## 2023-01-18 ENCOUNTER — Encounter: Payer: Self-pay | Admitting: Cardiology

## 2023-01-19 ENCOUNTER — Other Ambulatory Visit: Payer: Self-pay | Admitting: Hematology and Oncology

## 2023-01-19 ENCOUNTER — Telehealth (HOSPITAL_COMMUNITY): Payer: Self-pay | Admitting: *Deleted

## 2023-01-19 ENCOUNTER — Inpatient Hospital Stay: Payer: Medicare HMO | Attending: Hematology and Oncology

## 2023-01-19 DIAGNOSIS — R768 Other specified abnormal immunological findings in serum: Secondary | ICD-10-CM | POA: Insufficient documentation

## 2023-01-19 DIAGNOSIS — Z87891 Personal history of nicotine dependence: Secondary | ICD-10-CM | POA: Diagnosis not present

## 2023-01-19 DIAGNOSIS — D649 Anemia, unspecified: Secondary | ICD-10-CM

## 2023-01-19 DIAGNOSIS — R5383 Other fatigue: Secondary | ICD-10-CM | POA: Diagnosis not present

## 2023-01-19 DIAGNOSIS — R0602 Shortness of breath: Secondary | ICD-10-CM | POA: Insufficient documentation

## 2023-01-19 DIAGNOSIS — E538 Deficiency of other specified B group vitamins: Secondary | ICD-10-CM | POA: Diagnosis not present

## 2023-01-19 LAB — CMP (CANCER CENTER ONLY)
ALT: 14 U/L (ref 0–44)
AST: 18 U/L (ref 15–41)
Albumin: 3.8 g/dL (ref 3.5–5.0)
Alkaline Phosphatase: 88 U/L (ref 38–126)
Anion gap: 3 — ABNORMAL LOW (ref 5–15)
BUN: 18 mg/dL (ref 8–23)
CO2: 30 mmol/L (ref 22–32)
Calcium: 9.1 mg/dL (ref 8.9–10.3)
Chloride: 106 mmol/L (ref 98–111)
Creatinine: 1.09 mg/dL (ref 0.61–1.24)
GFR, Estimated: >60 mL/min (ref >60)
Glucose, Bld: 111 mg/dL — ABNORMAL HIGH (ref 70–99)
Potassium: 4.3 mmol/L (ref 3.5–5.1)
Sodium: 139 mmol/L (ref 135–145)
Total Bilirubin: 0.6 mg/dL (ref 0.3–1.2)
Total Protein: 6.7 g/dL (ref 6.5–8.1)

## 2023-01-19 LAB — CBC WITH DIFFERENTIAL (CANCER CENTER ONLY)
Abs Immature Granulocytes: 0.02 10*3/uL (ref 0.00–0.07)
Basophils Absolute: 0 10*3/uL (ref 0.0–0.1)
Basophils Relative: 1 %
Eosinophils Absolute: 0.4 10*3/uL (ref 0.0–0.5)
Eosinophils Relative: 6 %
HCT: 36 % — ABNORMAL LOW (ref 39.0–52.0)
Hemoglobin: 11.9 g/dL — ABNORMAL LOW (ref 13.0–17.0)
Immature Granulocytes: 0 %
Lymphocytes Relative: 22 %
Lymphs Abs: 1.4 10*3/uL (ref 0.7–4.0)
MCH: 28.6 pg (ref 26.0–34.0)
MCHC: 33.1 g/dL (ref 30.0–36.0)
MCV: 86.5 fL (ref 80.0–100.0)
Monocytes Absolute: 0.6 10*3/uL (ref 0.1–1.0)
Monocytes Relative: 9 %
Neutro Abs: 3.9 10*3/uL (ref 1.7–7.7)
Neutrophils Relative %: 62 %
Platelet Count: 223 10*3/uL (ref 150–400)
RBC: 4.16 MIL/uL — ABNORMAL LOW (ref 4.22–5.81)
RDW: 14.2 % (ref 11.5–15.5)
WBC Count: 6.3 10*3/uL (ref 4.0–10.5)
nRBC: 0 % (ref 0.0–0.2)

## 2023-01-19 LAB — VITAMIN B12: Vitamin B-12: 349 pg/mL (ref 180–914)

## 2023-01-19 NOTE — Telephone Encounter (Signed)
Patient reminded of upcoming Amyloid appointment.  Matthew [name]

## 2023-01-21 ENCOUNTER — Inpatient Hospital Stay: Payer: Medicare HMO | Admitting: Hematology and Oncology

## 2023-01-21 ENCOUNTER — Other Ambulatory Visit: Payer: Medicare HMO

## 2023-01-21 VITALS — BP 162/56 | HR 83 | Temp 98.0°F | Resp 16 | Ht 64.0 in | Wt 196.0 lb

## 2023-01-21 DIAGNOSIS — R0602 Shortness of breath: Secondary | ICD-10-CM | POA: Diagnosis not present

## 2023-01-21 DIAGNOSIS — R5383 Other fatigue: Secondary | ICD-10-CM | POA: Diagnosis not present

## 2023-01-21 DIAGNOSIS — D649 Anemia, unspecified: Secondary | ICD-10-CM

## 2023-01-21 DIAGNOSIS — R768 Other specified abnormal immunological findings in serum: Secondary | ICD-10-CM | POA: Diagnosis not present

## 2023-01-21 DIAGNOSIS — Z87891 Personal history of nicotine dependence: Secondary | ICD-10-CM | POA: Diagnosis not present

## 2023-01-21 DIAGNOSIS — E538 Deficiency of other specified B group vitamins: Secondary | ICD-10-CM | POA: Diagnosis not present

## 2023-01-21 LAB — METHYLMALONIC ACID, SERUM: Methylmalonic Acid, Quantitative: 146 nmol/L (ref 0–378)

## 2023-01-21 NOTE — Telephone Encounter (Signed)
Pt made aware. He would like to wait until his 6 mo f/u on 10/16 and further discuss testing. Study cancelled.

## 2023-01-21 NOTE — Progress Notes (Signed)
[facility] [phone]   [phone]  PROGRESS NOTE  Patient Care Team: [name], MD as PCP - General (Family Medicine) Regan Lemming, MD as PCP - Electrophysiology (Cardiology) Regan Lemming, MD as PCP - Cardiology (Cardiology) Regan Lemming, MD as Consulting Physician (Cardiology)  Hematological/Oncological History # Normocytic Anemia # Elevated Serum Free Light Chain Ratio  # Vitamin B12 Deficiency  06/02/2022: SPEP showed no M protein, Kappa 38.2, Lambda 19.8, K/L ratio 1.93 07/13/2022: establish care with Dr. Leonides Schanz   Interval History:  Matthew [name] 77 y.o. male with medical history significant for vitamin B12 deficiency who presents for a follow up visit. The patient's last visit was on 07/13/2022 at which time he established care. In the interim since the last visit he has been taking his vitamin B12 pills as prescribed.  On exam today Matthew [name] is accompanied by his wife.  He reports that he began taking a B complex vitamin including vitamin B12 1000 mcg p.o. daily.  He notes it is turning his urine bright yellow.  He notes he has not noticed any difference since starting the medication.  He reports that he does not eat a whole lot of red meat, only about twice per week.  He prefers hamburger.  He reports he does continue to have some shortness of breath and fatigue.  He is not having any overt signs of bleeding such as nosebleeds, gum bleeding, or dark stools, though he has having easy bruising.  He reports that he feels like his "back is screwed" and that that is part of what keeps him from being as active and energetic as he would like to be.  Otherwise he denies any fevers, chills, sweats, nausea, vomiting or diarrhea.  A full 10 point ROS is otherwise negative.  MEDICAL HISTORY:  Past Medical History:  Diagnosis Date   Anemia    Arthritis    "back, left ankle" (11/20/2015)   Cataract    CHF (congestive  heart failure) (HCC)    Chronic lower back pain    Coronary artery disease    a. 05/2009 CABG x 3: LIMA->LAD, VG->OM, VG->PDA; b. Nuc 01/2014: inf-lat scar but no ischemia, EF 57%.   Diabetic retinopathy (HCC)    mild- Dr. [name]   Glaucoma    Hyperlipidemia    a. Intolerant of lipitor and vytorin.   Lyme disease    Migraine    "once or twice" (11/20/2015)   Myocardial infarction [facility])    'saw evidence of it on an EKG done in 2005"   Paroxysmal atrial flutter (HCC)    a. 01/2014 s/p DCCV;  b. CHA2DS2VASc = 3-->Eliquis.   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 Clifton James and Dr. Laneta Simmers   Sacral pain    "right"   Sciatica of right side    Testicular cancer (HCC) 1982   "heavy doses of radiation; it was stage 2"   Type II diabetes mellitus (HCC)    Valvular heart disease    a. 01/2014 Echo: Ef 60-65%, no rwma, mild AI/MS, mod MR, mildly dil LA.    SURGICAL HISTORY: Past Surgical History:  Procedure Laterality Date   ABDOMINAL EXPLORATION SURGERY  ~ 2005   "for hernia, but didn't have one"   ATRIAL FIBRILLATION ABLATION N/A 09/17/2016   Procedure: Atrial Fibrillation Ablation;  Surgeon: Regan Lemming, MD;  Location: [facility] INVASIVE  CV LAB;  Service: Cardiovascular;  Laterality: N/A;   CARDIAC CATHETERIZATION  2011   CARDIOVERSION N/A 02/19/2014   Procedure: CARDIOVERSION;  Surgeon: Lesleigh Noe, MD;  Location: [facility] ENDOSCOPY;  Service: Cardiovascular;  Laterality: N/A;   CARDIOVERSION N/A 10/28/2015   Procedure: CARDIOVERSION;  Surgeon: Vesta Mixer, MD;  Location: [facility] ENDOSCOPY;  Service: Cardiovascular;  Laterality: N/A;   CARDIOVERSION  11/20/2015   "200 joules"   CARDIOVERSION N/A 07/02/2022   Procedure: CARDIOVERSION;  Surgeon: Jodelle Red, MD;  Location: [facility] ENDOSCOPY;  Service: Cardiovascular;  Laterality: N/A;   CARDIOVERSION N/A 11/10/2022   Procedure: CARDIOVERSION;  Surgeon:  Thurmon Fair, MD;  Location: [facility] INVASIVE CV LAB;  Service: Cardiovascular;  Laterality: N/A;   CORONARY ARTERY BYPASS GRAFT  05/2009   LIMA-LAD, SVG-OM, SVG-PDA 06/20/09   ELBOW FRACTURE SURGERY Left    broken ulna on left elbow-surgical repair   ELECTROPHYSIOLOGIC STUDY N/A 10/31/2014   Procedure: A-Flutter;  Surgeon: Marinus Maw, MD;  Location: [facility] INVASIVE CV LAB;  Service: Cardiovascular;  Laterality: N/A;   ELECTROPHYSIOLOGIC STUDY N/A 11/20/2015   Procedure: A-Flutter Ablation;  Surgeon: Marinus Maw, MD;  Location: [facility] INVASIVE CV LAB;  Service: Cardiovascular;  Laterality: N/A;   EYE SURGERY     FRACTURE SURGERY     INTRAOPERATIVE TRANSTHORACIC ECHOCARDIOGRAM N/A 06/02/2022   Procedure: INTRAOPERATIVE TRANSTHORACIC ECHOCARDIOGRAM;  Surgeon: Kathleene Hazel, MD;  Location[facility] OR;  Service: Open Heart Surgery;  Laterality: N/A;   PACEMAKER IMPLANT N/A 06/03/2022   Procedure: PACEMAKER IMPLANT;  Surgeon: Regan Lemming, MD;  Location: [facility] INVASIVE CV LAB;  Service: Cardiovascular;  Laterality: N/A;   RIGHT/LEFT HEART CATH AND CORONARY/GRAFT ANGIOGRAPHY N/A 04/13/2022   Procedure: RIGHT/LEFT HEART CATH AND CORONARY/GRAFT ANGIOGRAPHY;  Surgeon: Kathleene Hazel, MD;  Location: [facility] INVASIVE CV LAB;  Service: Cardiovascular;  Laterality: N/A;   TEE WITHOUT CARDIOVERSION N/A 03/13/2022   Procedure: TRANSESOPHAGEAL ECHOCARDIOGRAM (TEE);  Surgeon: Thurmon Fair, MD;  Location: [facility] ENDOSCOPY;  Service: Cardiovascular;  Laterality: N/A;   TESTICLE REMOVAL Right 1982   TONSILLECTOMY  ~ 1967   TRANSCATHETER AORTIC VALVE REPLACEMENT, TRANSFEMORAL N/A 06/02/2022   Procedure: Transcatheter Aortic Valve Replacement, Transfemoral;  Surgeon: Kathleene Hazel, MD;  Location: [facility] OR;  Service: Open Heart Surgery;  Laterality: N/A;    SOCIAL HISTORY: Social History   Socioeconomic History   Marital status: Married    Spouse name: Not on file   Number of children: 0   Years of  education: Not on file   Highest education level: Not on file  Occupational History   Occupation: Photo journalist  Tobacco Use   Smoking status: Former    Current packs/day: 0.00    Average packs/day: 0.5 packs/day for 15.0 years (7.5 ttl pk-yrs)    Types: Cigarettes    Start date: 49    Quit date: 1990    Years since quitting: 34.7   Smokeless tobacco: Never   Tobacco comments:    "quit smoking cigarettes in the 1980's; don't know how much or for how long"  Vaping Use   Vaping status: Never Used  Substance and Sexual Activity   Alcohol use: Yes    Alcohol/week: 3.0 standard drinks of alcohol    Types: 3 Cans of beer per week   Drug use: No    Comment: "quit smoking pot in the 1990s"   Sexual activity: Not Currently    Birth control/protection: Coitus interruptus  Other Topics Concern   Not  on file  Social History Narrative   Not on file   Social Determinants of Health   Financial Resource Strain: Not on file  Food Insecurity: No Food Insecurity (06/05/2022)   Hunger Vital Sign    Worried About Running Out of Food in the Last Year: Never true    Ran Out of Food in the Last Year: Never true  Transportation Needs: No Transportation Needs (06/05/2022)   PRAPARE - Administrator, Civil Service (Medical): No    Lack of Transportation (Non-Medical): No  Physical Activity: Not on file  Stress: Not on file  Social Connections: Not on file  Intimate Partner Violence: Not At Risk (03/28/2022)   Humiliation, Afraid, Rape, and Kick questionnaire    Fear of Current or Ex-Partner: No    Emotionally Abused: No    Physically Abused: No    Sexually Abused: No    FAMILY HISTORY: Family History  Problem Relation Age of Onset   Diabetes Mother    Heart disease Mother        Heart murmur   Heart disease Father    Heart attack Father    Diabetes Father    Hypertension Father    Diabetes Sister     ALLERGIES:  is allergic to actos [pioglitazone], bee venom, codeine,  metformin, statins, and raspberry.  MEDICATIONS:  Current Outpatient Medications  Medication Sig Dispense Refill   apixaban (ELIQUIS) 5 MG TABS tablet Take 1 tablet (5 mg total) by mouth 2 (two) times daily. 60 tablet 11   BD VEO INSULIN SYRINGE U/F 31G X 15/64" 0.5 ML MISC USE TO INJECT 4 TIMES A DAY 400 each 3   Continuous Blood Gluc Sensor (DEXCOM G7 SENSOR) MISC by Does not apply route.     cyanocobalamin (VITAMIN B12) 1000 MCG tablet Take 1,000 mcg by mouth daily.     dorzolamide-timolol (COSOPT) 22.3-6.8 MG/ML ophthalmic solution Place 1 drop into the left eye 2 (two) times daily.     furosemide (LASIX) 40 MG tablet Take 40 mg by mouth as needed for fluid or edema.     insulin NPH Human (NOVOLIN N) 100 UNIT/ML injection INJECT 0.2-0.24 MLS (20-24 UNITS TOTAL) INTO THE SKIN AT BEDTIME. (Patient taking differently: Inject 20-25 Units into the skin at bedtime.) 30 mL 0   Insulin Pen Needle (PEN NEEDLES) 32G X 4 MM MISC Use as instructed to inject insulin 100 each 3   latanoprost (XALATAN) 0.005 % ophthalmic solution Place 1 drop into the left eye at bedtime.     lisinopril (ZESTRIL) 5 MG tablet TAKE 1 TABLET BY MOUTH EVERY DAY 90 tablet 3   metoprolol succinate (TOPROL-XL) 50 MG 24 hr tablet Take 50 mg by mouth 2 (two) times daily. Take with or immediately following a meal.     NOVOLOG 100 UNIT/ML injection INJECT 15-20 UNITS INTO THE SKIN 3 (THREE) TIMES DAILY WITH MEALS. SLIDING SCALE 30 mL 1   potassium chloride (KLOR-CON) 10 MEQ tablet Take 10 mEq by mouth as needed (with lasix).     No current facility-administered medications for this visit.    REVIEW OF SYSTEMS:   Constitutional: ( - ) fevers, ( - )  chills , ( - ) night sweats Eyes: ( - ) blurriness of vision, ( - ) double vision, ( - ) watery eyes Ears, nose, mouth, throat, and face: ( - ) mucositis, ( - ) sore throat Respiratory: ( - ) cough, ( - ) dyspnea, ( - )  wheezes Cardiovascular: ( - ) palpitation, ( - ) chest  discomfort, ( - ) lower extremity swelling Gastrointestinal:  ( - ) nausea, ( - ) heartburn, ( - ) change in bowel habits Skin: ( - ) abnormal skin rashes Lymphatics: ( - ) new lymphadenopathy, ( - ) easy bruising Neurological: ( - ) numbness, ( - ) tingling, ( - ) new weaknesses Behavioral/Psych: ( - ) mood change, ( - ) new changes  All other systems were reviewed with the patient and are negative.  PHYSICAL EXAMINATION:  Vitals:   01/21/23 1419 01/21/23 1424  BP: (!) 163/59 (!) 162/56  Pulse: 83   Resp: 16   Temp: 98 F (36.7 C)   SpO2: 100%    Filed Weights   01/21/23 1419  Weight: 196 lb (88.9 kg)    GENERAL: Well-appearing elderly Caucasian male, alert, no distress and comfortable SKIN: skin color, texture, turgor are normal, no rashes or significant lesions EYES: conjunctiva are pink and non-injected, sclera clear LUNGS: clear to auscultation and percussion with normal breathing effort HEART: regular rate & rhythm and no murmurs and no lower extremity edema Musculoskeletal: no cyanosis of digits and no clubbing  PSYCH: alert & oriented x 3, fluent speech NEURO: no focal motor/sensory deficits  LABORATORY DATA:  I have reviewed the data as listed    Latest Ref Rng & Units 01/19/2023   10:41 AM 11/06/2022    1:15 PM 07/13/2022    1:37 PM  CBC  WBC 4.0 - 10.5 K/uL 6.3  6.3  7.6   Hemoglobin 13.0 - 17.0 g/dL 29.5  62.1  30.8   Hematocrit 39.0 - 52.0 % 36.0  33.3  33.9   Platelets 150 - 400 K/uL 223  187  268        Latest Ref Rng & Units 01/19/2023   10:41 AM 11/06/2022    1:15 PM 07/13/2022    1:37 PM  CMP  Glucose 70 - 99 mg/dL 657  846  962   BUN 8 - 23 mg/dL 18  20  17    Creatinine 0.61 - 1.24 mg/dL 9.52  8.41  3.24   Sodium 135 - 145 mmol/L 139  138  137   Potassium 3.5 - 5.1 mmol/L 4.3  4.6  4.1   Chloride 98 - 111 mmol/L 106  107  102   CO2 22 - 32 mmol/L 30  24  30    Calcium 8.9 - 10.3 mg/dL 9.1  8.8  9.1   Total Protein 6.5 - 8.1 g/dL 6.7   7.3    Total Bilirubin 0.3 - 1.2 mg/dL 0.6   0.5   Alkaline Phos 38 - 126 U/L 88   95   AST 15 - 41 U/L 18   16   ALT 0 - 44 U/L 14   12     Lab Results  Component Value Date   MPROTEIN Not Observed 07/13/2022   MPROTEIN Not Observed 06/02/2022   Lab Results  Component Value Date   KPAFRELGTCHN 46.1 (H) 07/13/2022   KPAFRELGTCHN 38.2 (H) 06/02/2022   LAMBDASER 25.6 07/13/2022   LAMBDASER 19.8 06/02/2022   KAPLAMBRATIO 5.31 07/15/2022   KAPLAMBRATIO 1.80 (H) 07/13/2022   KAPLAMBRATIO 1.93 (H) 06/02/2022     RADIOGRAPHIC STUDIES: No results found.  ASSESSMENT & PLAN Matthew [name] 77 y.o. male with medical history significant for vitamin B12 deficiency who presents for a follow up visit.  After review of the labs, review of  the records, and discussion with the patient the patients findings are most consistent with normocytic anemia of unclear etiology and elevated serum free light chain ratio.  At this time I have a low suspicion for AL amyloidosis as the cause of his findings cardiac findings.    # Normocytic Anemia, Chronic # Vitamin B12 Borderline Low  --Prior nutritional panel revealed a vitamin B12 of 200, borderline low. -- Amyloidosis and multiple myeloma workup as noted below -- If no clear etiology can be found in hemoglobin continues to drop would recommend considering a bone marrow biopsy. --Labs today show modest improvement with white blood cell 6.3, hemoglobin 9.9, MCV 86.5, and platelets of 223 --Continue p.o. vitamin 12,000 mcg p.o. daily. 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 -- No evidence of monoclonal gammopathy, very unlikely probability of his cardiac findings representing AL amyloidosis.  No orders of the defined types were placed in this encounter.   All questions were  answered. The patient knows to call the clinic with any problems, questions or concerns.  A total of more than 30 minutes were spent on this encounter with face-to-face time and non-face-to-face time, including preparing to see the patient, ordering tests and/or medications, counseling the patient and coordination of care as outlined above.   Ulysees Barns, MD Department of Hematology/Oncology [facility] Phone: [phone] Pager: [phone] Email: [email or web address]  01/21/2023 3:06 PM

## 2023-01-26 DIAGNOSIS — M4180 Other forms of scoliosis, site unspecified: Secondary | ICD-10-CM | POA: Diagnosis not present

## 2023-01-26 DIAGNOSIS — M5451 Vertebrogenic low back pain: Secondary | ICD-10-CM | POA: Diagnosis not present

## 2023-01-27 ENCOUNTER — Ambulatory Visit (HOSPITAL_COMMUNITY): Payer: Medicare HMO

## 2023-02-08 DIAGNOSIS — M4180 Other forms of scoliosis, site unspecified: Secondary | ICD-10-CM | POA: Diagnosis not present

## 2023-02-08 DIAGNOSIS — M5451 Vertebrogenic low back pain: Secondary | ICD-10-CM | POA: Diagnosis not present

## 2023-02-10 ENCOUNTER — Ambulatory Visit: Payer: Medicare HMO

## 2023-02-15 ENCOUNTER — Ambulatory Visit: Payer: Medicare HMO | Admitting: Endocrinology

## 2023-02-15 ENCOUNTER — Encounter: Payer: Self-pay | Admitting: Endocrinology

## 2023-02-15 VITALS — BP 160/78 | HR 60 | Resp 20 | Ht 64.0 in | Wt 197.2 lb

## 2023-02-15 DIAGNOSIS — E11319 Type 2 diabetes mellitus with unspecified diabetic retinopathy without macular edema: Secondary | ICD-10-CM

## 2023-02-15 DIAGNOSIS — Z794 Long term (current) use of insulin: Secondary | ICD-10-CM

## 2023-02-15 DIAGNOSIS — E1165 Type 2 diabetes mellitus with hyperglycemia: Secondary | ICD-10-CM

## 2023-02-15 LAB — POCT GLYCOSYLATED HEMOGLOBIN (HGB A1C): Hemoglobin A1C: 8 % — AB (ref 4.0–5.6)

## 2023-02-15 MED ORDER — GVOKE HYPOPEN 1-PACK 1 MG/0.2ML ~~LOC~~ SOAJ: 1.0000 mg | SUBCUTANEOUS | 2 refills | Status: DC | PRN

## 2023-02-15 NOTE — Patient Instructions (Signed)
Diabetes regimen: Change Novolin N 16 units with BF and 10 units with supper ( not at bedtime) Novolog 5-10 units with meals three times a day plus sliding scale.  Moderate Sliding Scale Blood Glucose        Insulin 60-150                     None 151-200                   3 units 201-250                   5 units 251-300                   7 units 301-350                   9 units 351-400                  11 units    >400                       12 units and call provider

## 2023-02-15 NOTE — Progress Notes (Signed)
Outpatient Endocrinology Note [place] Shasta Chinn, MD  02/15/23  Patient's Name: Matthew [name]    DOB: 1945/06/27    MRN: [id number]                                                    REASON OF VISIT: Follow up for type 2 diabetes mellitus  PCP: [name], MD  HISTORY OF PRESENT ILLNESS:   Matthew [name] is a 77 y.o. old male with past medical history listed below, is here for follow up of type 2 diabetes mellitus.   Pertinent Diabetes History: Patient was diagnosed with type 2 diabetes mellitus in 1992.  He was initially treated with Glucophage.  He was treated with premixed insulin for several years 15+ years.  Chronic Diabetes Complications : Retinopathy: yes, following with ophthalmology , reportedly.  Nephropathy: no, on lisinopril, history of microalbuminuria. Peripheral neuropathy: no Coronary artery disease: yes, s/p CABG Stroke: no  Relevant comorbidities and cardiovascular risk factors: Obesity: yes Body mass index is 33.85 kg/m.  Hypertension: yes Hyperlipidemia. Yes,  Lipids: He was previously on Vytorin and Lipitor. He thinks he had myalgias and weakness when taking these drugs. He has been recommended statin drugs in the lipid clinic but he refuses to take them stating that they will not help him. Also has significantly increased LDL particle number. He was prescribed Praluent at the cardiology clinic but he refused this. Has hypercholesterolemia and history of coronary artery bypass surgery. He was also suggested Leqvio previously and he refused  Current / Home Diabetic regimen includes: Novolin N 20 units at bedtime. Novolog 10-15 units before meals three times a day.   Prior diabetic medications: Glucophage/metformin, ?  Nausea/malaise. Actos, edema. Farxiga - polyuria.  Glycemic data:    CONTINUOUS GLUCOSE MONITORING SYSTEM (CGMS) INTERPRETATION: At today's visit, we reviewed CGM downloads. The full report is scanned in the media.  Reviewing the CGM trends, blood glucose are as follows:  Dexcom G7 CGM-  Sensor Download (Sensor download was reviewed and summarized below.) Dates: October 8 - February 15, 2023, 14 days  Glucose Management Indicator: 7.9% Sensor Average: 191 SD 43.3  Glycemic Trends:  <54: <1% 54-70: 4% 71-180: 45% 181-250: 27% 251-400: 24%  Interpretation: -Postprandial hyperglycemia with a blood sugar 200 -  350 range.  On October 18 Friday blood sugar stays persistently high with a blood sugar in the range of 350-400 range.  Frequent trending down and hypoglycemia in the morning fasting with a blood sugar in 60s likely related with NPH insulin at bedtime.  Occasional hypoglycemia and between the meals likely related with correctional boluses.  Hypoglycemia: Patient has minor hypoglycemic episodes. Patient has hypoglycemia awareness.  Factors modifying glucose control: 1.  Diabetic diet assessment: 2-3 meals a day.  2.  Staying active or exercising: Limited physical activity decreased exercise,  Significantly.  3.  Medication compliance: compliant most of the time.  Interval history 02/15/23 CGM data as reviewed above.  Diabetes regimen as noted above.  He has no complaints.  REVIEW OF SYSTEMS As per history of present illness.   PAST MEDICAL HISTORY: Past Medical History:  Diagnosis Date   Anemia    Arthritis    "back, left ankle" (11/20/2015)   Cataract    CHF (congestive heart failure) (HCC)    Chronic lower back  pain    Coronary artery disease    a. 05/2009 CABG x 3: LIMA->LAD, VG->OM, VG->PDA; b. Nuc 01/2014: inf-lat scar but no ischemia, EF 57%.   Diabetic retinopathy (HCC)    mild- Dr. [name]   Glaucoma    Hyperlipidemia    a. Intolerant of lipitor and vytorin.   Lyme disease    Migraine    "once or twice" (11/20/2015)   Myocardial infarction [facility])    'saw evidence of it on an EKG done in 2005"   Paroxysmal atrial flutter (HCC)    a. 01/2014 s/p DCCV;  b. CHA2DS2VASc =  3-->Eliquis.   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 Clifton James and Dr. Laneta Simmers   Sacral pain    "right"   Sciatica of right side    Testicular cancer (HCC) 1982   "heavy doses of radiation; it was stage 2"   Type II diabetes mellitus (HCC)    Valvular heart disease    a. 01/2014 Echo: Ef 60-65%, no rwma, mild AI/MS, mod MR, mildly dil LA.    PAST SURGICAL HISTORY: Past Surgical History:  Procedure Laterality Date   ABDOMINAL EXPLORATION SURGERY  ~ 2005   "for hernia, but didn't have one"   ATRIAL FIBRILLATION ABLATION N/A 09/17/2016   Procedure: Atrial Fibrillation Ablation;  Surgeon: Regan Lemming, MD;  Location: [facility] INVASIVE CV LAB;  Service: Cardiovascular;  Laterality: N/A;   CARDIAC CATHETERIZATION  2011   CARDIOVERSION N/A 02/19/2014   Procedure: CARDIOVERSION;  Surgeon: Lesleigh Noe, MD;  Location: [facility] ENDOSCOPY;  Service: Cardiovascular;  Laterality: N/A;   CARDIOVERSION N/A 10/28/2015   Procedure: CARDIOVERSION;  Surgeon: Vesta Mixer, MD;  Location: [facility] ENDOSCOPY;  Service: Cardiovascular;  Laterality: N/A;   CARDIOVERSION  11/20/2015   "200 joules"   CARDIOVERSION N/A 07/02/2022   Procedure: CARDIOVERSION;  Surgeon: Jodelle Red, MD;  Location: [facility] ENDOSCOPY;  Service: Cardiovascular;  Laterality: N/A;   CARDIOVERSION N/A 11/10/2022   Procedure: CARDIOVERSION;  Surgeon: Thurmon Fair, MD;  Location: [facility] INVASIVE CV LAB;  Service: Cardiovascular;  Laterality: N/A;   CORONARY ARTERY BYPASS GRAFT  05/2009   LIMA-LAD, SVG-OM, SVG-PDA 06/20/09   ELBOW FRACTURE SURGERY Left    broken ulna on left elbow-surgical repair   ELECTROPHYSIOLOGIC STUDY N/A 10/31/2014   Procedure: A-Flutter;  Surgeon: Marinus Maw, MD;  Location: [facility] INVASIVE CV LAB;  Service: Cardiovascular;  Laterality: N/A;   ELECTROPHYSIOLOGIC STUDY N/A 11/20/2015   Procedure: A-Flutter Ablation;  Surgeon: Marinus Maw,  MD;  Location: [facility] INVASIVE CV LAB;  Service: Cardiovascular;  Laterality: N/A;   EYE SURGERY     FRACTURE SURGERY     INTRAOPERATIVE TRANSTHORACIC ECHOCARDIOGRAM N/A 06/02/2022   Procedure: INTRAOPERATIVE TRANSTHORACIC ECHOCARDIOGRAM;  Surgeon: Kathleene Hazel, MD;  Location: [facility] OR;  Service: Open Heart Surgery;  Laterality: N/A;   PACEMAKER IMPLANT N/A 06/03/2022   Procedure: PACEMAKER IMPLANT;  Surgeon: Regan Lemming, MD;  Location: [facility] INVASIVE CV LAB;  Service: Cardiovascular;  Laterality: N/A;   RIGHT/LEFT HEART CATH AND CORONARY/GRAFT ANGIOGRAPHY N/A 04/13/2022   Procedure: RIGHT/LEFT HEART CATH AND CORONARY/GRAFT ANGIOGRAPHY;  Surgeon: Kathleene Hazel, MD;  Location: [facility] INVASIVE CV LAB;  Service: Cardiovascular;  Laterality: N/A;   TEE WITHOUT CARDIOVERSION N/A 03/13/2022   Procedure: TRANSESOPHAGEAL ECHOCARDIOGRAM (TEE);  Surgeon: Thurmon Fair, MD;  Location: [facility] ENDOSCOPY;  Service: Cardiovascular;  Laterality: N/A;   TESTICLE REMOVAL Right 1982  TONSILLECTOMY  ~ 1967   TRANSCATHETER AORTIC VALVE REPLACEMENT, TRANSFEMORAL N/A 06/02/2022   Procedure: Transcatheter Aortic Valve Replacement, Transfemoral;  Surgeon: Kathleene Hazel, MD;  Location: [facility] OR;  Service: Open Heart Surgery;  Laterality: N/A;    ALLERGIES: Allergies  Allergen Reactions   Actos [Pioglitazone] Swelling   Bee Venom Swelling   Codeine Hives and Nausea Only   Metformin Nausea Only   Statins Other (See Comments)    Muscle pain, fatigue   Raspberry Itching    FAMILY HISTORY:  Family History  Problem Relation Age of Onset   Diabetes Mother    Heart disease Mother        Heart murmur   Heart disease Father    Heart attack Father    Diabetes Father    Hypertension Father    Diabetes Sister     SOCIAL HISTORY: Social History   Socioeconomic History   Marital status: Married    Spouse name: Not on file   Number of children: 0   Years of education: Not on file   Highest  education level: Not on file  Occupational History   Occupation: Insurance account manager  Tobacco Use   Smoking status: Former    Current packs/day: 0.00    Average packs/day: 0.5 packs/day for 15.0 years (7.5 ttl pk-yrs)    Types: Cigarettes    Start date: 57    Quit date: 1990    Years since quitting: 34.8   Smokeless tobacco: Never   Tobacco comments:    "quit smoking cigarettes in the 1980's; don't know how much or for how long"  Vaping Use   Vaping status: Never Used  Substance and Sexual Activity   Alcohol use: Yes    Alcohol/week: 3.0 standard drinks of alcohol    Types: 3 Cans of beer per week   Drug use: No    Comment: "quit smoking pot in the 1990s"   Sexual activity: Not Currently    Birth control/protection: Coitus interruptus  Other Topics Concern   Not on file  Social History Narrative   Not on file   Social Determinants of Health   Financial Resource Strain: Not on file  Food Insecurity: No Food Insecurity (06/05/2022)   Hunger Vital Sign    Worried About Running Out of Food in the Last Year: Never true    Ran Out of Food in the Last Year: Never true  Transportation Needs: No Transportation Needs (06/05/2022)   PRAPARE - Administrator, Civil Service (Medical): No    Lack of Transportation (Non-Medical): No  Physical Activity: Not on file  Stress: Not on file  Social Connections: Not on file    MEDICATIONS:  Current Outpatient Medications  Medication Sig Dispense Refill   apixaban (ELIQUIS) 5 MG TABS tablet Take 1 tablet (5 mg total) by mouth 2 (two) times daily. 60 tablet 11   BD VEO INSULIN SYRINGE U/F 31G X 15/64" 0.5 ML MISC USE TO INJECT 4 TIMES A DAY 400 each 3   Continuous Blood Gluc Sensor (DEXCOM G7 SENSOR) MISC by Does not apply route.     cyanocobalamin (VITAMIN B12) 1000 MCG tablet Take 1,000 mcg by mouth daily.     dorzolamide-timolol (COSOPT) 22.3-6.8 MG/ML ophthalmic solution Place 1 drop into the left eye 2 (two) times daily.      furosemide (LASIX) 40 MG tablet Take 40 mg by mouth as needed for fluid or edema.     Glucagon (GVOKE HYPOPEN 1-PACK)  1 MG/0.2ML SOAJ Inject 1 mg into the skin as needed (low blood sugar with impaired consciousness). 0.4 mL 2   insulin NPH Human (NOVOLIN N) 100 UNIT/ML injection INJECT 0.2-0.24 MLS (20-24 UNITS TOTAL) INTO THE SKIN AT BEDTIME. (Patient taking differently: Inject 20-25 Units into the skin at bedtime.) 30 mL 0   Insulin Pen Needle (PEN NEEDLES) 32G X 4 MM MISC Use as instructed to inject insulin 100 each 3   latanoprost (XALATAN) 0.005 % ophthalmic solution Place 1 drop into the left eye at bedtime.     lisinopril (ZESTRIL) 5 MG tablet TAKE 1 TABLET BY MOUTH EVERY DAY 90 tablet 3   metoprolol succinate (TOPROL-XL) 50 MG 24 hr tablet Take 50 mg by mouth 2 (two) times daily. Take with or immediately following a meal.     NOVOLOG 100 UNIT/ML injection INJECT 15-20 UNITS INTO THE SKIN 3 (THREE) TIMES DAILY WITH MEALS. SLIDING SCALE 30 mL 1   potassium chloride (KLOR-CON) 10 MEQ tablet Take 10 mEq by mouth as needed (with lasix).     No current facility-administered medications for this visit.    PHYSICAL EXAM: Vitals:   02/15/23 1035 02/15/23 1037  BP: (!) 152/80 (!) 160/78  Pulse: 60   Resp: 20   SpO2: 99%   Weight: 197 lb 3.2 oz (89.4 kg)   Height: 5\' 4"  (1.626 m)    Body mass index is 33.85 kg/m.  Wt Readings from Last 3 Encounters:  02/15/23 197 lb 3.2 oz (89.4 kg)  01/21/23 196 lb (88.9 kg)  11/26/22 192 lb (87.1 kg)    General: Well developed, well nourished male in no apparent distress.  HEENT: AT/[place], no external lesions.  Eyes: Conjunctiva clear and no icterus. Neck: Neck supple  Lungs: Respirations not labored Neurologic: Alert, oriented, normal speech Extremities / Skin: Dry. .  Psychiatric: Does not appear depressed or anxious  Diabetic Foot Exam - Simple   No data filed    LABS Reviewed Lab Results  Component Value Date   HGBA1C 8.0 (A) 02/15/2023    HGBA1C 7.8 (A) 06/24/2022   HGBA1C 8.1 (H) 12/15/2021   Lab Results  Component Value Date   FRUCTOSAMINE 337 (H) 10/11/2019   FRUCTOSAMINE 321 (H) 03/07/2018   FRUCTOSAMINE 381 (H) 12/10/2015   Lab Results  Component Value Date   CHOL 228 (H) 07/07/2021   HDL 45.40 07/07/2021   LDLCALC 147 (H) 07/07/2021   TRIG 177.0 (H) 07/07/2021   CHOLHDL 5 07/07/2021   Lab Results  Component Value Date   MICRALBCREAT 11.6 12/17/2021   MICRALBCREAT 4.5 10/29/2020   Lab Results  Component Value Date   CREATININE 1.09 01/19/2023   Lab Results  Component Value Date   GFR 66.60 10/23/2021    ASSESSMENT / PLAN  1. Uncontrolled type 2 diabetes mellitus with hyperglycemia, with long-term current use of insulin (HCC)   2. Diabetic retinopathy associated with type 2 diabetes mellitus, macular edema presence unspecified, unspecified laterality, unspecified retinopathy severity (HCC)     Diabetes Mellitus type 2, complicated by diabetic retinopathy/CAD. - Diabetic status / severity: Uncontrolled.  Lab Results  Component Value Date   HGBA1C 8.0 (A) 02/15/2023    - Hemoglobin A1c goal : <7%  Discussed about long-acting basal insulin like Tresiba.  He reports NPH insulin works better for him.  He prefers to be on insulin vial.  He also prefers to mix NPH insulin with the NovoLog.  - Medications: Adjusted diabetes regimen as follows. Diabetes regimen:  Change Novolin N 16 units with BF and 10 units with supper ( not at bedtime) Novolog 5-10 units with meals three times a day plus sliding scale.  Moderate Sliding Scale Blood Glucose        Insulin 60-150                     None 151-200                   3 units 201-250                   5 units 251-300                   7 units 301-350                   9 units 351-400                  11 units    >400                       12 units and call provider   - Home glucose testing: Dexcom G7 and check as needed.  Discussed about  advantages of using Dexcom CGM to regulate his meals and definitely to monitor blood sugar. - Discussed/ Gave Hypoglycemia treatment plan.  # Consult : not required at this time.   # Annual urine for microalbuminuria/ creatinine ratio, no microalbuminuria currently, continue ACE/ARB /lisinopril.  Asked to check urine microalbumin creatinine ratio today.  Patient denied. Last  Lab Results  Component Value Date   MICRALBCREAT 11.6 12/17/2021    # Foot check nightly.  # Patient has diabetic retinopathy, following with ophthalmology reportedly..   - Diet: Make healthy diabetic food choices - Life style / activity / exercise: Discussed.  2. Blood pressure  -  BP Readings from Last 1 Encounters:  02/15/23 (!) 160/78    - Control is not in target.  - No change in current plans.  Advised to monitor at home and discussed with primary care provider to adjust antihypertensive medication. 3. Lipid status / Hyperlipidemia - Last  Lab Results  Component Value Date   LDLCALC 147 (H) 07/07/2021   -Patient not on a statin.  Discussed about checking lipid panel today.  Patient refused.  Diagnoses and all orders for this visit:  Uncontrolled type 2 diabetes mellitus with hyperglycemia, with long-term current use of insulin (HCC) -     POCT glycosylated hemoglobin (Hb A1C)  Diabetic retinopathy associated with type 2 diabetes mellitus, macular edema presence unspecified, unspecified laterality, unspecified retinopathy severity (HCC)  Other orders -     Glucagon (GVOKE HYPOPEN 1-PACK) 1 MG/0.2ML SOAJ; Inject 1 mg into the skin as needed (low blood sugar with impaired consciousness).    DISPOSITION Follow up in clinic in 3 months suggested.   All questions answered and patient verbalized understanding of the plan.  [place] Armandina Iman, MD [facility] Endocrinology [facility] [address] [place], [address] [place], [place] [address] Phone # [phone]  At least part of this note was  generated using voice recognition software. Inadvertent word errors may have occurred, which were not recognized during the proofreading process.

## 2023-02-16 ENCOUNTER — Encounter: Payer: Self-pay | Admitting: Endocrinology

## 2023-02-17 DIAGNOSIS — M5451 Vertebrogenic low back pain: Secondary | ICD-10-CM | POA: Diagnosis not present

## 2023-02-17 DIAGNOSIS — M4180 Other forms of scoliosis, site unspecified: Secondary | ICD-10-CM | POA: Diagnosis not present

## 2023-03-03 DIAGNOSIS — M5451 Vertebrogenic low back pain: Secondary | ICD-10-CM | POA: Diagnosis not present

## 2023-03-03 DIAGNOSIS — M4180 Other forms of scoliosis, site unspecified: Secondary | ICD-10-CM | POA: Diagnosis not present

## 2023-03-04 DIAGNOSIS — Z961 Presence of intraocular lens: Secondary | ICD-10-CM | POA: Diagnosis not present

## 2023-03-04 DIAGNOSIS — H35352 Cystoid macular degeneration, left eye: Secondary | ICD-10-CM | POA: Diagnosis not present

## 2023-03-04 DIAGNOSIS — H35372 Puckering of macula, left eye: Secondary | ICD-10-CM | POA: Diagnosis not present

## 2023-03-04 DIAGNOSIS — T85398A Other mechanical complication of other ocular prosthetic devices, implants and grafts, initial encounter: Secondary | ICD-10-CM | POA: Diagnosis not present

## 2023-03-04 DIAGNOSIS — H35342 Macular cyst, hole, or pseudohole, left eye: Secondary | ICD-10-CM | POA: Diagnosis not present

## 2023-03-04 DIAGNOSIS — Z794 Long term (current) use of insulin: Secondary | ICD-10-CM | POA: Diagnosis not present

## 2023-03-04 DIAGNOSIS — E113513 Type 2 diabetes mellitus with proliferative diabetic retinopathy with macular edema, bilateral: Secondary | ICD-10-CM | POA: Diagnosis not present

## 2023-03-04 DIAGNOSIS — E113522 Type 2 diabetes mellitus with proliferative diabetic retinopathy with traction retinal detachment involving the macula, left eye: Secondary | ICD-10-CM | POA: Diagnosis not present

## 2023-03-08 ENCOUNTER — Encounter: Payer: Self-pay | Admitting: Cardiology

## 2023-03-09 ENCOUNTER — Other Ambulatory Visit: Payer: Self-pay | Admitting: Medical Genetics

## 2023-03-09 DIAGNOSIS — Z006 Encounter for examination for normal comparison and control in clinical research program: Secondary | ICD-10-CM

## 2023-03-10 DIAGNOSIS — M5451 Vertebrogenic low back pain: Secondary | ICD-10-CM | POA: Diagnosis not present

## 2023-03-10 DIAGNOSIS — M4180 Other forms of scoliosis, site unspecified: Secondary | ICD-10-CM | POA: Diagnosis not present

## 2023-03-11 ENCOUNTER — Other Ambulatory Visit: Payer: Self-pay | Admitting: Cardiology

## 2023-03-11 DIAGNOSIS — Z952 Presence of prosthetic heart valve: Secondary | ICD-10-CM

## 2023-03-12 ENCOUNTER — Ambulatory Visit: Payer: Medicare HMO | Attending: Cardiology

## 2023-03-12 DIAGNOSIS — I442 Atrioventricular block, complete: Secondary | ICD-10-CM

## 2023-03-13 LAB — CUP PACEART REMOTE DEVICE CHECK
Battery Remaining Longevity: 115 mo
Battery Remaining Percentage: 95.5 %
Battery Voltage: 3.02 V
Brady Statistic AP VP Percent: 1 %
Brady Statistic AP VS Percent: 19 %
Brady Statistic AS VP Percent: 1 %
Brady Statistic AS VS Percent: 80 %
Brady Statistic RA Percent Paced: 15 %
Brady Statistic RV Percent Paced: 3.5 %
Date Time Interrogation Session: 20241115020013
Implantable Lead Connection Status: 753985
Implantable Lead Connection Status: 753985
Implantable Lead Implant Date: 20240207
Implantable Lead Implant Date: 20240207
Implantable Lead Location: 753859
Implantable Lead Location: 753860
Implantable Pulse Generator Implant Date: 20240207
Lead Channel Impedance Value: 400 Ohm
Lead Channel Impedance Value: 560 Ohm
Lead Channel Pacing Threshold Amplitude: 0.5 V
Lead Channel Pacing Threshold Amplitude: 0.75 V
Lead Channel Pacing Threshold Pulse Width: 0.5 ms
Lead Channel Pacing Threshold Pulse Width: 0.5 ms
Lead Channel Sensing Intrinsic Amplitude: 12 mV
Lead Channel Sensing Intrinsic Amplitude: 2.1 mV
Lead Channel Setting Pacing Amplitude: 2 V
Lead Channel Setting Pacing Amplitude: 2.5 V
Lead Channel Setting Pacing Pulse Width: 0.5 ms
Lead Channel Setting Sensing Sensitivity: 2 mV
Pulse Gen Model: 2272
Pulse Gen Serial Number: 8154174

## 2023-03-17 ENCOUNTER — Ambulatory Visit: Payer: Medicare HMO | Admitting: Family Medicine

## 2023-03-24 NOTE — Progress Notes (Signed)
Remote pacemaker transmission.   

## 2023-03-28 ENCOUNTER — Other Ambulatory Visit: Payer: Self-pay | Admitting: Family Medicine

## 2023-03-29 ENCOUNTER — Other Ambulatory Visit: Payer: Self-pay

## 2023-03-29 DIAGNOSIS — E1165 Type 2 diabetes mellitus with hyperglycemia: Secondary | ICD-10-CM

## 2023-03-29 DIAGNOSIS — E7800 Pure hypercholesterolemia, unspecified: Secondary | ICD-10-CM

## 2023-03-30 ENCOUNTER — Other Ambulatory Visit: Payer: Medicare HMO

## 2023-04-07 DIAGNOSIS — T85398A Other mechanical complication of other ocular prosthetic devices, implants and grafts, initial encounter: Secondary | ICD-10-CM | POA: Diagnosis not present

## 2023-04-07 DIAGNOSIS — Z48810 Encounter for surgical aftercare following surgery on the sense organs: Secondary | ICD-10-CM | POA: Diagnosis not present

## 2023-04-07 DIAGNOSIS — H3342 Traction detachment of retina, left eye: Secondary | ICD-10-CM | POA: Diagnosis not present

## 2023-04-07 DIAGNOSIS — H26492 Other secondary cataract, left eye: Secondary | ICD-10-CM | POA: Diagnosis not present

## 2023-04-07 DIAGNOSIS — Z8669 Personal history of other diseases of the nervous system and sense organs: Secondary | ICD-10-CM | POA: Diagnosis not present

## 2023-04-07 DIAGNOSIS — H35372 Puckering of macula, left eye: Secondary | ICD-10-CM | POA: Diagnosis not present

## 2023-04-08 ENCOUNTER — Other Ambulatory Visit (HOSPITAL_COMMUNITY): Payer: Self-pay | Attending: Medical Genetics

## 2023-04-11 DIAGNOSIS — E1165 Type 2 diabetes mellitus with hyperglycemia: Secondary | ICD-10-CM | POA: Diagnosis not present

## 2023-04-13 ENCOUNTER — Encounter: Payer: Self-pay | Admitting: Family Medicine

## 2023-04-23 ENCOUNTER — Other Ambulatory Visit: Payer: Self-pay | Admitting: Family Medicine

## 2023-05-03 ENCOUNTER — Other Ambulatory Visit: Payer: Self-pay

## 2023-05-03 DIAGNOSIS — E1165 Type 2 diabetes mellitus with hyperglycemia: Secondary | ICD-10-CM

## 2023-05-03 DIAGNOSIS — H35372 Puckering of macula, left eye: Secondary | ICD-10-CM | POA: Diagnosis not present

## 2023-05-03 DIAGNOSIS — Z961 Presence of intraocular lens: Secondary | ICD-10-CM | POA: Diagnosis not present

## 2023-05-03 DIAGNOSIS — E113522 Type 2 diabetes mellitus with proliferative diabetic retinopathy with traction retinal detachment involving the macula, left eye: Secondary | ICD-10-CM | POA: Diagnosis not present

## 2023-05-03 DIAGNOSIS — E113511 Type 2 diabetes mellitus with proliferative diabetic retinopathy with macular edema, right eye: Secondary | ICD-10-CM | POA: Diagnosis not present

## 2023-05-03 DIAGNOSIS — T85398A Other mechanical complication of other ocular prosthetic devices, implants and grafts, initial encounter: Secondary | ICD-10-CM | POA: Diagnosis not present

## 2023-05-03 DIAGNOSIS — H26492 Other secondary cataract, left eye: Secondary | ICD-10-CM | POA: Diagnosis not present

## 2023-05-03 DIAGNOSIS — E113512 Type 2 diabetes mellitus with proliferative diabetic retinopathy with macular edema, left eye: Secondary | ICD-10-CM | POA: Diagnosis not present

## 2023-05-03 DIAGNOSIS — H35352 Cystoid macular degeneration, left eye: Secondary | ICD-10-CM | POA: Diagnosis not present

## 2023-05-03 DIAGNOSIS — H211X2 Other vascular disorders of iris and ciliary body, left eye: Secondary | ICD-10-CM | POA: Diagnosis not present

## 2023-05-03 DIAGNOSIS — Z794 Long term (current) use of insulin: Secondary | ICD-10-CM | POA: Diagnosis not present

## 2023-05-03 DIAGNOSIS — H35342 Macular cyst, hole, or pseudohole, left eye: Secondary | ICD-10-CM | POA: Diagnosis not present

## 2023-05-03 DIAGNOSIS — H401131 Primary open-angle glaucoma, bilateral, mild stage: Secondary | ICD-10-CM | POA: Diagnosis not present

## 2023-05-03 MED ORDER — INSULIN NPH (HUMAN) (ISOPHANE) 100 UNIT/ML ~~LOC~~ SUSP
20.0000 [IU] | Freq: Every day | SUBCUTANEOUS | 1 refills | Status: DC
Start: 2023-05-03 — End: 2023-08-30

## 2023-05-11 ENCOUNTER — Other Ambulatory Visit (HOSPITAL_COMMUNITY)
Admission: RE | Admit: 2023-05-11 | Discharge: 2023-05-11 | Disposition: A | Discharge status: Home / Self Care | Payer: Self-pay | Source: Ambulatory Visit | Attending: Medical Genetics | Admitting: Medical Genetics

## 2023-05-11 DIAGNOSIS — Z006 Encounter for examination for normal comparison and control in clinical research program: Secondary | ICD-10-CM | POA: Insufficient documentation

## 2023-05-18 ENCOUNTER — Encounter: Payer: Self-pay | Admitting: Endocrinology

## 2023-05-18 ENCOUNTER — Ambulatory Visit (INDEPENDENT_AMBULATORY_CARE_PROVIDER_SITE_OTHER): Payer: Medicare HMO | Admitting: Endocrinology

## 2023-05-18 VITALS — BP 136/80 | HR 87 | Resp 20 | Ht 64.0 in | Wt 200.0 lb

## 2023-05-18 DIAGNOSIS — Z794 Long term (current) use of insulin: Secondary | ICD-10-CM

## 2023-05-18 DIAGNOSIS — E1165 Type 2 diabetes mellitus with hyperglycemia: Secondary | ICD-10-CM | POA: Diagnosis not present

## 2023-05-18 LAB — POCT GLYCOSYLATED HEMOGLOBIN (HGB A1C): Hemoglobin A1C: 7.9 % — AB (ref 4.0–5.6)

## 2023-05-18 NOTE — Patient Instructions (Addendum)
Novolin N 15 units with breakfast and 10 units with supper ( not at bedtime).  Novolog 5-10 units with meals three times a day plus sliding scale.  Take 15 minutes before eating.  Moderate Sliding Scale Blood Glucose        Insulin 60-150                     None 151-200                   3 units 201-250                   5 units 251-300                   7 units 301-350                   9 units 351-400                  11 units    >400                       12 units and call provider   Bedtime: Do not take Novolin N. Can correct for high blood sugar with sliding scale with NovoLog as follows.  Mild Sliding Scale Blood Glucose        Insulin 60-150                     None 151-200                   None 201-250                   0 units 251-300                   1 units 301-350                   2 units 351-400                   3 units      >400                        5 units and call provider

## 2023-05-18 NOTE — Progress Notes (Signed)
Outpatient Endocrinology Note [place] Desmon Hitchner, MD  05/18/23  Patient's Name: Matthew [name]    DOB: [birth year]    MRN: [id number]                                                    REASON OF VISIT: Follow up for type 2 diabetes mellitus  PCP: [name], MD  HISTORY OF PRESENT ILLNESS:   Matthew [name] is a 78 y.o. old male with past medical history listed below, is here for follow up of type 2 diabetes mellitus.   Pertinent Diabetes History: Patient was diagnosed with type 2 diabetes mellitus in 1992.  He was initially treated with Glucophage.  He was treated with premixed insulin for several years 15+ years.  Chronic Diabetes Complications : Retinopathy: yes, following with ophthalmology , reportedly. Retinal detachment, following with Dr. Barbaraann Barthel Nephropathy: no, on lisinopril, history of microalbuminuria. Peripheral neuropathy: no Coronary artery disease: yes, s/p CABG Stroke: no  Relevant comorbidities and cardiovascular risk factors: Obesity: yes Body mass index is 34.33 kg/m.  Hypertension: yes Hyperlipidemia. Yes,  Lipids: He was previously on Vytorin and Lipitor. He thinks he had myalgias and weakness when taking these drugs. He has been recommended statin drugs in the lipid clinic but he refuses to take them stating that they will not help him. Also has significantly increased LDL particle number. He was prescribed Praluent at the cardiology clinic but he refused this. Has hypercholesterolemia and history of coronary artery bypass surgery. He was also suggested Leqvio previously and he refused  Current / Home Diabetic regimen includes:  Novolin N 15 units with BF and 8-10 units at bedtime), he sometimes adjust dose of Novolin N. Novolog 5-10 units with meals three times a day plus sliding scale.  He sometimes correct for hyperglycemia at bedtime with NovoLog.  Prior diabetic medications: Glucophage/metformin, ?  Nausea/malaise. Actos, edema. Farxiga  - polyuria.  Glycemic data:    CONTINUOUS GLUCOSE MONITORING SYSTEM (CGMS) INTERPRETATION: At today's visit, we reviewed CGM downloads. The full report is scanned in the media. Reviewing the CGM trends, blood glucose are as follows:  Dexcom G7 CGM-  Sensor Download (Sensor download was reviewed and summarized below.) Dates: January 8 to May 18, 2023, 14 days  Glucose Management Indicator: 7.7%    Interpretation: Variable blood sugar.  Mostly significant hyperglycemia with blood sugar up to 350 range with lunch, occasional mild hyperglycemia with breakfast.  Occasional hyperglycemia with supper and bedtime snack with blood sugar up to 250 range.  He has intermittent hypoglycemia in the early morning with blood sugar in 60s related to taking NPH and NovoLog at bedtime.  Rare hypoglycemia after meals due to correction of hyperglycemia  Hypoglycemia: Patient has minor hypoglycemic episodes. Patient has hypoglycemia awareness.  Factors modifying glucose control: 1.  Diabetic diet assessment: 2-3 meals a day.  2.  Staying active or exercising: Limited physical activity decreased exercise,  Significantly.  3.  Medication compliance: compliant most of the time.  Interval history  Diabetes regimen and CGM data as reviewed above.  He is adjust the dose of Novolin and and NovoLog based on meal size and blood sugar, not clear about exact dosing.  However he is roughly taking Novolin and and NovoLog as noted above.  CGM data as reviewed above mostly variable blood  sugar.  He has been regularly following with retina specialist.  Denies numbness and tingling of the feet.  No other complaints today.  REVIEW OF SYSTEMS As per history of present illness.   PAST MEDICAL HISTORY: Past Medical History:  Diagnosis Date   Anemia    Arthritis    "back, left ankle" (11/20/2015)   Cataract    CHF (congestive heart failure) (HCC)    Chronic lower back pain    Coronary artery disease    a. 05/2009  CABG x 3: LIMA->LAD, VG->OM, VG->PDA; b. Nuc 01/2014: inf-lat scar but no ischemia, EF 57%.   Diabetic retinopathy (HCC)    mild- Dr. [name]   Glaucoma    Hyperlipidemia    a. Intolerant of lipitor and vytorin.   Lyme disease    Migraine    "once or twice" (11/20/2015)   Myocardial infarction [facility])    'saw evidence of it on an EKG done in 2005"   Paroxysmal atrial flutter (HCC)    a. 01/2014 s/p DCCV;  b. CHA2DS2VASc = 3-->Eliquis.   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 Clifton James and Dr. Laneta Simmers   Sacral pain    "right"   Sciatica of right side    Testicular cancer (HCC) 1982   "heavy doses of radiation; it was stage 2"   Type II diabetes mellitus (HCC)    Valvular heart disease    a. 01/2014 Echo: Ef 60-65%, no rwma, mild AI/MS, mod MR, mildly dil LA.    PAST SURGICAL HISTORY: Past Surgical History:  Procedure Laterality Date   ABDOMINAL EXPLORATION SURGERY  ~ 2005   "for hernia, but didn't have one"   ATRIAL FIBRILLATION ABLATION N/A 09/17/2016   Procedure: Atrial Fibrillation Ablation;  Surgeon: Regan Lemming, MD;  Location: [facility] INVASIVE CV LAB;  Service: Cardiovascular;  Laterality: N/A;   CARDIAC CATHETERIZATION  2011   CARDIOVERSION N/A 02/19/2014   Procedure: CARDIOVERSION;  Surgeon: Lesleigh Noe, MD;  Location: [facility] ENDOSCOPY;  Service: Cardiovascular;  Laterality: N/A;   CARDIOVERSION N/A 10/28/2015   Procedure: CARDIOVERSION;  Surgeon: Vesta Mixer, MD;  Location: [facility] ENDOSCOPY;  Service: Cardiovascular;  Laterality: N/A;   CARDIOVERSION  11/20/2015   "200 joules"   CARDIOVERSION N/A 07/02/2022   Procedure: CARDIOVERSION;  Surgeon: Jodelle Red, MD;  Location: [facility] ENDOSCOPY;  Service: Cardiovascular;  Laterality: N/A;   CARDIOVERSION N/A 11/10/2022   Procedure: CARDIOVERSION;  Surgeon: Thurmon Fair, MD;  Location: [facility] INVASIVE CV LAB;  Service: Cardiovascular;  Laterality:  N/A;   CORONARY ARTERY BYPASS GRAFT  05/2009   LIMA-LAD, SVG-OM, SVG-PDA 06/20/09   ELBOW FRACTURE SURGERY Left    broken ulna on left elbow-surgical repair   ELECTROPHYSIOLOGIC STUDY N/A 10/31/2014   Procedure: A-Flutter;  Surgeon: Marinus Maw, MD;  Location: [facility] INVASIVE CV LAB;  Service: Cardiovascular;  Laterality: N/A;   ELECTROPHYSIOLOGIC STUDY N/A 11/20/2015   Procedure: A-Flutter Ablation;  Surgeon: Marinus Maw, MD;  Location: [facility] INVASIVE CV LAB;  Service: Cardiovascular;  Laterality: N/A;   EYE SURGERY     FRACTURE SURGERY     INTRAOPERATIVE TRANSTHORACIC ECHOCARDIOGRAM N/A 06/02/2022   Procedure: INTRAOPERATIVE TRANSTHORACIC ECHOCARDIOGRAM;  Surgeon: Kathleene Hazel, MD;  Location: [facility] OR;  Service: Open Heart Surgery;  Laterality: N/A;   PACEMAKER IMPLANT N/A 06/03/2022   Procedure: PACEMAKER IMPLANT;  Surgeon: Regan Lemming, MD;  Location: [facility] INVASIVE CV LAB;  Service:  Cardiovascular;  Laterality: N/A;   RIGHT/LEFT HEART CATH AND CORONARY/GRAFT ANGIOGRAPHY N/A 04/13/2022   Procedure: RIGHT/LEFT HEART CATH AND CORONARY/GRAFT ANGIOGRAPHY;  Surgeon: Kathleene Hazel, MD;  Location: [facility] INVASIVE CV LAB;  Service: Cardiovascular;  Laterality: N/A;   TEE WITHOUT CARDIOVERSION N/A 03/13/2022   Procedure: TRANSESOPHAGEAL ECHOCARDIOGRAM (TEE);  Surgeon: Thurmon Fair, MD;  Location: [facility] ENDOSCOPY;  Service: Cardiovascular;  Laterality: N/A;   TESTICLE REMOVAL Right 1982   TONSILLECTOMY  ~ 1967   TRANSCATHETER AORTIC VALVE REPLACEMENT, TRANSFEMORAL N/A 06/02/2022   Procedure: Transcatheter Aortic Valve Replacement, Transfemoral;  Surgeon: Kathleene Hazel, MD;  Location: [facility] OR;  Service: Open Heart Surgery;  Laterality: N/A;    ALLERGIES: Allergies  Allergen Reactions   Actos [Pioglitazone] Swelling   Bee Venom Swelling   Codeine Hives and Nausea Only   Metformin Nausea Only   Statins Other (See Comments)    Muscle pain, fatigue   Raspberry Itching     FAMILY HISTORY:  Family History  Problem Relation Age of Onset   Diabetes Mother    Heart disease Mother        Heart murmur   Heart disease Father    Heart attack Father    Diabetes Father    Hypertension Father    Diabetes Sister     SOCIAL HISTORY: Social History   Socioeconomic History   Marital status: Married    Spouse name: Not on file   Number of children: 0   Years of education: Not on file   Highest education level: Not on file  Occupational History   Occupation: Insurance account manager  Tobacco Use   Smoking status: Former    Current packs/day: 0.00    Average packs/day: 0.5 packs/day for 15.0 years (7.5 ttl pk-yrs)    Types: Cigarettes    Start date: 3    Quit date: 1990    Years since quitting: 35.0   Smokeless tobacco: Never   Tobacco comments:    "quit smoking cigarettes in the 1980's; don't know how much or for how long"  Vaping Use   Vaping status: Never Used  Substance and Sexual Activity   Alcohol use: Yes    Alcohol/week: 3.0 standard drinks of alcohol    Types: 3 Cans of beer per week   Drug use: No    Comment: "quit smoking pot in the 1990s"   Sexual activity: Not Currently    Birth control/protection: Coitus interruptus  Other Topics Concern   Not on file  Social History Narrative   Not on file   Social Drivers of Health   Financial Resource Strain: Not on file  Food Insecurity: No Food Insecurity (06/05/2022)   Hunger Vital Sign    Worried About Running Out of Food in the Last Year: Never true    Ran Out of Food in the Last Year: Never true  Transportation Needs: No Transportation Needs (06/05/2022)   PRAPARE - Administrator, Civil Service (Medical): No    Lack of Transportation (Non-Medical): No  Physical Activity: Not on file  Stress: Not on file  Social Connections: Not on file    MEDICATIONS:  Current Outpatient Medications  Medication Sig Dispense Refill   apixaban (ELIQUIS) 5 MG TABS tablet Take 1 tablet (5  mg total) by mouth 2 (two) times daily. 60 tablet 11   BD VEO INSULIN SYRINGE U/F 31G X 15/64" 0.5 ML MISC USE TO INJECT 4 TIMES A DAY 400 each 3   Continuous  Blood Gluc Sensor (DEXCOM G7 SENSOR) MISC by Does not apply route.     cyanocobalamin (VITAMIN B12) 1000 MCG tablet Take 1,000 mcg by mouth daily.     dorzolamide-timolol (COSOPT) 22.3-6.8 MG/ML ophthalmic solution Place 1 drop into the left eye 2 (two) times daily.     furosemide (LASIX) 40 MG tablet Take 40 mg by mouth as needed for fluid or edema.     insulin NPH Human (NOVOLIN N) 100 UNIT/ML injection Inject 0.2-0.25 mLs (20-25 Units total) into the skin at bedtime. 22 mL 1   latanoprost (XALATAN) 0.005 % ophthalmic solution Place 1 drop into the left eye at bedtime.     lisinopril (ZESTRIL) 5 MG tablet TAKE 1 TABLET BY MOUTH EVERY DAY 90 tablet 3   metoprolol succinate (TOPROL-XL) 50 MG 24 hr tablet Take 50 mg by mouth 2 (two) times daily. Take with or immediately following a meal.     NOVOLOG 100 UNIT/ML injection INJECT 15-20 UNITS INTO THE SKIN 3 (THREE) TIMES DAILY WITH MEALS. SLIDING SCALE 30 mL 1   potassium chloride (KLOR-CON) 10 MEQ tablet TAKE 1 TABLET BY MOUTH EVERY DAY 90 tablet 1   Glucagon (GVOKE HYPOPEN 1-PACK) 1 MG/0.2ML SOAJ Inject 1 mg into the skin as needed (low blood sugar with impaired consciousness). 0.4 mL 2   Insulin Pen Needle (PEN NEEDLES) 32G X 4 MM MISC Use as instructed to inject insulin 100 each 3   No current facility-administered medications for this visit.    PHYSICAL EXAM: Vitals:   05/18/23 0945  BP: 136/80  Pulse: 87  Resp: 20  SpO2: 97%  Weight: 200 lb (90.7 kg)  Height: 5\' 4"  (1.626 m)    Body mass index is 34.33 kg/m.  Wt Readings from Last 3 Encounters:  05/18/23 200 lb (90.7 kg)  02/15/23 197 lb 3.2 oz (89.4 kg)  01/21/23 196 lb (88.9 kg)    General: Well developed, well nourished male in no apparent distress.  HEENT: AT/[place], no external lesions.  Eyes: Conjunctiva clear and no  icterus. Neck: Neck supple  Lungs: Respirations not labored Neurologic: Alert, oriented, normal speech Extremities / Skin: Dry.   Psychiatric: Does not appear depressed or anxious  Diabetic Foot Exam - Simple   Simple Foot Form Diabetic Foot exam was performed with the following findings: Yes 05/18/2023 10:14 AM  Visual Inspection No deformities, no ulcerations, no other skin breakdown bilaterally: Yes Sensation Testing Intact to touch and monofilament testing bilaterally: Yes Pulse Check See comments: Yes Comments DP palpable bilaterally.     LABS Reviewed Lab Results  Component Value Date   HGBA1C 7.9 (A) 05/18/2023   HGBA1C 8.0 (A) 02/15/2023   HGBA1C 7.8 (A) 06/24/2022   Lab Results  Component Value Date   FRUCTOSAMINE 337 (H) 10/11/2019   FRUCTOSAMINE 321 (H) 03/07/2018   FRUCTOSAMINE 381 (H) 12/10/2015   Lab Results  Component Value Date   CHOL 228 (H) 07/07/2021   HDL 45.40 07/07/2021   LDLCALC 147 (H) 07/07/2021   TRIG 177.0 (H) 07/07/2021   CHOLHDL 5 07/07/2021   Lab Results  Component Value Date   MICRALBCREAT 11.6 12/17/2021   MICRALBCREAT 4.5 10/29/2020   Lab Results  Component Value Date   CREATININE 1.09 01/19/2023   Lab Results  Component Value Date   GFR 66.60 10/23/2021    ASSESSMENT / PLAN  1. Uncontrolled type 2 diabetes mellitus with hyperglycemia, with long-term current use of insulin (HCC)      Diabetes Mellitus  type 2, complicated by diabetic retinopathy/CAD. - Diabetic status / severity: Uncontrolled.  Lab Results  Component Value Date   HGBA1C 7.9 (A) 05/18/2023    - Hemoglobin A1c goal : <7%  Discussed about long-acting basal insulin like Tresiba.  He reports NPH insulin works better for him.  He prefers to be on insulin vial.  He also prefers to mix NPH insulin with the NovoLog.  Again discussed today.  Due to variability of blood sugar I would like to check type I autoimmune panel, with the next set of lab.  -  Medications: Adjusted diabetes regimen as follows.  Advised to stay on fixed dose of Novolin N and unless blood sugar is significantly low or significantly high.  NovoLog can be adjusted based on meal size and blood sugar, discussed in detail.  Added different lighter sliding scale for bedtime to correct hypoglycemia as follows.  Discussed that give 1 unit of NovoLog for every 50 of glucose for blood sugar more than 250, not more to prevent overnight or early morning hypoglycemia.  Diabetes regimen:  Novolin N 15 units with breakfast and 10 units with supper ( not at bedtime).  He has been taking at bedtime at this time.  Novolog 5-10 units with meals three times a day plus sliding scale.  Take 15 minutes before eating.  Discussed about especially focusing for lunch NovoLog coverage.  Moderate Sliding Scale Blood Glucose        Insulin 60-150                     None 151-200                   3 units 201-250                   5 units 251-300                   7 units 301-350                   9 units 351-400                  11 units    >400                       12 units and call provider   Bedtime: Do not take Novolin N at bedtime. Can correct for high blood sugar with sliding scale with NovoLog as follows.  Mild Sliding Scale Blood Glucose        Insulin 60-150                     None 151-200                   None 201-250                   0 units 251-300                   1 units 301-350                   2 units 351-400                   3 units      >400  5 units and call provider   - Home glucose testing: Dexcom G7 and check as needed.  Discussed about advantages of using Dexcom CGM to regulate his meals and definitely to monitor blood sugar. - Discussed/ Gave Hypoglycemia treatment plan.  # Consult : not required at this time.   # Annual urine for microalbuminuria/ creatinine ratio, no microalbuminuria currently, continue Matthew/ARB  /lisinopril.  Asked to check urine microalbumin creatinine ratio today, patient not able to provide sample.  Will check in next set of lab. Last  Lab Results  Component Value Date   MICRALBCREAT 11.6 12/17/2021    # Foot check nightly.  # Patient has diabetic retinopathy, following with ophthalmology Kari Baars specialist regularly.   - Diet: Make healthy diabetic food choices - Life style / activity / exercise: Discussed.  2. Blood pressure  -  BP Readings from Last 1 Encounters:  05/18/23 136/80    - Control is  in target.  - No change in current plans.    3. Lipid status / Hyperlipidemia - Last  Lab Results  Component Value Date   LDLCALC 147 (H) 07/07/2021   -Patient not on a statin.  Will check lipid panel in next set of lab.  Patient denies being on statin therapy..  Diagnoses and all orders for this visit:  Uncontrolled type 2 diabetes mellitus with hyperglycemia, with long-term current use of insulin (HCC) -     POCT glycosylated hemoglobin (Hb A1C) -     Lipid panel -     Microalbumin / creatinine urine ratio -     Hemoglobin A1c -     Basic metabolic panel -     C-peptide -     ZNT8 Antibodies -     IA-2 Autoantibodies -     Glutamic acid decarboxylase auto abs      DISPOSITION Follow up in clinic in 3 months suggested.  Labs prior to follow-up visit.   All questions answered and patient verbalized understanding of the plan.  [place] Jamilah Jean, MD [facility] Endocrinology [facility] [address] [place], [address] [place], [place] [address] Phone # [phone]  At least part of this note was generated using voice recognition software. Inadvertent word errors may have occurred, which were not recognized during the proofreading process.

## 2023-05-21 LAB — GENECONNECT MOLECULAR SCREEN: Genetic Analysis Overall Interpretation: NEGATIVE

## 2023-05-24 ENCOUNTER — Encounter: Payer: Self-pay | Admitting: Ophthalmology

## 2023-05-24 DIAGNOSIS — E113511 Type 2 diabetes mellitus with proliferative diabetic retinopathy with macular edema, right eye: Secondary | ICD-10-CM | POA: Diagnosis not present

## 2023-05-24 DIAGNOSIS — H35352 Cystoid macular degeneration, left eye: Secondary | ICD-10-CM | POA: Diagnosis not present

## 2023-05-24 DIAGNOSIS — H211X2 Other vascular disorders of iris and ciliary body, left eye: Secondary | ICD-10-CM | POA: Diagnosis not present

## 2023-05-24 LAB — HM DIABETES EYE EXAM

## 2023-06-11 ENCOUNTER — Ambulatory Visit: Payer: Medicare HMO | Attending: Cardiology

## 2023-06-11 DIAGNOSIS — I442 Atrioventricular block, complete: Secondary | ICD-10-CM

## 2023-06-12 LAB — CUP PACEART REMOTE DEVICE CHECK
Battery Remaining Longevity: 115 mo
Battery Remaining Percentage: 95 %
Battery Voltage: 3.02 V
Brady Statistic AP VP Percent: 1 %
Brady Statistic AP VS Percent: 15 %
Brady Statistic AS VP Percent: 1 %
Brady Statistic AS VS Percent: 84 %
Brady Statistic RA Percent Paced: 13 %
Brady Statistic RV Percent Paced: 2.4 %
Date Time Interrogation Session: 20250214025349
Implantable Lead Connection Status: 753985
Implantable Lead Connection Status: 753985
Implantable Lead Implant Date: 20240207
Implantable Lead Implant Date: 20240207
Implantable Lead Location: 753859
Implantable Lead Location: 753860
Implantable Pulse Generator Implant Date: 20240207
Lead Channel Impedance Value: 400 Ohm
Lead Channel Impedance Value: 580 Ohm
Lead Channel Pacing Threshold Amplitude: 0.5 V
Lead Channel Pacing Threshold Amplitude: 0.75 V
Lead Channel Pacing Threshold Pulse Width: 0.5 ms
Lead Channel Pacing Threshold Pulse Width: 0.5 ms
Lead Channel Sensing Intrinsic Amplitude: 12 mV
Lead Channel Sensing Intrinsic Amplitude: 2.1 mV
Lead Channel Setting Pacing Amplitude: 2 V
Lead Channel Setting Pacing Amplitude: 2.5 V
Lead Channel Setting Pacing Pulse Width: 0.5 ms
Lead Channel Setting Sensing Sensitivity: 2 mV
Pulse Gen Model: 2272
Pulse Gen Serial Number: 8154174

## 2023-06-18 ENCOUNTER — Ambulatory Visit (HOSPITAL_COMMUNITY): Payer: Medicare HMO

## 2023-06-18 ENCOUNTER — Ambulatory Visit: Payer: Medicare HMO

## 2023-06-28 ENCOUNTER — Ambulatory Visit: Payer: Self-pay | Admitting: Family Medicine

## 2023-06-28 DIAGNOSIS — H211X2 Other vascular disorders of iris and ciliary body, left eye: Secondary | ICD-10-CM | POA: Diagnosis not present

## 2023-06-28 DIAGNOSIS — H3412 Central retinal artery occlusion, left eye: Secondary | ICD-10-CM | POA: Diagnosis not present

## 2023-06-28 DIAGNOSIS — E113512 Type 2 diabetes mellitus with proliferative diabetic retinopathy with macular edema, left eye: Secondary | ICD-10-CM | POA: Diagnosis not present

## 2023-06-28 DIAGNOSIS — E113511 Type 2 diabetes mellitus with proliferative diabetic retinopathy with macular edema, right eye: Secondary | ICD-10-CM | POA: Diagnosis not present

## 2023-06-28 DIAGNOSIS — H401131 Primary open-angle glaucoma, bilateral, mild stage: Secondary | ICD-10-CM | POA: Diagnosis not present

## 2023-06-28 DIAGNOSIS — H35342 Macular cyst, hole, or pseudohole, left eye: Secondary | ICD-10-CM | POA: Diagnosis not present

## 2023-06-28 LAB — HM DIABETES EYE EXAM

## 2023-06-28 NOTE — Telephone Encounter (Signed)
 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 I wait listed pt 07/01/23 at 11:40 am. Waiting for approval.

## 2023-06-28 NOTE — Telephone Encounter (Signed)
 Red Word that prompted transfer to Nurse Triage: Possible Stroke    Chief Complaint: Pt. Saw eye doctor today, Dr. Luciana Axe. 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 Vision is gone and I need to see Dr. Chilton Si in 1-2 days for work up." Erie Noe in the practice made appointment for 07/01/23 11:40.  Symptoms: Loss of vision Frequency: Today Pertinent Negatives: Patient denies any symptoms. Disposition: [] ED /[] Urgent Care (no appt availability in office) / [x] Appointment(In office/virtual)/ []  [place] [facility]/ [] Home Care/ [] Refused Recommended Disposition /[]  Mobile Bus/ []  Follow-up with PCP Additional Notes: Appointment made per Erie Noe in the practice.  Reason for Disposition  [1] Brief (now gone) blurred vision AND [2] unexplained  Answer Assessment - Initial Assessment Questions 1. DESCRIPTION: "How has your vision changed?" (e.g., complete vision loss, blurred vision, double vision, floaters, etc.)     Loss of vision 2. LOCATION: "One or both eyes?" If one, ask: "Which eye?"     Left eye 3. SEVERITY: "Can you see anything?" If Yes, ask: "What can you see?" (e.g., fine print)     No 4. ONSET: "When did this begin?" "Did it start suddenly or has this been gradual?"     At eye doctor now 5. PATTERN: "Does this come and go, or has it been constant since it started?"     Constant 6. PAIN: "Is there any pain in your eye(s)?"  (Scale 1-10; or mild, moderate, severe)   - NONE (0): No pain.   - MILD (1-3): Doesn't interfere with normal activities.   - MODERATE (4-7): Interferes with normal activities or awakens from sleep.    - SEVERE (8-10): Excruciating pain, unable to do any normal activities.     No 7. CONTACTS-GLASSES: "Do you wear contacts or glasses?"     No 8. CAUSE: "What do you think is causing this visual problem?"     I had a stroke in my eye 9. OTHER SYMPTOMS: "Do you have any other symptoms?" (e.g., confusion, headache, arm or leg weakness, speech problems)      No 10. PREGNANCY: "Is there any chance you are pregnant?" "When was your last menstrual period?"       N/a  Protocols used: Vision Loss or Change-A-AH

## 2023-06-28 NOTE — Telephone Encounter (Signed)
 Noted.  Please have him come in at 1120 and block out the 1140 to allow extra time for evaluation of this condition.  Please make sure we get the notes from Dr. Luciana Axe to have available for that appointment.  If he has any new symptoms prior to that time needs to be seen in ER.

## 2023-06-29 NOTE — Telephone Encounter (Signed)
 Called Dr. Ephriam Knuckles office and they are sending patient's note from 3/3 to [place].

## 2023-06-29 NOTE — Telephone Encounter (Signed)
 Called patient to verify he could come in at 11:20am instead of 11:40am on 07/01/2023. Patient stated he could be here at that time and did advise per Dr. Neva Seat to be seen sooner in the ER if symptoms seen to worsen. Patient verbalized understanding. Did change patients appointment to 11:20am with a 40 minute long visit.

## 2023-06-29 NOTE — Telephone Encounter (Signed)
 Per Dr.Greene,   Please have him come in at 1120 and block out the 1140 to allow extra time for evaluation of this condition.

## 2023-06-30 ENCOUNTER — Encounter: Payer: Self-pay | Admitting: Family Medicine

## 2023-07-01 ENCOUNTER — Ambulatory Visit: Admitting: Family Medicine

## 2023-07-01 ENCOUNTER — Encounter: Payer: Self-pay | Admitting: Family Medicine

## 2023-07-01 VITALS — BP 130/62 | HR 76 | Temp 98.0°F | Ht 64.0 in | Wt 196.2 lb

## 2023-07-01 DIAGNOSIS — Z860100 Personal history of colon polyps, unspecified: Secondary | ICD-10-CM | POA: Insufficient documentation

## 2023-07-01 DIAGNOSIS — H3412 Central retinal artery occlusion, left eye: Secondary | ICD-10-CM

## 2023-07-01 DIAGNOSIS — I4891 Unspecified atrial fibrillation: Secondary | ICD-10-CM

## 2023-07-01 DIAGNOSIS — D509 Iron deficiency anemia, unspecified: Secondary | ICD-10-CM | POA: Insufficient documentation

## 2023-07-01 DIAGNOSIS — D649 Anemia, unspecified: Secondary | ICD-10-CM | POA: Diagnosis not present

## 2023-07-01 DIAGNOSIS — E785 Hyperlipidemia, unspecified: Secondary | ICD-10-CM

## 2023-07-01 DIAGNOSIS — H5462 Unqualified visual loss, left eye, normal vision right eye: Secondary | ICD-10-CM | POA: Diagnosis not present

## 2023-07-01 DIAGNOSIS — I2510 Atherosclerotic heart disease of native coronary artery without angina pectoris: Secondary | ICD-10-CM | POA: Insufficient documentation

## 2023-07-01 DIAGNOSIS — I38 Endocarditis, valve unspecified: Secondary | ICD-10-CM | POA: Insufficient documentation

## 2023-07-01 LAB — LIPID PANEL
Cholesterol: 184 mg/dL (ref 0–200)
HDL: 42.9 mg/dL (ref >39.00)
LDL Cholesterol: 123 mg/dL — ABNORMAL HIGH (ref 0–99)
NonHDL: 141.42
Total CHOL/HDL Ratio: 4
Triglycerides: 90 mg/dL (ref 0.0–149.0)
VLDL: 18 mg/dL (ref 0.0–40.0)

## 2023-07-01 LAB — CBC
HCT: 37.1 % — ABNORMAL LOW (ref 39.0–52.0)
Hemoglobin: 12.2 g/dL — ABNORMAL LOW (ref 13.0–17.0)
MCHC: 32.9 g/dL (ref 30.0–36.0)
MCV: 86.3 fl (ref 78.0–100.0)
Platelets: 255 10*3/uL (ref 150.0–400.0)
RBC: 4.3 Mil/uL (ref 4.22–5.81)
RDW: 15.5 % (ref 11.5–15.5)
WBC: 8 10*3/uL (ref 4.0–10.5)

## 2023-07-01 LAB — C-REACTIVE PROTEIN: CRP: <1 mg/dL (ref 0.5–20.0)

## 2023-07-01 LAB — SEDIMENTATION RATE: Sed Rate: 59 mm/h — ABNORMAL HIGH (ref 0–20)

## 2023-07-01 NOTE — Patient Instructions (Addendum)
 I will check labs as recommended by your ophthalmologist, and I have also ordered the carotid Dopplers to look at any blockages in the neck arteries that may have contributed to the recent event in your eye.  You do have an echocardiogram next week and follow-up with cardiology next week and they can perform them EKG at that time if needed. I would recommend discussing PCSK9 medicine for cholesterol again since you were unable to tolerate statins.   Keep follow-up with your ophthalmologist as planned.  If any concerns on labs I will let you know.  I will need to look into the MRI of the brain with your previous procedures to see if that is able to be done. If any concerns on the studies I will let you know.  Follow-up with me in the next few weeks to review these results and your chronic medical conditions.  Thank you for coming in today.  Return to the clinic or go to the nearest emergency room if any of your symptoms worsen or new symptoms occur.

## 2023-07-01 NOTE — Progress Notes (Signed)
 Subjective:  Patient ID: Matthew [name], male    DOB: 09-13-1945  Age: 78 y.o. MRN: [id number]  CC:  Chief Complaint  Patient presents with   Eye Problem    Received report from specialist about Stroke in the Lt eye, pt is doing okay and reports no questions at this time    Health Maintenance    Patient declined Shingles vaccine and Hep C screening today, notes he may be able to produce a urine for Micro test, was not interested in colon cancer screening at this time, notes he would like to get through current concerns first     HPI Matthew [name] presents for acute visit after recent ophthalmology visit.  Last visit in May 2024.  11-month follow-up was recommended at that time.  Central retinal artery occlusion OS Notes reviewed from his ophthalmologist, with visit on 06/28/2023.  Loss of vision OS for 2 to 3 weeks prior.  He was diagnosed with a central retinal artery occlusion, anterior chamber tap performed in conjunction with a Avastin to attempt to help reperfuse a central retinal artery.  Follow-up with me today to arrange for nonemergent workup for recent stroke of the left eye including carotid Dopplers, echo, sed rate. CRP, CBC, MRA brain. Has pacemaker, MRI lumbar spine last year without issue.   He does have a history of diabetes, treated by endocrinology, Dr. Erroll Luna, insulin-dependent.  Recent A1c in January. Lab Results  Component Value Date   HGBA1C 7.9 (A) 05/18/2023  He is not currently on statin, statin allergy with muscle pain and fatigue. PCSK9 inhibitors discussed in past - decide against.  Lab Results  Component Value Date   CHOL 228 (H) 07/07/2021   HDL 45.40 07/07/2021   LDLCALC 147 (H) 07/07/2021   TRIG 177.0 (H) 07/07/2021   CHOLHDL 5 07/07/2021   He is followed by cardiology, electrophysiology with history of atrial fibrillation, on chronic anticoagulation with Eliquis.  Dr. Elberta Fortis.  History of flutter ablation in 2016, PVI ablation 2018, DC  cardioversion 11/10/2022.  Sinus rhythm at his 11/26/2022 A-fib office visit.  Was continued on Eliquis 5 mg twice daily, Toprol 50 mg twice daily.  CAD status post CABG in 2011, aortic stenosis status post TAVR, Dr. Clifton James, and chronic systolic, diastolic CHF with EF 40 to 45% previously (echo March 2024 ) intermittent CHB status post PPM followed by Dr. Elberta Fortis. Echo scheduled on March 12 by cardiology, appt with cardiology on 3/14.  Carotid ultrasound in 2018,Heterogeneous plaque bilaterally, 1 to 39% bilateral ICA stenosis, normal subclavian arteries bilaterally and patent vertebral arteries with antegrade flow. Cardiology appt next week.    Has been followed by hematology with history of normocytic anemia, Dr. Leonides Schanz. Appt 3/26.   No recent HA or jaw pain.  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   Severe aortic stenosis 04/13/2022  Aortic stenosis 03/27/2022   Nonrheumatic aortic valve stenosis 03/13/2022   Mitral valve stenosis, non-rheumatic 03/13/2022   Myopathy, unspecified 07/09/2020   Statin myopathy 09/15/2019   Atrial flutter (HCC) 10/31/2014   Coronary artery disease    Paroxysmal atrial flutter (HCC)    Diabetes mellitus without complication (HCC)    Valvular heart disease    Atrial fibrillation (HCC) 01/25/2014   Diabetes mellitus, type 2 (HCC) 01/16/2014   Hyperlipidemia  01/16/2014   Erectile dysfunction associated with type 2 diabetes mellitus (HCC) 01/16/2014   Coronary atherosclerosis of native coronary artery 06/30/2013   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 05/2009 CABG x 3: LIMA->LAD, VG->OM, VG->PDA; b. Nuc 01/2014: inf-lat scar but no ischemia, EF 57%.   Diabetic retinopathy (HCC)    mild- Dr. [name]   Glaucoma    Hyperlipidemia    a. Intolerant of lipitor and vytorin.   Lyme disease    Migraine    "once or twice" (11/20/2015)   Myocardial infarction [facility])    'saw evidence of it on an EKG done in 2005"   Paroxysmal atrial flutter (HCC)    a. 01/2014 s/p DCCV;  b. CHA2DS2VASc = 3-->Eliquis.   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 Clifton James and Dr. Laneta Simmers   Sacral pain    "right"   Sciatica of right side    Testicular cancer (HCC) 1982   "heavy doses of radiation; it was stage 2"   Type II diabetes mellitus (HCC)    Valvular heart disease    a. 01/2014 Echo: Ef 60-65%, no rwma, mild AI/MS, mod MR, mildly dil LA.   Past Surgical History:  Procedure Laterality Date   ABDOMINAL EXPLORATION SURGERY  ~ 2005   "for hernia, but didn't have one"   ATRIAL FIBRILLATION ABLATION N/A 09/17/2016   Procedure: Atrial Fibrillation Ablation;  Surgeon: Regan Lemming, MD;  Location: [facility] INVASIVE CV LAB;  Service: Cardiovascular;  Laterality: N/A;   CARDIAC CATHETERIZATION  2011   CARDIOVERSION N/A 02/19/2014   Procedure: CARDIOVERSION;  Surgeon: Lesleigh Noe, MD;  Location: [facility] ENDOSCOPY;  Service: Cardiovascular;  Laterality: N/A;   CARDIOVERSION N/A 10/28/2015   Procedure: CARDIOVERSION;  Surgeon: Vesta Mixer, MD;   Location: [facility] ENDOSCOPY;  Service: Cardiovascular;  Laterality: N/A;   CARDIOVERSION  11/20/2015   "200 joules"   CARDIOVERSION N/A 07/02/2022   Procedure: CARDIOVERSION;  Surgeon: Jodelle Red, MD;  Location: [facility] ENDOSCOPY;  Service: Cardiovascular;  Laterality: N/A;   CARDIOVERSION N/A 11/10/2022   Procedure: CARDIOVERSION;  Surgeon: Thurmon Fair, MD;  Location: [facility] INVASIVE CV LAB;  Service: Cardiovascular;  Laterality: N/A;   CORONARY ARTERY BYPASS GRAFT  05/2009   LIMA-LAD, SVG-OM, SVG-PDA 06/20/09   ELBOW FRACTURE SURGERY Left    broken ulna on left elbow-surgical repair   ELECTROPHYSIOLOGIC STUDY N/A 10/31/2014   Procedure: A-Flutter;  Surgeon: Marinus Maw, MD;  Location: [facility] INVASIVE CV LAB;  Service: Cardiovascular;  Laterality: N/A;   ELECTROPHYSIOLOGIC STUDY N/A 11/20/2015   Procedure: A-Flutter Ablation;  Surgeon: Marinus Maw, MD;  Location: [facility] INVASIVE CV LAB;  Service: Cardiovascular;  Laterality: N/A;   EYE SURGERY     FRACTURE SURGERY     INTRAOPERATIVE TRANSTHORACIC ECHOCARDIOGRAM N/A 06/02/2022   Procedure: INTRAOPERATIVE TRANSTHORACIC ECHOCARDIOGRAM;  Surgeon: Kathleene Hazel, MD;  Location: [facility] OR;  Service: Open Heart Surgery;  Laterality: N/A;   PACEMAKER IMPLANT N/A 06/03/2022   Procedure: PACEMAKER IMPLANT;  Surgeon: Regan Lemming, MD;  Location: [facility] INVASIVE CV LAB;  Service: Cardiovascular;  Laterality: N/A;   RIGHT/LEFT HEART CATH AND CORONARY/GRAFT ANGIOGRAPHY N/A 04/13/2022   Procedure: RIGHT/LEFT HEART CATH AND CORONARY/GRAFT ANGIOGRAPHY;  Surgeon: Kathleene Hazel, MD;  Location: [facility] INVASIVE CV LAB;  Service: Cardiovascular;  Laterality: N/A;   TEE WITHOUT CARDIOVERSION N/A 03/13/2022   Procedure: TRANSESOPHAGEAL ECHOCARDIOGRAM (TEE);  Surgeon: Thurmon Fair, MD;  Location: [facility] ENDOSCOPY;  Service: Cardiovascular;  Laterality: N/A;   TESTICLE REMOVAL Right 1982   TONSILLECTOMY  ~ 1967   TRANSCATHETER AORTIC VALVE REPLACEMENT,  TRANSFEMORAL N/A 06/02/2022   Procedure: Transcatheter Aortic Valve Replacement, Transfemoral;  Surgeon: Kathleene Hazel, MD;  Location: [facility] OR;  Service: Open Heart Surgery;  Laterality: N/A;   Allergies  Allergen Reactions   Bee Venom Swelling and Other (See Comments)    honey bee venom   Codeine Hives, Nausea Only and Rash    codeine   Pioglitazone Swelling and Other (See Comments)    pioglitazone   Metformin Nausea Only   Statins Other (See Comments)    Muscle pain, fatigue   Raspberry Itching and Hives   Prior to Admission medications   Medication Sig Start Date End Date Taking? Authorizing Provider  apixaban (ELIQUIS) 5 MG TABS tablet Take 1 tablet (5 mg total) by mouth 2 (two) times daily. 06/08/22  Yes Georgie Chard D, NP  BD VEO INSULIN SYRINGE U/F 31G X 15/64" 0.5 ML MISC USE TO INJECT 4 TIMES A DAY 11/03/22  Yes Reather Littler, MD  Continuous Blood Gluc Sensor (DEXCOM G7 SENSOR) MISC by Does not apply route.   Yes [provider]  cyanocobalamin (VITAMIN B12) 1000 MCG tablet Take 1,000 mcg by mouth daily.   Yes [provider]  dorzolamide-timolol (COSOPT) 22.3-6.8 MG/ML ophthalmic solution Place 1 drop into the left eye 2 (two) times daily. 10/01/21  Yes [provider]  furosemide (LASIX) 40 MG tablet Take 40 mg by mouth as needed for fluid or edema.   Yes [provider]  insulin NPH Human (NOVOLIN N) 100 UNIT/ML injection Inject 0.2-0.25 mLs (20-25 Units total) into the skin at bedtime. 05/03/23  Yes Thapa, [place], MD  lisinopril (ZESTRIL) 5 MG tablet TAKE 1 TABLET BY MOUTH EVERY DAY 09/16/22  Yes Camnitz, Will Daphine Deutscher, MD  NOVOLOG 100 UNIT/ML injection INJECT 15-20 UNITS INTO THE SKIN 3 (THREE) TIMES DAILY WITH MEALS. SLIDING SCALE 11/17/22  Yes Reather Littler, MD  potassium chloride (KLOR-CON) 10 MEQ tablet TAKE 1 TABLET BY MOUTH EVERY DAY 04/23/23  Yes [name], MD   Social History   Socioeconomic History   Marital status: Married     Spouse name: Not on file   Number of children: 0   Years of education: Not on file   Highest education level: Bachelor's degree (e.g., BA, AB, BS)  Occupational History   Occupation: Insurance account manager  Tobacco Use   Smoking status: Former    Current packs/day: 0.00    Average packs/day: 0.5 packs/day for 15.0 years (7.5 ttl pk-yrs)    Types: Cigarettes    Start date: 65    Quit date: 1990  Years since quitting: 35.2   Smokeless tobacco: Never   Tobacco comments:    "quit smoking cigarettes in the 1980's; don't know how much or for how long"  Vaping Use   Vaping status: Never Used  Substance and Sexual Activity   Alcohol use: Yes    Alcohol/week: 3.0 standard drinks of alcohol    Types: 3 Cans of beer per week   Drug use: No    Comment: "quit smoking pot in the 1990s"   Sexual activity: Not Currently    Birth control/protection: Coitus interruptus  Other Topics Concern   Not on file  Social History Narrative   Not on file   Social Drivers of Health   Financial Resource Strain: Patient Declined (06/30/2023)   Overall Financial Resource Strain (CARDIA)    Difficulty of Paying Living Expenses: Patient declined  Food Insecurity: Patient Declined (06/30/2023)   Hunger Vital Sign    Worried About Running Out of Food in the Last Year: Patient declined    Ran Out of Food in the Last Year: Patient declined  Transportation Needs: No Transportation Needs (06/30/2023)   PRAPARE - Administrator, Civil Service (Medical): No    Lack of Transportation (Non-Medical): No  Physical Activity: Unknown (06/30/2023)   Exercise Vital Sign    Days of Exercise per Week: 0 days    Minutes of Exercise per Session: Not on file  Stress: No Stress Concern Present (06/30/2023)   [employer] of Occupational Health - Occupational Stress Questionnaire    Feeling of Stress : Not at all  Social Connections: Unknown (06/30/2023)   Social Connection and Isolation Panel [NHANES]    Frequency  of Communication with Friends and Family: More than three times a week    Frequency of Social Gatherings with Friends and Family: Three times a week    Attends Religious Services: Patient declined    Active Member of Clubs or Organizations: No    Attends Banker Meetings: Not on file    Marital Status: Married  Intimate Partner Violence: Not At Risk (03/28/2022)   Humiliation, Afraid, Rape, and Kick questionnaire    Fear of Current or Ex-Partner: No    Emotionally Abused: No    Physically Abused: No    Sexually Abused: No    Review of Systems   Objective:   Vitals:   07/01/23 1112  BP: 130/62  Pulse: 76  Temp: 98 F (36.7 C)  TempSrc: Temporal  SpO2: 97%  Weight: 196 lb 3.2 oz (89 kg)  Height: 5\' 4"  (1.626 m)     Physical Exam Vitals reviewed.  Constitutional:      Appearance: He is well-developed.  HENT:     Head: Normocephalic and atraumatic.  Neck:     Vascular: No carotid bruit or JVD.     Comments: No apparent carotid bruit. Cardiovascular:     Rate and Rhythm: Normal rate and regular rhythm.     Heart sounds: Murmur (Prominent S2, with slight splitting of S2.) heard.  Pulmonary:     Effort: Pulmonary effort is normal.     Breath sounds: Normal breath sounds. No rales.  Musculoskeletal:     Right lower leg: No edema.     Left lower leg: No edema.  Skin:    General: Skin is warm and dry.  Neurological:     Mental Status: He is alert and oriented to person, place, and time.     GCS: GCS eye subscore is  4. GCS verbal subscore is 5. GCS motor subscore is 6.     Cranial Nerves: No cranial nerve deficit, dysarthria or facial asymmetry.     Motor: No weakness or pronator drift.     Coordination: Romberg sign negative. Coordination normal. Finger-Nose-Finger Test and Heel to [facility] Test normal. Rapid alternating movements normal.     Gait: Gait is intact.  Psychiatric:        Mood and Affect: Mood normal.    48 minutes spent during visit,  including chart review, review of ophthalmology records, chart review regarding previous studies including echo, labs, counseling and assimilation of information, exam, discussion of plan, and chart completion.   Assessment & Plan:  Cyle L Weese [name] is a 78 y.o. male . Central retinal artery occlusion of left eye - Plan: CBC, Sedimentation rate, C-reactive protein, MR Angiogram Head Wo Contrast, US Carotid Bilateral  Vision loss, left eye - Plan: CBC, Sedimentation rate, C-reactive protein, MR Angiogram Head Wo Contrast  Normocytic anemia - Plan: CBC  Atrial fibrillation, unspecified type (HCC) - Plan: MR Angiogram Head Wo Contrast  Hyperlipidemia, unspecified hyperlipidemia type - Plan: Lipid panel  Unfortunately recent left-sided central retinal artery occlusion.  Will check for underlying conditions that may have predisposed him to this, follow-up with his eye specialist and cardiology.  Nonfocal neuroexam otherwise.  -Check sed rate, CRP to rule out giant cell/temporal arteritis, no current symptoms.  -Check lipids, intolerant to statins, recommended discussing PCSK9 inhibitor again with cardiology, has appointment next week.    -Echo planned next week as well as cardiology follow-up, EKG can be obtained at that time as needed.  -Check updated carotid ultrasound, nonobstructive plaque noted on previous studies  -Check MRI brain  -Continue close follow-up with endocrinology for diabetes control.  -Blood pressure stable in office at this time.  -3-week follow-up.  ER precautions.   No orders of the defined types were placed in this encounter.  Patient Instructions  I will check labs as recommended by your ophthalmologist, and I have also ordered the carotid Dopplers to look at any blockages in the neck arteries that may have contributed to the recent event in your eye.  You do have an echocardiogram next week and follow-up with cardiology next week and they can perform them EKG at  that time if needed. I would recommend discussing PCSK9 medicine for cholesterol again since you were unable to tolerate statins.   Keep follow-up with your ophthalmologist as planned.  If any concerns on labs I will let you know.  I will need to look into the MRI of the brain with your previous procedures to see if that is able to be done. If any concerns on the studies I will let you know.  Follow-up with me in the next few weeks to review these results and your chronic medical conditions.  Thank you for coming in today.  Return to the clinic or go to the nearest emergency room if any of your symptoms worsen or new symptoms occur.     Signed,   Meredith Staggers, MD [facility], [facility] 07/01/23 12:54 PM

## 2023-07-03 ENCOUNTER — Encounter: Payer: Self-pay | Admitting: Family Medicine

## 2023-07-05 ENCOUNTER — Encounter: Payer: Self-pay | Admitting: Family Medicine

## 2023-07-07 ENCOUNTER — Ambulatory Visit (HOSPITAL_COMMUNITY): Payer: Medicare HMO | Attending: Cardiology

## 2023-07-07 ENCOUNTER — Encounter: Payer: Self-pay | Admitting: Cardiology

## 2023-07-07 DIAGNOSIS — Z952 Presence of prosthetic heart valve: Secondary | ICD-10-CM | POA: Diagnosis not present

## 2023-07-07 LAB — ECHOCARDIOGRAM COMPLETE
AR max vel: 3.03 cm2
AV Area VTI: 3.06 cm2
AV Area mean vel: 2.93 cm2
AV Mean grad: 6 mmHg
AV Peak grad: 9.3 mmHg
Ao pk vel: 1.53 m/s
Area-P 1/2: 2.73 cm2
Calc EF: 58.9 %
MV M vel: 5.81 m/s
MV Peak grad: 135 mmHg
MV VTI: 1.88 cm2
Radius: 0.5 cm
S' Lateral: 3.55 cm
Single Plane A2C EF: 59.8 %
Single Plane A4C EF: 59.3 %

## 2023-07-07 MED ORDER — PERFLUTREN LIPID MICROSPHERE
1.0000 mL | INTRAVENOUS | Status: AC | PRN
Start: 2023-07-07 — End: 2023-07-07
  Administered 2023-07-07: 3 mL via INTRAVENOUS

## 2023-07-08 ENCOUNTER — Ambulatory Visit
Admission: RE | Admit: 2023-07-08 | Discharge: 2023-07-08 | Disposition: A | Discharge status: Home / Self Care | Source: Ambulatory Visit | Attending: Family Medicine | Admitting: Family Medicine

## 2023-07-08 ENCOUNTER — Telehealth: Payer: Self-pay

## 2023-07-08 DIAGNOSIS — Z952 Presence of prosthetic heart valve: Secondary | ICD-10-CM

## 2023-07-08 DIAGNOSIS — H3412 Central retinal artery occlusion, left eye: Secondary | ICD-10-CM

## 2023-07-08 DIAGNOSIS — I6522 Occlusion and stenosis of left carotid artery: Secondary | ICD-10-CM | POA: Diagnosis not present

## 2023-07-08 NOTE — Telephone Encounter (Signed)
-----   Message from Armanda Magic sent at 07/07/2023  4:47 PM EDT ----- 2D echo showed low normal LVF , there is increase stiffness of the heart called diastolic dysfunction normal for age, mild LEE enlarged right ventricle with mildly reduced right ventricular function, mildly enlarged atria, mild to moderate mitral stenosis and mild leakiness of the mitral valve.  There is a stable TAVR with a normal mean TAVR gradient of 6 mmHg.  Please repeat echo in 1 year for mild to moderate mitral stenosis

## 2023-07-08 NOTE — Telephone Encounter (Signed)
 The patient has been notified of the result and verbalized understanding.  All questions (if any) were answered. Frutoso Schatz, RN 07/08/2023 9:32 AM

## 2023-07-09 ENCOUNTER — Ambulatory Visit: Payer: Medicare HMO | Attending: Physician Assistant | Admitting: Physician Assistant

## 2023-07-09 ENCOUNTER — Encounter: Payer: Self-pay | Admitting: Family Medicine

## 2023-07-09 VITALS — BP 150/70 | HR 89 | Ht 66.0 in | Wt 194.2 lb

## 2023-07-09 DIAGNOSIS — I5032 Chronic diastolic (congestive) heart failure: Secondary | ICD-10-CM | POA: Diagnosis not present

## 2023-07-09 DIAGNOSIS — I43 Cardiomyopathy in diseases classified elsewhere: Secondary | ICD-10-CM | POA: Diagnosis not present

## 2023-07-09 DIAGNOSIS — R030 Elevated blood-pressure reading, without diagnosis of hypertension: Secondary | ICD-10-CM

## 2023-07-09 DIAGNOSIS — E854 Organ-limited amyloidosis: Secondary | ICD-10-CM | POA: Diagnosis not present

## 2023-07-09 DIAGNOSIS — R911 Solitary pulmonary nodule: Secondary | ICD-10-CM

## 2023-07-09 DIAGNOSIS — Z952 Presence of prosthetic heart valve: Secondary | ICD-10-CM | POA: Diagnosis not present

## 2023-07-09 DIAGNOSIS — I2510 Atherosclerotic heart disease of native coronary artery without angina pectoris: Secondary | ICD-10-CM | POA: Diagnosis not present

## 2023-07-09 DIAGNOSIS — Z950 Presence of cardiac pacemaker: Secondary | ICD-10-CM

## 2023-07-09 DIAGNOSIS — R918 Other nonspecific abnormal finding of lung field: Secondary | ICD-10-CM | POA: Diagnosis not present

## 2023-07-09 DIAGNOSIS — I4892 Unspecified atrial flutter: Secondary | ICD-10-CM | POA: Diagnosis not present

## 2023-07-09 NOTE — Patient Instructions (Signed)
 Medication Instructions:  Your physician recommends that you continue on your current medications as directed. Please refer to the Current Medication list given to you today.  *If you need a refill on your cardiac medications before your next appointment, please call your pharmacy*   Lab Work: None ordered  If you have labs (blood work) drawn today and your tests are completely normal, you will receive your results only by: MyChart Message (if you have MyChart) OR A paper copy in the mail If you have any lab test that is abnormal or we need to change your treatment, we will call you to review the results.   Testing/Procedures: Your physician recommends you have a CT Chest w/o contrast.     Follow-Up: At [facility], you and your health needs are our priority.  As part of our continuing mission to provide you with exceptional heart care, we have created designated Provider Care Teams.  These Care Teams include your primary Cardiologist (physician) and Advanced Practice Providers (APPs -  Physician Assistants and Nurse Practitioners) who all work together to provide you with the care you need, when you need it.  We recommend signing up for the patient portal called "MyChart".  Sign up information is provided on this After Visit Summary.  MyChart is used to connect with patients for Virtual Visits (Telemedicine).  Patients are able to view lab/test results, encounter notes, upcoming appointments, etc.  Non-urgent messages can be sent to your provider as well.   To learn more about what you can do with MyChart, go to [email or web address].    Your next appointment:   Per recall  Provider:   You will see one of the following Advanced Practice Providers on your designated Care Team:   Francis Dowse, Charlott Holler "Mardelle Matte" [place], [place] Sherie Don, NP Canary Brim, NP      Other Instructions Your physician has requested that you regularly monitor and record your blood  pressure readings at home. Please use the same machine at the same time of day to check your readings and record them to bring to your follow-up visit.   Please monitor blood pressures and keep a log of your readings for 2 weeks then send a mychart message with those readings.    Make sure to check 2 hours after your medications.    AVOID these things for 30 minutes before checking your blood pressure: No Drinking caffeine. No Drinking alcohol. No Eating. No Smoking. No Exercising.   Five minutes before checking your blood pressure: Pee. Sit in a dining chair. Avoid sitting in a soft couch or armchair. Be quiet. Do not talk     1st Floor: - Lobby - Registration  - Pharmacy  - Lab - Cafe  2nd Floor: - PV Lab - Diagnostic Testing (echo, CT, nuclear med)  3rd Floor: - Vacant  4th Floor: - TCTS (cardiothoracic surgery) - AFib Clinic - Structural Heart Clinic - Vascular Surgery  - Vascular Ultrasound  5th Floor: - HeartCare Cardiology (general and EP) - Clinical Pharmacy for coumadin, hypertension, lipid, weight-loss medications, and med management appointments    Valet parking services will be available as well.

## 2023-07-09 NOTE — Progress Notes (Signed)
 [facility]   MULTIDISCIPLINARY HEART VALVE CLINIC                                     Cardiology Office Note:    Date:  07/09/2023   ID:  Merdith L Scahill III, DOB 10/14/1945, MRN [id number]  PCP:  [name], MD  [facility] Cardiologist:  Will Jorja Loa, MD / Dr. Clifton James (TAVR) [facility] Electrophysiologist:  Will Jorja Loa, MD   Referring MD: [name], MD   1 year s/p TAVR  History of Present Illness:    Angela L Edgecombe III is a 78 y.o. 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  He is followed in our office by Dr. Raul Del. He has undergone atrial flutter ablation x 3. He has CAD and has undergone 3V CABG in 2011. He had dizziness and fatigue leading to an echo on 11/10/21 with LVEF=55-60% and severe aortic stenosis. Cardiac cath 04/13/22 with severe three vessel CAD with 3/3 patent bypass grafts. He underwent successful TAVR on 06/02/22 with placement of a 29 mm Edwards Sapien 3 THV from the transfemoral approach. Echo on post op day one with normally functioning AVR. He had symptomatic AV block during the first 24 hours following TAVR and had a permanent pacemaker placed. He felt poorly following discharge and was in atrial fibrillation with RVR. He was cardioverted to sinus on 07/02/22. Echo 07/07/22 with LVEF around 45%, mild to moderate mitral stenosis, mild MR and normally functioning AVR with a mean gradient of 5 and no PVL. Underwent repeat cardioversion on 11/10/22. He is followed closely in the afib clinic. Echo 07/07/23 showed EF 50-55%, mild RVE normally functioning TAVR with a mean gradient of 6 mmHg and no PVL as well as moderate MAC with mild to mod MS/MR.     Had a left retinal detachment in summer of 2024. Followed by Dr. Luciana Axe. He said there was evidence of an ocular CVA and carotid dopplers were ordered by PCP. This was completed 06/26/23 and showed possible carotid and subclavian stenosis but discordant data and cerebral angiogram vs CTA recommended. Pt has a MRA without contrast scheduled in early April.   Today the patient presents to clinic for follow up. Here with his wife. He is mostly limited by back pain. He can walk about 50 feet before he has to stop due to back pain. He has occasional mild LE edema and takes a lasix every two weeks or so. No CP or SOB. No orthopnea or PND. No dizziness or syncope. No blood in stool or urine. No palpitations. No symptoms with use of upper extremities.      Past Medical History:  Diagnosis Date   Anemia    Arthritis    "back, left ankle" (11/20/2015)   Cataract    CHF (congestive heart failure) (HCC)    Chronic lower back pain    Coronary artery disease    a. 05/2009 CABG x 3: LIMA->LAD, VG->OM, VG->PDA; b. Nuc 01/2014: inf-lat scar but no ischemia, EF 57%.   Diabetic retinopathy (HCC)    mild- Dr. [name]   Glaucoma    Hyperlipidemia  a. Intolerant of lipitor and vytorin.   Lyme disease    Migraine    "once or twice" (11/20/2015)   Mitral stenosis    Mild to moderate with mean mitral valve gradient 4 mmHg by echo 06/2023   Myocardial infarction [facility])    'saw evidence of it on an EKG done in 2005"   Paroxysmal atrial flutter (HCC)    a. 01/2014 s/p DCCV;  b. CHA2DS2VASc = 3-->Eliquis.   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 Clifton James and Dr. Laneta Simmers   Sacral pain    "right"   Sciatica of right side    Testicular cancer (HCC) 1982   "heavy doses of radiation; it was stage 2"   Type II diabetes mellitus (HCC)    Valvular heart disease    a. 01/2014 Echo: Ef 60-65%, no rwma, mild AI/MS, mod MR, mildly dil LA.      Current Medications: Current Meds  Medication Sig   apixaban (ELIQUIS) 5 MG TABS tablet Take 1 tablet (5 mg total) by mouth 2 (two) times daily.   BD VEO INSULIN SYRINGE U/F 31G X 15/64" 0.5 ML MISC USE TO INJECT 4 TIMES A DAY   Continuous Blood Gluc Sensor (DEXCOM G7 SENSOR) MISC by Does not apply route.   cyanocobalamin (VITAMIN B12) 1000 MCG tablet Take 1,000 mcg by mouth daily.   dorzolamide-timolol (COSOPT) 22.3-6.8 MG/ML ophthalmic solution Place 1 drop into the left eye 2 (two) times daily.   furosemide (LASIX) 40 MG tablet Take 40 mg by mouth as needed for fluid or edema.   insulin NPH Human (NOVOLIN N) 100 UNIT/ML injection Inject 0.2-0.25 mLs (20-25 Units total) into the skin at bedtime.   lisinopril (ZESTRIL) 5 MG tablet TAKE 1 TABLET BY MOUTH EVERY DAY   NOVOLOG 100 UNIT/ML injection INJECT 15-20 UNITS INTO THE SKIN 3 (THREE) TIMES DAILY WITH MEALS. SLIDING SCALE   potassium chloride (KLOR-CON) 10 MEQ tablet TAKE 1 TABLET BY MOUTH EVERY DAY (Patient taking differently: Take 10 mEq by mouth daily. As needed with Furosemide)      ROS:   Please see the history of present illness.    All other systems reviewed and are negative.  EKGs       Risk Assessment/Calculations:    CHA2DS2-VASc Score = 4   This indicates a 4.8% annual risk of stroke. The patient's score is based upon: CHF History: 1 HTN History: 0 Diabetes History: 1 Stroke History: 0 Vascular Disease History: 1 Age Score: 1 Gender Score: 0         Physical Exam:    VS:  BP (!) 150/70   Pulse 89   Ht 5\' 6"  (1.676 m)   Wt 194 lb 3.2 oz (88.1 kg)   SpO2 95%   BMI 31.34 kg/m     Wt Readings from Last 3 Encounters:  07/09/23 194 lb 3.2 oz (88.1 kg)  07/01/23 196 lb 3.2 oz (89 kg)  05/18/23 200 lb (90.7 kg)     GEN: Well nourished, well developed in no acute distress NECK: No JVD CARDIAC: RRR, no murmurs, rubs, gallops RESPIRATORY:  Clear to auscultation without rales, wheezing or  rhonchi  ABDOMEN: Soft, non-tender, non-distended EXTREMITIES:  No edema; No deformity.   ASSESSMENT:    1. S/P TAVR (transcatheter aortic valve replacement)   2. Pacemaker   3. Paroxysmal atrial flutter (HCC)   4. Chronic heart failure with preserved  ejection fraction (HCC)   5. Coronary artery disease involving native coronary artery of native heart without angina pectoris   6. Pulmonary nodule   7. Cardiac amyloidosis (HCC)   8. Abnormal findings on diagnostic imaging of lung   9. Elevated BP without diagnosis of hypertension     PLAN:    In order of problems listed above:  Severe AS s/p TAVR:  -- Echo 07/07/23 showed EF 50-55%, mild RVE, normally functioning TAVR with a mean gradient of 6 mmHg and no PVL as well as moderate MAC with mild to mod MS/MR.   -- NYHA class I symptoms; mobility mostly limited by back pain.  -- Continue Eliquis 5mg  BID.  -- SBE prophylaxis when indicated.  -- Continue regular follow up with Dr. Elberta Fortis.    S/p PPM:  -- Followed by Dr. Elberta Fortis.    Paroxysmal atrial fibrillation/flutter: -- Has undergone multiple ablations and cardioversions. -- Followed in afib clinic.    Chronic diastolic CHF:  -- Appears euvolemic. -- Only takes Lasix/KLOR PRN twice a month.    CAD s/p 3V CABG without angina:  -- No chest pain.  -- He is not on ASA since he is on Eliquis.  -- He does not tolerate statins.    Small pulmonary nodule:  -- Noted on pre TAVR CTs. -- Follow up CT chest scheduled for 07/16/23 given history of testicular cancer.    Elevated RAISE score:  -- He was noted to be at increased risk for amyloid during TAVR work up and amyloid work up was initiated.  -- Kappa free light chains and kappa/lambda light chain ratio were abnormal.  -- PYP scan was equivocal. -- Plan was for a repeat PYP scan in 6 months but the patient later declined further work up.  Ocular CVA: -- Evidence of occular CVA noted by ophthalmologist.  -- Carotid dopplers  were ordered. This was completed 06/26/23 and showed possible carotid and subclavian stenosis but discordant data and cerebral angiogram vs CTA recommended.  -- Pt has a MRA without contrast scheduled in early April.   Elevated BP without a history of HTN: -- 150/70 on both arms (checked both given possible subclavian stenosis noted on recent carotid duplex).  -- On lisinopril 5 mg daily for renal protection given history of DMT2. -- Asked to keep a log of BPs at home and Mychart me in 2 weeks.  -- If persistently elevated, I will increase ACEi.    Medication Adjustments/Labs and Tests Ordered: Current medicines are reviewed at length with the patient today.  Concerns regarding medicines are outlined above.  Orders Placed This Encounter  Procedures   CT Chest Wo Contrast   No orders of the defined types were placed in this encounter.   Patient Instructions  Medication Instructions:  Your physician recommends that you continue on your current medications as directed. Please refer to the Current Medication list given to you today.  *If you need a refill on your cardiac medications before your next appointment, please call your pharmacy*   Lab Work: None ordered  If you have labs (blood work) drawn today and your tests are completely normal, you will receive your results only by: MyChart Message (if you have MyChart) OR A paper copy in the mail If you have any lab test that is abnormal or we need to change your treatment, we will call you to review the results.   Testing/Procedures: Your physician recommends you have a CT Chest w/o contrast.  Follow-Up: At [facility], you and your health needs are our priority.  As part of our continuing mission to provide you with exceptional heart care, we have created designated Provider Care Teams.  These Care Teams include your primary Cardiologist (physician) and Advanced Practice Providers (APPs -  Physician Assistants and  Nurse Practitioners) who all work together to provide you with the care you need, when you need it.  We recommend signing up for the patient portal called "MyChart".  Sign up information is provided on this After Visit Summary.  MyChart is used to connect with patients for Virtual Visits (Telemedicine).  Patients are able to view lab/test results, encounter notes, upcoming appointments, etc.  Non-urgent messages can be sent to your provider as well.   To learn more about what you can do with MyChart, go to [email or web address].    Your next appointment:   Per recall  Provider:   You will see one of the following Advanced Practice Providers on your designated Care Team:   Francis Dowse, Charlott Holler "Mardelle Matte" Hagerman, [place] Sherie Don, NP Canary Brim, NP      Other Instructions Your physician has requested that you regularly monitor and record your blood pressure readings at home. Please use the same machine at the same time of day to check your readings and record them to bring to your follow-up visit.   Please monitor blood pressures and keep a log of your readings for 2 weeks then send a mychart message with those readings.    Make sure to check 2 hours after your medications.    AVOID these things for 30 minutes before checking your blood pressure: No Drinking caffeine. No Drinking alcohol. No Eating. No Smoking. No Exercising.   Five minutes before checking your blood pressure: Pee. Sit in a dining chair. Avoid sitting in a soft couch or armchair. Be quiet. Do not talk     1st Floor: - Lobby - Registration  - Pharmacy  - Lab - Cafe  2nd Floor: - PV Lab - Diagnostic Testing (echo, CT, nuclear med)  3rd Floor: - Vacant  4th Floor: - TCTS (cardiothoracic surgery) - AFib Clinic - Structural Heart Clinic - Vascular Surgery  - Vascular Ultrasound  5th Floor: - HeartCare Cardiology (general and EP) - Clinical Pharmacy for coumadin, hypertension, lipid,  weight-loss medications, and med management appointments    Valet parking services will be available as well.         Signed, Cline Crock, PA-C  07/09/2023 2:05 PM    [facility]

## 2023-07-10 DIAGNOSIS — E1165 Type 2 diabetes mellitus with hyperglycemia: Secondary | ICD-10-CM | POA: Diagnosis not present

## 2023-07-12 DIAGNOSIS — E113511 Type 2 diabetes mellitus with proliferative diabetic retinopathy with macular edema, right eye: Secondary | ICD-10-CM | POA: Diagnosis not present

## 2023-07-12 DIAGNOSIS — H3412 Central retinal artery occlusion, left eye: Secondary | ICD-10-CM | POA: Diagnosis not present

## 2023-07-13 NOTE — Progress Notes (Signed)
 Remote pacemaker transmission.

## 2023-07-13 NOTE — Addendum Note (Signed)
 Addended by: Elease Etienne A on: 07/13/2023 09:41 AM   Modules accepted: Orders

## 2023-07-14 ENCOUNTER — Telehealth: Payer: Self-pay

## 2023-07-14 NOTE — Telephone Encounter (Signed)
-----   Message from [name] sent at 07/14/2023 12:44 PM EDT ----- Results sent by MyChart, but appears patient has not yet reviewed those results.  Please call and make sure they have either seen note or discuss result note.  Thanks.

## 2023-07-14 NOTE — Telephone Encounter (Signed)
 Lab results have been discussed.   Verbalized understanding? Yes  Are there any questions? No

## 2023-07-16 ENCOUNTER — Ambulatory Visit (HOSPITAL_COMMUNITY)
Admission: RE | Admit: 2023-07-16 | Discharge: 2023-07-16 | Disposition: A | Discharge status: Home / Self Care | Source: Ambulatory Visit | Attending: Physician Assistant | Admitting: Physician Assistant

## 2023-07-16 DIAGNOSIS — R911 Solitary pulmonary nodule: Secondary | ICD-10-CM | POA: Diagnosis not present

## 2023-07-16 DIAGNOSIS — R918 Other nonspecific abnormal finding of lung field: Secondary | ICD-10-CM | POA: Diagnosis not present

## 2023-07-19 ENCOUNTER — Other Ambulatory Visit: Payer: Self-pay | Admitting: *Deleted

## 2023-07-19 MED ORDER — APIXABAN 5 MG PO TABS: 5.0000 mg | ORAL_TABLET | Freq: Two times a day (BID) | ORAL | 5 refills | Status: DC

## 2023-07-19 NOTE — Telephone Encounter (Signed)
 Prescription refill request for Eliquis received. Indication: afib  Last office visit:[place] 07/09/2023 Scr:1.09 01/19/2023 Age: 78 yo  Weight: 88.1 kg   Refill sent.

## 2023-07-20 ENCOUNTER — Inpatient Hospital Stay: Payer: Medicare HMO | Attending: Hematology and Oncology

## 2023-07-20 ENCOUNTER — Other Ambulatory Visit: Payer: Self-pay | Admitting: Hematology and Oncology

## 2023-07-20 DIAGNOSIS — R768 Other specified abnormal immunological findings in serum: Secondary | ICD-10-CM | POA: Insufficient documentation

## 2023-07-20 DIAGNOSIS — D649 Anemia, unspecified: Secondary | ICD-10-CM | POA: Insufficient documentation

## 2023-07-20 DIAGNOSIS — E538 Deficiency of other specified B group vitamins: Secondary | ICD-10-CM | POA: Diagnosis not present

## 2023-07-20 DIAGNOSIS — Z87891 Personal history of nicotine dependence: Secondary | ICD-10-CM | POA: Insufficient documentation

## 2023-07-20 LAB — IRON AND IRON BINDING CAPACITY (CC-WL,HP ONLY)
Iron: 54 ug/dL (ref 45–182)
Saturation Ratios: 15 % — ABNORMAL LOW (ref 17.9–39.5)
TIBC: 354 ug/dL (ref 250–450)
UIBC: 300 ug/dL (ref 117–376)

## 2023-07-20 LAB — CBC WITH DIFFERENTIAL (CANCER CENTER ONLY)
Abs Immature Granulocytes: 0.02 10*3/uL (ref 0.00–0.07)
Basophils Absolute: 0 10*3/uL (ref 0.0–0.1)
Basophils Relative: 1 %
Eosinophils Absolute: 0.3 10*3/uL (ref 0.0–0.5)
Eosinophils Relative: 4 %
HCT: 35.7 % — ABNORMAL LOW (ref 39.0–52.0)
Hemoglobin: 11.7 g/dL — ABNORMAL LOW (ref 13.0–17.0)
Immature Granulocytes: 0 %
Lymphocytes Relative: 18 %
Lymphs Abs: 1.1 10*3/uL (ref 0.7–4.0)
MCH: 28.2 pg (ref 26.0–34.0)
MCHC: 32.8 g/dL (ref 30.0–36.0)
MCV: 86 fL (ref 80.0–100.0)
Monocytes Absolute: 0.4 10*3/uL (ref 0.1–1.0)
Monocytes Relative: 7 %
Neutro Abs: 4.3 10*3/uL (ref 1.7–7.7)
Neutrophils Relative %: 70 %
Platelet Count: 214 10*3/uL (ref 150–400)
RBC: 4.15 MIL/uL — ABNORMAL LOW (ref 4.22–5.81)
RDW: 14.2 % (ref 11.5–15.5)
WBC Count: 6.1 10*3/uL (ref 4.0–10.5)
nRBC: 0 % (ref 0.0–0.2)

## 2023-07-20 LAB — CMP (CANCER CENTER ONLY)
ALT: 13 U/L (ref 0–44)
AST: 17 U/L (ref 15–41)
Albumin: 3.8 g/dL (ref 3.5–5.0)
Alkaline Phosphatase: 93 U/L (ref 38–126)
Anion gap: 5 (ref 5–15)
BUN: 25 mg/dL — ABNORMAL HIGH (ref 8–23)
CO2: 28 mmol/L (ref 22–32)
Calcium: 9.1 mg/dL (ref 8.9–10.3)
Chloride: 104 mmol/L (ref 98–111)
Creatinine: 1.41 mg/dL — ABNORMAL HIGH (ref 0.61–1.24)
GFR, Estimated: 52 mL/min — ABNORMAL LOW (ref >60)
Glucose, Bld: 331 mg/dL — ABNORMAL HIGH (ref 70–99)
Potassium: 4.4 mmol/L (ref 3.5–5.1)
Sodium: 137 mmol/L (ref 135–145)
Total Bilirubin: 0.4 mg/dL (ref 0.0–1.2)
Total Protein: 7.1 g/dL (ref 6.5–8.1)

## 2023-07-20 LAB — FERRITIN: Ferritin: 41 ng/mL (ref 24–336)

## 2023-07-20 LAB — VITAMIN B12: Vitamin B-12: 288 pg/mL (ref 180–914)

## 2023-07-21 ENCOUNTER — Inpatient Hospital Stay: Payer: Medicare HMO | Admitting: Hematology and Oncology

## 2023-07-21 ENCOUNTER — Ambulatory Visit: Payer: Medicare HMO | Admitting: Hematology and Oncology

## 2023-07-21 ENCOUNTER — Other Ambulatory Visit: Payer: Medicare HMO

## 2023-07-21 VITALS — BP 134/63 | HR 80 | Temp 97.3°F | Resp 14 | Wt 194.9 lb

## 2023-07-21 DIAGNOSIS — R768 Other specified abnormal immunological findings in serum: Secondary | ICD-10-CM | POA: Diagnosis not present

## 2023-07-21 DIAGNOSIS — Z87891 Personal history of nicotine dependence: Secondary | ICD-10-CM | POA: Diagnosis not present

## 2023-07-21 DIAGNOSIS — D649 Anemia, unspecified: Secondary | ICD-10-CM | POA: Diagnosis not present

## 2023-07-21 DIAGNOSIS — E538 Deficiency of other specified B group vitamins: Secondary | ICD-10-CM | POA: Diagnosis not present

## 2023-07-21 NOTE — Progress Notes (Signed)
 [facility] [phone]   [phone]  PROGRESS NOTE  Patient Care Team: [name], MD as PCP - General (Family Medicine) Regan Lemming, MD as PCP - Electrophysiology (Cardiology) Regan Lemming, MD as PCP - Cardiology (Cardiology) Regan Lemming, MD as Consulting Physician (Cardiology)  Hematological/Oncological History # Normocytic Anemia # Elevated Serum Free Light Chain Ratio  # Vitamin B12 Deficiency  06/02/2022: SPEP showed no M protein, Kappa 38.2, Lambda 19.8, K/L ratio 1.93 07/13/2022: establish care with Dr. Leonides Schanz   Interval History:  Matthew [name] 78 y.o. male with medical history significant for vitamin B12 deficiency who presents for a follow up visit. The patient's last visit was on 01/21/2023.. In the interim since the last visit he has been taking his vitamin B12 pills as prescribed.  On exam today Mr. Reicks is accompanied by his wife.  He reports he is had no major changes in his health in the interim since our last visit with no new medications, hospitalizations, or ER visits.  Reports has not had the flu but did have norovirus about 1 month ago when he was at the beach.  He had a classic nausea, vomiting, and diarrhea which is subsequently resolved.  He reports his energy level is quite low at a 4 or 5 out of 10.  He reports he is not having any lightheadedness, dizziness, or shortness of breath.  His blood sugars have been running out of control and he is currently up to 300.  He reports he will be seeing his primary care provider next week I will be making adjustments to his diabetes medications.  He reports that he also eats a relatively well-balanced diet including broccoli and beans as well as spinach.  He reports he is having no overt signs of bleeding, bruising, or dark stools.  He notes he has not been doing well with hydration has been strict about 2 diet Cokes per day.  Otherwise he denies any  fevers, chills, sweats, nausea, vomiting or diarrhea.  A full 10 point ROS is otherwise negative.  MEDICAL HISTORY:  Past Medical History:  Diagnosis Date   Anemia    Arthritis    "back, left ankle" (11/20/2015)   Cataract    CHF (congestive heart failure) (HCC)    Chronic lower back pain    Coronary artery disease    a. 05/2009 CABG x 3: LIMA->LAD, VG->OM, VG->PDA; b. Nuc 01/2014: inf-lat scar but no ischemia, EF 57%.   Diabetic retinopathy (HCC)    mild- Dr. [name]   Glaucoma    Hyperlipidemia    a. Intolerant of lipitor and vytorin.   Lyme disease    Migraine    "once or twice" (11/20/2015)   Mitral stenosis    Mild to moderate with mean mitral valve gradient 4 mmHg by echo 06/2023   Myocardial infarction [facility])    'saw evidence of it on an EKG done in 2005"   Paroxysmal atrial flutter (HCC)    a. 01/2014 s/p DCCV;  b. CHA2DS2VASc = 3-->Eliquis.   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 Clifton James and Dr. Laneta Simmers   Sacral pain    "right"   Sciatica of right side    Testicular cancer (HCC) 1982   "heavy doses of radiation; it was stage 2"   Type II diabetes mellitus (HCC)    Valvular heart disease  a. 01/2014 Echo: Ef 60-65%, no rwma, mild AI/MS, mod MR, mildly dil LA.    SURGICAL HISTORY: Past Surgical History:  Procedure Laterality Date   ABDOMINAL EXPLORATION SURGERY  ~ 2005   "for hernia, but didn't have one"   ATRIAL FIBRILLATION ABLATION N/A 09/17/2016   Procedure: Atrial Fibrillation Ablation;  Surgeon: Regan Lemming, MD;  Location: [facility] INVASIVE CV LAB;  Service: Cardiovascular;  Laterality: N/A;   CARDIAC CATHETERIZATION  2011   CARDIOVERSION N/A 02/19/2014   Procedure: CARDIOVERSION;  Surgeon: Lesleigh Noe, MD;  Location: [facility] ENDOSCOPY;  Service: Cardiovascular;  Laterality: N/A;   CARDIOVERSION N/A 10/28/2015   Procedure: CARDIOVERSION;  Surgeon: Vesta Mixer, MD;  Location:  [facility] ENDOSCOPY;  Service: Cardiovascular;  Laterality: N/A;   CARDIOVERSION  11/20/2015   "200 joules"   CARDIOVERSION N/A 07/02/2022   Procedure: CARDIOVERSION;  Surgeon: Jodelle Red, MD;  Location: [facility] ENDOSCOPY;  Service: Cardiovascular;  Laterality: N/A;   CARDIOVERSION N/A 11/10/2022   Procedure: CARDIOVERSION;  Surgeon: Thurmon Fair, MD;  Location: [facility] INVASIVE CV LAB;  Service: Cardiovascular;  Laterality: N/A;   CORONARY ARTERY BYPASS GRAFT  05/2009   LIMA-LAD, SVG-OM, SVG-PDA 06/20/09   ELBOW FRACTURE SURGERY Left    broken ulna on left elbow-surgical repair   ELECTROPHYSIOLOGIC STUDY N/A 10/31/2014   Procedure: A-Flutter;  Surgeon: Marinus Maw, MD;  Location: [facility] INVASIVE CV LAB;  Service: Cardiovascular;  Laterality: N/A;   ELECTROPHYSIOLOGIC STUDY N/A 11/20/2015   Procedure: A-Flutter Ablation;  Surgeon: Marinus Maw, MD;  Location: [facility] INVASIVE CV LAB;  Service: Cardiovascular;  Laterality: N/A;   EYE SURGERY     FRACTURE SURGERY     INTRAOPERATIVE TRANSTHORACIC ECHOCARDIOGRAM N/A 06/02/2022   Procedure: INTRAOPERATIVE TRANSTHORACIC ECHOCARDIOGRAM;  Surgeon: Kathleene Hazel, MD;  Location: [facility] OR;  Service: Open Heart Surgery;  Laterality: N/A;   PACEMAKER IMPLANT N/A 06/03/2022   Procedure: PACEMAKER IMPLANT;  Surgeon: Regan Lemming, MD;  Location: [facility] INVASIVE CV LAB;  Service: Cardiovascular;  Laterality: N/A;   RIGHT/LEFT HEART CATH AND CORONARY/GRAFT ANGIOGRAPHY N/A 04/13/2022   Procedure: RIGHT/LEFT HEART CATH AND CORONARY/GRAFT ANGIOGRAPHY;  Surgeon: Kathleene Hazel, MD;  Location: [facility] INVASIVE CV LAB;  Service: Cardiovascular;  Laterality: N/A;   TEE WITHOUT CARDIOVERSION N/A 03/13/2022   Procedure: TRANSESOPHAGEAL ECHOCARDIOGRAM (TEE);  Surgeon: Thurmon Fair, MD;  Location: [facility] ENDOSCOPY;  Service: Cardiovascular;  Laterality: N/A;   TESTICLE REMOVAL Right 1982   TONSILLECTOMY  ~ 1967   TRANSCATHETER AORTIC VALVE REPLACEMENT, TRANSFEMORAL N/A  06/02/2022   Procedure: Transcatheter Aortic Valve Replacement, Transfemoral;  Surgeon: Kathleene Hazel, MD;  Location: [facility] OR;  Service: Open Heart Surgery;  Laterality: N/A;    SOCIAL HISTORY: Social History   Socioeconomic History   Marital status: Married    Spouse name: Not on file   Number of children: 0   Years of education: Not on file   Highest education level: Bachelor's degree (e.g., BA, AB, BS)  Occupational History   Occupation: Insurance account manager  Tobacco Use   Smoking status: Former    Current packs/day: 0.00    Average packs/day: 0.5 packs/day for 15.0 years (7.5 ttl pk-yrs)    Types: Cigarettes    Start date: 17    Quit date: 1990    Years since quitting: 35.2   Smokeless tobacco: Never   Tobacco comments:    "quit smoking cigarettes in the 1980's; don't know how much or for how long"  Vaping Use  Vaping status: Never Used  Substance and Sexual Activity   Alcohol use: Yes    Alcohol/week: 3.0 standard drinks of alcohol    Types: 3 Cans of beer per week   Drug use: No    Comment: "quit smoking pot in the 1990s"   Sexual activity: Not Currently    Birth control/protection: Coitus interruptus  Other Topics Concern   Not on file  Social History Narrative   Not on file   Social Drivers of Health   Financial Resource Strain: Patient Declined (06/30/2023)   Overall Financial Resource Strain (CARDIA)    Difficulty of Paying Living Expenses: Patient declined  Food Insecurity: Patient Declined (06/30/2023)   Hunger Vital Sign    Worried About Running Out of Food in the Last Year: Patient declined    Ran Out of Food in the Last Year: Patient declined  Transportation Needs: No Transportation Needs (06/30/2023)   PRAPARE - Administrator, Civil Service (Medical): No    Lack of Transportation (Non-Medical): No  Physical Activity: Unknown (06/30/2023)   Exercise Vital Sign    Days of Exercise per Week: 0 days    Minutes of Exercise per Session: Not  on file  Stress: No Stress Concern Present (06/30/2023)   [employer] of Occupational Health - Occupational Stress Questionnaire    Feeling of Stress : Not at all  Social Connections: Unknown (06/30/2023)   Social Connection and Isolation Panel [NHANES]    Frequency of Communication with Friends and Family: More than three times a week    Frequency of Social Gatherings with Friends and Family: Three times a week    Attends Religious Services: Patient declined    Active Member of Clubs or Organizations: No    Attends Banker Meetings: Not on file    Marital Status: Married  Catering manager Violence: Not At Risk (03/28/2022)   Humiliation, Afraid, Rape, and Kick questionnaire    Fear of Current or Ex-Partner: No    Emotionally Abused: No    Physically Abused: No    Sexually Abused: No    FAMILY HISTORY: Family History  Problem Relation Age of Onset   Diabetes Mother    Heart disease Mother        Heart murmur   Heart disease Father    Heart attack Father    Diabetes Father    Hypertension Father    Diabetes Sister     ALLERGIES:  is allergic to bee venom, codeine, pioglitazone, metformin, statins, and raspberry.  MEDICATIONS:  Current Outpatient Medications  Medication Sig Dispense Refill   apixaban (ELIQUIS) 5 MG TABS tablet Take 1 tablet (5 mg total) by mouth 2 (two) times daily. 60 tablet 5   BD VEO INSULIN SYRINGE U/F 31G X 15/64" 0.5 ML MISC USE TO INJECT 4 TIMES A DAY 400 each 3   Continuous Blood Gluc Sensor (DEXCOM G7 SENSOR) MISC by Does not apply route.     cyanocobalamin (VITAMIN B12) 1000 MCG tablet Take 1,000 mcg by mouth daily.     dorzolamide-timolol (COSOPT) 22.3-6.8 MG/ML ophthalmic solution Place 1 drop into the left eye 2 (two) times daily.     furosemide (LASIX) 40 MG tablet Take 40 mg by mouth as needed for fluid or edema.     insulin NPH Human (NOVOLIN N) 100 UNIT/ML injection Inject 0.2-0.25 mLs (20-25 Units total) into the skin at  bedtime. 22 mL 1   lisinopril (ZESTRIL) 5 MG tablet TAKE 1 TABLET  BY MOUTH EVERY DAY 90 tablet 3   NOVOLOG 100 UNIT/ML injection INJECT 15-20 UNITS INTO THE SKIN 3 (THREE) TIMES DAILY WITH MEALS. SLIDING SCALE 30 mL 1   potassium chloride (KLOR-CON) 10 MEQ tablet TAKE 1 TABLET BY MOUTH EVERY DAY (Patient taking differently: Take 10 mEq by mouth daily. As needed with Furosemide) 90 tablet 1   No current facility-administered medications for this visit.    REVIEW OF SYSTEMS:   Constitutional: ( - ) fevers, ( - )  chills , ( - ) night sweats Eyes: ( - ) blurriness of vision, ( - ) double vision, ( - ) watery eyes Ears, nose, mouth, throat, and face: ( - ) mucositis, ( - ) sore throat Respiratory: ( - ) cough, ( - ) dyspnea, ( - ) wheezes Cardiovascular: ( - ) palpitation, ( - ) chest discomfort, ( - ) lower extremity swelling Gastrointestinal:  ( - ) nausea, ( - ) heartburn, ( - ) change in bowel habits Skin: ( - ) abnormal skin rashes Lymphatics: ( - ) new lymphadenopathy, ( - ) easy bruising Neurological: ( - ) numbness, ( - ) tingling, ( - ) new weaknesses Behavioral/Psych: ( - ) mood change, ( - ) new changes  All other systems were reviewed with the patient and are negative.  PHYSICAL EXAMINATION:  Vitals:   07/21/23 1046  BP: 134/63  Pulse: 80  Resp: 14  Temp: (!) 97.3 F (36.3 C)  SpO2: 97%    Filed Weights   07/21/23 1046  Weight: 194 lb 14.4 oz (88.4 kg)     GENERAL: Well-appearing elderly Caucasian male, alert, no distress and comfortable SKIN: skin color, texture, turgor are normal, no rashes or significant lesions EYES: conjunctiva are pink and non-injected, sclera clear LUNGS: clear to auscultation and percussion with normal breathing effort HEART: regular rate & rhythm and no murmurs and no lower extremity edema Musculoskeletal: no cyanosis of digits and no clubbing  PSYCH: alert & oriented x 3, fluent speech NEURO: no focal motor/sensory  deficits  LABORATORY DATA:  I have reviewed the data as listed    Latest Ref Rng & Units 07/20/2023   10:33 AM 07/01/2023   12:26 PM 01/19/2023   10:41 AM  CBC  WBC 4.0 - 10.5 K/uL 6.1  8.0  6.3   Hemoglobin 13.0 - 17.0 g/dL 16.1  09.6  04.5   Hematocrit 39.0 - 52.0 % 35.7  37.1  36.0   Platelets 150 - 400 K/uL 214  255.0  223        Latest Ref Rng & Units 07/20/2023   10:33 AM 01/19/2023   10:41 AM 11/06/2022    1:15 PM  CMP  Glucose 70 - 99 mg/dL 409  811  914   BUN 8 - 23 mg/dL 25  18  20    Creatinine 0.61 - 1.24 mg/dL 7.82  9.56  2.13   Sodium 135 - 145 mmol/L 137  139  138   Potassium 3.5 - 5.1 mmol/L 4.4  4.3  4.6   Chloride 98 - 111 mmol/L 104  106  107   CO2 22 - 32 mmol/L 28  30  24    Calcium 8.9 - 10.3 mg/dL 9.1  9.1  8.8   Total Protein 6.5 - 8.1 g/dL 7.1  6.7    Total Bilirubin 0.0 - 1.2 mg/dL 0.4  0.6    Alkaline Phos 38 - 126 U/L 93  88    AST 15 -  41 U/L 17  18    ALT 0 - 44 U/L 13  14      Lab Results  Component Value Date   MPROTEIN Not Observed 07/13/2022   MPROTEIN Not Observed 06/02/2022   Lab Results  Component Value Date   KPAFRELGTCHN 46.1 (H) 07/13/2022   KPAFRELGTCHN 38.2 (H) 06/02/2022   LAMBDASER 25.6 07/13/2022   LAMBDASER 19.8 06/02/2022   KAPLAMBRATIO 5.31 07/15/2022   KAPLAMBRATIO 1.80 (H) 07/13/2022   KAPLAMBRATIO 1.93 (H) 06/02/2022     RADIOGRAPHIC STUDIES: US Carotid Bilateral Result Date: 07/08/2023 CLINICAL DATA:  78 year old male with central retinal artery occlusion EXAM: BILATERAL CAROTID DUPLEX ULTRASOUND TECHNIQUE: Wallace Cullens scale imaging, color Doppler and duplex ultrasound were performed of bilateral carotid and vertebral arteries in the neck. COMPARISON:  None Available. FINDINGS: Criteria: Quantification of carotid stenosis is based on velocity parameters that correlate the residual internal carotid diameter with NASCET-based stenosis levels, using the diameter of the distal internal carotid lumen as the denominator for  stenosis measurement. The following velocity measurements were obtained: RIGHT ICA:  Systolic 107 cm/sec, Diastolic 17 cm/sec CCA:  113 cm/sec SYSTOLIC ICA/CCA RATIO:  1.0 ECA:  107 cm/sec LEFT ICA:  Systolic 148 cm/sec, Diastolic 23 cm/sec CCA:  157 cm/sec SYSTOLIC ICA/CCA RATIO:  0.9 ECA:  128 cm/sec Right Brachial SBP: 160 Left Brachial SBP: 131 RIGHT CAROTID ARTERY: No significant calcifications of the right common carotid artery. Intermediate waveform maintained. Heterogeneous and partially calcified plaque at the right carotid bifurcation. No significant lumen shadowing. Low resistance waveform of the right ICA. No significant tortuosity. RIGHT VERTEBRAL ARTERY: Antegrade flow with low resistance waveform. LEFT CAROTID ARTERY: No significant calcifications of the left common carotid artery. Intermediate waveform maintained. Heterogeneous and partially calcified plaque at the left carotid bifurcation. No significant lumen shadowing. Low resistance waveform of the left ICA. No significant tortuosity. LEFT VERTEBRAL ARTERY:  Antegrade flow with low resistance waveform. IMPRESSION: Right: Color duplex indicates minimal heterogeneous and calcified plaque, with no hemodynamically significant stenosis by duplex criteria in the extracranial cerebrovascular circulation. Left: Heterogeneous and partially calcified plaque at the left carotid bifurcation, with discordant results regarding degree of stenosis by established duplex criteria. Peak velocity suggests 50%-69% stenosis, with the ICA/ CCA ratio suggesting a lesser degree of stenosis. If establishing a more accurate degree of stenosis is required, cerebral angiogram should be considered, or as a second best test, CTA. Asymmetric upper extremity blood pressure, lower on the left. Office based upper extremity blood pressure assessment may be useful if there is concern for developing left subclavian artery stenosis. Signed, Yvone Neu. Miachel Roux, RPVI [facility] Radiology Electronically Signed   By: Gilmer Mor D.O.   On: 07/08/2023 16:44   ECHOCARDIOGRAM COMPLETE Result Date: 07/07/2023    ECHOCARDIOGRAM REPORT   Patient Name:   Zygmund L Farrior [name] Date of Exam: 07/07/2023 Medical Rec #:  [id number]            Height:       64.0 in Accession #:    [id number]           Weight:       196.2 lb Date of Birth:  09/24/1945           BSA:          1.940 m Patient Age:    77 years             BP:  130/62 mmHg Patient Gender: M                    HR:           83 bpm. Exam Location:  [address] Procedure: 2D Echo, Cardiac Doppler, Color Doppler and Intracardiac            Opacification Agent (Both Spectral and Color Flow Doppler were            utilized during procedure). Indications:    S/P TAVR (transcatheter aortic valve replacement) [[id number]]  History:        Patient has prior history of Echocardiogram examinations, most                 recent 07/03/2022. CHF, CAD and Previous Myocardial Infarction,                 Arrythmias:Atrial Flutter; Risk Factors:Dyslipidemia and                 Diabetes.                 Aortic Valve: 29 mm Sapien prosthetic, stented (TAVR) valve is                 present in the aortic position. Procedure Date: 06/02/22.  Sonographer:    Eulah Pont RDCS Referring Phys: [phone] JILL D MCDANIEL IMPRESSIONS  1. Left ventricular ejection fraction, by estimation, is 50 to 55%. The left ventricle has low normal function. The left ventricle has no regional wall motion abnormalities. Left ventricular diastolic parameters are consistent with Grade II diastolic dysfunction (pseudonormalization).  2. Right ventricular systolic function is mildly reduced. The right ventricular size is mildly enlarged. There is normal pulmonary artery systolic pressure. The estimated right ventricular systolic pressure is 17.0 mmHg.  3. Left atrial size was mildly dilated.  4. Right atrial size was mildly dilated.  5.  The mitral valve is degenerative. Mild-moderate mitral stenosis. The mean mitral valve gradient is 4.0 mmHg, mean gradient 1.88 cm^2 by VTI. Moderate to severe mitral annular calcification. Mild mitral regurgitation.  6. Bioprosthetic aortic valve s/p TAVR with 29 mm Edwards Sapien THV. Mean gradient 6 mmHg, dimensionless index 0.5. No significant perivalvular leakage. Normally functioning bioprosthetic aortic valve.  7. The inferior vena cava is dilated in size with <50% respiratory variability, suggesting right atrial pressure of 15 mmHg. FINDINGS  Left Ventricle: Left ventricular ejection fraction, by estimation, is 50 to 55%. The left ventricle has low normal function. The left ventricle has no regional wall motion abnormalities. Definity contrast agent was given IV to delineate the left ventricular endocardial borders. The left ventricular internal cavity size was normal in size. There is no left ventricular hypertrophy. Left ventricular diastolic parameters are consistent with Grade II diastolic dysfunction (pseudonormalization). Right Ventricle: The right ventricular size is mildly enlarged. No increase in right ventricular wall thickness. Right ventricular systolic function is mildly reduced. There is normal pulmonary artery systolic pressure. The tricuspid regurgitant velocity  is 1.50 m/s, and with an assumed right atrial pressure of 8 mmHg, the estimated right ventricular systolic pressure is 17.0 mmHg. Left Atrium: Left atrial size was mildly dilated. Right Atrium: Right atrial size was mildly dilated. Pericardium: There is no evidence of pericardial effusion. Mitral Valve: The mitral valve is degenerative in appearance. There is moderate calcification of the mitral valve leaflet(s). Moderate to severe mitral annular calcification. Mild mitral valve regurgitation. Mild to moderate mitral valve stenosis. MV peak  gradient, 15.2 mmHg. The mean mitral valve gradient is 4.0 mmHg. Tricuspid Valve: The  tricuspid valve is normal in structure. Tricuspid valve regurgitation is trivial. Aortic Valve: Bioprosthetic aortic valve s/p TAVR with 29 mm Edwards Sapien THV. Mean gradient 6 mmHg, dimensionless index 0.5. No significant perivalvular leakage. Normally functioning bioprosthetic aortic valve. The aortic valve has been repaired/replaced. Aortic valve regurgitation is not visualized. Aortic valve mean gradient measures 6.0 mmHg. Aortic valve peak gradient measures 9.3 mmHg. Aortic valve area, by VTI measures 3.06 cm. There is a 29 mm Sapien prosthetic, stented (TAVR) valve present in the aortic position. Procedure Date: 06/02/22. Pulmonic Valve: The pulmonic valve was normal in structure. Pulmonic valve regurgitation is trivial. Aorta: The aortic root is normal in size and structure. Venous: The inferior vena cava is dilated in size with less than 50% respiratory variability, suggesting right atrial pressure of 15 mmHg. IAS/Shunts: No atrial level shunt detected by color flow Doppler. Additional Comments: A device lead is visualized in the right ventricle.  LEFT VENTRICLE PLAX 2D LVIDd:         4.95 cm      Diastology LVIDs:         3.55 cm      LV e' medial:    3.94 cm/s LV PW:         1.05 cm      LV E/e' medial:  46.2 LV IVS:        1.05 cm      LV e' lateral:   7.18 cm/s LVOT diam:     2.70 cm      LV E/e' lateral: 25.3 LV SV:         113 LV SV Index:   58 LVOT Area:     5.73 cm  LV Volumes (MOD) LV vol d, MOD A2C: 102.0 ml LV vol d, MOD A4C: 103.0 ml LV vol s, MOD A2C: 41.0 ml LV vol s, MOD A4C: 41.9 ml LV SV MOD A2C:     61.0 ml LV SV MOD A4C:     103.0 ml LV SV MOD BP:      61.1 ml RIGHT VENTRICLE RV S prime:     8.12 cm/s TAPSE (M-mode): 1.2 cm LEFT ATRIUM             Index        RIGHT ATRIUM           Index LA diam:        5.10 cm 2.63 cm/m   RA Area:     19.30 cm LA Vol (A2C):   60.4 ml 31.13 ml/m  RA Volume:   50.30 ml  25.92 ml/m LA Vol (A4C):   63.9 ml 32.93 ml/m LA Biplane Vol: 65.6 ml 33.81  ml/m  AORTIC VALVE                     PULMONIC VALVE AV Area (Vmax):    3.03 cm      PR End Diast Vel: 6.25 msec AV Area (Vmean):   2.93 cm AV Area (VTI):     3.06 cm AV Vmax:           152.67 cm/s AV Vmean:          110.667 cm/s AV VTI:            0.368 m AV Peak Grad:      9.3 mmHg AV Mean Grad:      6.0  mmHg LVOT Vmax:         80.90 cm/s LVOT Vmean:        56.600 cm/s LVOT VTI:          0.197 m LVOT/AV VTI ratio: 0.53  AORTA Ao Root diam: 2.90 cm Ao Asc diam:  3.30 cm MITRAL VALVE                  TRICUSPID VALVE MV Area (PHT): 2.73 cm       TR Peak grad:   9.0 mmHg MV Area VTI:   1.88 cm       TR Vmax:        150.00 cm/s MV Peak grad:  15.2 mmHg MV Mean grad:  4.0 mmHg       SHUNTS MV Vmax:       1.95 m/s       Systemic VTI:  0.20 m MV Vmean:      84.4 cm/s      Systemic Diam: 2.70 cm MV Decel Time: 278 msec MR Peak grad:    135.0 mmHg MR Mean grad:    84.0 mmHg MR Vmax:         581.00 cm/s MR Vmean:        424.0 cm/s MR PISA:         1.57 cm MR PISA Eff ROA: 10 mm MR PISA Radius:  0.50 cm MV E velocity: 182.00 cm/s MV A velocity: 86.80 cm/s MV E/A ratio:  2.10 Dalton McleanMD Electronically signed by Wilfred Lacy Signature Date/Time: 07/07/2023/4:25:08 PM    Final     ASSESSMENT & PLAN Matthew [name] 78 y.o. male with medical history significant for vitamin B12 deficiency who presents for a follow up visit.  After review of the labs, review of the records, and discussion with the patient the patients findings are most consistent with normocytic anemia of unclear etiology and elevated serum free light chain ratio.  At this time I have a low suspicion for AL amyloidosis as the cause of his findings cardiac findings.    # Normocytic Anemia, Chronic # Vitamin B12 Borderline Low  --Prior nutritional panel revealed a vitamin B12 of 200, borderline low. -- Amyloidosis and multiple myeloma workup as noted below -- If no clear etiology can be found in hemoglobin continues to drop would  recommend considering a bone marrow biopsy. --Labs today show modest improvement with white blood cell 6.1, hemoglobin 11.7, MCV 86, platelets 214 --Continue p.o. vitamin 1000 mcg p.o. daily. Patient has PO iron pills and notes he will start them as his iron levels are borderline.  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 -- No evidence of monoclonal gammopathy, very unlikely probability of his cardiac findings representing AL amyloidosis.  No orders of the defined types were placed in this encounter.   All questions were answered. The patient knows to call the clinic with any problems, questions or concerns.  A total of more than 30 minutes were spent on this encounter with face-to-face time and non-face-to-face time, including preparing to see the patient, ordering tests and/or medications, counseling the patient and coordination of care as outlined above.   Ulysees Barns, MD Department of Hematology/Oncology [facility] at [facility] Phone: [phone] Pager:  [phone] Email: [email or web address]  07/21/2023 11:38 AM

## 2023-07-22 ENCOUNTER — Ambulatory Visit: Admitting: Family Medicine

## 2023-07-22 VITALS — BP 132/74 | HR 71 | Temp 98.2°F | Ht 66.0 in | Wt 194.4 lb

## 2023-07-22 DIAGNOSIS — G8929 Other chronic pain: Secondary | ICD-10-CM

## 2023-07-22 DIAGNOSIS — E785 Hyperlipidemia, unspecified: Secondary | ICD-10-CM

## 2023-07-22 DIAGNOSIS — H3412 Central retinal artery occlusion, left eye: Secondary | ICD-10-CM | POA: Diagnosis not present

## 2023-07-22 DIAGNOSIS — H5462 Unqualified visual loss, left eye, normal vision right eye: Secondary | ICD-10-CM

## 2023-07-22 DIAGNOSIS — M545 Low back pain, unspecified: Secondary | ICD-10-CM

## 2023-07-22 DIAGNOSIS — I6529 Occlusion and stenosis of unspecified carotid artery: Secondary | ICD-10-CM

## 2023-07-22 NOTE — Progress Notes (Signed)
 Subjective:  Patient ID: Matthew [name], male    DOB: 11-06-1945  Age: 78 y.o. MRN: [id number]  CC:  Chief Complaint  Patient presents with   Results    Pt notes he is here to discuss results, declined any blood draws today     HPI Matthew [name] presents for   Central retinal artery occlusion of left eye Discussed at his March 6 visit.  Loss of vision for 2 to 3 weeks prior, evaluated by ophthalmology on 06/28/2023 and diagnosed with central retinal artery occlusion, treated with anterior chamber tap along with a Avastin to help reperfuse.  Follow-up with me for nonemergent workup of recent stroke of left eye.   He denied symptoms of giant cell arteritis, specifically no recent headache or jaw pain/claudication symptoms. He is followed by cardiology and electrophysiology with history of atrial fibrillation on chronic anticoagulation with Eliquis.  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 Elberta Fortis, prior aortic valve replacement in February 2024,  CAD with prior bypass grafting.  Previous ultrasound of carotids in 2018 with heterogeneous plaquing bilaterally 1 to 39% bilateral ICA stenosis, normal subclavian arteries bilaterally and patent vertebral arteries with antegrade flow.  Repeat carotid Dopplers 07/08/2023.  Minimal heterogeneous and calcified plaque with no hemodynamically significant stenosis in the extracranial cerebrovascular circulation on the right.  Peak velocity suggested 50 to 69% stenosis with heterogeneous and partially calcified plaque on the left carotid bifurcation with discordant results regarding degree of stenosis.  ICA/CCA ratio suggested a lesser degree of stenosis.  Option of cerebral angiogram or CTA for further definition.  Notation of asymmetric upper extremity blood pressure, lower on the left and in office assessment to evaluate for possible developing left subclavian artery stenosis.  He does have an MRA without contrast  scheduled in early April.  Heart care follow-up on March 14. Echo on 07/07/2023 showed EF 50 to 55% with mild RVE, normally functioning TAVR, no PVL and moderate MAC with mild to moderate MS/MR. Blood pressures were independently checked at 150/70 on both arms given recent concerns on Doppler.  Continued on lisinopril 5 mg daily for renal protection given history of type 2 diabetes and home monitoring of blood pressures with MyChart messaging in 2 weeks recommended with option to increase ACE inhibitor.  Euvolemic at that visit with Lasix/Klor-Con about twice per month.  Asymptomatic with CAD, continued on Eliquis, has not tolerated statins.  LDL 123 on March 6. Normal C-reactive protein, mildly elevated sed rate on March 6 at 56.  Briefly discussed with neurology given lack of giant cell arteritis symptoms and did not feel that further workup with temporal artery biopsy indicated.  Office visit with oncology/hematology, Dr. Leonides Schanz yesterday.  Normocytic anemia of unclear etiology and elevated serum free light chain ratio.  Low suspicion for AL amyloidosis.  Option of bone marrow biopsy if hemoglobin continues to drop without clear etiology.  No evidence of monoclonal gammopathy.  No evidence of M protein in the urine or blood.  Continued on vitamin B12 and iron supplements and 57-month follow-up with labs planned.   Eyesight has improved. Still some darkening. BP at home 130/60 range.  Elevated LDL discussed, declines statin or PCSK9 inhibitor.  Followed by endocrinology - Dr. Erroll Luna. Adjusting insulin doses.   Lab Results  Component Value Date   HGBA1C 7.9 (A) 05/18/2023   BP Readings from Last 3 Encounters:  07/22/23 132/74  07/21/23 134/63  07/09/23 (!) 150/70  Chronic back pain Has been followed by [facility]. Lumbar DDD.  No surgery. Few injections over the years. None recent.  No relief with PT last fall.  Pain limits activity.  MRI 11/2022: IMPRESSION: 1. Multilevel degenerative  changes of the lumbar spine with mild-to-moderate canal stenosis at L3-4 and L4-5. 2. Severe right foraminal stenosis at L4-5 and L5-S1. Moderate-severe left foraminal stenosis at L3-4 and L4-5.     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   Severe aortic stenosis 04/13/2022   Aortic stenosis 03/27/2022   Nonrheumatic aortic valve stenosis 03/13/2022   Mitral stenosis 03/13/2022   Myopathy, unspecified 07/09/2020   Statin myopathy 09/15/2019   Atrial flutter (HCC) 10/31/2014   Coronary artery disease    Paroxysmal atrial flutter (HCC)    Diabetes mellitus without complication (HCC)    Valvular heart disease    Atrial fibrillation (HCC) 01/25/2014   Diabetes mellitus, type 2 (HCC) 01/16/2014   Hyperlipidemia 01/16/2014   Erectile dysfunction associated with type 2 diabetes mellitus (HCC) 01/16/2014   Coronary atherosclerosis of native coronary artery 06/30/2013   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 05/2009 CABG x 3: LIMA->LAD, VG->OM, VG->PDA; b. Nuc 01/2014: inf-lat scar but no ischemia, EF 57%.   Diabetic retinopathy (HCC)    mild- Dr. [name]   Glaucoma    Hyperlipidemia    a. Intolerant of lipitor and vytorin.   Lyme disease    Migraine    "once or twice" (11/20/2015)   Mitral stenosis    Mild to  moderate with mean mitral valve gradient 4 mmHg by echo 06/2023   Myocardial infarction [facility])    'saw evidence of it on an EKG done in 2005"   Paroxysmal atrial flutter (HCC)    a. 01/2014 s/p DCCV;  b. CHA2DS2VASc = 3-->Eliquis.   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 Clifton James and Dr. Laneta Simmers   Sacral pain    "right"   Sciatica of right side    Testicular cancer (HCC) 1982   "heavy doses of radiation; it was stage 2"   Type II diabetes mellitus (HCC)    Valvular heart disease    a. 01/2014 Echo: Ef 60-65%, no rwma, mild AI/MS, mod MR, mildly dil LA.   Past Surgical History:  Procedure Laterality Date   ABDOMINAL EXPLORATION SURGERY  ~ 2005   "for hernia, but didn't have one"   ATRIAL FIBRILLATION ABLATION N/A 09/17/2016   Procedure: Atrial Fibrillation Ablation;  Surgeon: Regan Lemming, MD;  Location: [facility] INVASIVE CV LAB;  Service: Cardiovascular;  Laterality: N/A;   CARDIAC CATHETERIZATION  2011   CARDIOVERSION N/A 02/19/2014   Procedure: CARDIOVERSION;  Surgeon: Lesleigh Noe, MD;  Location: [facility] ENDOSCOPY;  Service: Cardiovascular;  Laterality: N/A;   CARDIOVERSION N/A 10/28/2015   Procedure: CARDIOVERSION;  Surgeon: Vesta Mixer, MD;  Location: [facility] ENDOSCOPY;  Service: Cardiovascular;  Laterality: N/A;   CARDIOVERSION  11/20/2015   "200 joules"   CARDIOVERSION N/A 07/02/2022   Procedure: CARDIOVERSION;  Surgeon: Jodelle Red, MD;  Location: [facility] ENDOSCOPY;  Service: Cardiovascular;  Laterality: N/A;   CARDIOVERSION N/A 11/10/2022   Procedure: CARDIOVERSION;  Surgeon: Thurmon Fair, MD;  Location: [facility] INVASIVE CV LAB;  Service: Cardiovascular;  Laterality: N/A;   CORONARY ARTERY BYPASS GRAFT  05/2009   LIMA-LAD, SVG-OM, SVG-PDA 06/20/09   ELBOW FRACTURE SURGERY Left    broken ulna on left elbow-surgical repair   ELECTROPHYSIOLOGIC STUDY N/A 10/31/2014   Procedure: A-Flutter;  Surgeon: Marinus Maw, MD;  Location: [facility] INVASIVE CV LAB;  Service: Cardiovascular;  Laterality: N/A;   ELECTROPHYSIOLOGIC STUDY N/A 11/20/2015   Procedure: A-Flutter Ablation;  Surgeon: Marinus Maw, MD;  Location: [facility] INVASIVE CV LAB;  Service: Cardiovascular;  Laterality: N/A;   EYE SURGERY     FRACTURE SURGERY     INTRAOPERATIVE TRANSTHORACIC ECHOCARDIOGRAM N/A 06/02/2022   Procedure: INTRAOPERATIVE TRANSTHORACIC ECHOCARDIOGRAM;  Surgeon: Kathleene Hazel, MD;  Location: [facility] OR;  Service: Open Heart Surgery;  Laterality: N/A;   PACEMAKER IMPLANT N/A 06/03/2022   Procedure: PACEMAKER IMPLANT;  Surgeon: Regan Lemming, MD;  Location: [facility] INVASIVE CV LAB;  Service: Cardiovascular;  Laterality: N/A;   RIGHT/LEFT HEART CATH AND CORONARY/GRAFT ANGIOGRAPHY N/A 04/13/2022   Procedure: RIGHT/LEFT HEART CATH AND CORONARY/GRAFT ANGIOGRAPHY;  Surgeon: Kathleene Hazel, MD;  Location: [facility] INVASIVE CV LAB;  Service: Cardiovascular;  Laterality: N/A;   TEE WITHOUT CARDIOVERSION N/A 03/13/2022   Procedure: TRANSESOPHAGEAL ECHOCARDIOGRAM (TEE);  Surgeon: Thurmon Fair, MD;  Location: [facility] ENDOSCOPY;  Service: Cardiovascular;  Laterality: N/A;   TESTICLE REMOVAL Right 1982   TONSILLECTOMY  ~ 1967   TRANSCATHETER AORTIC VALVE REPLACEMENT, TRANSFEMORAL N/A 06/02/2022   Procedure: Transcatheter Aortic Valve Replacement, Transfemoral;  Surgeon: Kathleene Hazel, MD;  Location: [facility] OR;  Service: Open Heart Surgery;  Laterality: N/A;   Allergies  Allergen Reactions   Bee Venom Swelling and Other (See Comments)    honey bee venom   Codeine Hives, Nausea Only and Rash    codeine   Pioglitazone Swelling and Other (See Comments)    pioglitazone   Metformin Nausea Only   Statins Other (See Comments)    Muscle pain, fatigue   Raspberry Itching and Hives   Prior to Admission medications   Medication Sig Start Date End Date Taking? Authorizing Provider  apixaban (ELIQUIS) 5 MG TABS tablet Take 1 tablet (5  mg total) by mouth 2 (two) times daily. 07/19/23  Yes Janetta Hora, PA-C  BD VEO INSULIN SYRINGE U/F 31G X 15/64" 0.5 ML MISC USE TO INJECT 4 TIMES A DAY 11/03/22  Yes Reather Littler, MD  Continuous Blood Gluc Sensor (DEXCOM G7 SENSOR) MISC by Does not apply route.   Yes [provider]  cyanocobalamin (VITAMIN B12) 1000 MCG tablet Take 1,000 mcg by mouth daily.   Yes [provider]  dorzolamide-timolol (COSOPT) 22.3-6.8 MG/ML ophthalmic solution Place 1 drop into the left eye 2 (two) times daily. 10/01/21  Yes [provider]  furosemide (LASIX) 40 MG tablet Take 40 mg by mouth as needed for fluid or edema.   Yes [provider]  insulin NPH Human (NOVOLIN N) 100 UNIT/ML injection Inject 0.2-0.25 mLs (20-25 Units total) into the skin at bedtime.  05/03/23  Yes Thapa, [place], MD  lisinopril (ZESTRIL) 5 MG tablet TAKE 1 TABLET BY MOUTH EVERY DAY 09/16/22  Yes Camnitz, Will Daphine Deutscher, MD  NOVOLOG 100 UNIT/ML injection INJECT 15-20 UNITS INTO THE SKIN 3 (THREE) TIMES DAILY WITH MEALS. SLIDING SCALE 11/17/22  Yes Reather Littler, MD  potassium chloride (KLOR-CON) 10 MEQ tablet TAKE 1 TABLET BY MOUTH EVERY DAY Patient taking differently: Take 10 mEq by mouth daily. As needed with Furosemide 04/23/23  Yes [name], MD   Social History   Socioeconomic History   Marital status: Married    Spouse name: Not on file   Number of children: 0   Years of education: Not on file   Highest education level: Bachelor's degree (e.g., BA, AB, BS)  Occupational History   Occupation: Insurance account manager  Tobacco Use   Smoking status: Former    Current packs/day: 0.00    Average packs/day: 0.5 packs/day for 15.0 years (7.5 ttl pk-yrs)    Types: Cigarettes    Start date: 72    Quit date: 1990    Years since quitting: 35.2   Smokeless tobacco: Never   Tobacco comments:    "quit smoking cigarettes in the 1980's; don't know how much or for how long"  Vaping Use   Vaping status:  Never Used  Substance and Sexual Activity   Alcohol use: Yes    Alcohol/week: 3.0 standard drinks of alcohol    Types: 3 Cans of beer per week   Drug use: No    Comment: "quit smoking pot in the 1990s"   Sexual activity: Not Currently    Birth control/protection: Coitus interruptus  Other Topics Concern   Not on file  Social History Narrative   Not on file   Social Drivers of Health   Financial Resource Strain: Patient Declined (06/30/2023)   Overall Financial Resource Strain (CARDIA)    Difficulty of Paying Living Expenses: Patient declined  Food Insecurity: Patient Declined (06/30/2023)   Hunger Vital Sign    Worried About Running Out of Food in the Last Year: Patient declined    Ran Out of Food in the Last Year: Patient declined  Transportation Needs: No Transportation Needs (06/30/2023)   PRAPARE - Administrator, Civil Service (Medical): No    Lack of Transportation (Non-Medical): No  Physical Activity: Unknown (06/30/2023)   Exercise Vital Sign    Days of Exercise per Week: 0 days    Minutes of Exercise per Session: Not on file  Stress: No Stress Concern Present (06/30/2023)   [employer] of Occupational Health - Occupational Stress Questionnaire    Feeling of Stress : Not at all  Social Connections: Unknown (06/30/2023)   Social Connection and Isolation Panel [NHANES]    Frequency of Communication with Friends and Family: More than three times a week    Frequency of Social Gatherings with Friends and Family: Three times a week    Attends Religious Services: Patient declined    Active Member of Clubs or Organizations: No    Attends Banker Meetings: Not on file    Marital Status: Married  Intimate Partner Violence: Not At Risk (03/28/2022)   Humiliation, Afraid, Rape, and Kick questionnaire    Fear of Current or Ex-Partner: No    Emotionally Abused: No    Physically Abused: No    Sexually Abused: No    Review of Systems Per HPI  Objective:    Vitals:   07/22/23 1134  BP:  132/74  Pulse: 71  Temp: 98.2 F (36.8 C)  TempSrc: Temporal  SpO2: 99%  Weight: 194 lb 6.4 oz (88.2 kg)  Height: 5\' 6"  (1.676 m)     Physical Exam Vitals reviewed.  Constitutional:      Appearance: He is well-developed.  HENT:     Head: Normocephalic and atraumatic.  Neck:     Vascular: No carotid bruit or JVD.  Cardiovascular:     Rate and Rhythm: Normal rate and regular rhythm.     Heart sounds: Normal heart sounds. No murmur heard. Pulmonary:     Effort: Pulmonary effort is normal.     Breath sounds: Normal breath sounds. No rales.  Musculoskeletal:     Right lower leg: No edema.     Left lower leg: No edema.  Skin:    General: Skin is warm and dry.  Neurological:     General: No focal deficit present.     Mental Status: He is alert and oriented to person, place, and time.     Comments: Equal grip strength, no pronator drift.   Psychiatric:        Mood and Affect: Mood normal.      68 minutes spent during visit, including chart review, counseling and assimilation of information, exam, discussion of plan, and chart completion, including review of previous testing for central retinal artery stenosis and plan moving forward along with back pain discussion.  Assessment & Plan:  Matthew [name] is a 78 y.o. male . Central retinal artery occlusion of left eye Vision loss, left eye Hyperlipidemia, unspecified hyperlipidemia type  -Vision improving.  Continue follow-up with ophthalmology as planned.  So far workup of above underlying possible risk factors have been overall reassuring.  We did discuss hyperlipidemia, declines any new treatment at this time.  Discordant data on ultrasound of the left carotid, will add MRA neck for upcoming MRA head after discussion with radiologist, this will need to be with contrast.  Order added.  Continue follow-up with other specialist as planned.  Chronic low back pain without sciatica,  unspecified back pain laterality  -Recommended discussion with his previous back specialist to determine if injection, or other treatment options available as no improvement with PT and some limitations in activity due to his back pain.  Would also consider medication like Cymbalta, or pain management eval depending on evaluation with back specialist.   No orders of the defined types were placed in this encounter.  Patient Instructions  I will check into the MRA of brain and to see if they need an additional order for neck vessels. Keep follow up with other specialists as planned.   I do recommend discussing options for your back with Dr. Shon Baton to see if injection or other treatments may help. Happy to follow up to discuss other options if needed.   Take care.     Signed,   Meredith Staggers, MD [facility], [facility] 07/22/23 12:41 PM

## 2023-07-22 NOTE — Patient Instructions (Addendum)
 I will check into the MRA of brain and to see if they need an additional order for neck vessels. Keep follow up with other specialists as planned.   I do recommend discussing options for your back with Dr. Shon Baton to see if injection or other treatments may help. Happy to follow up to discuss other options if needed.   Take care.

## 2023-07-24 ENCOUNTER — Encounter: Payer: Self-pay | Admitting: Family Medicine

## 2023-07-26 DIAGNOSIS — Z961 Presence of intraocular lens: Secondary | ICD-10-CM | POA: Diagnosis not present

## 2023-07-26 DIAGNOSIS — H3412 Central retinal artery occlusion, left eye: Secondary | ICD-10-CM | POA: Diagnosis not present

## 2023-07-26 DIAGNOSIS — H401131 Primary open-angle glaucoma, bilateral, mild stage: Secondary | ICD-10-CM | POA: Diagnosis not present

## 2023-07-26 DIAGNOSIS — E113511 Type 2 diabetes mellitus with proliferative diabetic retinopathy with macular edema, right eye: Secondary | ICD-10-CM | POA: Diagnosis not present

## 2023-07-26 DIAGNOSIS — Z794 Long term (current) use of insulin: Secondary | ICD-10-CM | POA: Diagnosis not present

## 2023-07-26 DIAGNOSIS — H35352 Cystoid macular degeneration, left eye: Secondary | ICD-10-CM | POA: Diagnosis not present

## 2023-07-26 DIAGNOSIS — H211X2 Other vascular disorders of iris and ciliary body, left eye: Secondary | ICD-10-CM | POA: Diagnosis not present

## 2023-07-26 DIAGNOSIS — H35372 Puckering of macula, left eye: Secondary | ICD-10-CM | POA: Diagnosis not present

## 2023-07-26 DIAGNOSIS — E113512 Type 2 diabetes mellitus with proliferative diabetic retinopathy with macular edema, left eye: Secondary | ICD-10-CM | POA: Diagnosis not present

## 2023-07-26 DIAGNOSIS — H35342 Macular cyst, hole, or pseudohole, left eye: Secondary | ICD-10-CM | POA: Diagnosis not present

## 2023-07-27 ENCOUNTER — Ambulatory Visit (HOSPITAL_COMMUNITY)
Admission: RE | Admit: 2023-07-27 | Discharge: 2023-07-27 | Disposition: A | Discharge status: Home / Self Care | Source: Ambulatory Visit | Attending: Family Medicine | Admitting: Family Medicine

## 2023-07-27 DIAGNOSIS — I6529 Occlusion and stenosis of unspecified carotid artery: Secondary | ICD-10-CM | POA: Diagnosis not present

## 2023-07-27 DIAGNOSIS — H5462 Unqualified visual loss, left eye, normal vision right eye: Secondary | ICD-10-CM | POA: Diagnosis not present

## 2023-07-27 DIAGNOSIS — H3412 Central retinal artery occlusion, left eye: Secondary | ICD-10-CM | POA: Diagnosis not present

## 2023-07-27 DIAGNOSIS — I4891 Unspecified atrial fibrillation: Secondary | ICD-10-CM

## 2023-07-27 MED ORDER — GADOBUTROL 1 MMOL/ML IV SOLN
9.0000 mL | Freq: Once | INTRAVENOUS | Status: AC | PRN
  Administered 2023-07-27: 9 mL via INTRAVENOUS

## 2023-07-27 NOTE — Progress Notes (Signed)
  Device system confirmed to be MRI conditional, with implant date > 6 weeks ago, and no evidence of abandoned or epicardial leads in review of most recent CXR  Device last cleared by EP Provider: Canary Brim NP   Clearance is good through for 1 year as long as parameters remain stable at time of check. If pt undergoes a cardiac device procedure during that time, they should be re-cleared.   Tachy-therapies to be programmed off if applicable with device back to pre-MRI settings after completion of exam.  [employer] will be present for programming for the MRI.   Terri Piedra  07/27/2023 10:26 AM

## 2023-07-29 LAB — METHYLMALONIC ACID, SERUM: Methylmalonic Acid, Quantitative: 218 nmol/L (ref 0–378)

## 2023-07-30 ENCOUNTER — Encounter: Payer: Self-pay | Admitting: Family Medicine

## 2023-08-17 ENCOUNTER — Other Ambulatory Visit: Payer: Medicare HMO

## 2023-08-19 ENCOUNTER — Encounter: Payer: Self-pay | Admitting: Endocrinology

## 2023-08-19 ENCOUNTER — Ambulatory Visit: Payer: Medicare HMO | Admitting: Endocrinology

## 2023-08-19 VITALS — BP 130/70 | HR 81 | Resp 20 | Ht 66.0 in | Wt 199.2 lb

## 2023-08-19 DIAGNOSIS — E1165 Type 2 diabetes mellitus with hyperglycemia: Secondary | ICD-10-CM

## 2023-08-19 DIAGNOSIS — Z794 Long term (current) use of insulin: Secondary | ICD-10-CM

## 2023-08-19 MED ORDER — SEMAGLUTIDE(0.25 OR 0.5MG/DOS) 2 MG/3ML ~~LOC~~ SOPN: PEN_INJECTOR | SUBCUTANEOUS | 4 refills | Status: DC

## 2023-08-19 NOTE — Progress Notes (Signed)
 Outpatient Endocrinology Note [name], MD  08/19/23  Patient's Name: Matthew [name]    DOB: 12-30-1945    MRN: [id number]                                                    REASON OF VISIT: Follow up for type 2 diabetes mellitus  PCP: Benjiman Bras, MD  HISTORY OF PRESENT ILLNESS:   Matthew [name] is a 78 y.o. old male with past medical history listed below, is here for follow up of type 2 diabetes mellitus.   Pertinent Diabetes History: Patient was diagnosed with type 2 diabetes mellitus in 1992.  He was initially treated with Glucophage.  He was treated with premixed insulin  for several years 15+ years.  Chronic Diabetes Complications : Retinopathy: yes, following with ophthalmology , reportedly. Retinal detachment, following with Dr. Katheleen Palmer Nephropathy: CKD, on lisinopril , history of microalbuminuria. Peripheral neuropathy: no Coronary artery disease: yes, s/p CABG Stroke: no  Relevant comorbidities and cardiovascular risk factors: Obesity: yes Body mass index is 32.15 kg/m.  Hypertension: yes Hyperlipidemia. Yes,  Lipids: He was previously on Vytorin and Lipitor. He thinks he had myalgias and weakness when taking these drugs. He has been recommended statin drugs in the lipid clinic but he refuses to take them stating that they will not help him. Also has significantly increased LDL particle number. He was prescribed Praluent  at the cardiology clinic but he refused this. Has hypercholesterolemia and history of coronary artery bypass surgery. He was also suggested Leqvio previously and he refused  Current / Home Diabetic regimen includes:  Novolin N 15 units with BF and 7-10 units at supper, he sometimes adjust dose of Novolin N. Novolog  5-15 units with meals three times a day plus sliding scale.  He sometimes correct for hyperglycemia at bedtime with NovoLog .  He prefers to stay on NPH insulin  instead of basal insulin  like Lantus .  He thinks NPH works  better for him.  Prior diabetic medications: Glucophage/metformin, ?  Nausea/malaise. Actos, edema. Farxiga  was suggested in the past-never started due to concern of side effects.  Glycemic data:    CONTINUOUS GLUCOSE MONITORING SYSTEM (CGMS) INTERPRETATION: At today's visit, we reviewed CGM downloads. The full report is scanned in the media. Reviewing the CGM trends, blood glucose are as follows:  Dexcom G7 CGM-  Sensor Download (Sensor download was reviewed and summarized below.) Dates: April 11 to April 24 , 2025, 14 days  Glucose Management Indicator: 7.6%    Interpretation: Variable blood sugar.  Mostly hyperglycemia with blood sugar up to 250 range especially with breakfast, sometime with lunch and supper and rarely overnight starting from bedtime.  Blood sugars trending down with occasional mild hypoglycemia with blood sugar in 60s after use of mealtime insulin .  Most of the time acceptable blood sugar overnight and in between the meals.  No concerning hypoglycemia.  Hypoglycemia: Patient has minor hypoglycemic episodes. Patient has hypoglycemia awareness.  Factors modifying glucose control: 1.  Diabetic diet assessment: 2-3 meals a day.  2.  Staying active or exercising: Limited physical activity decreased exercise,  Significantly.  3.  Medication compliance: compliant most of the time.  Interval history  CGM data as reviewed above.  Variable blood sugar.  Diabetes treatment as reviewed and noted above.  He started to take Novolin and  with supper instead of bedtime.  Hemoglobin A1c 8.6%.  Recent laboratory results reviewed with elevated LDL 142.  Urine microalbumin creatinine ratio +72.  No other complaints today.  Recent lab results reviewed C-peptide is low at 0.64 with glucose of 150.  Type 1 diabetes autoimmune antibodies results are awaited.  REVIEW OF SYSTEMS As per history of present illness.   PAST MEDICAL HISTORY: Past Medical History:  Diagnosis Date    Anemia    Arthritis    "back, left ankle" (11/20/2015)   Cataract    CHF (congestive heart failure) (HCC)    Chronic lower back pain    Coronary artery disease    a. 05/2009 CABG x 3: LIMA->LAD, VG->OM, VG->PDA; b. Nuc 01/2014: inf-lat scar but no ischemia, EF 57%.   Diabetic retinopathy (HCC)    mild- Dr. Lasandra Points   Glaucoma    Hyperlipidemia    a. Intolerant of lipitor and vytorin.   Lyme disease    Migraine    "once or twice" (11/20/2015)   Mitral stenosis    Mild to moderate with mean mitral valve gradient 4 mmHg by echo 06/2023   Myocardial infarction [facility])    'saw evidence of it on an EKG done in 2005"   Paroxysmal atrial flutter (HCC)    a. 01/2014 s/p DCCV;  b. CHA2DS2VASc = 3-->Eliquis .   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 Abel Hoe and Dr. Sherene Dilling   Sacral pain    "right"   Sciatica of right side    Testicular cancer (HCC) 1982   "heavy doses of radiation; it was stage 2"   Type II diabetes mellitus (HCC)    Valvular heart disease    a. 01/2014 Echo: Ef 60-65%, no rwma, mild AI/MS, mod MR, mildly dil LA.    PAST SURGICAL HISTORY: Past Surgical History:  Procedure Laterality Date   ABDOMINAL EXPLORATION SURGERY  ~ 2005   "for hernia, but didn't have one"   ATRIAL FIBRILLATION ABLATION N/A 09/17/2016   Procedure: Atrial Fibrillation Ablation;  Surgeon: Lei Pump, MD;  Location: [facility] INVASIVE CV LAB;  Service: Cardiovascular;  Laterality: N/A;   CARDIAC CATHETERIZATION  2011   CARDIOVERSION N/A 02/19/2014   Procedure: CARDIOVERSION;  Surgeon: Mickiel Albany, MD;  Location: [facility] ENDOSCOPY;  Service: Cardiovascular;  Laterality: N/A;   CARDIOVERSION N/A 10/28/2015   Procedure: CARDIOVERSION;  Surgeon: Lake Pilgrim, MD;  Location: [facility] ENDOSCOPY;  Service: Cardiovascular;  Laterality: N/A;   CARDIOVERSION  11/20/2015   "200 joules"   CARDIOVERSION N/A 07/02/2022   Procedure:  CARDIOVERSION;  Surgeon: Sheryle Donning, MD;  Location: [facility] ENDOSCOPY;  Service: Cardiovascular;  Laterality: N/A;   CARDIOVERSION N/A 11/10/2022   Procedure: CARDIOVERSION;  Surgeon: Luana Rumple, MD;  Location: [facility] INVASIVE CV LAB;  Service: Cardiovascular;  Laterality: N/A;   CORONARY ARTERY BYPASS GRAFT  05/2009   LIMA-LAD, SVG-OM, SVG-PDA 06/20/09   ELBOW FRACTURE SURGERY Left    broken ulna on left elbow-surgical repair   ELECTROPHYSIOLOGIC STUDY N/A 10/31/2014   Procedure: A-Flutter;  Surgeon: Tammie Fall, MD;  Location[facility] INVASIVE CV LAB;  Service: Cardiovascular;  Laterality: N/A;   ELECTROPHYSIOLOGIC STUDY N/A 11/20/2015   Procedure: A-Flutter Ablation;  Surgeon: Tammie Fall, MD;  Location: [facility] INVASIVE CV LAB;  Service: Cardiovascular;  Laterality: N/A;   EYE SURGERY     FRACTURE SURGERY     INTRAOPERATIVE TRANSTHORACIC ECHOCARDIOGRAM N/A 06/02/2022  Procedure: INTRAOPERATIVE TRANSTHORACIC ECHOCARDIOGRAM;  Surgeon: Odie Benne, MD;  Location: [facility] OR;  Service: Open Heart Surgery;  Laterality: N/A;   PACEMAKER IMPLANT N/A 06/03/2022   Procedure: PACEMAKER IMPLANT;  Surgeon: Lei Pump, MD;  Location: [facility] INVASIVE CV LAB;  Service: Cardiovascular;  Laterality: N/A;   RIGHT/LEFT HEART CATH AND CORONARY/GRAFT ANGIOGRAPHY N/A 04/13/2022   Procedure: RIGHT/LEFT HEART CATH AND CORONARY/GRAFT ANGIOGRAPHY;  Surgeon: Odie Benne, MD;  Location: [facility] INVASIVE CV LAB;  Service: Cardiovascular;  Laterality: N/A;   TEE WITHOUT CARDIOVERSION N/A 03/13/2022   Procedure: TRANSESOPHAGEAL ECHOCARDIOGRAM (TEE);  Surgeon: Luana Rumple, MD;  Location: [facility] ENDOSCOPY;  Service: Cardiovascular;  Laterality: N/A;   TESTICLE REMOVAL Right 1982   TONSILLECTOMY  ~ 1967   TRANSCATHETER AORTIC VALVE REPLACEMENT, TRANSFEMORAL N/A 06/02/2022   Procedure: Transcatheter Aortic Valve Replacement, Transfemoral;  Surgeon: Odie Benne, MD;  Location: [facility] OR;  Service: Open  Heart Surgery;  Laterality: N/A;    ALLERGIES: Allergies  Allergen Reactions   Bee Venom Swelling and Other (See Comments)    honey bee venom   Codeine Hives, Nausea Only and Rash    codeine   Pioglitazone Swelling and Other (See Comments)    pioglitazone   Metformin Nausea Only   Statins Other (See Comments)    Muscle pain, fatigue   Raspberry Itching and Hives    FAMILY HISTORY:  Family History  Problem Relation Age of Onset   Diabetes Mother    Heart disease Mother        Heart murmur   Heart disease Father    Heart attack Father    Diabetes Father    Hypertension Father    Diabetes Sister     SOCIAL HISTORY: Social History   Socioeconomic History   Marital status: Married    Spouse name: Not on file   Number of children: 0   Years of education: Not on file   Highest education level: Bachelor's degree (e.g., BA, AB, BS)  Occupational History   Occupation: Insurance account manager  Tobacco Use   Smoking status: Former    Current packs/day: 0.00    Average packs/day: 0.5 packs/day for 15.0 years (7.5 ttl pk-yrs)    Types: Cigarettes    Start date: 9    Quit date: 1990    Years since quitting: 35.3   Smokeless tobacco: Never   Tobacco comments:    "quit smoking cigarettes in the 1980's; don't know how much or for how long"  Vaping Use   Vaping status: Never Used  Substance and Sexual Activity   Alcohol use: Yes    Alcohol/week: 3.0 standard drinks of alcohol    Types: 3 Cans of beer per week   Drug use: No    Comment: "quit smoking pot in the 1990s"   Sexual activity: Not Currently    Birth control/protection: Coitus interruptus  Other Topics Concern   Not on file  Social History Narrative   Not on file   Social Drivers of Health   Financial Resource Strain: Patient Declined (06/30/2023)   Overall Financial Resource Strain (CARDIA)    Difficulty of Paying Living Expenses: Patient declined  Food Insecurity: Patient Declined (06/30/2023)   Hunger Vital  Sign    Worried About Running Out of Food in the Last Year: Patient declined    Ran Out of Food in the Last Year: Patient declined  Transportation Needs: No Transportation Needs (06/30/2023)   PRAPARE - Transportation    Lack of  Transportation (Medical): No    Lack of Transportation (Non-Medical): No  Physical Activity: Unknown (06/30/2023)   Exercise Vital Sign    Days of Exercise per Week: 0 days    Minutes of Exercise per Session: Not on file  Stress: No Stress Concern Present (06/30/2023)   [employer] of Occupational Health - Occupational Stress Questionnaire    Feeling of Stress : Not at all  Social Connections: Unknown (06/30/2023)   Social Connection and Isolation Panel [NHANES]    Frequency of Communication with Friends and Family: More than three times a week    Frequency of Social Gatherings with Friends and Family: Three times a week    Attends Religious Services: Patient declined    Active Member of Clubs or Organizations: No    Attends Engineer, structural: Not on file    Marital Status: Married    MEDICATIONS:  Current Outpatient Medications  Medication Sig Dispense Refill   apixaban  (ELIQUIS ) 5 MG TABS tablet Take 1 tablet (5 mg total) by mouth 2 (two) times daily. 60 tablet 5   BD VEO INSULIN  SYRINGE U/F 31G X 15/64" 0.5 ML MISC USE TO INJECT 4 TIMES A DAY 400 each 3   Continuous Blood Gluc Sensor (DEXCOM G7 SENSOR) MISC by Does not apply route.     cyanocobalamin  (VITAMIN B12) 1000 MCG tablet Take 1,000 mcg by mouth daily.     dorzolamide -timolol  (COSOPT ) 22.3-6.8 MG/ML ophthalmic solution Place 1 drop into the left eye 2 (two) times daily.     furosemide  (LASIX ) 40 MG tablet Take 40 mg by mouth as needed for fluid or edema.     insulin  NPH Human (NOVOLIN N) 100 UNIT/ML injection Inject 0.2-0.25 mLs (20-25 Units total) into the skin at bedtime. (Patient taking differently: Inject 20-25 Units into the skin at bedtime. Patient taking differently states takes  morning and at supper) 22 mL 1   lisinopril  (ZESTRIL ) 5 MG tablet TAKE 1 TABLET BY MOUTH EVERY DAY 90 tablet 3   NOVOLOG  100 UNIT/ML injection INJECT 15-20 UNITS INTO THE SKIN 3 (THREE) TIMES DAILY WITH MEALS. SLIDING SCALE 30 mL 1   potassium chloride  (KLOR-CON ) 10 MEQ tablet TAKE 1 TABLET BY MOUTH EVERY DAY (Patient taking differently: Take 10 mEq by mouth daily. As needed with Furosemide ) 90 tablet 1   Semaglutide ,0.25 or 0.5MG /DOS, 2 MG/3ML SOPN Inject 0.25 mg into the skin once a week for 4 weeks and increase to 0.5 mg weekly. 3 mL 4   No current facility-administered medications for this visit.    PHYSICAL EXAM: Vitals:   08/19/23 1005 08/19/23 1006  BP: (!) 142/70 130/70  Pulse: 81   Resp: 20   SpO2: 99%   Weight: 199 lb 3.2 oz (90.4 kg)   Height: 5\' 6"  (1.676 m)      Body mass index is 32.15 kg/m.  Wt Readings from Last 3 Encounters:  08/19/23 199 lb 3.2 oz (90.4 kg)  07/22/23 194 lb 6.4 oz (88.2 kg)  07/21/23 194 lb 14.4 oz (88.4 kg)    General: Well developed, well nourished male in no apparent distress.  HEENT: AT/[place], no external lesions.  Eyes: Conjunctiva clear and no icterus. Neck: Neck supple  Lungs: Respirations not labored Neurologic: Alert, oriented, normal speech Extremities / Skin: Dry.   Psychiatric: Does not appear depressed or anxious  Diabetic Foot Exam - Simple   No data filed    LABS Reviewed Lab Results  Component Value Date   HGBA1C 8.6 (  H) 08/17/2023   HGBA1C 7.9 (A) 05/18/2023   HGBA1C 8.0 (A) 02/15/2023   Lab Results  Component Value Date   FRUCTOSAMINE 337 (H) 10/11/2019   FRUCTOSAMINE 321 (H) 03/07/2018   FRUCTOSAMINE 381 (H) 12/10/2015   Lab Results  Component Value Date   CHOL 207 (H) 08/17/2023   HDL 43 08/17/2023   LDLCALC 142 (H) 08/17/2023   TRIG 105 08/17/2023   CHOLHDL 4.8 08/17/2023   Lab Results  Component Value Date   MICRALBCREAT 72 (H) 08/17/2023   MICRALBCREAT 11.6 12/17/2021   Lab Results  Component  Value Date   CREATININE 1.35 (H) 08/17/2023   Lab Results  Component Value Date   GFR 66.60 10/23/2021    ASSESSMENT / PLAN  1. Uncontrolled type 2 diabetes mellitus with hyperglycemia, with long-term current use of insulin  (HCC)    Diabetes Mellitus type 2, complicated by diabetic retinopathy/CAD. - Diabetic status / severity: Uncontrolled.  Lab Results  Component Value Date   HGBA1C 8.6 (H) 08/17/2023    - Hemoglobin A1c goal : < 7.0%   - Medications: Adjusted diabetes regimen as follows.  Advised to stay on fixed dose of Novolin N and unless blood sugar is significantly low or significantly high.  NovoLog  can be adjusted based on meal size and blood sugar, discussed in detail.  Added different lighter sliding scale for bedtime to correct hypoglycemia as follows.  Discussed that give 1 unit of NovoLog  for every 50 of glucose for blood sugar more than 250, not more to prevent overnight or early morning hypoglycemia.  No history of pancreatitis.  No family history of medullary thyroid  carcinoma and MEN 2 syndrome.  Would like to start GLP-1 receptor agonist.  He has coronary artery disease as well.  Patient consider to start Ozempic .  He want to review more about medication information.  Discussed in detail today about effectiveness of Ozempic , advantages and side effects.  Diabetes regimen:  Start Ozempic  0.25 mg weekly for 4 weeks and increase to 0.5 mg weekly.  Novolin N 15 units with breakfast and 7 - 10 units with supper ( not at bedtime).    Novolog  5-10 units with meals three times a day plus sliding scale.  Take 15 minutes before eating.  Discussed about especially focusing for lunch NovoLog  coverage.  Moderate Sliding Scale Blood Glucose        Insulin  60-150                     None 151-200                   3 units 201-250                   5 units 251-300                   7 units 301-350                   9 units 351-400                  11 units     >400                       12 units and call provider   Bedtime: Do not take Novolin N at bedtime. Can correct for high blood sugar with sliding scale with NovoLog  as follows.  Mild Sliding Scale Blood Glucose  Insulin  60-150                     None 151-200                   None 201-250                   0 units 251-300                   1 units 301-350                   2 units 351-400                   3 units      >400                        5 units and call provider   - Home glucose testing: Dexcom G7 and check as needed.  Discussed about advantages of using Dexcom CGM to regulate his meals and definitely to monitor blood sugar. - Discussed/ Gave Hypoglycemia treatment plan.  # Consult : not required at this time.   # Annual urine for microalbuminuria/ creatinine ratio, + microalbuminuria currently, continue ACE/ARB /lisinopril .  Consider SGLT2 inhibitor. Last  Lab Results  Component Value Date   MICRALBCREAT 72 (H) 08/17/2023    # Foot check nightly.  # Patient has diabetic retinopathy, following with ophthalmology Normie Becton specialist regularly.   - Diet: Make healthy diabetic food choices - Life style / activity / exercise: Discussed.  2. Blood pressure  -  BP Readings from Last 1 Encounters:  08/19/23 130/70    - Control is  in target.  - No change in current plans.    3. Lipid status / Hyperlipidemia - Last  Lab Results  Component Value Date   LDLCALC 142 (H) 08/17/2023   -Patient not on a statin.  Patient does not want to be on a statin therapy.  He has been following with cardiology as well.  He has coronary artery disease status post CABG.  Diagnoses and all orders for this visit:  Uncontrolled type 2 diabetes mellitus with hyperglycemia, with long-term current use of insulin  (HCC) -     Semaglutide ,0.25 or 0.5MG /DOS, 2 MG/3ML SOPN; Inject 0.25 mg into the skin once a week for 4 weeks and increase to 0.5 mg weekly.   DISPOSITION Follow up  in clinic in 3 months suggested.     All questions answered and patient verbalized understanding of the plan.  [place] Shaquasha Gerstel, MD [facility] Endocrinology [facility] [address] [place], [address] [place], [place] [address] Phone # [phone]  At least part of this note was generated using voice recognition software. Inadvertent word errors may have occurred, which were not recognized during the proofreading process.

## 2023-08-19 NOTE — Patient Instructions (Addendum)
 Latest Reference Range & Units 02/15/23 10:39 05/18/23 09:49 08/17/23 09:49  Hemoglobin A1C <5.7 % 8.0 ! Pend 7.9 ! Pend 8.6 (H)  !: Data is abnormal (H): Data is abnormally high   Start Ozempic  0.25 mg weekly for 4 weeks and increase to 0.5 mg weekly.   Novolin N 15 units with breakfast and 7-10 units with supper ( not at bedtime).  Novolog  5-10 units with meals three times a day plus sliding scale.  Take 15 minutes before eating.  Moderate Sliding Scale Blood Glucose        Insulin  60-150                     None 151-200                   3 units 201-250                   5 units 251-300                   7 units 301-350                   9 units 351-400                  11 units    >400                       12 units and call provider   Bedtime: Do not take Novolin N. Can correct for high blood sugar with sliding scale with NovoLog  as follows.  Mild Sliding Scale Blood Glucose        Insulin  60-150                     None 151-200                   None 201-250                   0 units 251-300                   1 units 301-350                   2 units 351-400                   3 units      >400                        5 units and call provider    Semaglutide  Injection What is this medication? SEMAGLUTIDE  (SEM a GLOO tide) treats type 2 diabetes. It works by increasing insulin  levels in your body, which decreases your blood sugar (glucose). It also reduces the amount of sugar released into your blood and slows down your digestion. It may also be used to lower the risk of heart attack and stroke in people with type 2 diabetes. Changes to diet and exercise are often combined with this medication. This medicine may be used for other purposes; ask your health care provider or pharmacist if you have questions. COMMON BRAND NAME(S): OZEMPIC  What should I tell my care team before I take this medication? They need to know if you have any of these conditions: Eye disease  caused by diabetes Gallbladder disease Have or have had pancreatitis Having surgery Kidney disease Personal or family history of  MEN 2, a condition that causes endocrine gland tumors Personal or family history of thyroid  cancer Stomach or intestine problems, such as problems digesting food An unusual or allergic reaction to semaglutide , other medications, foods, dyes, or preservatives Pregnant or trying to get pregnant Breastfeeding How should I use this medication? This medication is for injection under the skin of your upper leg (thigh), stomach area, or upper arm. It is given once every week (every 7 days). You will be taught how to prepare and give this medication. Use exactly as directed. Take your medication at regular intervals. Do not take it more often than directed. If you use this medication with insulin , you should inject this medication and the insulin  separately. Do not mix them together. Do not give the injections right next to each other. Change (rotate) injection sites with each injection. It is important that you put your used needles and syringes in a special sharps container. Do not put them in a trash can. If you do not have a sharps container, call your pharmacist or care team to get one. A special MedGuide will be given to you by the pharmacist with each prescription and refill. Be sure to read this information carefully each time. This medication comes with INSTRUCTIONS FOR USE. Ask your pharmacist for directions on how to use this medication. Read the information carefully. Talk to your pharmacist or care team if you have questions. Talk to your care team about the use of this medication in children. Special care may be needed. Overdosage: If you think you have taken too much of this medicine contact a poison control center or emergency room at once. NOTE: This medicine is only for you. Do not share this medicine with others. What if I miss a dose? If you miss a dose,  take it as soon as you can within 5 days after the missed dose. Then take your next dose at your regular weekly time. If it has been longer than 5 days after the missed dose, do not take the missed dose. Take the next dose at your regular time. Do not take double or extra doses. If you have questions about a missed dose, contact your care team for advice. What may interact with this medication? Some medications may affect your blood sugar levels or hide the symptoms of low blood sugar (hypoglycemia). Talk with your care team about all the medications you take. They may suggest changes to your insulin  dose or checking your blood sugar levels more often. Medications that may affect your blood sugar levels include: Alcohol Certain antibiotics, such as ciprofloxacin, levofloxacin, sulfamethoxazole; trimethoprim Certain medications for blood pressure or heart disease, such as benazepril, enalapril, lisinopril , losartan, valsartan Certain medications for mental health conditions, such as fluoxetine or olanzapine Diuretics, such as hydrochlorothiazide (HCTZ) Estrogen and progestin hormones Other medications for diabetes Steroid medications, such as prednisone  or cortisone Testosterone  Thyroid  hormones Medications that may mask symptoms of low blood sugar include: Beta blockers, such as atenolol, metoprolol , propranolol Clonidine Guanethidine Reserpine This list may not describe all possible interactions. Give your health care provider a list of all the medicines, herbs, non-prescription drugs, or dietary supplements you use. Also tell them if you smoke, drink alcohol, or use illegal drugs. Some items may interact with your medicine. What should I watch for while using this medication? Visit your care team for regular checks on your progress. Tell your care team if your symptoms do not start to get better or if they get  worse. You may need blood work done while you are taking this medication. Your care  team will monitor your HbA1C (A1C). This test shows what your average blood sugar (glucose) level was over the past 2 to 3 months. Know the symptoms of low blood sugar and know how to treat it. Always carry a source of quick sugar with you. Examples include hard sugar candy or glucose tablets. Make sure others know that you can choke if you eat or drink if your blood sugar is too low and you are unable to care for yourself. Get medical help at once. Tell your care team if you have high blood sugar. Your medication dose may change if your body is under stress. Some types of stress that may affect your blood sugar include fever, infection, and surgery. Do not share pens or cartridges with anyone, even if the needle is changed. Each pen should only be used by one person. Sharing could cause an infection. Wear a medical ID bracelet or chain. Carry a card that describes your condition. List the medications and doses you take on the card. Talk to your care team about your risk of cancer. You may be more at risk for certain types of cancer if you take this medication. Talk to your care team right away if you have a lump or swelling in your neck, hoarseness that does not go away, trouble swallowing, shortness of breath, or trouble breathing. Make sure you stay hydrated while taking this medication. Drink water often. Eat fruits and veggies that have a high water content. Drink more water when it is hot or you are active. Talk to your care team right away if you have fever, infection, vomiting, diarrhea, or if you sweat a lot while taking this medication. The loss of too much body fluid may make it dangerous for you to take this medication. If you are going to need surgery or a procedure, tell your care team that you are taking this medication. What side effects may I notice from receiving this medication? Side effects that you should report to your care team as soon as possible: Allergic reactions--skin rash,  itching, hives, swelling of the face, lips, tongue, or throat Change in vision Dehydration--increased thirst, dry mouth, feeling faint or lightheaded, headache, dark yellow or brown urine Gallbladder problems--severe stomach pain, nausea, vomiting, fever Heart palpitations--rapid, pounding, or irregular heartbeat Kidney injury--decrease in the amount of urine, swelling of the ankles, hands, or feet Pancreatitis--severe stomach pain that spreads to your back or gets worse after eating or when touched, fever, nausea, vomiting Thoughts of suicide or self-harm, worsening mood, feelings of depression Thyroid  cancer--new mass or lump in the neck, pain or trouble swallowing, trouble breathing, hoarseness Side effects that usually do not require medical attention (report these to your care team if they continue or are bothersome): Diarrhea Loss of appetite Nausea Upset stomach This list may not describe all possible side effects. Call your doctor for medical advice about side effects. You may report side effects to FDA at 1-800-FDA-1088. Where should I keep my medication? Keep out of the reach of children. Store unopened pens in a refrigerator between 2 and 8 degrees C (36 and 46 degrees F). Do not freeze. Protect from light and heat. After you first use the pen, it can be stored for 56 days at room temperature between 15 and 30 degrees C (59 and 86 degrees F) or in a refrigerator. Throw away your used pen after 56 days  or after the expiration date, whichever comes first. Do not store your pen with the needle attached. If the needle is left on, medication may leak from the pen. NOTE: This sheet is a summary. It may not cover all possible information. If you have questions about this medicine, talk to your doctor, pharmacist, or health care provider.  2024 [employer] Standard (2023-03-26 00:00:00)

## 2023-08-25 ENCOUNTER — Ambulatory Visit (INDEPENDENT_AMBULATORY_CARE_PROVIDER_SITE_OTHER): Admitting: Family Medicine

## 2023-08-25 ENCOUNTER — Encounter: Payer: Self-pay | Admitting: Family Medicine

## 2023-08-25 VITALS — BP 138/66 | HR 65 | Temp 98.9°F | Ht 66.0 in | Wt 197.8 lb

## 2023-08-25 DIAGNOSIS — M79674 Pain in right toe(s): Secondary | ICD-10-CM | POA: Diagnosis not present

## 2023-08-25 LAB — LIPID PANEL
Cholesterol: 207 mg/dL — ABNORMAL HIGH (ref <200)
HDL: 43 mg/dL (ref >=40)
LDL Cholesterol (Calc): 142 mg/dL — ABNORMAL HIGH
Non-HDL Cholesterol (Calc): 164 mg/dL — ABNORMAL HIGH (ref <130)
Total CHOL/HDL Ratio: 4.8 (calc) (ref <5.0)
Triglycerides: 105 mg/dL (ref <150)

## 2023-08-25 LAB — HEMOGLOBIN A1C
Hgb A1c MFr Bld: 8.6 % — ABNORMAL HIGH (ref <5.7)
Mean Plasma Glucose: 200 mg/dL
eAG (mmol/L): 11.1 mmol/L

## 2023-08-25 LAB — MICROALBUMIN / CREATININE URINE RATIO
Creatinine, Urine: 92 mg/dL (ref 20–320)
Microalb Creat Ratio: 72 mg/g{creat} — ABNORMAL HIGH (ref <30)
Microalb, Ur: 6.6 mg/dL

## 2023-08-25 LAB — BASIC METABOLIC PANEL WITH GFR
BUN/Creatinine Ratio: 19 (calc) (ref 6–22)
BUN: 25 mg/dL (ref 7–25)
CO2: 29 mmol/L (ref 20–32)
Calcium: 8.9 mg/dL (ref 8.6–10.3)
Chloride: 103 mmol/L (ref 98–110)
Creat: 1.35 mg/dL — ABNORMAL HIGH (ref 0.70–1.28)
Glucose, Bld: 150 mg/dL — ABNORMAL HIGH (ref 65–99)
Potassium: 5 mmol/L (ref 3.5–5.3)
Sodium: 138 mmol/L (ref 135–146)
eGFR: 55 mL/min/{1.73_m2} — ABNORMAL LOW (ref >=60)

## 2023-08-25 LAB — C-PEPTIDE: C-Peptide: 0.64 ng/mL — ABNORMAL LOW (ref 0.80–3.85)

## 2023-08-25 LAB — ZNT8 ANTIBODIES: ZNT8 Antibodies: 321 U/mL — ABNORMAL HIGH (ref <15)

## 2023-08-25 LAB — GLUTAMIC ACID DECARBOXYLASE AUTO ABS: Glutamic Acid Decarb Ab: >250 [IU]/mL — ABNORMAL HIGH (ref <5)

## 2023-08-25 NOTE — Patient Instructions (Addendum)
 Thank you for coming in today.  I am suspicious more for an ingrown toenail than an actual infection at this time.  Will try to have you seen by podiatry in the next day or 2 if possible.  Okay to do warm water soak, gentle pressure to the area and if you notice any pus/discharge, let me know.  If any acute worsening be seen.  Ingrown Toenail  An ingrown toenail occurs when the corner or sides of a toenail grow into the surrounding skin. This causes discomfort and pain. The big toe is most commonly affected, but any of the toes can be affected. If an ingrown toenail is not treated, it can become infected. What are the causes? This condition may be caused by: Wearing shoes that are too small or tight. An injury, such as stubbing your toe or having your toe stepped on. Improper cutting or care of your toenails. Having nail or foot abnormalities that were present from birth (congenital abnormalities), such as having a nail that is too big for your toe. What increases the risk? The following factors may make you more likely to develop ingrown toenails: Age. Nails tend to get thicker with age, so ingrown nails are more common among older people. Cutting your toenails incorrectly, such as cutting them very short or cutting them unevenly. An ingrown toenail is more likely to get infected if you have: Diabetes. Blood flow (circulation) problems. What are the signs or symptoms? Symptoms of an ingrown toenail may include: Pain, soreness, or tenderness. Redness. Swelling. Hardening of the skin that surrounds the toenail. Signs that an ingrown toenail may be infected include: Fluid or pus. Symptoms that get worse. How is this diagnosed? Ingrown toenails may be diagnosed based on: Your symptoms and medical history. A physical exam. Labs or tests. If you have fluid or blood coming from your toenail, a sample may be collected to test for the specific type of bacteria that is causing the  infection. How is this treated? Treatment depends on the severity of your symptoms. You may be able to care for your toenail at home. If you have an infection, you may be prescribed antibiotic medicines. If you have fluid or pus draining from your toenail, your health care provider may drain it. If you have trouble walking, you may be given crutches to use. If you have a severe or infected ingrown toenail, you may need a procedure to remove part or all of the nail. Follow these instructions at home: Foot care  Check your wound every day for signs of infection, or as often as told by your health care provider. Check for: More redness, swelling, or pain. More fluid or blood. Warmth. Pus or a bad smell. Do not pick at your toenail or try to remove it yourself. Soak your foot in warm, soapy water. Do this for 20 minutes, 3 times a day, or as often as told by your health care provider. This helps to keep your toe clean and your skin soft. Wear shoes that fit well and are not too tight. Your health care provider may recommend that you wear open-toed shoes while you heal. Trim your toenails regularly and carefully. Cut your toenails straight across to prevent injury to the skin at the corners of the toenail. Do not cut your nails in a curved shape. Keep your feet clean and dry to help prevent infection. General instructions Take over-the-counter and prescription medicines only as told by your health care provider. If you  were prescribed an antibiotic, take it as told by your health care provider. Do not stop taking the antibiotic even if you start to feel better. If your health care provider told you to use crutches to help you move around, use them as instructed. Return to your normal activities as told by your health care provider. Ask your health care provider what activities are safe for you. Keep all follow-up visits. This is important. Contact a health care provider if: You have more  redness, swelling, pain, or other symptoms that do not improve with treatment. You have fluid, blood, or pus coming from your toenail. You have a red streak on your skin that starts at your foot and spreads up your leg. You have a fever. Summary An ingrown toenail occurs when the corner or sides of a toenail grow into the surrounding skin. This causes discomfort and pain. The big toe is most commonly affected, but any of the toes can be affected. If an ingrown toenail is not treated, it can become infected. Fluid or pus draining from your toenail is a sign of infection. Your health care provider may need to drain it. You may be given antibiotics to treat the infection. Trimming your toenails regularly and properly can help you prevent an ingrown toenail. This information is not intended to replace advice given to you by your health care provider. Make sure you discuss any questions you have with your health care provider. Document Revised: 08/13/2020 Document Reviewed: 08/13/2020 Elsevier Patient Education  2024 [employer].

## 2023-08-25 NOTE — Progress Notes (Signed)
 Subjective:  Patient ID: Matthew [name], male    DOB: 11/29/1945  Age: 78 y.o. MRN: [id number]  CC:  Chief Complaint  Patient presents with   Toe Pain    Pt notes toe pain in Rt great toe, started yesterday     HPI Matthew [name] presents for   R great toe pain: Sore past few weeks. Worse yesterday - more pain/sensitivity.  No discharge/spreading redness.  Ingrown toenail as a teenager, none recent.  Trims own toenails, no injury.  Tx: hydrogen peroxide.    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   Severe aortic stenosis 04/13/2022   Aortic stenosis 03/27/2022   Nonrheumatic aortic valve stenosis 03/13/2022   Mitral stenosis 03/13/2022   Myopathy, unspecified 07/09/2020   Statin myopathy 09/15/2019   Atrial flutter (HCC) 10/31/2014   Coronary artery disease    Paroxysmal atrial flutter (HCC)    Diabetes mellitus without complication (HCC)    Valvular heart disease    Atrial fibrillation (HCC) 01/25/2014   Diabetes mellitus, type 2 (HCC) 01/16/2014   Hyperlipidemia 01/16/2014   Erectile dysfunction associated with type 2 diabetes mellitus (HCC) 01/16/2014   Coronary atherosclerosis of native coronary artery 06/30/2013   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 05/2009 CABG x 3:  LIMA->LAD, VG->OM, VG->PDA; b. Nuc 01/2014: inf-lat scar but no ischemia, EF 57%.   Diabetic retinopathy (HCC)    mild- Dr. Lasandra Points   Glaucoma    Hyperlipidemia    a. Intolerant of lipitor and vytorin.   Lyme disease    Migraine    "once or twice" (11/20/2015)   Mitral stenosis    Mild to moderate with mean mitral valve gradient 4 mmHg by echo 06/2023   Myocardial infarction [facility])    'saw evidence of it on an EKG done in 2005"   Paroxysmal atrial flutter (HCC)    a. 01/2014 s/p DCCV;  b. CHA2DS2VASc = 3-->Eliquis .   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 Abel Hoe and Dr. Sherene Dilling   Sacral pain    "right"   Sciatica of right side    Testicular cancer (HCC) 1982   "heavy doses of radiation; it was stage 2"   Type II diabetes mellitus (HCC)    Valvular heart disease    a. 01/2014 Echo: Ef 60-65%, no rwma, mild AI/MS, mod MR, mildly dil LA.   Past Surgical History:  Procedure Laterality Date   ABDOMINAL EXPLORATION SURGERY  ~ 2005   "for hernia, but didn't have one"   ATRIAL FIBRILLATION ABLATION N/A 09/17/2016   Procedure: Atrial Fibrillation Ablation;  Surgeon: Lei Pump, MD;  [facility] INVASIVE CV LAB;  Service: Cardiovascular;  Laterality: N/A;   CARDIAC CATHETERIZATION  2011   CARDIOVERSION N/A 02/19/2014   Procedure: CARDIOVERSION;  Surgeon: Mickiel Albany, MD;  Location: [facility] ENDOSCOPY;  Service: Cardiovascular;  Laterality: N/A;   CARDIOVERSION N/A 10/28/2015   Procedure: CARDIOVERSION;  Surgeon: Lake Pilgrim, MD;  Location: [facility] ENDOSCOPY;  Service: Cardiovascular;  Laterality: N/A;   CARDIOVERSION  11/20/2015   "200 joules"   CARDIOVERSION N/A 07/02/2022   Procedure: CARDIOVERSION;  Surgeon: Sheryle Donning, MD;  Location: [facility] ENDOSCOPY;  Service: Cardiovascular;  Laterality: N/A;   CARDIOVERSION N/A 11/10/2022   Procedure: CARDIOVERSION;  Surgeon: Luana Rumple, MD;  Location: [facility]  INVASIVE CV LAB;  Service: Cardiovascular;  Laterality: N/A;   CORONARY ARTERY BYPASS GRAFT  05/2009   LIMA-LAD, SVG-OM, SVG-PDA 06/20/09   ELBOW FRACTURE SURGERY Left    broken ulna on left elbow-surgical repair   ELECTROPHYSIOLOGIC STUDY N/A 10/31/2014   Procedure: A-Flutter;  Surgeon: Tammie Fall, MD;  Location: [facility] INVASIVE CV LAB;  Service: Cardiovascular;  Laterality: N/A;   ELECTROPHYSIOLOGIC STUDY N/A 11/20/2015   Procedure: A-Flutter Ablation;  Surgeon: Tammie Fall, MD;  Location: [facility] INVASIVE CV LAB;  Service: Cardiovascular;  Laterality: N/A;   EYE SURGERY     FRACTURE SURGERY     INTRAOPERATIVE TRANSTHORACIC ECHOCARDIOGRAM N/A 06/02/2022   Procedure: INTRAOPERATIVE TRANSTHORACIC ECHOCARDIOGRAM;  Surgeon: Odie Benne, MD;  Location: [facility] OR;  Service: Open Heart Surgery;  Laterality: N/A;   PACEMAKER IMPLANT N/A 06/03/2022   Procedure: PACEMAKER IMPLANT;  Surgeon: Lei Pump, MD;  Location: [facility] INVASIVE CV LAB;  Service: Cardiovascular;  Laterality: N/A;   RIGHT/LEFT HEART CATH AND CORONARY/GRAFT ANGIOGRAPHY N/A 04/13/2022   Procedure: RIGHT/LEFT HEART CATH AND CORONARY/GRAFT ANGIOGRAPHY;  Surgeon: Odie Benne, MD;  Location: [facility] INVASIVE CV LAB;  Service: Cardiovascular;  Laterality: N/A;   TEE WITHOUT CARDIOVERSION N/A 03/13/2022   Procedure: TRANSESOPHAGEAL ECHOCARDIOGRAM (TEE);  Surgeon: Luana Rumple, MD;  Location: [facility] ENDOSCOPY;  Service: Cardiovascular;  Laterality: N/A;   TESTICLE REMOVAL Right 1982   TONSILLECTOMY  ~ 1967   TRANSCATHETER AORTIC VALVE REPLACEMENT, TRANSFEMORAL N/A 06/02/2022   Procedure: Transcatheter Aortic Valve Replacement, Transfemoral;  Surgeon: Odie Benne, MD;  Location: [facility] OR;  Service: Open Heart Surgery;  Laterality: N/A;   Allergies  Allergen Reactions   Bee Venom Swelling and Other (See Comments)    honey bee venom   Codeine Hives, Nausea Only and Rash    codeine   Pioglitazone Swelling and Other (See  Comments)    pioglitazone   Metformin Nausea Only   Statins Other (See Comments)    Muscle pain, fatigue   Raspberry Itching and Hives   Prior to Admission medications   Medication Sig Start Date End Date Taking? Authorizing Provider  apixaban  (ELIQUIS ) 5 MG TABS tablet Take 1 tablet (5 mg total) by mouth 2 (two) times daily. 07/19/23  Yes Ardia Kraft, PA-C  BD VEO INSULIN  SYRINGE U/F 31G X 15/64" 0.5 ML MISC USE TO INJECT 4 TIMES A DAY 11/03/22  Yes Lajean Pike, MD  Continuous Blood Gluc Sensor (DEXCOM G7 SENSOR) MISC by Does not apply route.   Yes [provider]  cyanocobalamin  (VITAMIN B12) 1000 MCG tablet Take 1,000 mcg by mouth daily.   Yes [provider]  dorzolamide -timolol  (COSOPT ) 22.3-6.8 MG/ML ophthalmic solution Place 1 drop into the left eye 2 (two) times daily. 10/01/21  Yes [provider]  furosemide  (LASIX ) 40 MG tablet Take 40 mg by mouth as needed for fluid or edema.  Yes [provider]  insulin  NPH Human (NOVOLIN N) 100 UNIT/ML injection Inject 0.2-0.25 mLs (20-25 Units total) into the skin at bedtime. Patient taking differently: Inject 20-25 Units into the skin at bedtime. Patient taking differently states takes morning and at supper 05/03/23  Yes Thapa, [place], MD  lisinopril  (ZESTRIL ) 5 MG tablet TAKE 1 TABLET BY MOUTH EVERY DAY 09/16/22  Yes Camnitz, Will Gaylyn Keas, MD  NOVOLOG  100 UNIT/ML injection INJECT 15-20 UNITS INTO THE SKIN 3 (THREE) TIMES DAILY WITH MEALS. SLIDING SCALE 11/17/22  Yes Lajean Pike, MD  potassium chloride  (KLOR-CON ) 10 MEQ tablet TAKE 1 TABLET BY MOUTH EVERY DAY Patient taking differently: Take 10 mEq by mouth daily. As needed with Furosemide  04/23/23  Yes Benjiman Bras, MD  Semaglutide ,0.25 or 0.5MG /DOS, 2 MG/3ML SOPN Inject 0.25 mg into the skin once a week for 4 weeks and increase to 0.5 mg weekly. 08/19/23  Yes Thapa, [place], MD   Social History   Socioeconomic History   Marital status: Married     Spouse name: Not on file   Number of children: 0   Years of education: Not on file   Highest education level: Bachelor's degree (e.g., BA, AB, BS)  Occupational History   Occupation: Insurance account manager  Tobacco Use   Smoking status: Former    Current packs/day: 0.00    Average packs/day: 0.5 packs/day for 15.0 years (7.5 ttl pk-yrs)    Types: Cigarettes    Start date: 14    Quit date: 1990    Years since quitting: 35.3   Smokeless tobacco: Never   Tobacco comments:    "quit smoking cigarettes in the 1980's; don't know how much or for how long"  Vaping Use   Vaping status: Never Used  Substance and Sexual Activity   Alcohol use: Yes    Alcohol/week: 3.0 standard drinks of alcohol    Types: 3 Cans of beer per week   Drug use: No    Comment: "quit smoking pot in the 1990s"   Sexual activity: Not Currently    Birth control/protection: Coitus interruptus  Other Topics Concern   Not on file  Social History Narrative   Not on file   Social Drivers of Health   Financial Resource Strain: Patient Declined (06/30/2023)   Overall Financial Resource Strain (CARDIA)    Difficulty of Paying Living Expenses: Patient declined  Food Insecurity: Patient Declined (06/30/2023)   Hunger Vital Sign    Worried About Running Out of Food in the Last Year: Patient declined    Ran Out of Food in the Last Year: Patient declined  Transportation Needs: No Transportation Needs (06/30/2023)   PRAPARE - Administrator, Civil Service (Medical): No    Lack of Transportation (Non-Medical): No  Physical Activity: Unknown (06/30/2023)   Exercise Vital Sign    Days of Exercise per Week: 0 days    Minutes of Exercise per Session: Not on file  Stress: No Stress Concern Present (06/30/2023)   [employer] of Occupational Health - Occupational Stress Questionnaire    Feeling of Stress : Not at all  Social Connections: Unknown (06/30/2023)   Social Connection and Isolation Panel [NHANES]    Frequency of  Communication with Friends and Family: More than three times a week    Frequency of Social Gatherings with Friends and Family: Three times a week    Attends Religious Services: Patient declined    Active Member of Clubs or Organizations: No    Attends  Club or Organization Meetings: Not on file    Marital Status: Married  Intimate Partner Violence: Not At Risk (03/28/2022)   Humiliation, Afraid, Rape, and Kick questionnaire    Fear of Current or Ex-Partner: No    Emotionally Abused: No    Physically Abused: No    Sexually Abused: No    Review of Systems Per HPI  Objective:   Vitals:   08/25/23 1351  BP: 138/66  Pulse: 65  Temp: 98.9 F (37.2 C)  TempSrc: Temporal  SpO2: 98%  Weight: 197 lb 12.8 oz (89.7 kg)  Height: 5\' 6"  (1.676 m)     Physical Exam Constitutional:      General: He is not in acute distress.    Appearance: Normal appearance. He is well-developed.  HENT:     Head: Normocephalic and atraumatic.  Cardiovascular:     Rate and Rhythm: Normal rate.  Pulmonary:     Effort: Pulmonary effort is normal.  Musculoskeletal:     Comments: See photo, right great toe, slight discomfort at the medial nail fold with slight edema, slight swelling without significant induration or fluctuance, no discharge or bleeding with pressure of nail or sore area.  Remainder of toe nontender including IP, MTP, or other soft tissue of toe.  Neurological:     Mental Status: He is alert and oriented to person, place, and time.  Psychiatric:        Mood and Affect: Mood normal.         Assessment & Plan:  [name] is a 78 y.o. male . Pain of right great toe - Plan: Ambulatory referral to Podiatry Suspected ingrown toenail, does not appear infected at this time.  He is diabetic, so we will have him see podiatry fairly soon, warm soaks for now if needed, monitor for any worsening including any discharge and to let me know right away if that were to occur but again does  not appear infected at this time.  Podiatry referral ordered, RTC/ER precautions/urgent care precautions given.  No orders of the defined types were placed in this encounter.  Patient Instructions  Thank you for coming in today.  I am suspicious more for an ingrown toenail than an actual infection at this time.  Will try to have you seen by podiatry in the next day or 2 if possible.  Okay to do warm water soak, gentle pressure to the area and if you notice any pus/discharge, let me know.  If any acute worsening be seen.  Ingrown Toenail  An ingrown toenail occurs when the corner or sides of a toenail grow into the surrounding skin. This causes discomfort and pain. The big toe is most commonly affected, but any of the toes can be affected. If an ingrown toenail is not treated, it can become infected. What are the causes? This condition may be caused by: Wearing shoes that are too small or tight. An injury, such as stubbing your toe or having your toe stepped on. Improper cutting or care of your toenails. Having nail or foot abnormalities that were present from birth (congenital abnormalities), such as having a nail that is too big for your toe. What increases the risk? The following factors may make you more likely to develop ingrown toenails: Age. Nails tend to get thicker with age, so ingrown nails are more common among older people. Cutting your toenails incorrectly, such as cutting them very short or cutting them unevenly. An ingrown toenail is more  likely to get infected if you have: Diabetes. Blood flow (circulation) problems. What are the signs or symptoms? Symptoms of an ingrown toenail may include: Pain, soreness, or tenderness. Redness. Swelling. Hardening of the skin that surrounds the toenail. Signs that an ingrown toenail may be infected include: Fluid or pus. Symptoms that get worse. How is this diagnosed? Ingrown toenails may be diagnosed based on: Your symptoms and  medical history. A physical exam. Labs or tests. If you have fluid or blood coming from your toenail, a sample may be collected to test for the specific type of bacteria that is causing the infection. How is this treated? Treatment depends on the severity of your symptoms. You may be able to care for your toenail at home. If you have an infection, you may be prescribed antibiotic medicines. If you have fluid or pus draining from your toenail, your health care provider may drain it. If you have trouble walking, you may be given crutches to use. If you have a severe or infected ingrown toenail, you may need a procedure to remove part or all of the nail. Follow these instructions at home: Foot care  Check your wound every day for signs of infection, or as often as told by your health care provider. Check for: More redness, swelling, or pain. More fluid or blood. Warmth. Pus or a bad smell. Do not pick at your toenail or try to remove it yourself. Soak your foot in warm, soapy water. Do this for 20 minutes, 3 times a day, or as often as told by your health care provider. This helps to keep your toe clean and your skin soft. Wear shoes that fit well and are not too tight. Your health care provider may recommend that you wear open-toed shoes while you heal. Trim your toenails regularly and carefully. Cut your toenails straight across to prevent injury to the skin at the corners of the toenail. Do not cut your nails in a curved shape. Keep your feet clean and dry to help prevent infection. General instructions Take over-the-counter and prescription medicines only as told by your health care provider. If you were prescribed an antibiotic, take it as told by your health care provider. Do not stop taking the antibiotic even if you start to feel better. If your health care provider told you to use crutches to help you move around, use them as instructed. Return to your normal activities as told by  your health care provider. Ask your health care provider what activities are safe for you. Keep all follow-up visits. This is important. Contact a health care provider if: You have more redness, swelling, pain, or other symptoms that do not improve with treatment. You have fluid, blood, or pus coming from your toenail. You have a red streak on your skin that starts at your foot and spreads up your leg. You have a fever. Summary An ingrown toenail occurs when the corner or sides of a toenail grow into the surrounding skin. This causes discomfort and pain. The big toe is most commonly affected, but any of the toes can be affected. If an ingrown toenail is not treated, it can become infected. Fluid or pus draining from your toenail is a sign of infection. Your health care provider may need to drain it. You may be given antibiotics to treat the infection. Trimming your toenails regularly and properly can help you prevent an ingrown toenail. This information is not intended to replace advice given to you by  your health care provider. Make sure you discuss any questions you have with your health care provider. Document Revised: 08/13/2020 Document Reviewed: 08/13/2020 Elsevier Patient Education  2024 Elsevier Inc.    Signed,   Caro Christmas, MD [facility], [facility] 08/25/23 2:10 PM

## 2023-08-26 ENCOUNTER — Telehealth: Payer: Self-pay

## 2023-08-26 ENCOUNTER — Encounter: Payer: Self-pay | Admitting: Endocrinology

## 2023-08-26 NOTE — Telephone Encounter (Signed)
-----   Message from [place] Thapa sent at 08/26/2023  8:11 AM EDT ----- Please notify patient of labs reviewed test results are significantly elevated ZnT8 and GAD65 antibodies, positive for type 1 diabetes mellitus.  He has already been on multidose insulin  regimen.  No change in plan of medications.  He actually has type 1 diabetes mellitus.

## 2023-08-26 NOTE — Telephone Encounter (Signed)
 Patient given results and medication changes as directed by MD. No further questions at this time.

## 2023-08-28 ENCOUNTER — Other Ambulatory Visit: Payer: Self-pay | Admitting: Endocrinology

## 2023-08-28 DIAGNOSIS — E1165 Type 2 diabetes mellitus with hyperglycemia: Secondary | ICD-10-CM

## 2023-08-30 ENCOUNTER — Encounter: Payer: Self-pay | Admitting: Ophthalmology

## 2023-08-30 DIAGNOSIS — Z961 Presence of intraocular lens: Secondary | ICD-10-CM | POA: Diagnosis not present

## 2023-08-30 DIAGNOSIS — E113513 Type 2 diabetes mellitus with proliferative diabetic retinopathy with macular edema, bilateral: Secondary | ICD-10-CM | POA: Diagnosis not present

## 2023-08-30 DIAGNOSIS — H401131 Primary open-angle glaucoma, bilateral, mild stage: Secondary | ICD-10-CM | POA: Diagnosis not present

## 2023-08-30 DIAGNOSIS — Z794 Long term (current) use of insulin: Secondary | ICD-10-CM | POA: Diagnosis not present

## 2023-08-30 DIAGNOSIS — H35342 Macular cyst, hole, or pseudohole, left eye: Secondary | ICD-10-CM | POA: Diagnosis not present

## 2023-08-30 DIAGNOSIS — H3412 Central retinal artery occlusion, left eye: Secondary | ICD-10-CM | POA: Diagnosis not present

## 2023-08-30 DIAGNOSIS — H211X2 Other vascular disorders of iris and ciliary body, left eye: Secondary | ICD-10-CM | POA: Diagnosis not present

## 2023-08-30 DIAGNOSIS — H35352 Cystoid macular degeneration, left eye: Secondary | ICD-10-CM | POA: Diagnosis not present

## 2023-08-30 DIAGNOSIS — H35372 Puckering of macula, left eye: Secondary | ICD-10-CM | POA: Diagnosis not present

## 2023-09-10 ENCOUNTER — Ambulatory Visit (INDEPENDENT_AMBULATORY_CARE_PROVIDER_SITE_OTHER): Payer: Medicare HMO

## 2023-09-10 ENCOUNTER — Encounter: Payer: Self-pay | Admitting: Family Medicine

## 2023-09-10 DIAGNOSIS — I442 Atrioventricular block, complete: Secondary | ICD-10-CM | POA: Diagnosis not present

## 2023-09-10 LAB — CUP PACEART REMOTE DEVICE CHECK
Battery Remaining Longevity: 112 mo
Battery Remaining Percentage: 93 %
Battery Voltage: 3.01 V
Brady Statistic AP VP Percent: 1 %
Brady Statistic AP VS Percent: 15 %
Brady Statistic AS VP Percent: 1 %
Brady Statistic AS VS Percent: 84 %
Brady Statistic RA Percent Paced: 13 %
Brady Statistic RV Percent Paced: 1.8 %
Date Time Interrogation Session: 20250516023723
Implantable Lead Connection Status: 753985
Implantable Lead Connection Status: 753985
Implantable Lead Implant Date: 20240207
Implantable Lead Implant Date: 20240207
Implantable Lead Location: 753859
Implantable Lead Location: 753860
Implantable Pulse Generator Implant Date: 20240207
Lead Channel Impedance Value: 410 Ohm
Lead Channel Impedance Value: 580 Ohm
Lead Channel Pacing Threshold Amplitude: 0.75 V
Lead Channel Pacing Threshold Amplitude: 0.75 V
Lead Channel Pacing Threshold Pulse Width: 0.5 ms
Lead Channel Pacing Threshold Pulse Width: 0.5 ms
Lead Channel Sensing Intrinsic Amplitude: 12 mV
Lead Channel Sensing Intrinsic Amplitude: 2.5 mV
Lead Channel Setting Pacing Amplitude: 2 V
Lead Channel Setting Pacing Amplitude: 2.5 V
Lead Channel Setting Pacing Pulse Width: 0.5 ms
Lead Channel Setting Sensing Sensitivity: 2 mV
Pulse Gen Model: 2272
Pulse Gen Serial Number: 8154174

## 2023-09-12 ENCOUNTER — Ambulatory Visit: Payer: Self-pay | Admitting: Cardiology

## 2023-09-15 ENCOUNTER — Telehealth: Payer: Self-pay | Admitting: Cardiology

## 2023-09-15 NOTE — Telephone Encounter (Signed)
  Pt c/o medication issue:  1. Name of Medication: metoprolol  100 mg  2. How are you currently taking this medication (dosage and times per day)? 1 tablet a day  3. Are you having a reaction (difficulty breathing--STAT)? No   4. What is your medication issue? Pt said, he need refill for this medication and he only have 2 pills left. Metoprolol  100 mg is not on his medication list

## 2023-09-15 NOTE — Telephone Encounter (Signed)
 Left message to call back to clarify dosing & pharmacy

## 2023-09-19 ENCOUNTER — Telehealth: Payer: Self-pay | Admitting: Home Health

## 2023-09-19 ENCOUNTER — Other Ambulatory Visit: Payer: Self-pay | Admitting: Home Health

## 2023-09-19 MED ORDER — METOPROLOL SUCCINATE ER 100 MG PO TB24
100.0000 mg | ORAL_TABLET | Freq: Every day | ORAL | 0 refills | Status: DC
Start: 2023-09-19 — End: 2023-12-03

## 2023-09-19 NOTE — Telephone Encounter (Signed)
 Patient called after hour line, reports his metoprolol  has ran out and he has been taking metoprolol  for many years for CAD and A fib. He reports taking Metoprolol  XL 100mg  daily. This medication was not found on his med list, unclear why. 90 days refill provided to [facility]. He has upcoming appt with EP in [place] 2025, further refill can be provided then.

## 2023-10-08 DIAGNOSIS — E1165 Type 2 diabetes mellitus with hyperglycemia: Secondary | ICD-10-CM | POA: Diagnosis not present

## 2023-10-11 DIAGNOSIS — E113512 Type 2 diabetes mellitus with proliferative diabetic retinopathy with macular edema, left eye: Secondary | ICD-10-CM | POA: Diagnosis not present

## 2023-10-11 DIAGNOSIS — Z794 Long term (current) use of insulin: Secondary | ICD-10-CM | POA: Diagnosis not present

## 2023-10-11 DIAGNOSIS — H35342 Macular cyst, hole, or pseudohole, left eye: Secondary | ICD-10-CM | POA: Diagnosis not present

## 2023-10-11 DIAGNOSIS — H211X2 Other vascular disorders of iris and ciliary body, left eye: Secondary | ICD-10-CM | POA: Diagnosis not present

## 2023-10-11 DIAGNOSIS — H3412 Central retinal artery occlusion, left eye: Secondary | ICD-10-CM | POA: Diagnosis not present

## 2023-10-11 DIAGNOSIS — H401131 Primary open-angle glaucoma, bilateral, mild stage: Secondary | ICD-10-CM | POA: Diagnosis not present

## 2023-10-11 DIAGNOSIS — H35372 Puckering of macula, left eye: Secondary | ICD-10-CM | POA: Diagnosis not present

## 2023-10-11 DIAGNOSIS — E113511 Type 2 diabetes mellitus with proliferative diabetic retinopathy with macular edema, right eye: Secondary | ICD-10-CM | POA: Diagnosis not present

## 2023-10-11 DIAGNOSIS — H35352 Cystoid macular degeneration, left eye: Secondary | ICD-10-CM | POA: Diagnosis not present

## 2023-10-12 ENCOUNTER — Ambulatory Visit: Payer: Self-pay | Admitting: Family Medicine

## 2023-10-14 NOTE — Progress Notes (Signed)
 Remote pacemaker transmission.

## 2023-11-22 DIAGNOSIS — H35352 Cystoid macular degeneration, left eye: Secondary | ICD-10-CM | POA: Diagnosis not present

## 2023-11-22 DIAGNOSIS — H211X2 Other vascular disorders of iris and ciliary body, left eye: Secondary | ICD-10-CM | POA: Diagnosis not present

## 2023-11-22 DIAGNOSIS — H35372 Puckering of macula, left eye: Secondary | ICD-10-CM | POA: Diagnosis not present

## 2023-11-22 DIAGNOSIS — H401131 Primary open-angle glaucoma, bilateral, mild stage: Secondary | ICD-10-CM | POA: Diagnosis not present

## 2023-11-22 DIAGNOSIS — H35342 Macular cyst, hole, or pseudohole, left eye: Secondary | ICD-10-CM | POA: Diagnosis not present

## 2023-11-22 DIAGNOSIS — H3412 Central retinal artery occlusion, left eye: Secondary | ICD-10-CM | POA: Diagnosis not present

## 2023-11-22 DIAGNOSIS — E113512 Type 2 diabetes mellitus with proliferative diabetic retinopathy with macular edema, left eye: Secondary | ICD-10-CM | POA: Diagnosis not present

## 2023-11-24 ENCOUNTER — Other Ambulatory Visit: Payer: Self-pay | Admitting: Cardiology

## 2023-12-03 ENCOUNTER — Other Ambulatory Visit: Payer: Self-pay | Admitting: Home Health

## 2023-12-08 ENCOUNTER — Ambulatory Visit: Admitting: Endocrinology

## 2023-12-08 ENCOUNTER — Encounter: Payer: Self-pay | Admitting: Endocrinology

## 2023-12-08 ENCOUNTER — Ambulatory Visit: Payer: Self-pay | Admitting: Endocrinology

## 2023-12-08 VITALS — BP 128/80 | HR 69 | Resp 20 | Ht 66.0 in | Wt 196.2 lb

## 2023-12-08 DIAGNOSIS — E1065 Type 1 diabetes mellitus with hyperglycemia: Secondary | ICD-10-CM

## 2023-12-08 DIAGNOSIS — E1069 Type 1 diabetes mellitus with other specified complication: Secondary | ICD-10-CM

## 2023-12-08 LAB — POCT GLYCOSYLATED HEMOGLOBIN (HGB A1C): Hemoglobin A1C: 7.6 % — AB (ref 4.0–5.6)

## 2023-12-08 MED ORDER — INSULIN NPH (HUMAN) (ISOPHANE) 100 UNIT/ML ~~LOC~~ SUSP: 15.0000 [IU] | Freq: Two times a day (BID) | SUBCUTANEOUS | 3 refills | Status: DC

## 2023-12-08 MED ORDER — INSULIN ASPART 100 UNIT/ML IJ SOLN: 5.0000 [IU] | Freq: Three times a day (TID) | INTRAMUSCULAR | 3 refills | Status: AC

## 2023-12-08 NOTE — Progress Notes (Signed)
 Outpatient Endocrinology Note Matthew Damonica Chopra, MD  12/08/23  Patient's Name: Matthew [name]    DOB: [birth year]    MRN: [id number]                                                    REASON OF VISIT: Follow up for type 1 diabetes mellitus  PCP: Matthew Reyes SAUNDERS, MD  HISTORY OF PRESENT ILLNESS:   Matthew [name] is a 78 y.o. old male with past medical history listed below, is here for follow up of type 1 diabetes mellitus.   Pertinent Diabetes History: Patient was diagnosed with diabetes mellitus in 1992, managed as type 2 diabetes mellitus as of April 2025.  He was initially treated with Glucophage.  He was treated with premixed insulin  for several years 15+ years.  In April 2025 he had significantly elevated GAD 65 and ZnT8 Ab autoantibodies, consistent with type 1 diabetes mellitus.   Latest Reference Range & Units 08/17/23 09:49  ZNT8 Antibodies <15 U/mL 321 (H)  Glutamic Acid Decarb Ab <5 IU/mL >250 (H)  C-Peptide 0.80 - 3.85 ng/mL 0.64 (L)  (H): Data is abnormally high (L): Data is abnormally low  He has type 1 diabetes mellitus.  Chronic Diabetes Complications : Retinopathy: yes, following with ophthalmology , reportedly. Retinal detachment, following with Dr. Loretha Nephropathy: CKD, on lisinopril , history of microalbuminuria. Peripheral neuropathy: no Coronary artery disease: yes, s/p CABG Stroke: no  Relevant comorbidities and cardiovascular risk factors: Obesity: yes Body mass index is 31.67 kg/m.  Hypertension: yes Hyperlipidemia. Yes,  Lipids: He was previously on Vytorin and Lipitor. He thinks he had myalgias and weakness when taking these drugs. He has been recommended statin drugs in the lipid clinic but he refuses to take them stating that they will not help him. Also has significantly increased LDL particle number. He was prescribed Praluent  at the cardiology clinic but he refused this. Has hypercholesterolemia and history of coronary artery bypass  surgery. He was also suggested Leqvio previously and he refused  Current / Home Diabetic regimen includes:  Novolin N 10 units with BF and 10 units at supper, he sometimes adjust dose of Novolin N. Novolog  5-15 units with meals three times a day plus sliding scale.  He sometimes ? correct for hyperglycemia at bedtime with NovoLog .  He prefers to stay on NPH insulin  instead of basal insulin  like Lantus .  He thinks NPH works better for him.  Prior diabetic medications: Glucophage/metformin, ?  Nausea/malaise. Actos, edema. Farxiga  was suggested in the past-never started due to concern of side effects.  Glycemic data:    CONTINUOUS GLUCOSE MONITORING SYSTEM (CGMS) INTERPRETATION: At today's visit, we reviewed CGM downloads. The full report is scanned in the media. Reviewing the CGM trends, blood glucose are as follows:  Dexcom G7 CGM-  Sensor Download (Sensor download was reviewed and summarized below.) Dates: July 31 to Matthew [name] 13, 2025, 14 days  Glucose Management Indicator: 7.7%     Interpretation: Variable blood sugar.  Hyperglycemia with blood sugar up to 200-250 related to meals, postprandially.  Most of the time blood sugar overnight and in between the meals are acceptable.  Random hypoglycemia around midnight, pattern seems to be related to using mealtime insulin  NovoLog  for supper.  No concerning hypoglycemia in the afternoon.  Hypoglycemia: Patient has some hypoglycemic episodes.  Patient has hypoglycemia awareness.  Factors modifying glucose control: 1.  Diabetic diet assessment: 2-3 meals a day.  2.  Staying active or exercising: Limited physical activity decreased exercise,  Significantly.  3.  Medication compliance: compliant most of the time.  Interval history  CGM data as reviewed above.  Variable blood sugar and hypoglycemia around midnight intermittently.  Diabetes regimen as reviewed and noted above.  He adjust the dose of Novolin and and NovoLog  based on meal  and blood sugar around that time.  Hemoglobin A1c improved to 7.6%.  In the last visit he had autoantibodies test positive for gad 65 and zinc transporter 8 antibodies consistent with having type 1 diabetes mellitus.  Ozempic  was planned in the last visit however he did not like to take it, did not start due to concern about side effects.  REVIEW OF SYSTEMS As per history of present illness.   PAST MEDICAL HISTORY: Past Medical History:  Diagnosis Date   Anemia    Arthritis    back, left ankle (11/20/2015)   Cataract    CHF (congestive heart failure) (HCC)    Chronic lower back pain    Coronary artery disease    a. 05/2009 CABG x 3: LIMA->LAD, VG->OM, VG->PDA; b. Nuc 01/2014: inf-lat scar but no ischemia, EF 57%.   Diabetic retinopathy (HCC)    mild- Dr. Cleatus   Glaucoma    Hyperlipidemia    a. Intolerant of lipitor and vytorin.   Lyme disease    Migraine    once or twice (11/20/2015)   Mitral stenosis    Mild to moderate with mean mitral valve gradient 4 mmHg by echo 06/2023   Myocardial infarction [facility])    'saw evidence of it on an EKG done in 2005   Paroxysmal atrial flutter (HCC)    a. 01/2014 s/p DCCV;  b. CHA2DS2VASc = 3-->Eliquis .   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 Verlin and Dr. 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 01/2014 Echo: Ef 60-65%, no rwma, mild AI/MS, mod MR, mildly dil LA.    PAST SURGICAL HISTORY: Past Surgical History:  Procedure Laterality Date   ABDOMINAL EXPLORATION SURGERY  ~ 2005   for hernia, but didn't have one   ATRIAL FIBRILLATION ABLATION N/A 09/17/2016   Procedure: Atrial Fibrillation Ablation;  Surgeon: Inocencio Soyla Lunger, MD;  Location: [facility] INVASIVE CV LAB;  Service: Cardiovascular;   Laterality: N/A;   CARDIAC CATHETERIZATION  2011   CARDIOVERSION N/A 02/19/2014   Procedure: CARDIOVERSION;  Surgeon: Victory LELON Claudene DOUGLAS, MD;  Location: [facility] ENDOSCOPY;  Service: Cardiovascular;  Laterality: N/A;   CARDIOVERSION N/A 10/28/2015   Procedure: CARDIOVERSION;  Surgeon: Aleene JINNY Passe, MD;  Location: [facility] ENDOSCOPY;  Service: Cardiovascular;  Laterality: N/A;   CARDIOVERSION  11/20/2015   200 joules   CARDIOVERSION N/A 07/02/2022   Procedure: CARDIOVERSION;  Surgeon: Lonni Slain, MD;  Location: [facility] ENDOSCOPY;  Service: Cardiovascular;  Laterality: N/A;   CARDIOVERSION N/A 11/10/2022   Procedure: CARDIOVERSION;  Surgeon: Francyne Headland, MD;  Location: [facility] INVASIVE CV LAB;  Service: Cardiovascular;  Laterality: N/A;   CORONARY ARTERY BYPASS GRAFT  05/2009   LIMA-LAD, SVG-OM, SVG-PDA 06/20/09  ELBOW FRACTURE SURGERY Left    broken ulna on left elbow-surgical repair   ELECTROPHYSIOLOGIC STUDY N/A 10/31/2014   Procedure: A-Flutter;  Surgeon: Danelle LELON Birmingham, MD;  Location: [facility] INVASIVE CV LAB;  Service: Cardiovascular;  Laterality: N/A;   ELECTROPHYSIOLOGIC STUDY N/A 11/20/2015   Procedure: A-Flutter Ablation;  Surgeon: Danelle LELON Birmingham, MD;  Location: [facility] INVASIVE CV LAB;  Service: Cardiovascular;  Laterality: N/A;   EYE SURGERY     FRACTURE SURGERY     INTRAOPERATIVE TRANSTHORACIC ECHOCARDIOGRAM N/A 06/02/2022   Procedure: INTRAOPERATIVE TRANSTHORACIC ECHOCARDIOGRAM;  Surgeon: Verlin Lonni BIRCH, MD;  Location[facility] OR;  Service: Open Heart Surgery;  Laterality: N/A;   PACEMAKER IMPLANT N/A 06/03/2022   Procedure: PACEMAKER IMPLANT;  Surgeon: Inocencio Soyla Lunger, MD;  Location: [facility] INVASIVE CV LAB;  Service: Cardiovascular;  Laterality: N/A;   RIGHT/LEFT HEART CATH AND CORONARY/GRAFT ANGIOGRAPHY N/A 04/13/2022   Procedure: RIGHT/LEFT HEART CATH AND CORONARY/GRAFT ANGIOGRAPHY;  Surgeon: Verlin Lonni BIRCH, MD;  Location: [facility] INVASIVE CV LAB;  Service: Cardiovascular;  Laterality: N/A;    TEE WITHOUT CARDIOVERSION N/A 03/13/2022   Procedure: TRANSESOPHAGEAL ECHOCARDIOGRAM (TEE);  Surgeon: Francyne Headland, MD;  Location: [facility] ENDOSCOPY;  Service: Cardiovascular;  Laterality: N/A;   TESTICLE REMOVAL Right 1982   TONSILLECTOMY  ~ 1967   TRANSCATHETER AORTIC VALVE REPLACEMENT, TRANSFEMORAL N/A 06/02/2022   Procedure: Transcatheter Aortic Valve Replacement, Transfemoral;  Surgeon: Verlin Lonni BIRCH, MD;  Location: [facility] OR;  Service: Open Heart Surgery;  Laterality: N/A;    ALLERGIES: Allergies  Allergen Reactions   Bee Venom Swelling and Other (See Comments)    honey bee venom   Codeine Hives, Nausea Only and Rash    codeine   Pioglitazone Swelling and Other (See Comments)    pioglitazone   Metformin Nausea Only   Statins Other (See Comments)    Muscle pain, fatigue   Raspberry Itching and Hives    FAMILY HISTORY:  Family History  Problem Relation Age of Onset   Diabetes Mother    Heart disease Mother        Heart murmur   Heart disease Father    Heart attack Father    Diabetes Father    Hypertension Father    Diabetes Sister     SOCIAL HISTORY: Social History   Socioeconomic History   Marital status: Married    Spouse name: Not on file   Number of children: 0   Years of education: Not on file   Highest education level: Bachelor's degree (e.g., BA, AB, BS)  Occupational History   Occupation: Insurance account manager  Tobacco Use   Smoking status: Former    Current packs/day: 0.00    Average packs/day: 0.5 packs/day for 15.0 years (7.5 ttl pk-yrs)    Types: Cigarettes    Start date: 15    Quit date: 1990    Years since quitting: 35.6   Smokeless tobacco: Never   Tobacco comments:    quit smoking cigarettes in the 1980's; don't know how much or for how long  Vaping Use   Vaping status: Never Used  Substance and Sexual Activity   Alcohol use: Yes    Alcohol/week: 3.0 standard drinks of alcohol    Types: 3 Cans of beer per week   Drug use: No     Comment: quit smoking pot in the 1990s   Sexual activity: Not Currently    Birth control/protection: Coitus interruptus  Other Topics Concern   Not on file  Social History Narrative  Not on file   Social Drivers of Health   Financial Resource Strain: Patient Declined (06/30/2023)   Overall Financial Resource Strain (CARDIA)    Difficulty of Paying Living Expenses: Patient declined  Food Insecurity: Patient Declined (06/30/2023)   Hunger Vital Sign    Worried About Running Out of Food in the Last Year: Patient declined    Ran Out of Food in the Last Year: Patient declined  Transportation Needs: No Transportation Needs (06/30/2023)   PRAPARE - Administrator, Civil Service (Medical): No    Lack of Transportation (Non-Medical): No  Physical Activity: Unknown (06/30/2023)   Exercise Vital Sign    Days of Exercise per Week: 0 days    Minutes of Exercise per Session: Not on file  Stress: No Stress Concern Present (06/30/2023)   [employer] of Occupational Health - Occupational Stress Questionnaire    Feeling of Stress : Not at all  Social Connections: Unknown (06/30/2023)   Social Connection and Isolation Panel    Frequency of Communication with Friends and Family: More than three times a week    Frequency of Social Gatherings with Friends and Family: Three times a week    Attends Religious Services: Patient declined    Active Member of Clubs or Organizations: No    Attends Engineer, structural: Not on file    Marital Status: Married    MEDICATIONS:  Current Outpatient Medications  Medication Sig Dispense Refill   apixaban  (ELIQUIS ) 5 MG TABS tablet Take 1 tablet (5 mg total) by mouth 2 (two) times daily. 60 tablet 5   BD VEO INSULIN  SYRINGE U/F 31G X 15/64 0.5 ML MISC USE TO INJECT 4 TIMES A DAY 400 each 3   Continuous Blood Gluc Sensor (DEXCOM G7 SENSOR) MISC by Does not apply route.     cyanocobalamin  (VITAMIN B12) 1000 MCG tablet Take 1,000 mcg by mouth  daily.     dorzolamide -timolol  (COSOPT ) 22.3-6.8 MG/ML ophthalmic solution Place 1 drop into the left eye 2 (two) times daily.     furosemide  (LASIX ) 40 MG tablet Take 40 mg by mouth as needed for fluid or edema.     lisinopril  (ZESTRIL ) 5 MG tablet TAKE 1 TABLET BY MOUTH EVERY DAY 90 tablet 2   metoprolol  succinate (TOPROL -XL) 100 MG 24 hr tablet Take 1 tablet (100 mg total) by mouth daily. Take with or immediately following a meal. Please call [phone] to schedule an overdue appointment for future refills. Thank you. 1st attempt. 30 tablet 0   potassium chloride  (KLOR-CON ) 10 MEQ tablet TAKE 1 TABLET BY MOUTH EVERY DAY 90 tablet 1   insulin  aspart (NOVOLOG ) 100 UNIT/ML injection Inject 5-15 Units into the skin 3 (three) times daily with meals. Maximum 45 units / day. 30 mL 3   insulin  NPH Human (NOVOLIN N) 100 UNIT/ML injection Inject 0.15 mLs (15 Units total) into the skin 2 (two) times daily before a meal. 20 mL 3   No current facility-administered medications for this visit.    PHYSICAL EXAM: Vitals:   12/08/23 1023  BP: 128/80  Pulse: 69  Resp: 20  SpO2: 98%  Weight: 196 lb 3.2 oz (89 kg)  Height: 5' 6 (1.676 m)     Body mass index is 31.67 kg/m.  Wt Readings from Last 3 Encounters:  12/08/23 196 lb 3.2 oz (89 kg)  08/25/23 197 lb 12.8 oz (89.7 kg)  08/19/23 199 lb 3.2 oz (90.4 kg)    General: Well developed,  well nourished male in no apparent distress.  HEENT: AT/[place], no external lesions.  Eyes: Conjunctiva clear and no icterus. Neck: Neck supple  Lungs: Respirations not labored Neurologic: Alert, oriented, normal speech Extremities / Skin: Dry.  Bilateral pedal edema. Psychiatric: Does not appear depressed or anxious  Diabetic Foot Exam - Simple   No data filed    LABS Reviewed Lab Results  Component Value Date   HGBA1C 7.6 (A) 12/08/2023   HGBA1C 8.6 (H) 08/17/2023   HGBA1C 7.9 (A) 05/18/2023   Lab Results  Component Value Date   FRUCTOSAMINE 337 (H)  10/11/2019   FRUCTOSAMINE 321 (H) 03/07/2018   FRUCTOSAMINE 381 (H) 12/10/2015   Lab Results  Component Value Date   CHOL 207 (H) 08/17/2023   HDL 43 08/17/2023   LDLCALC 142 (H) 08/17/2023   TRIG 105 08/17/2023   CHOLHDL 4.8 08/17/2023   Lab Results  Component Value Date   MICRALBCREAT 72 (H) 08/17/2023   MICRALBCREAT 89 (H) 07/08/2020   Lab Results  Component Value Date   CREATININE 1.35 (H) 08/17/2023   Lab Results  Component Value Date   GFR 66.60 10/23/2021    ASSESSMENT / PLAN  1. Type 1 diabetes mellitus with other specified complication (HCC)   2. Uncontrolled type 1 diabetes mellitus with hyperglycemia (HCC)    Diabetes Mellitus type 1, complicated by diabetic retinopathy/CAD. - Diabetic status / severity: Uncontrolled.  Lab Results  Component Value Date   HGBA1C 7.6 (A) 12/08/2023    - Hemoglobin A1c goal : < 6.5%  Patient had significantly elevated gad 65 and zinc transporter 8 antibodies consistent with having type 1 diabetes mellitus in April 2025.  Prior to that he was being managed as type 2 diabetes mellitus.   - Medications: Adjusted diabetes regimen as follows.  Advised to stay on fixed dose of Novolin N and unless blood sugar is significantly low or significantly high.  -Take Novolin and 10 units with meals 2 times a day.  -Adjust NovoLog  5 to 15 units with meals 3 times a day.  Advised to take mostly 5 to 10 units for the regular meal.  Unless meal has significant amount of carbohydrates okay to take up to 15 units of NovoLog .  -He has hypoglycemia around midnight seems to be related to using NovoLog  for supper.  Advised to use relatively low-dose of NovoLog  no more than 10 units before supper to avoid hypoglycemia.  NovoLog  can be adjusted based on meal size and blood sugar, discussed in detail.  - Home glucose testing: Dexcom G7 and check as needed.    - Discussed/ Gave Hypoglycemia treatment plan.  # Consult : not required at this  time.   # Annual urine for microalbuminuria/ creatinine ratio, + microalbuminuria currently, continue ACE/ARB /lisinopril .   Last  Lab Results  Component Value Date   MICRALBCREAT 72 (H) 08/17/2023    # Foot check nightly.  # Patient has diabetic retinopathy, following with ophthalmology [place] specialist regularly.   - Diet: Make healthy diabetic food choices - Life style / activity / exercise: Discussed.  2. Blood pressure  -  BP Readings from Last 1 Encounters:  12/08/23 128/80    - Control is  in target.  - No change in current plans.    3. Lipid status / Hyperlipidemia - Last  Lab Results  Component Value Date   LDLCALC 142 (H) 08/17/2023   -Patient not on a statin.  Patient does not want to be on a  statin therapy.  He has been following with cardiology as well.  He has coronary artery disease status post CABG.  # Patient has bilateral pedal edema.  Does not take furosemide  regularly.  Advised to follow-up and discussed with cardiology and nephrology.  Diagnoses and all orders for this visit:  Type 1 diabetes mellitus with other specified complication (HCC) -     POCT glycosylated hemoglobin (Hb A1C) -     insulin  aspart (NOVOLOG ) 100 UNIT/ML injection; Inject 5-15 Units into the skin 3 (three) times daily with meals. Maximum 45 units / day. -     insulin  NPH Human (NOVOLIN N) 100 UNIT/ML injection; Inject 0.15 mLs (15 Units total) into the skin 2 (two) times daily before a meal.  Uncontrolled type 1 diabetes mellitus with hyperglycemia (HCC)   DISPOSITION Follow up in clinic in 3 months suggested.     All questions answered and patient verbalized understanding of the plan.  Matthew Senai Kingsley, MD [facility] Endocrinology [facility] [address] [place], [address] [place], [place] [address] Phone # [phone]  At least part of this note was generated using voice recognition software. Inadvertent word errors may have occurred, which were not recognized  during the proofreading process.

## 2023-12-08 NOTE — Patient Instructions (Signed)
 Latest Reference Range & Units 02/15/23 10:39 05/18/23 09:49 08/17/23 09:49 12/08/23 10:26  Hemoglobin A1C 4.0 - 5.6 % 8.0 ! Pend 7.9 ! Pend 8.6 (H) 7.6 !  !: Data is abnormal (H): Data is abnormally high

## 2023-12-10 ENCOUNTER — Ambulatory Visit (INDEPENDENT_AMBULATORY_CARE_PROVIDER_SITE_OTHER): Payer: Medicare HMO

## 2023-12-10 DIAGNOSIS — I442 Atrioventricular block, complete: Secondary | ICD-10-CM | POA: Diagnosis not present

## 2023-12-13 LAB — CUP PACEART REMOTE DEVICE CHECK
Battery Remaining Longevity: 109 mo
Battery Remaining Percentage: 91 %
Battery Voltage: 3.01 V
Brady Statistic AP VP Percent: 1 %
Brady Statistic AP VS Percent: 16 %
Brady Statistic AS VP Percent: 1 %
Brady Statistic AS VS Percent: 83 %
Brady Statistic RA Percent Paced: 14 %
Brady Statistic RV Percent Paced: 1.5 %
Date Time Interrogation Session: 20250815020015
Implantable Lead Connection Status: 753985
Implantable Lead Connection Status: 753985
Implantable Lead Implant Date: 20240207
Implantable Lead Implant Date: 20240207
Implantable Lead Location: 753859
Implantable Lead Location: 753860
Implantable Pulse Generator Implant Date: 20240207
Lead Channel Impedance Value: 400 Ohm
Lead Channel Impedance Value: 540 Ohm
Lead Channel Pacing Threshold Amplitude: 0.75 V
Lead Channel Pacing Threshold Amplitude: 0.75 V
Lead Channel Pacing Threshold Pulse Width: 0.5 ms
Lead Channel Pacing Threshold Pulse Width: 0.5 ms
Lead Channel Sensing Intrinsic Amplitude: 12 mV
Lead Channel Sensing Intrinsic Amplitude: 2.4 mV
Lead Channel Setting Pacing Amplitude: 2 V
Lead Channel Setting Pacing Amplitude: 2.5 V
Lead Channel Setting Pacing Pulse Width: 0.5 ms
Lead Channel Setting Sensing Sensitivity: 2 mV
Pulse Gen Model: 2272
Pulse Gen Serial Number: 8154174

## 2023-12-16 ENCOUNTER — Ambulatory Visit: Payer: Self-pay | Admitting: Cardiology

## 2023-12-21 ENCOUNTER — Other Ambulatory Visit: Payer: Self-pay | Admitting: Endocrinology

## 2023-12-21 ENCOUNTER — Other Ambulatory Visit: Payer: Self-pay

## 2023-12-21 DIAGNOSIS — E1069 Type 1 diabetes mellitus with other specified complication: Secondary | ICD-10-CM

## 2023-12-21 MED ORDER — DEXCOM G7 SENSOR MISC: Status: AC

## 2023-12-21 MED ORDER — DEXCOM G7 SENSOR MISC: 1 refills | Status: AC

## 2023-12-30 ENCOUNTER — Telehealth: Payer: Self-pay

## 2023-12-30 NOTE — Telephone Encounter (Signed)
Patient would like an update on referral

## 2023-12-30 NOTE — Telephone Encounter (Signed)
 Copied from CRM [id number]. Topic: Referral - Status >> Dec 30, 2023  8:42 AM Pinkey ORN wrote: Reason for CRM: Referral Status >> Dec 30, 2023  8:44 AM Pinkey ORN wrote: Patient is requesting a call back with the status of his referral request. Please follow up with patient at [phone]

## 2024-01-03 ENCOUNTER — Other Ambulatory Visit: Payer: Self-pay

## 2024-01-03 DIAGNOSIS — E1165 Type 2 diabetes mellitus with hyperglycemia: Secondary | ICD-10-CM

## 2024-01-03 MED ORDER — BD VEO INSULIN SYRINGE U/F 31G X 15/64" 0.5 ML MISC: 3 refills | Status: AC

## 2024-01-04 ENCOUNTER — Ambulatory Visit: Admitting: Podiatry

## 2024-01-04 ENCOUNTER — Ambulatory Visit

## 2024-01-04 VITALS — Ht 66.0 in | Wt 192.0 lb

## 2024-01-04 DIAGNOSIS — B351 Tinea unguium: Secondary | ICD-10-CM | POA: Diagnosis not present

## 2024-01-04 DIAGNOSIS — L600 Ingrowing nail: Secondary | ICD-10-CM

## 2024-01-04 NOTE — Progress Notes (Signed)
  Subjective:  Patient ID: Matthew [name], male    DOB: November 20, 1945,  MRN: [id number]  Chief Complaint  Patient presents with   Ingrown Toenail    RM 1 Patient is here for ingrown toe nail of the right hallux. Patient states no pain after using hydrogen peroxide, would like a nail check.    78 y.o. male presents with the above complaint. History confirmed with patient.   Objective:  Physical Exam: warm, good capillary refill, no trophic changes or ulcerative lesions, normal DP and PT pulses, normal sensory exam, and resolved infection of the medial hallux border no pain to palpation has superficial white onychomycosis on lateral edge.  Assessment:   1. Ingrowing right great toenail      Plan:  Patient was evaluated and treated and all questions answered.  Debrided the excess of skin and hypertrophic nail around the ingrown site this seems to have resolved.  Discussed the returns to begin Epsom salt soaking and use of Neosporin on nail fold and notify me for appointment for partial permanent matricectomy.  We also discussed he has onychomycosis and he has an OTC medication that he is using.  Recommend continuing to use this and if not improving consider use of topical prescription such as Penlac.  Return as needed.  No follow-ups on file.

## 2024-01-06 ENCOUNTER — Other Ambulatory Visit: Payer: Self-pay | Admitting: Cardiology

## 2024-01-06 DIAGNOSIS — E1165 Type 2 diabetes mellitus with hyperglycemia: Secondary | ICD-10-CM | POA: Diagnosis not present

## 2024-01-07 ENCOUNTER — Inpatient Hospital Stay: Attending: Hematology and Oncology

## 2024-01-07 ENCOUNTER — Other Ambulatory Visit: Payer: Self-pay | Admitting: Hematology and Oncology

## 2024-01-07 DIAGNOSIS — E538 Deficiency of other specified B group vitamins: Secondary | ICD-10-CM | POA: Insufficient documentation

## 2024-01-07 DIAGNOSIS — D649 Anemia, unspecified: Secondary | ICD-10-CM | POA: Diagnosis not present

## 2024-01-07 DIAGNOSIS — R768 Other specified abnormal immunological findings in serum: Secondary | ICD-10-CM | POA: Diagnosis not present

## 2024-01-07 LAB — CMP (CANCER CENTER ONLY)
ALT: 13 U/L (ref 0–44)
AST: 17 U/L (ref 15–41)
Albumin: 3.8 g/dL (ref 3.5–5.0)
Alkaline Phosphatase: 89 U/L (ref 38–126)
Anion gap: 5 (ref 5–15)
BUN: 23 mg/dL (ref 8–23)
CO2: 27 mmol/L (ref 22–32)
Calcium: 8.7 mg/dL — ABNORMAL LOW (ref 8.9–10.3)
Chloride: 105 mmol/L (ref 98–111)
Creatinine: 1.33 mg/dL — ABNORMAL HIGH (ref 0.61–1.24)
GFR, Estimated: 56 mL/min — ABNORMAL LOW (ref >60)
Glucose, Bld: 329 mg/dL — ABNORMAL HIGH (ref 70–99)
Potassium: 4.9 mmol/L (ref 3.5–5.1)
Sodium: 137 mmol/L (ref 135–145)
Total Bilirubin: 0.6 mg/dL (ref 0.0–1.2)
Total Protein: 6.9 g/dL (ref 6.5–8.1)

## 2024-01-07 LAB — CBC WITH DIFFERENTIAL (CANCER CENTER ONLY)
Abs Immature Granulocytes: 0.02 K/uL (ref 0.00–0.07)
Basophils Absolute: 0 K/uL (ref 0.0–0.1)
Basophils Relative: 1 %
Eosinophils Absolute: 0.3 K/uL (ref 0.0–0.5)
Eosinophils Relative: 5 %
HCT: 35.6 % — ABNORMAL LOW (ref 39.0–52.0)
Hemoglobin: 12 g/dL — ABNORMAL LOW (ref 13.0–17.0)
Immature Granulocytes: 0 %
Lymphocytes Relative: 21 %
Lymphs Abs: 1.2 K/uL (ref 0.7–4.0)
MCH: 29.3 pg (ref 26.0–34.0)
MCHC: 33.7 g/dL (ref 30.0–36.0)
MCV: 87 fL (ref 80.0–100.0)
Monocytes Absolute: 0.5 K/uL (ref 0.1–1.0)
Monocytes Relative: 8 %
Neutro Abs: 3.8 K/uL (ref 1.7–7.7)
Neutrophils Relative %: 65 %
Platelet Count: 205 K/uL (ref 150–400)
RBC: 4.09 MIL/uL — ABNORMAL LOW (ref 4.22–5.81)
RDW: 13.4 % (ref 11.5–15.5)
WBC Count: 5.9 K/uL (ref 4.0–10.5)
nRBC: 0 % (ref 0.0–0.2)

## 2024-01-07 LAB — VITAMIN B12: Vitamin B-12: 358 pg/mL (ref 180–914)

## 2024-01-12 ENCOUNTER — Telehealth: Payer: Self-pay | Admitting: Hematology and Oncology

## 2024-01-13 ENCOUNTER — Ambulatory Visit: Admitting: Hematology and Oncology

## 2024-01-13 LAB — METHYLMALONIC ACID, SERUM: Methylmalonic Acid, Quantitative: 164 nmol/L (ref 0–378)

## 2024-01-14 ENCOUNTER — Other Ambulatory Visit

## 2024-01-15 NOTE — Progress Notes (Unsigned)
 [facility] OFFICE PROGRESS NOTE  Levora Reyes SAUNDERS, MD [phone] [place] [address]  DIAGNOSIS:  Hematological/Oncological History # Normocytic Anemia # Elevated Serum Free Light Chain Ratio  # Vitamin B12 Deficiency  06/02/2022: SPEP showed no M protein, Kappa 38.2, Lambda 19.8, K/L ratio 1.93 07/13/2022: establish care with Dr. Federico   CURRENT THERAPY: Observation ***B12 and iron ***  INTERVAL HISTORY: Matthew [name] 78 y.o. male returns to clinic today for follow-up visit.  The patient was last seen by Dr. Federico on 07/21/2023.  The patient is followed for history of B12 deficiency and anemia.  ***Should be taking B12 and iron   The patient denies any major changes in his health since he was last seen.  He denies any changes in his energy.  Vitamin supplements?  Takes B12?  Overall his energy is ***.  He denies any abnormal bleeding or bruising.  He denies any significant lightheadedness, dizziness, shortness of breath, palpitations, or syncope.  He reports a well-balanced diet including broccoli, beans, and spinach.  Who is a follow-up for diabetes?  Blood sugar was a little elevated?  He denies any fever, chills, night sweats, nausea, vomiting, or diarrhea.  He is here to review his lab results     MEDICAL HISTORY: Past Medical History:  Diagnosis Date   Anemia    Arthritis    back, left ankle (11/20/2015)   Cataract    CHF (congestive heart failure) (HCC)    Chronic lower back pain    Coronary artery disease    a. 05/2009 CABG x 3: LIMA->LAD, VG->OM, VG->PDA; b. Nuc 01/2014: inf-lat scar but no ischemia, EF 57%.   Diabetic retinopathy (HCC)    mild- Dr. Cleatus   Glaucoma    Hyperlipidemia    a. Intolerant of lipitor and vytorin.   Lyme disease    Migraine    once or twice (11/20/2015)   Mitral stenosis    Mild to moderate with mean mitral valve gradient 4 mmHg by echo 06/2023   Myocardial infarction [facility])    'saw evidence of it on  an EKG done in 2005   Paroxysmal atrial flutter (HCC)    a. 01/2014 s/p DCCV;  b. CHA2DS2VASc = 3-->Eliquis .   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 Verlin and Dr. 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 01/2014 Echo: Ef 60-65%, no rwma, mild AI/MS, mod MR, mildly dil LA.    ALLERGIES:  is allergic to bee venom, codeine, pioglitazone, metformin, statins, and raspberry.  MEDICATIONS:  Current Outpatient Medications  Medication Sig Dispense Refill   apixaban  (ELIQUIS ) 5 MG TABS tablet Take 1 tablet (5 mg total) by mouth 2 (two) times daily. 60 tablet 5   Continuous Glucose Sensor (DEXCOM G7 SENSOR) MISC Change sensor every 10 days as directed by manufacturer 9 each 1   Continuous Glucose Sensor (DEXCOM G7 SENSOR) MISC Use to check glucose continuously, change sensor every 10 days     cyanocobalamin  (VITAMIN B12) 1000 MCG tablet Take 1,000 mcg by mouth daily.     dorzolamide -timolol  (COSOPT ) 22.3-6.8 MG/ML ophthalmic solution Place 1 drop into the left eye  2 (two) times daily.     furosemide  (LASIX ) 40 MG tablet Take 40 mg by mouth as needed for fluid or edema.     insulin  aspart (NOVOLOG ) 100 UNIT/ML injection Inject 5-15 Units into the skin 3 (three) times daily with meals. Maximum 45 units / day. 30 mL 3   insulin  NPH Human (NOVOLIN N) 100 UNIT/ML injection Inject 0.15 mLs (15 Units total) into the skin 2 (two) times daily before a meal. 20 mL 3   Insulin  Syringe-Needle U-100 (BD VEO INSULIN  SYRINGE U/F) 31G X 15/64 0.5 ML MISC Use to inject 4 times a day 400 each 3   lisinopril  (ZESTRIL ) 5 MG tablet TAKE 1 TABLET BY MOUTH EVERY DAY 90 tablet 2   metoprolol  succinate (TOPROL -XL) 100 MG 24 hr tablet TAKE 1 TABLET (100 MG TOTAL) BY MOUTH DAILY. TAKE  WITH OR IMMEDIATELY FOLLOWING A MEAL. PLEASE CALL [phone] TO SCHEDULE AN OVERDUE APPOINTMENT FOR FUTURE REFILLS. THANK YOU. 1ST ATTEMPT. 15 tablet 0   potassium chloride  (KLOR-CON ) 10 MEQ tablet TAKE 1 TABLET BY MOUTH EVERY DAY 90 tablet 1   No current facility-administered medications for this visit.    SURGICAL HISTORY:  Past Surgical History:  Procedure Laterality Date   ABDOMINAL EXPLORATION SURGERY  ~ 2005   for hernia, but didn't have one   ATRIAL FIBRILLATION ABLATION N/A 09/17/2016   Procedure: Atrial Fibrillation Ablation;  Surgeon: Inocencio Soyla Lunger, MD;  Location: [facility] INVASIVE CV LAB;  Service: Cardiovascular;  Laterality: N/A;   CARDIAC CATHETERIZATION  2011   CARDIOVERSION N/A 02/19/2014   Procedure: CARDIOVERSION;  Surgeon: Victory LELON Claudene DOUGLAS, MD;  Location: [facility] ENDOSCOPY;  Service: Cardiovascular;  Laterality: N/A;   CARDIOVERSION N/A 10/28/2015   Procedure: CARDIOVERSION;  Surgeon: Aleene JINNY Passe, MD;  Location: [facility] ENDOSCOPY;  Service: Cardiovascular;  Laterality: N/A;   CARDIOVERSION  11/20/2015   200 joules   CARDIOVERSION N/A 07/02/2022   Procedure: CARDIOVERSION;  Surgeon: Lonni Slain, MD;  Location: [facility] ENDOSCOPY;  Service: Cardiovascular;  Laterality: N/A;   CARDIOVERSION N/A 11/10/2022   Procedure: CARDIOVERSION;  Surgeon: Francyne Headland, MD;  Location: [facility] INVASIVE CV LAB;  Service: Cardiovascular;  Laterality: N/A;   CORONARY ARTERY BYPASS GRAFT  05/2009   LIMA-LAD, SVG-OM, SVG-PDA 06/20/09   ELBOW FRACTURE SURGERY Left    broken ulna on left elbow-surgical repair   ELECTROPHYSIOLOGIC STUDY N/A 10/31/2014   Procedure: A-Flutter;  Surgeon: Danelle LELON Birmingham, MD;  Location: [facility] INVASIVE CV LAB;  Service: Cardiovascular;  Laterality: N/A;   ELECTROPHYSIOLOGIC STUDY N/A 11/20/2015   Procedure: A-Flutter Ablation;  Surgeon: Danelle LELON Birmingham, MD;  Location: [facility] INVASIVE CV LAB;  Service: Cardiovascular;  Laterality: N/A;   EYE SURGERY     FRACTURE SURGERY      INTRAOPERATIVE TRANSTHORACIC ECHOCARDIOGRAM N/A 06/02/2022   Procedure: INTRAOPERATIVE TRANSTHORACIC ECHOCARDIOGRAM;  Surgeon: Verlin Lonni BIRCH, MD;  Location[facility] OR;  Service: Open Heart Surgery;  Laterality: N/A;   PACEMAKER IMPLANT N/A 06/03/2022   Procedure: PACEMAKER IMPLANT;  Surgeon: Inocencio Soyla Lunger, MD;  Location: [facility] INVASIVE CV LAB;  Service: Cardiovascular;  Laterality: N/A;   RIGHT/LEFT HEART CATH AND CORONARY/GRAFT ANGIOGRAPHY N/A 04/13/2022   Procedure: RIGHT/LEFT HEART CATH AND CORONARY/GRAFT ANGIOGRAPHY;  Surgeon: Verlin Lonni BIRCH, MD;  Location: [facility] INVASIVE CV LAB;  Service: Cardiovascular;  Laterality: N/A;   TEE WITHOUT CARDIOVERSION N/A 03/13/2022   Procedure: TRANSESOPHAGEAL ECHOCARDIOGRAM (TEE);  Surgeon: Francyne Headland, MD;  Location: [facility] ENDOSCOPY;  Service: Cardiovascular;  Laterality: N/A;  TESTICLE REMOVAL Right 1982   TONSILLECTOMY  ~ 1967   TRANSCATHETER AORTIC VALVE REPLACEMENT, TRANSFEMORAL N/A 06/02/2022   Procedure: Transcatheter Aortic Valve Replacement, Transfemoral;  Surgeon: Verlin Lonni BIRCH, MD;  Location: [facility] OR;  Service: Open Heart Surgery;  Laterality: N/A;    REVIEW OF SYSTEMS:   Review of Systems  Constitutional: Negative for appetite change, chills, fatigue, fever and unexpected weight change.  HENT:   Negative for mouth sores, nosebleeds, sore throat and trouble swallowing.   Eyes: Negative for eye problems and icterus.  Respiratory: Negative for cough, hemoptysis, shortness of breath and wheezing.   Cardiovascular: Negative for chest pain and leg swelling.  Gastrointestinal: Negative for abdominal pain, constipation, diarrhea, nausea and vomiting.  Genitourinary: Negative for bladder incontinence, difficulty urinating, dysuria, frequency and hematuria.   Musculoskeletal: Negative for back pain, gait problem, neck pain and neck stiffness.  Skin: Negative for itching and rash.  Neurological: Negative for dizziness, extremity  weakness, gait problem, headaches, light-headedness and seizures.  Hematological: Negative for adenopathy. Does not bruise/bleed easily.  Psychiatric/Behavioral: Negative for confusion, depression and sleep disturbance. The patient is not nervous/anxious.     PHYSICAL EXAMINATION:  There were no vitals taken for this visit.  ECOG PERFORMANCE STATUS: {CHL ONC ECOG [id number]  Physical Exam  Constitutional: Oriented to person, place, and time and well-developed, well-nourished, and in no distress. No distress.  HENT:  Head: Normocephalic and atraumatic.  Mouth/Throat: Oropharynx is clear and moist. No oropharyngeal exudate.  Eyes: Conjunctivae are normal. Right eye exhibits no discharge. Left eye exhibits no discharge. No scleral icterus.  Neck: Normal range of motion. Neck supple.  Cardiovascular: Normal rate, regular rhythm, normal heart sounds and intact distal pulses.   Pulmonary/Chest: Effort normal and breath sounds normal. No respiratory distress. No wheezes. No rales.  Abdominal: Soft. Bowel sounds are normal. Exhibits no distension and no mass. There is no tenderness.  Musculoskeletal: Normal range of motion. Exhibits no edema.  Lymphadenopathy:    No cervical adenopathy.  Neurological: Alert and oriented to person, place, and time. Exhibits normal muscle tone. Gait normal. Coordination normal.  Skin: Skin is warm and dry. No rash noted. Not diaphoretic. No erythema. No pallor.  Psychiatric: Mood, memory and judgment normal.  Vitals reviewed.  LABORATORY DATA: Lab Results  Component Value Date   WBC 5.9 01/07/2024   HGB 12.0 (L) 01/07/2024   HCT 35.6 (L) 01/07/2024   MCV 87.0 01/07/2024   PLT 205 01/07/2024      Chemistry      Component Value Date/Time   NA 137 01/07/2024 1040   NA 137 06/08/2022 0914   K 4.9 01/07/2024 1040   CL 105 01/07/2024 1040   CO2 27 01/07/2024 1040   BUN 23 01/07/2024 1040   BUN 25 06/08/2022 0914   CREATININE 1.33 (H) 01/07/2024  1040   CREATININE 1.35 (H) 08/17/2023 0949      Component Value Date/Time   CALCIUM  8.7 (L) 01/07/2024 1040   ALKPHOS 89 01/07/2024 1040   AST 17 01/07/2024 1040   ALT 13 01/07/2024 1040   BILITOT 0.6 01/07/2024 1040       RADIOGRAPHIC STUDIES:  No results found.   ASSESSMENT/PLAN:  Matthew [name] 78 y.o. male with medical history significant for vitamin B12 deficiency who presents for a follow up visit.   After review of the labs, review of the records, and discussion with the patient the patients findings are most consistent with normocytic anemia of unclear etiology and  elevated serum free light chain ratio.  Per Dr. Lafonda note, he has a low suspicion for AL amyloidosis as the cause of his findings cardiac findings.    # Normocytic Anemia, Chronic # Vitamin B12 Borderline Low  --Prior nutritional panel revealed a vitamin B12 of 200, borderline low. -- Amyloidosis and multiple myeloma workup as noted below -- If no clear etiology can be found in hemoglobin continues to drop would recommend considering a bone marrow biopsy. As his numbers are stable/slightly improved, will hold off on invasive procedure such as bone marrow biopsy at this time. --Labs today show modest improvement with white blood cell ***, hemoglobin ***, MCV 8***, platelets *** --Continue p.o. vitamin 1000 mcg p.o. daily. Patient has PO iron pills and notes he will start them as his iron levels are borderline***.  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 -- No evidence of monoclonal gammopathy, very unlikely probability of his cardiac findings representing AL amyloidosis.  No orders of the defined types were placed in this encounter.    I spent {CHL ONC TIME VISIT - DTPQU:[id number]} counseling the patient face to face. The  total time spent in the appointment was {CHL ONC TIME VISIT - DTPQU:[id number]}.  Johan Antonacci L Lashae Wollenberg, PA-C 01/15/24

## 2024-01-17 ENCOUNTER — Inpatient Hospital Stay (HOSPITAL_BASED_OUTPATIENT_CLINIC_OR_DEPARTMENT_OTHER): Admitting: Physician Assistant

## 2024-01-17 DIAGNOSIS — H401131 Primary open-angle glaucoma, bilateral, mild stage: Secondary | ICD-10-CM | POA: Diagnosis not present

## 2024-01-17 DIAGNOSIS — H3412 Central retinal artery occlusion, left eye: Secondary | ICD-10-CM | POA: Diagnosis not present

## 2024-01-17 DIAGNOSIS — H211X2 Other vascular disorders of iris and ciliary body, left eye: Secondary | ICD-10-CM | POA: Diagnosis not present

## 2024-01-17 DIAGNOSIS — D649 Anemia, unspecified: Secondary | ICD-10-CM | POA: Insufficient documentation

## 2024-01-17 DIAGNOSIS — E113511 Type 2 diabetes mellitus with proliferative diabetic retinopathy with macular edema, right eye: Secondary | ICD-10-CM | POA: Diagnosis not present

## 2024-01-17 DIAGNOSIS — E113512 Type 2 diabetes mellitus with proliferative diabetic retinopathy with macular edema, left eye: Secondary | ICD-10-CM | POA: Diagnosis not present

## 2024-01-17 DIAGNOSIS — H35352 Cystoid macular degeneration, left eye: Secondary | ICD-10-CM | POA: Diagnosis not present

## 2024-01-17 DIAGNOSIS — H35342 Macular cyst, hole, or pseudohole, left eye: Secondary | ICD-10-CM | POA: Diagnosis not present

## 2024-01-17 DIAGNOSIS — Z794 Long term (current) use of insulin: Secondary | ICD-10-CM | POA: Diagnosis not present

## 2024-01-17 DIAGNOSIS — H35372 Puckering of macula, left eye: Secondary | ICD-10-CM | POA: Diagnosis not present

## 2024-01-17 MED ORDER — FERROUS SULFATE 325 (65 FE) MG PO TBEC: 325.0000 mg | DELAYED_RELEASE_TABLET | Freq: Every day | ORAL | 3 refills | Status: AC

## 2024-01-18 ENCOUNTER — Telehealth: Payer: Self-pay | Admitting: Hematology and Oncology

## 2024-01-18 NOTE — Telephone Encounter (Signed)
 Scheduled appointments with the patient per the LOS notes.

## 2024-01-19 ENCOUNTER — Other Ambulatory Visit

## 2024-01-19 ENCOUNTER — Ambulatory Visit: Admitting: Hematology and Oncology

## 2024-01-19 NOTE — Progress Notes (Signed)
 Remote PPM Transmission

## 2024-01-20 DIAGNOSIS — R9431 Abnormal electrocardiogram [ECG] [EKG]: Secondary | ICD-10-CM | POA: Insufficient documentation

## 2024-01-20 DIAGNOSIS — E1139 Type 2 diabetes mellitus with other diabetic ophthalmic complication: Secondary | ICD-10-CM | POA: Insufficient documentation

## 2024-01-21 ENCOUNTER — Encounter: Payer: Self-pay | Admitting: Family Medicine

## 2024-01-21 ENCOUNTER — Ambulatory Visit: Admitting: Family Medicine

## 2024-01-21 VITALS — BP 128/60 | HR 60 | Temp 98.4°F | Resp 19 | Ht 66.0 in | Wt 197.0 lb

## 2024-01-21 DIAGNOSIS — I4891 Unspecified atrial fibrillation: Secondary | ICD-10-CM

## 2024-01-21 DIAGNOSIS — E785 Hyperlipidemia, unspecified: Secondary | ICD-10-CM

## 2024-01-21 DIAGNOSIS — I509 Heart failure, unspecified: Secondary | ICD-10-CM | POA: Diagnosis not present

## 2024-01-21 DIAGNOSIS — Z952 Presence of prosthetic heart valve: Secondary | ICD-10-CM | POA: Diagnosis not present

## 2024-01-21 DIAGNOSIS — D649 Anemia, unspecified: Secondary | ICD-10-CM

## 2024-01-21 DIAGNOSIS — R7989 Other specified abnormal findings of blood chemistry: Secondary | ICD-10-CM

## 2024-01-21 MED ORDER — POTASSIUM CHLORIDE ER 10 MEQ PO TBCR: 10.0000 meq | EXTENDED_RELEASE_TABLET | Freq: Every day | ORAL | 0 refills | Status: AC

## 2024-01-21 MED ORDER — FUROSEMIDE 40 MG PO TABS: 40.0000 mg | ORAL_TABLET | ORAL | 0 refills | Status: DC | PRN

## 2024-01-21 NOTE — Progress Notes (Signed)
 Subjective:  Patient ID: Matthew [name], male    DOB: 05-Nov-1945  Age: 78 y.o. MRN: [id number]  CC:  Chief Complaint  Patient presents with   Follow-up    6 month follow up. No questions or concerns.     HPI Matthew [name] presents for   Hyperlipidemia, cardiac He is followed by cardiology and electrophysiology with history of A-fib on chronic anticoagulation with Eliquis , heart block status post PPM followed by Dr. Inocencio, aortic valve replacement in February 2024 and CAD with prior bypass grafting.  He has declined statins as well as PCSK9 inhibitors for his hyperlipidemia. He remains on Toprol -XL 100 mg daily, potassium 10 mill equivalents daily, lisinopril  5 mg daily, Eliquis  5 mg twice daily. Still delines statins, or pcsk9 inhibitor.  History of central retinal artery occlusion of left eye earlier in the year.  Internal carotid artery stenosis, MR angiogram performed in April.  Soft tissue of the left carotid bifurcation likely resulting in stenosis although evaluation is limited on coronal postcontrast images.  Suggestion of at least 50% narrowing on 3D MIP images.  CTA neck as further evaluation option.  Patent arterial vasculature in the neck and unremarkable MRA of the circle of Willis. Injections every 6 weeks with optho. No improvement in vision.   Defers repeat lipids as would not take any Rx.   Has not used furosemide  in months - concerned about effect on kidney.  Prior once every 2 weeks.  Only uses potassium when taking lasix .   Pl[facility] on [facility], other health maintenance declined.   Lab Results  Component Value Date   CHOL 207 (H) 08/17/2023   HDL 43 08/17/2023   LDLCALC 142 (H) 08/17/2023   TRIG 105 08/17/2023   CHOLHDL 4.8 08/17/2023   Lab Results  Component Value Date   ALT [name] 01/07/2024   AST 17 01/07/2024   ALKPHOS 89 01/07/2024   BILITOT 0.6 01/07/2024   Diabetes Insulin -dependent, type I, followed by Dr. Mercie.   Office visit Matthew [name].  Previously manages type 2 diabetes, but he did have significant elevated gad 65 and zinc transporter 8 antibodies consistent with having type 1 diabetes in April.  He was continued on Novolin N 15units with meals twice per day.  NovoLog  5 to 15 units with meals, 30-month follow-up planned. Happy with current care. Still some difficulty with control - has been discussing with Dr. Mercie. Currently 149 on cgm. 127 this am.   Anemia/hematology He is followed by oncology with normocytic anemia, office visit September 22.  Borderline low B12.  Recommended iron every other day, for mild iron deficiency.  Multivitamin with B12 or over-the-counter B12 supplement, increasing calcium  rich foods based on prior mild hypocalcemia, and increasing hydration for his creatinine.  67-month follow-up.  Elevated serum free light chain ratio but no evidence of M protein in urine or blood and no evidence of monoclonal gammopathy. Not taking iron yet - Rx cost prohibitive - plans on otc treatment with iron.  Has been trying to drink more water.   Lab Results  Component Value Date   CREATININE 1.33 (H) 01/07/2024   Lab Results  Component Value Date   VITAMINB12 358 01/07/2024   Lab Results  Component Value Date   WBC 5.9 01/07/2024   HGB 12.0 (L) 01/07/2024   HCT 35.6 (L) 01/07/2024   MCV 87.0 01/07/2024   PLT 205 01/07/2024    Positive PHQ.  Denies feeling depressed.  Decrease in activities d/t back issues.  Has been treated by Ortho.     01/21/2024   11:06 AM 01/20/2022    2:05 PM 11/03/2021    4:24 PM 07/08/2020   10:57 AM 02/08/2019   10:56 AM  Depression screen PHQ 2/9  Decreased Interest 2 0 0 0 0  Down, Depressed, Hopeless 0 0 0 0 0  PHQ - 2 Score 2 0 0 0 0  Altered sleeping 3      Tired, decreased energy 3      Change in appetite 1      Feeling bad or failure about yourself  0      Trouble concentrating 0      Moving slowly or fidgety/restless 3      Suicidal thoughts 0       PHQ-9 Score 12      Difficult doing work/chores Very difficult          History Patient Active Problem List   Diagnosis Date Noted   Abnormal EKG 01/20/2024   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) 01/20/2024   Anemia 01/17/2024   Iron deficiency anemia 07/01/2023   History of colonic polyps 07/01/2023   Coronary arteriosclerosis 07/01/2023   Heart valve disorder 07/01/2023   Tachycardia-bradycardia syndrome (HCC) 11/10/2022   Hypercoagulable state due to persistent atrial fibrillation (HCC) 11/06/2022   Lumbar pain 09/10/2022   Heart block AV complete (HCC) 06/03/2022   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   Severe aortic stenosis 04/13/2022   Aortic stenosis 03/27/2022   Nonrheumatic aortic valve stenosis 03/13/2022   Mitral stenosis 03/13/2022   Myopathy, unspecified 07/09/2020   Statin myopathy 09/15/2019   Atrial flutter (HCC) 10/31/2014   Coronary artery disease    Paroxysmal atrial flutter (HCC)    Type 1 diabetes mellitus with other specified complication (HCC)    Valvular heart disease    Atrial fibrillation (HCC) 01/25/2014   Diabetes mellitus, type 2 (HCC) 01/16/2014   Hyperlipidemia 01/16/2014   Erectile dysfunction associated with type 2 diabetes mellitus (HCC) 01/16/2014   Coronary atherosclerosis of native coronary artery 06/30/2013   Old myocardial infarction 06/30/2013   Pure hypercholesterolemia 06/30/2013   Past Medical History:  Diagnosis Date   Anemia    Arthritis    back, left ankle (11/20/2015)   Cataract    CHF (congestive heart failure) (HCC)    Chronic lower back pain    Coronary artery disease    a. 05/2009 CABG x 3: LIMA->LAD, VG->OM, VG->PDA; b. Nuc 01/2014: inf-lat scar but no ischemia, EF 57%.   Diabetic retinopathy (HCC)    mild- Dr. Cleatus   Glaucoma    Hyperlipidemia    a. Intolerant of lipitor and vytorin.   Lyme disease    Migraine    once or twice (11/20/2015)    Mitral stenosis    Mild to moderate with mean mitral valve gradient 4 mmHg by echo 06/2023   Myocardial infarction [facility])    'saw evidence of it on an EKG done in 2005   Paroxysmal atrial flutter (HCC)    a. 01/2014 s/p DCCV;  b. CHA2DS2VASc = 3-->Eliquis .   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 Verlin and Dr. 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 01/2014 Echo: Ef 60-65%, no rwma, mild AI/MS, mod MR, mildly dil LA.   Past Surgical History:  Procedure Laterality Date   ABDOMINAL EXPLORATION SURGERY  ~ 2005   for hernia, but didn't have one   ATRIAL FIBRILLATION ABLATION N/A 09/17/2016   Procedure: Atrial Fibrillation Ablation;  Surgeon: Inocencio Soyla Lunger, MD;  Location: [facility] INVASIVE CV LAB;  Service: Cardiovascular;  Laterality: N/A;   CARDIAC CATHETERIZATION  2011   CARDIAC VALVE REPLACEMENT     CARDIOVERSION N/A 02/19/2014   Procedure: CARDIOVERSION;  Surgeon: Victory LELON Claudene DOUGLAS, MD;  Location: [facility] ENDOSCOPY;  Service: Cardiovascular;  Laterality: N/A;   CARDIOVERSION N/A 10/28/2015   Procedure: CARDIOVERSION;  Surgeon: Aleene JINNY Passe, MD;  Location: [facility] ENDOSCOPY;  Service: Cardiovascular;  Laterality: N/A;   CARDIOVERSION  11/20/2015   200 joules   CARDIOVERSION N/A 07/02/2022   Procedure: CARDIOVERSION;  Surgeon: Lonni Slain, MD;  Location: [facility] ENDOSCOPY;  Service: Cardiovascular;  Laterality: N/A;   CARDIOVERSION N/A 11/10/2022   Procedure: CARDIOVERSION;  Surgeon: Francyne Headland, MD;  Location: [facility] INVASIVE CV LAB;  Service: Cardiovascular;  Laterality: N/A;   CORONARY ARTERY BYPASS GRAFT  05/2009   LIMA-LAD, SVG-OM, SVG-PDA 06/20/09   ELBOW FRACTURE SURGERY Left    broken ulna on left elbow-surgical repair    ELECTROPHYSIOLOGIC STUDY N/A 10/31/2014   Procedure: A-Flutter;  Surgeon: Danelle LELON Birmingham, MD;  Location: [facility] INVASIVE CV LAB;  Service: Cardiovascular;  Laterality: N/A;   ELECTROPHYSIOLOGIC STUDY N/A 11/20/2015   Procedure: A-Flutter Ablation;  Surgeon: Danelle LELON Birmingham, MD;  Location: [facility] INVASIVE CV LAB;  Service: Cardiovascular;  Laterality: N/A;   EYE SURGERY     FRACTURE SURGERY     INTRAOPERATIVE TRANSTHORACIC ECHOCARDIOGRAM N/A 06/02/2022   Procedure: INTRAOPERATIVE TRANSTHORACIC ECHOCARDIOGRAM;  Surgeon: Verlin Lonni BIRCH, MD;  Location: [facility] OR;  Service: Open Heart Surgery;  Laterality: N/A;   PACEMAKER IMPLANT N/A 06/03/2022   Procedure: PACEMAKER IMPLANT;  Surgeon: Inocencio Soyla Lunger, MD;  Location: [facility] INVASIVE CV LAB;  Service: Cardiovascular;  Laterality: N/A;   RIGHT/LEFT HEART CATH AND CORONARY/GRAFT ANGIOGRAPHY N/A 04/13/2022   Procedure: RIGHT/LEFT HEART CATH AND CORONARY/GRAFT ANGIOGRAPHY;  Surgeon: Verlin Lonni BIRCH, MD;  Location: [facility] INVASIVE CV LAB;  Service: Cardiovascular;  Laterality: N/A;   TEE WITHOUT CARDIOVERSION N/A 03/13/2022   Procedure: TRANSESOPHAGEAL ECHOCARDIOGRAM (TEE);  Surgeon: Francyne Headland, MD;  Location: [facility] ENDOSCOPY;  Service: Cardiovascular;  Laterality: N/A;   TESTICLE REMOVAL Right 1982   TONSILLECTOMY  ~ 1967   TRANSCATHETER AORTIC VALVE REPLACEMENT, TRANSFEMORAL N/A 06/02/2022   Procedure: Transcatheter Aortic Valve Replacement, Transfemoral;  Surgeon: Verlin Lonni BIRCH, MD;  Location: [facility] OR;  Service: Open Heart Surgery;  Laterality: N/A;   Allergies  Allergen Reactions   Bee Venom Swelling and Other (See Comments)    honey bee venom   Codeine Hives, Nausea Only and Rash    codeine   Pioglitazone Swelling and Other (See Comments)    pioglitazone   Metformin Nausea Only   Statins Other (See Comments)    Muscle pain, fatigue   Raspberry Itching and Hives   Prior to Admission medications   Medication Sig Start Date End  Date Taking? Authorizing Provider  apixaban  (ELIQUIS ) 5 MG TABS tablet Take 1 tablet (5 mg total) by mouth 2 (two) times daily. 07/19/23  Yes Sebastian Lamarr SAUNDERS, PA-C  Continuous Glucose Sensor (DEXCOM G7 SENSOR)  MISC Change sensor every 10 days as directed by manufacturer 12/21/23  Yes Thapa, [place], MD  Continuous Glucose Sensor (DEXCOM G7 SENSOR) MISC Use to check glucose continuously, change sensor every 10 days 12/21/23  Yes Thapa, [place], MD  cyanocobalamin  (VITAMIN B12) 1000 MCG tablet Take 1,000 mcg by mouth daily.   Yes [provider]  dorzolamide -timolol  (COSOPT ) 22.3-6.8 MG/ML ophthalmic solution Place 1 drop into the left eye 2 (two) times daily. 10/01/21  Yes [provider]  ferrous sulfate  325 (65 FE) MG EC tablet Take 1 tablet (325 mg total) by mouth daily with breakfast. 01/17/24  Yes Heilingoetter, Cassandra L, PA-C  furosemide  (LASIX ) 40 MG tablet Take 40 mg by mouth as needed for fluid or edema.   Yes [provider]  insulin  aspart (NOVOLOG ) 100 UNIT/ML injection Inject 5-15 Units into the skin 3 (three) times daily with meals. Maximum 45 units / day. 8/[name]/25  Yes Thapa, [place], MD  insulin  NPH Human (NOVOLIN N) 100 UNIT/ML injection Inject 0.15 mLs (15 Units total) into the skin 2 (two) times daily before a meal. 8/[name]/25  Yes Thapa, [place], MD  Insulin  Syringe-Needle U-100 (BD VEO INSULIN  SYRINGE U/F) 31G X 15/64 0.5 ML MISC Use to inject 4 times a day 01/03/24  Yes Thapa, [place], MD  lisinopril  (ZESTRIL ) 5 MG tablet TAKE 1 TABLET BY MOUTH EVERY DAY 11/26/23  Yes Camnitz, Will Gladis, MD  metoprolol  succinate (TOPROL -XL) 100 MG 24 hr tablet TAKE 1 TABLET (100 MG TOTAL) BY MOUTH DAILY. TAKE WITH OR IMMEDIATELY FOLLOWING A MEAL. PLEASE CALL [phone] TO SCHEDULE AN OVERDUE APPOINTMENT FOR FUTURE REFILLS. THANK YOU. 1ST ATTEMPT. 01/06/24 01/05/25 Yes Camnitz, Soyla Gladis, MD  potassium chloride  (KLOR-CON ) 10 MEQ tablet TAKE 1 TABLET BY MOUTH EVERY DAY 04/23/23  Yes  Levora Matthew SAUNDERS, MD   Social History   Socioeconomic History   Marital status: Married    Spouse name: Not on file   Number of children: 0   Years of education: Not on file   Highest education level: Bachelor's degree (e.g., BA, AB, BS)  Occupational History   Occupation: Insurance account manager  Tobacco Use   Smoking status: Former    Current packs/day: 0.00    Average packs/day: 0.5 packs/day for 15.0 years (7.5 ttl pk-yrs)    Types: Cigarettes    Start date: 70    Quit date: 1990    Years since quitting: 35.7   Smokeless tobacco: Never   Tobacco comments:    quit smoking cigarettes in the 1980's; don't know how much or for how long  Vaping Use   Vaping status: Never Used  Substance and Sexual Activity   Alcohol use: Yes    Alcohol/week: 3.0 standard drinks of alcohol    Types: 3 Cans of beer per week   Drug use: No    Comment: quit smoking pot in the 1990s   Sexual activity: Not Currently    Birth control/protection: Coitus interruptus  Other Topics Concern   Not on file  Social History Narrative   Not on file   Social Drivers of Health   Financial Resource Strain: Patient Declined (06/30/2023)   Overall Financial Resource Strain (CARDIA)    Difficulty of Paying Living Expenses: Patient declined  Food Insecurity: Patient Declined (06/30/2023)   Hunger Vital Sign    Worried About Running Out of Food in the Last Year: Patient declined    Ran Out of Food in the Last Year: Patient declined  Transportation Needs: No  Transportation Needs (06/30/2023)   PRAPARE - Administrator, Civil Service (Medical): No    Lack of Transportation (Non-Medical): No  Physical Activity: Unknown (06/30/2023)   Exercise Vital Sign    Days of Exercise per Week: 0 days    Minutes of Exercise per Session: Not on file  Stress: No Stress Concern Present (06/30/2023)   [employer] of Occupational Health - Occupational Stress Questionnaire    Feeling of Stress : Not at all  Social  Connections: Unknown (06/30/2023)   Social Connection and Isolation Panel    Frequency of Communication with Friends and Family: More than three times a week    Frequency of Social Gatherings with Friends and Family: Three times a week    Attends Religious Services: Patient declined    Active Member of Clubs or Organizations: No    Attends Banker Meetings: Not on file    Marital Status: Married  Intimate Partner Violence: Not At Risk (03/28/2022)   Humiliation, Afraid, Rape, and Kick questionnaire    Fear of Current or Ex-Partner: No    Emotionally Abused: No    Physically Abused: No    Sexually Abused: No    Review of Systemsper HPI.   Objective:   Vitals:   01/21/24 1102  BP: 128/60  Pulse: 60  Resp: 19  Temp: 98.4 F (36.9 C)  TempSrc: Temporal  SpO2: 99%  Weight: 197 lb (89.4 kg)  Height: 5' 6 (1.676 m)     Physical Exam Vitals reviewed.  Constitutional:      Appearance: He is well-developed.  HENT:     Head: Normocephalic and atraumatic.  Neck:     Vascular: No carotid bruit or JVD.  Cardiovascular:     Rate and Rhythm: Normal rate and regular rhythm.     Heart sounds: Normal heart sounds. No murmur heard. Pulmonary:     Effort: Pulmonary effort is normal.     Breath sounds: Normal breath sounds. No rales.  Musculoskeletal:     Right lower leg: Edema (2+ pitting to mid to upper tibia bilaterally.  No stasis changes.) present.     Left lower leg: Edema present.  Skin:    General: Skin is warm and dry.  Neurological:     Mental Status: He is alert and oriented to person, place, and time.  Psychiatric:        Mood and Affect: Mood normal.        Assessment & Plan:  Matthew [name] is a 78 y.o. male . Congestive heart failure, unspecified HF chronicity, unspecified heart failure type (HCC) - Plan: Basic metabolic panel with GFR, furosemide  (LASIX ) 40 MG tablet, potassium chloride  (KLOR-CON ) 10 MEQ tablet  - Pedal edema noted on exam,  recommended trial of low-dose furosemide  with potassium to check for improvement, and can have BMP performed as a lab visit within a few days given concern of renal function with use of furosemide .  RTC precautions, continue follow-up with cardiology.  Atrial fibrillation, unspecified type (HCC) S/P TAVR (transcatheter aortic valve replacement)  - Regular ongoing care of electrophysiology, cardiology as above.  Tolerating current med regimen.  No changes.  Normocytic anemia  - Plan for B12 and iron supplementation as above, he declines follow-up with hematology at this time.  Potentially could recheck his labs at his next visit with me but depending on his readings may need hematology assistance.  He may request a new referral or a second opinion.  Hyperlipidemia, unspecified hyperlipidemia type  - With cardiac risk factors as above but he has declined statins for treatment declines repeat testing as he would not change plan based on those results.  Elevated serum creatinine - Plan: Basic metabolic panel with GFR  - Check labs after furosemide  as above and adjust plan accordingly.  Meds ordered this encounter  Medications   furosemide  (LASIX ) 40 MG tablet    Sig: Take 1 tablet (40 mg total) by mouth as needed for fluid or edema.    Dispense:  30 tablet    Refill:  0   potassium chloride  (KLOR-CON ) 10 MEQ tablet    Sig: Take 1 tablet (10 mEq total) by mouth daily.    Dispense:  30 tablet    Refill:  0   Patient Instructions  Thanks for coming in today.  Based on the swelling of the legs, I think I would try the furosemide  either half or full pill with a potassium supplement to see if that helps with the swelling.  I do understand your concern with elevated kidney function test and I have entered a lab order for a BMP blood test to recheck the creatinine within a few days of taking furosemide  if you would like.  Please call our office and schedule a lab only visit for that test.  Iron  supplement over-the-counter every few days is reasonable along with B12 supplement and we can recheck those levels at your next visit if you have not followed up with hematology.  Keep follow-up with endocrinology as planned, and follow-up with cardiology as planned.  Take care!      Signed,   Matthew Pines, MD  Primary Care, [facility] 01/21/24 11:17 AM

## 2024-01-21 NOTE — Patient Instructions (Addendum)
 Thanks for coming in today.  Based on the swelling of the legs, I think I would try the furosemide  either half or full pill with a potassium supplement to see if that helps with the swelling.  I do understand your concern with elevated kidney function test and I have entered a lab order for a BMP blood test to recheck the creatinine within a few days of taking furosemide  if you would like.  Please call our office and schedule a lab only visit for that test.  Iron supplement over-the-counter every few days is reasonable along with B12 supplement and we can recheck those levels at your next visit if you have not followed up with hematology.  Keep follow-up with endocrinology as planned, and follow-up with cardiology as planned.  Take care!

## 2024-01-25 ENCOUNTER — Other Ambulatory Visit: Payer: Self-pay | Admitting: Cardiology

## 2024-02-25 ENCOUNTER — Telehealth: Payer: Self-pay | Admitting: Cardiology

## 2024-02-25 ENCOUNTER — Other Ambulatory Visit: Payer: Self-pay | Admitting: Family Medicine

## 2024-02-25 DIAGNOSIS — I509 Heart failure, unspecified: Secondary | ICD-10-CM

## 2024-02-25 MED ORDER — APIXABAN 5 MG PO TABS: 5.0000 mg | ORAL_TABLET | Freq: Two times a day (BID) | ORAL | 1 refills | Status: AC

## 2024-02-25 NOTE — Telephone Encounter (Signed)
 Prescription refill request for Eliquis  received. Indication:  A Flutter Last office visit: 07/09/23 Scr: 1.33 (01/07/24 EPIC) Age:  78 Weight: 89.4 kg  Per protocol okay to refill. SABRA

## 2024-02-25 NOTE — Telephone Encounter (Signed)
*  STAT* If patient is at the pharmacy, call can be transferred to refill team.   1. Which medications need to be refilled? (please list name of each medication and dose if known)   apixaban  (ELIQUIS ) 5 MG TABS tablet     2. Would you like to learn more about the convenience, safety, & potential cost savings by using the [facility]? No   3. Are you open to using the [facility] (Type [facility].) No   4. Which pharmacy/location (including street and city if local pharmacy) is medication to be sent to?  [id number] - [place], [place] - [address]   5. Do they need a 30 day or 90 day supply? 30 day  Pt has been out of medication for 2 days

## 2024-02-25 NOTE — Telephone Encounter (Signed)
 Okay to change to 90D?

## 2024-02-28 DIAGNOSIS — H35352 Cystoid macular degeneration, left eye: Secondary | ICD-10-CM | POA: Diagnosis not present

## 2024-02-28 DIAGNOSIS — H3412 Central retinal artery occlusion, left eye: Secondary | ICD-10-CM | POA: Diagnosis not present

## 2024-02-28 DIAGNOSIS — Z794 Long term (current) use of insulin: Secondary | ICD-10-CM | POA: Diagnosis not present

## 2024-02-28 DIAGNOSIS — E113511 Type 2 diabetes mellitus with proliferative diabetic retinopathy with macular edema, right eye: Secondary | ICD-10-CM | POA: Diagnosis not present

## 2024-02-28 DIAGNOSIS — E113512 Type 2 diabetes mellitus with proliferative diabetic retinopathy with macular edema, left eye: Secondary | ICD-10-CM | POA: Diagnosis not present

## 2024-02-28 DIAGNOSIS — H35342 Macular cyst, hole, or pseudohole, left eye: Secondary | ICD-10-CM | POA: Diagnosis not present

## 2024-02-28 DIAGNOSIS — H401131 Primary open-angle glaucoma, bilateral, mild stage: Secondary | ICD-10-CM | POA: Diagnosis not present

## 2024-02-28 DIAGNOSIS — H211X2 Other vascular disorders of iris and ciliary body, left eye: Secondary | ICD-10-CM | POA: Diagnosis not present

## 2024-02-28 DIAGNOSIS — H35372 Puckering of macula, left eye: Secondary | ICD-10-CM | POA: Diagnosis not present

## 2024-03-03 ENCOUNTER — Ambulatory Visit: Attending: Student | Admitting: Student

## 2024-03-03 ENCOUNTER — Encounter: Payer: Self-pay | Admitting: Student

## 2024-03-03 VITALS — BP 130/68 | HR 87 | Ht 66.0 in | Wt 202.0 lb

## 2024-03-03 DIAGNOSIS — I442 Atrioventricular block, complete: Secondary | ICD-10-CM

## 2024-03-03 DIAGNOSIS — I2510 Atherosclerotic heart disease of native coronary artery without angina pectoris: Secondary | ICD-10-CM

## 2024-03-03 DIAGNOSIS — I4891 Unspecified atrial fibrillation: Secondary | ICD-10-CM

## 2024-03-03 DIAGNOSIS — Z950 Presence of cardiac pacemaker: Secondary | ICD-10-CM

## 2024-03-03 DIAGNOSIS — Z952 Presence of prosthetic heart valve: Secondary | ICD-10-CM

## 2024-03-03 DIAGNOSIS — I5032 Chronic diastolic (congestive) heart failure: Secondary | ICD-10-CM

## 2024-03-03 LAB — CUP PACEART INCLINIC DEVICE CHECK
Battery Remaining Longevity: 122 mo
Battery Voltage: 3.01 V
Brady Statistic RA Percent Paced: 15 %
Brady Statistic RV Percent Paced: 1.2 %
Date Time Interrogation Session: 20251107111239
Implantable Lead Connection Status: 753985
Implantable Lead Connection Status: 753985
Implantable Lead Implant Date: 20240207
Implantable Lead Implant Date: 20240207
Implantable Lead Location: 753859
Implantable Lead Location: 753860
Implantable Pulse Generator Implant Date: 20240207
Lead Channel Impedance Value: 400 Ohm
Lead Channel Impedance Value: 550 Ohm
Lead Channel Pacing Threshold Amplitude: 0.75 V
Lead Channel Pacing Threshold Amplitude: 0.75 V
Lead Channel Pacing Threshold Amplitude: 0.75 V
Lead Channel Pacing Threshold Amplitude: 0.75 V
Lead Channel Pacing Threshold Pulse Width: 0.5 ms
Lead Channel Pacing Threshold Pulse Width: 0.5 ms
Lead Channel Pacing Threshold Pulse Width: 0.5 ms
Lead Channel Pacing Threshold Pulse Width: 0.5 ms
Lead Channel Sensing Intrinsic Amplitude: 12 mV
Lead Channel Sensing Intrinsic Amplitude: 2.1 mV
Lead Channel Setting Pacing Amplitude: 2 V
Lead Channel Setting Pacing Amplitude: 2.5 V
Lead Channel Setting Pacing Pulse Width: 0.5 ms
Lead Channel Setting Sensing Sensitivity: 2 mV
Pulse Gen Model: 2272
Pulse Gen Serial Number: 8154174

## 2024-03-03 NOTE — Patient Instructions (Signed)
 Medication Instructions:  No medication changes today. *If you need a refill on your cardiac medications before your next appointment, please call your pharmacy*  Lab Work: No labwork ordered today. If you have labs (blood work) drawn today and your tests are completely normal, you will receive your results only by: MyChart Message (if you have MyChart) OR A paper copy in the mail If you have any lab test that is abnormal or we need to change your treatment, we will call you to review the results.  Testing/Procedures: No testing ordered today  Follow-Up: At [facility], you and your health needs are our priority.  As part of our continuing mission to provide you with exceptional heart care, our providers are all part of one team.  This team includes your primary Cardiologist (physician) and Advanced Practice Providers or APPs (Physician Assistants and Nurse Practitioners) who all work together to provide you with the care you need, when you need it.  Your next appointment:   12 month(s)  Provider:   You may see Will Cortland Ding, MD or one of the following Advanced Practice Providers on your designated Care Team:   Mertha Abrahams, [place] [address] [place], PA-C Suzann Riddle, NP Creighton Doffing, NP    We recommend signing up for the patient portal called "MyChart".  Sign up information is provided on this After Visit Summary.  MyChart is used to connect with patients for Virtual Visits (Telemedicine).  Patients are able to view lab/test results, encounter notes, upcoming appointments, etc.  Non-urgent messages can be sent to your provider as well.   To learn more about what you can do with MyChart, go to [email or web address].

## 2024-03-03 NOTE — Progress Notes (Signed)
  Electrophysiology Office Note:   ID:  Delfino L Mathey [name], DOB 1946-04-06, MRN [id number]  Primary Cardiologist: Will Gladis Norton, MD Electrophysiologist: Soyla Gladis Norton, MD      History of Present Illness:   Matthew [name] is a 78 y.o. male with h/o CHB s/p PPM, persistent AF/AFL, AS s/p TAVR, and HFrecEF seen today for routine electrophysiology followup.   Since last being seen in our clinic the patient reports doing well overall. He denies chest pain, palpitations, dyspnea, PND, orthopnea, nausea, vomiting, dizziness, syncope, edema, weight gain, or early satiety.   Review of systems complete and found to be negative unless listed in HPI.   EP Information / Studies Reviewed:    EKG is ordered today. Personal review as below.  EKG Interpretation Date/Time:  Friday March 03 2024 10:56:12 EST Ventricular Rate:  87 PR Interval:  232 QRS Duration:  146 QT Interval:  426 QTC Calculation: 512 R Axis:   126  Text Interpretation: Sinus rhythm with 1st degree A-V block Right bundle branch block Left posterior fascicular block Bifascicular block Confirmed by Lesia Sharper [phone]) on 03/03/2024 10:59:55 AM    PPM Interrogation-  reviewed in detail today,  See PACEART report.  Arrhythmia/Device History Abbott Dual Chamber PPM 05/2022 for intermittent CHB   S/p AFL ablation 2016 S/p PVI 2018   Physical Exam:   VS:  BP 130/68   Pulse 87   Ht 5' 6 (1.676 m)   Wt 202 lb (91.6 kg)   SpO2 96%   BMI 32.60 kg/m    Wt Readings from Last 3 Encounters:  03/03/24 202 lb (91.6 kg)  01/21/24 197 lb (89.4 kg)  01/04/24 192 lb (87.1 kg)     GEN: No acute distress  NECK: No JVD; No carotid bruits CARDIAC: Regular rate and rhythm, no murmurs, rubs, gallops RESPIRATORY:  Clear to auscultation without rales, wheezing or rhonchi  ABDOMEN: Soft, non-tender, non-distended EXTREMITIES:  No edema; No deformity   ASSESSMENT AND PLAN:    CHB s/p Abbott PPM  Normal PPM  function See Pace Art report No changes today  Persistent AF Paroxysmal AFL Secondary hypercoagulable state S/p CTI 2016 and PVI 2018 EKG today shows NSR Continue toprol  50 mg BID Continue eliquis  5 mg BID for CHA2DS2VASc of at least 4  CAD No s/s of ischemia.      Valvular heart disease  S/p TAVR Followed by Dr. Verlin  Chronic systolic and diastolic CHF Echo 06/2023 with normalized EF and grade 2 DD Volume status stable on exam  Disposition:   Follow up with EP Team in 12 months  Signed, Sharper Prentice Lesia, PA-C

## 2024-03-06 ENCOUNTER — Ambulatory Visit: Admitting: Endocrinology

## 2024-03-06 ENCOUNTER — Ambulatory Visit: Payer: Self-pay | Admitting: Endocrinology

## 2024-03-06 ENCOUNTER — Encounter: Payer: Self-pay | Admitting: Endocrinology

## 2024-03-06 ENCOUNTER — Encounter: Payer: Self-pay | Admitting: Pharmacist

## 2024-03-06 VITALS — BP 154/80 | HR 78 | Resp 20 | Ht 66.0 in | Wt 195.4 lb

## 2024-03-06 DIAGNOSIS — E1065 Type 1 diabetes mellitus with hyperglycemia: Secondary | ICD-10-CM

## 2024-03-06 LAB — POCT GLYCOSYLATED HEMOGLOBIN (HGB A1C): Hemoglobin A1C: 7.7 % — AB (ref 4.0–5.6)

## 2024-03-06 NOTE — Progress Notes (Signed)
 Outpatient Endocrinology Note Dula Havlik, MD  03/06/24  Patient's Name: Matthew [name]    DOB: 1945-12-24    MRN: [id number]                                                    REASON OF VISIT: Follow up for type 1 diabetes mellitus  PCP: Levora Reyes SAUNDERS, MD  HISTORY OF PRESENT ILLNESS:   Soloman L Goh [name] is a 78 y.o. old male with past medical history listed below, is here for follow up of type 1 diabetes mellitus.   Pertinent Diabetes History: Patient was diagnosed with diabetes mellitus in 1992, managed as type 2 diabetes mellitus as of April 2025.  He was initially treated with Glucophage.  He was treated with premixed insulin  for several years 15+ years.  In April 2025 he had significantly elevated GAD 65 and ZnT8 Ab autoantibodies, consistent with type 1 diabetes mellitus.   Latest Reference Range & Units 08/17/23 09:49  ZNT8 Antibodies <15 U/mL 321 (H)  Glutamic Acid Decarb Ab <5 IU/mL >250 (H)  C-Peptide 0.80 - 3.85 ng/mL 0.64 (L)  (H): Data is abnormally high (L): Data is abnormally low  He has type 1 diabetes mellitus.  Chronic Diabetes Complications : Retinopathy: yes, following with ophthalmology , reportedly. Retinal detachment, following with Dr. Loretha Nephropathy: CKD, on lisinopril , history of microalbuminuria. Peripheral neuropathy: no Coronary artery disease: yes, s/p CABG Stroke: no  Relevant comorbidities and cardiovascular risk factors: Obesity: yes Body mass index is 31.54 kg/m.  Hypertension: yes Hyperlipidemia. Yes,  Lipids: He was previously on Vytorin and Lipitor. He thinks he had myalgias and weakness when taking these drugs. He has been recommended statin drugs in the lipid clinic but he refuses to take them stating that they will not help him. Also has significantly increased LDL particle number. He was prescribed Praluent  at the cardiology clinic but he refused this. Has hypercholesterolemia and history of coronary artery bypass  surgery. He was also suggested Leqvio previously and he refused  Current / Home Diabetic regimen includes:  Novolin N 10 units with BF and 10 units at supper, he sometimes adjust dose of Novolin N. Novolog  5-15 units with meals three times a day plus sliding scale.  He sometimes ? correct for hyperglycemia at bedtime with NovoLog .  Takes after eating.  He prefers to stay on NPH insulin  instead of basal insulin  like Lantus .  He thinks NPH works better for him.  Prior diabetic medications: Glucophage/metformin, ?  Nausea/malaise. Actos, edema. Farxiga  was suggested in the past-never started due to concern of side effects.  Glycemic data:    CONTINUOUS GLUCOSE MONITORING SYSTEM (CGMS) INTERPRETATION: At today's visit, we reviewed CGM downloads. The full report is scanned in the media. Reviewing the CGM trends, blood glucose are as follows:  Dexcom G7 CGM-  Sensor Download (Sensor download was reviewed and summarized below.) Dates: October 28 to November 10 , 2025, 14 days  Glucose Management Indicator: 7.9%    Previous :   Interpretation: Mostly hyperglycemia after breakfast with blood sugar up to 300-350 range, blood sugar in the afternoon and evening mostly acceptable and variable.  Blood sugar overnight mostly in the lower normal range acceptable with occasional mild hypoglycemia.  Hypoglycemia: Patient has some hypoglycemic episodes. Patient has hypoglycemia awareness.  Factors modifying glucose control:  1.  Diabetic diet assessment: 2-3 meals a day.  2.  Staying active or exercising: Limited physical activity decreased exercise,  Significantly.  3.  Medication compliance: compliant most of the time.  Interval history  CGM data as reviewed above.  Diabetes regimen is reviewed and noted above.  He takes NovoLog  after eating, has variable blood sugar especially with breakfast.  Hemoglobin A1c 7.7%.  No other complaints today.  REVIEW OF SYSTEMS As per history of present  illness.   PAST MEDICAL HISTORY: Past Medical History:  Diagnosis Date   Anemia    Arthritis    back, left ankle (11/20/2015)   Cataract    CHF (congestive heart failure) (HCC)    Chronic lower back pain    Coronary artery disease    a. 05/2009 CABG x 3: LIMA->LAD, VG->OM, VG->PDA; b. Nuc 01/2014: inf-lat scar but no ischemia, EF 57%.   Diabetic retinopathy (HCC)    mild- Dr. Cleatus   Glaucoma    Hyperlipidemia    a. Intolerant of lipitor and vytorin.   Lyme disease    Migraine    once or twice (11/20/2015)   Mitral stenosis    Mild to moderate with mean mitral valve gradient 4 mmHg by echo 06/2023   Myocardial infarction [facility])    'saw evidence of it on an EKG done in 2005   Paroxysmal atrial flutter (HCC)    a. 01/2014 s/p DCCV;  b. CHA2DS2VASc = 3-->Eliquis .   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 Verlin and Dr. 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 01/2014 Echo: Ef 60-65%, no rwma, mild AI/MS, mod MR, mildly dil LA.    PAST SURGICAL HISTORY: Past Surgical History:  Procedure Laterality Date   ABDOMINAL EXPLORATION SURGERY  ~ 2005   for hernia, but didn't have one   ATRIAL FIBRILLATION ABLATION N/A 09/17/2016   Procedure: Atrial Fibrillation Ablation;  Surgeon: Inocencio Soyla Lunger, MD;  Location: [facility] INVASIVE CV LAB;  Service: Cardiovascular;  Laterality: N/A;   CARDIAC CATHETERIZATION  2011   CARDIAC VALVE REPLACEMENT     CARDIOVERSION N/A 02/19/2014   Procedure: CARDIOVERSION;  Surgeon: Victory LELON Claudene DOUGLAS, MD;  Location: [facility] ENDOSCOPY;  Service: Cardiovascular;  Laterality: N/A;   CARDIOVERSION N/A 10/28/2015   Procedure: CARDIOVERSION;  Surgeon: Aleene JINNY Passe, MD;  Location: [facility] ENDOSCOPY;  Service: Cardiovascular;   Laterality: N/A;   CARDIOVERSION  11/20/2015   200 joules   CARDIOVERSION N/A 07/02/2022   Procedure: CARDIOVERSION;  Surgeon: Lonni Slain, MD;  Location: [facility] ENDOSCOPY;  Service: Cardiovascular;  Laterality: N/A;   CARDIOVERSION N/A 11/10/2022   Procedure: CARDIOVERSION;  Surgeon: Francyne Headland, MD;  Location: [facility] INVASIVE CV LAB;  Service: Cardiovascular;  Laterality: N/A;   CORONARY ARTERY BYPASS GRAFT  05/2009   LIMA-LAD, SVG-OM, SVG-PDA 06/20/09   ELBOW FRACTURE SURGERY Left    broken ulna on left elbow-surgical repair   ELECTROPHYSIOLOGIC STUDY N/A 10/31/2014   Procedure: A-Flutter;  Surgeon: Danelle LELON Birmingham, MD;  Location: [facility] INVASIVE CV LAB;  Service: Cardiovascular;  Laterality: N/A;   ELECTROPHYSIOLOGIC STUDY N/A 11/20/2015   Procedure: A-Flutter Ablation;  Surgeon: Danelle LELON Birmingham, MD;  Location: [facility] INVASIVE  CV LAB;  Service: Cardiovascular;  Laterality: N/A;   EYE SURGERY     FRACTURE SURGERY     INTRAOPERATIVE TRANSTHORACIC ECHOCARDIOGRAM N/A 06/02/2022   Procedure: INTRAOPERATIVE TRANSTHORACIC ECHOCARDIOGRAM;  Surgeon: Verlin Lonni BIRCH, MD;  Location: [facility] OR;  Service: Open Heart Surgery;  Laterality: N/A;   PACEMAKER IMPLANT N/A 06/03/2022   Procedure: PACEMAKER IMPLANT;  Surgeon: Inocencio Soyla Lunger, MD;  Location: [facility] INVASIVE CV LAB;  Service: Cardiovascular;  Laterality: N/A;   RIGHT/LEFT HEART CATH AND CORONARY/GRAFT ANGIOGRAPHY N/A 04/13/2022   Procedure: RIGHT/LEFT HEART CATH AND CORONARY/GRAFT ANGIOGRAPHY;  Surgeon: Verlin Lonni BIRCH, MD;  Location: [facility] INVASIVE CV LAB;  Service: Cardiovascular;  Laterality: N/A;   TEE WITHOUT CARDIOVERSION N/A 03/13/2022   Procedure: TRANSESOPHAGEAL ECHOCARDIOGRAM (TEE);  Surgeon: Francyne Headland, MD;  Location: [facility] ENDOSCOPY;  Service: Cardiovascular;  Laterality: N/A;   TESTICLE REMOVAL Right 1982   TONSILLECTOMY  ~ 1967   TRANSCATHETER AORTIC VALVE REPLACEMENT, TRANSFEMORAL N/A 06/02/2022   Procedure:  Transcatheter Aortic Valve Replacement, Transfemoral;  Surgeon: Verlin Lonni BIRCH, MD;  Location: [facility] OR;  Service: Open Heart Surgery;  Laterality: N/A;    ALLERGIES: Allergies  Allergen Reactions   Bee Venom Swelling and Other (See Comments)    honey bee venom   Codeine Hives, Nausea Only and Rash    codeine   Pioglitazone Swelling and Other (See Comments)    pioglitazone   Metformin Nausea Only   Statins Other (See Comments)    Muscle pain, fatigue   Raspberry Itching and Hives    FAMILY HISTORY:  Family History  Problem Relation Age of Onset   Diabetes Mother    Heart disease Mother        Heart murmur   Heart disease Father    Heart attack Father    Diabetes Father    Hypertension Father    Diabetes Sister     SOCIAL HISTORY: Social History   Socioeconomic History   Marital status: Married    Spouse name: Not on file   Number of children: 0   Years of education: Not on file   Highest education level: Bachelor's degree (e.g., BA, AB, BS)  Occupational History   Occupation: Insurance account manager  Tobacco Use   Smoking status: Former    Current packs/day: 0.00    Average packs/day: 0.5 packs/day for 15.0 years (7.5 ttl pk-yrs)    Types: Cigarettes    Start date: 57    Quit date: 1990    Years since quitting: 35.8   Smokeless tobacco: Never   Tobacco comments:    quit smoking cigarettes in the 1980's; don't know how much or for how long  Vaping Use   Vaping status: Never Used  Substance and Sexual Activity   Alcohol use: Yes    Alcohol/week: 3.0 standard drinks of alcohol    Types: 3 Cans of beer per week   Drug use: No    Comment: quit smoking pot in the 1990s   Sexual activity: Not Currently    Birth control/protection: Coitus interruptus  Other Topics Concern   Not on file  Social History Narrative   Not on file   Social Drivers of Health   Financial Resource Strain: Patient Declined (06/30/2023)   Overall Financial Resource Strain  (CARDIA)    Difficulty of Paying Living Expenses: Patient declined  Food Insecurity: Patient Declined (06/30/2023)   Hunger Vital Sign    Worried About Running Out of Food in the Last Year: Patient declined  Ran Out of Food in the Last Year: Patient declined  Transportation Needs: No Transportation Needs (06/30/2023)   PRAPARE - Administrator, Civil Service (Medical): No    Lack of Transportation (Non-Medical): No  Physical Activity: Unknown (06/30/2023)   Exercise Vital Sign    Days of Exercise per Week: 0 days    Minutes of Exercise per Session: Not on file  Stress: No Stress Concern Present (06/30/2023)   [employer] of Occupational Health - Occupational Stress Questionnaire    Feeling of Stress : Not at all  Social Connections: Unknown (06/30/2023)   Social Connection and Isolation Panel    Frequency of Communication with Friends and Family: More than three times a week    Frequency of Social Gatherings with Friends and Family: Three times a week    Attends Religious Services: Patient declined    Active Member of Clubs or Organizations: No    Attends Engineer, Structural: Not on file    Marital Status: Married    MEDICATIONS:  Current Outpatient Medications  Medication Sig Dispense Refill   apixaban  (ELIQUIS ) 5 MG TABS tablet Take 1 tablet (5 mg total) by mouth 2 (two) times daily. 180 tablet 1   Continuous Glucose Sensor (DEXCOM G7 SENSOR) MISC Change sensor every 10 days as directed by manufacturer 9 each 1   Continuous Glucose Sensor (DEXCOM G7 SENSOR) MISC Use to check glucose continuously, change sensor every 10 days     cyanocobalamin  (VITAMIN B12) 1000 MCG tablet Take 1,000 mcg by mouth daily.     dorzolamide -timolol  (COSOPT ) 22.3-6.8 MG/ML ophthalmic solution Place 1 drop into the left eye 2 (two) times daily.     ferrous sulfate  325 (65 FE) MG EC tablet Take 1 tablet (325 mg total) by mouth daily with breakfast. 30 tablet 3   furosemide  (LASIX ) 40  MG tablet TAKE 1 TABLET (40 MG TOTAL) BY MOUTH AS NEEDED FOR FLUID OR EDEMA. 90 tablet 1   insulin  aspart (NOVOLOG ) 100 UNIT/ML injection Inject 5-15 Units into the skin 3 (three) times daily with meals. Maximum 45 units / day. 30 mL 3   insulin  NPH Human (NOVOLIN N) 100 UNIT/ML injection Inject 0.15 mLs (15 Units total) into the skin 2 (two) times daily before a meal. 20 mL 3   Insulin  Syringe-Needle U-100 (BD VEO INSULIN  SYRINGE U/F) 31G X 15/64 0.5 ML MISC Use to inject 4 times a day 400 each 3   lisinopril  (ZESTRIL ) 5 MG tablet TAKE 1 TABLET BY MOUTH EVERY DAY 90 tablet 2   metoprolol  succinate (TOPROL -XL) 100 MG 24 hr tablet TAKE 1 TABLET BY MOUTH DAILY. TAKE WITH OR IMMEDIATELY FOLLOWING A MEAL. PLEASE TO SCHEDULE AN APPT 90 tablet 1   potassium chloride  (KLOR-CON ) 10 MEQ tablet Take 1 tablet (10 mEq total) by mouth daily. 30 tablet 0   No current facility-administered medications for this visit.    PHYSICAL EXAM: Vitals:   03/06/24 1035 03/06/24 1036  BP: (!) 152/80 (!) 154/80  Pulse: 78   Resp: 20   SpO2: 96%   Weight: 195 lb 6.4 oz (88.6 kg)   Height: 5' 6 (1.676 m)      Body mass index is 31.54 kg/m.  Wt Readings from Last 3 Encounters:  03/06/24 195 lb 6.4 oz (88.6 kg)  03/03/24 202 lb (91.6 kg)  01/21/24 197 lb (89.4 kg)    General: Well developed, well nourished male in no apparent distress.  HEENT: AT/[place], no external  lesions.  Eyes: Conjunctiva clear and no icterus. Neck: Neck supple  Lungs: Respirations not labored Neurologic: Alert, oriented, normal speech Extremities / Skin: Dry.  Psychiatric: Does not appear depressed or anxious  Diabetic Foot Exam - Simple   No data filed    LABS Reviewed Lab Results  Component Value Date   HGBA1C 7.7 (A) 03/06/2024   HGBA1C 7.6 (A) 12/08/2023   HGBA1C 8.6 (H) 08/17/2023   Lab Results  Component Value Date   FRUCTOSAMINE 337 (H) 10/11/2019   FRUCTOSAMINE 321 (H) 03/07/2018   FRUCTOSAMINE 381 (H) 12/10/2015    Lab Results  Component Value Date   CHOL 207 (H) 08/17/2023   HDL 43 08/17/2023   LDLCALC 142 (H) 08/17/2023   TRIG 105 08/17/2023   CHOLHDL 4.8 08/17/2023   Lab Results  Component Value Date   MICRALBCREAT 72 (H) 08/17/2023   MICRALBCREAT 89 (H) 07/08/2020   Lab Results  Component Value Date   CREATININE 1.33 (H) 01/07/2024   Lab Results  Component Value Date   GFR 66.60 10/23/2021    ASSESSMENT / PLAN  1. Uncontrolled type 1 diabetes mellitus with hyperglycemia (HCC)    Diabetes Mellitus type 1, complicated by diabetic retinopathy/CAD. - Diabetic status / severity: Uncontrolled.  Lab Results  Component Value Date   HGBA1C 7.7 (A) 03/06/2024    - Hemoglobin A1c goal : < 6.5%  Variable blood sugar with meals, advised patient to take NovoLog  before eating.  When he is not sure about size of the meal okay to take NovoLog  before eating and after eating with divided doses.  Discussed in detail.  - Medications: Adjusted diabetes regimen as follows.  Advised to stay on fixed dose of Novolin N and unless blood sugar is significantly low or significantly high.  -Take Novolin N 10 units with meals 2 times a day.  -Adjust NovoLog  5 to 15 units with meals 3 times a day.  Advised to take mostly 5 to 10 units for the regular meal.  Unless meal has significant amount of carbohydrates okay to take up to 15 units of NovoLog .  Discussed as above.  NovoLog  can be adjusted based on meal size and blood sugar, discussed in detail.  - Home glucose testing: Dexcom G7 and check as needed.    - Discussed/ Gave Hypoglycemia treatment plan.  # Consult : not required at this time.   # Annual urine for microalbuminuria/ creatinine ratio, + microalbuminuria currently, continue ACE/ARB /lisinopril .   Last  Lab Results  Component Value Date   MICRALBCREAT 72 (H) 08/17/2023    # Foot check nightly.  # Patient has diabetic retinopathy, following with ophthalmology [place] specialist  regularly.   - Diet: Make healthy diabetic food choices - Life style / activity / exercise: Discussed.  2. Blood pressure  -  BP Readings from Last 1 Encounters:  03/06/24 (!) 154/80    - Control is not in target. Reports blood pressure at home in normal range 130/60s.  I rechecked blood pressure it was 150/78.  Advised patient to monitor blood pressure at home, if remains high asked to talk with primary care provider. - No change in current plans.    3. Lipid status / Hyperlipidemia - Last  Lab Results  Component Value Date   LDLCALC 142 (H) 08/17/2023   -Patient not on a statin.  Patient does not want to be on a statin therapy.  He has been following with cardiology and lipid clinic in the past as  well.  He has coronary artery disease status post CABG. - Rediscussed today for considering statin therapy he does not want to take it.  Discussed that it is important to keep LDL controlled due to having history of diabetes and coronary artery disease. -He mentioned he gets myalgia and weakness with statin. - Advised patient to take Ezetimibe, he wants to review about the medication.  Provided written instruction.   Diagnoses and all orders for this visit:  Uncontrolled type 1 diabetes mellitus with hyperglycemia (HCC) -     POCT glycosylated hemoglobin (Hb A1C)   DISPOSITION Follow up in clinic in 3 months suggested.     All questions answered and patient verbalized understanding of the plan.  Nyema Hachey, MD [facility] Endocrinology [facility] [address] [place], [address] [place], [place] [address] Phone # [phone]  At least part of this note was generated using voice recognition software. Inadvertent word errors may have occurred, which were not recognized during the proofreading process.

## 2024-03-06 NOTE — Patient Instructions (Addendum)
Ezetimibe Tablets What is this medication? EZETIMIBE (ez ET i mibe) treats high cholesterol. It works by reducing the amount of cholesterol absorbed from the food you eat. This decreases the amount of bad cholesterol (such as LDL) in your blood. Changes to diet and exercise are often combined with this medication. This medicine may be used for other purposes; ask your health care provider or pharmacist if you have questions. COMMON BRAND NAME(S): Zetia What should I tell my care team before I take this medication? They need to know if you have any of these conditions: Kidney disease Liver disease Muscle cramps, pain Muscle injury Thyroid disease An unusual or allergic reaction to ezetimibe, other medications, foods, dyes, or preservatives Pregnant or trying to get pregnant Breast-feeding How should I use this medication? Take this medication by mouth. Take it as directed on the prescription label at the same time every day. You can take it with or without food. If it upsets your stomach, take it with food. Keep taking it unless your care team tells you to stop. Take bile acid sequestrants at a different time of day than this medication. Take this medication 2 hours BEFORE or 4 hours AFTER bile acid sequestrants. Talk to your care team about the use of this medication in children. While it may be prescribed for children as young as 10 for selected conditions, precautions do apply. Overdosage: If you think you have taken too much of this medicine contact a poison control center or emergency room at once. NOTE: This medicine is only for you. Do not share this medicine with others. What if I miss a dose? If you miss a dose, take it as soon as you can. If it is almost time for your next dose, take only that dose. Do not take double or extra doses. What may interact with this medication? Do not take this medication with any of the following: Fenofibrate Gemfibrozil This medication may also  interact with the following: Antacids Cyclosporine Herbal medications like red yeast rice Other medications to lower cholesterol or triglycerides This list may not describe all possible interactions. Give your health care provider a list of all the medicines, herbs, non-prescription drugs, or dietary supplements you use. Also tell them if you smoke, drink alcohol, or use illegal drugs. Some items may interact with your medicine. What should I watch for while using this medication? Visit your care team for regular checks on your progress. Tell your care team if your symptoms do not start to get better or if they get worse. Your care team may tell you to stop taking this medication if you develop muscle problems. If your muscle problems do not go away after stopping this medication, contact your care team. Talk to your care team if you wish to become pregnant or think you might be pregnant. This medication can cause serious birth defects if taken during pregnancy. Talk to your care team before breastfeeding. Changes to your treatment plan may be needed. Taking this medication is only part of a total heart healthy program. Ask your care team if there are other changes you can make to improve your overall health. What side effects may I notice from receiving this medication? Side effects that you should report to your doctor or health care provider as soon as possible: Allergic reactions--skin rash, itching or hives, swelling of the face, lips, tongue, or throat Side effects that usually do not require medical attention (report to your doctor or health care provider if  they continue or are bothersome): Diarrhea Joint pain This list may not describe all possible side effects. Call your doctor for medical advice about side effects. You may report side effects to FDA at 1-800-FDA-1088. Where should I keep my medication? Keep out of the reach of children and pets. Store at room temperature between 15  and 30 degrees C (59 and 86 degrees F). Protect from moisture. Get rid of any unused medication after the expiration date. NOTE: This sheet is a summary. It may not cover all possible information. If you have questions about this medicine, talk to your doctor, pharmacist, or health care provider.  2024 [employer] Standard (2022-01-20 00:00:00)

## 2024-03-06 NOTE — Progress Notes (Unsigned)
 Pharmacy Quality Measure Review  This patient is appearing on the insurance-providing list for being at risk of failing the adherence measure for Statin Therapy for Patients with Cardiovascular Disease (SPC) and Statin Use in Persons with Diabetes (SUPD) medications this calendar year.   Dr Levora has discussed treatment of hyperlipidemia in past. Patient has declined statin therapy and also Repatha for hyperlipidemia. He previously took Praluent . He was also suggested Leqvio previously and he refused.   Reports muscle aches and hair loss with atorvastatin and rosuvastatin .   Lab Results  Component Value Date   CHOL 207 (H) 08/17/2023   HDL 43 08/17/2023   LDLCALC 142 (H) 08/17/2023   TRIG 105 08/17/2023   CHOLHDL 4.8 08/17/2023   Plan:  Will out reach Dr Mercie to review past statin intolerances and use exclusion code if applicable.  Will also out reach patient to see if he would reconsider trial of ezetimibe since cost should be low and also has low risk of side effects.   Madelin Ray, PharmD Clinical Pharmacist [facility]  Population Health [phone]

## 2024-03-07 ENCOUNTER — Ambulatory Visit: Payer: Self-pay | Admitting: Cardiology

## 2024-03-09 ENCOUNTER — Encounter: Payer: Self-pay | Admitting: Pharmacist

## 2024-03-10 ENCOUNTER — Ambulatory Visit: Payer: Medicare HMO

## 2024-03-10 DIAGNOSIS — I4891 Unspecified atrial fibrillation: Secondary | ICD-10-CM

## 2024-03-12 LAB — CUP PACEART REMOTE DEVICE CHECK
Battery Remaining Longevity: 109 mo
Battery Remaining Percentage: 89 %
Battery Voltage: 3.01 V
Brady Statistic AP VP Percent: 1 %
Brady Statistic AP VS Percent: 13 %
Brady Statistic AS VP Percent: 1 %
Brady Statistic AS VS Percent: 87 %
Brady Statistic RA Percent Paced: 13 %
Brady Statistic RV Percent Paced: 1 %
Date Time Interrogation Session: 20251114030040
Implantable Lead Connection Status: 753985
Implantable Lead Connection Status: 753985
Implantable Lead Implant Date: 20240207
Implantable Lead Implant Date: 20240207
Implantable Lead Location: 753859
Implantable Lead Location: 753860
Implantable Pulse Generator Implant Date: 20240207
Lead Channel Impedance Value: 410 Ohm
Lead Channel Impedance Value: 540 Ohm
Lead Channel Pacing Threshold Amplitude: 0.75 V
Lead Channel Pacing Threshold Amplitude: 0.75 V
Lead Channel Pacing Threshold Pulse Width: 0.5 ms
Lead Channel Pacing Threshold Pulse Width: 0.5 ms
Lead Channel Sensing Intrinsic Amplitude: 12 mV
Lead Channel Sensing Intrinsic Amplitude: 2.3 mV
Lead Channel Setting Pacing Amplitude: 2 V
Lead Channel Setting Pacing Amplitude: 2.5 V
Lead Channel Setting Pacing Pulse Width: 0.5 ms
Lead Channel Setting Sensing Sensitivity: 2 mV
Pulse Gen Model: 2272
Pulse Gen Serial Number: 8154174

## 2024-03-14 ENCOUNTER — Ambulatory Visit: Payer: Self-pay | Admitting: Cardiology

## 2024-03-14 NOTE — Progress Notes (Signed)
 Remote PPM Transmission

## 2024-04-11 DIAGNOSIS — H401131 Primary open-angle glaucoma, bilateral, mild stage: Secondary | ICD-10-CM | POA: Diagnosis not present

## 2024-04-11 DIAGNOSIS — Z794 Long term (current) use of insulin: Secondary | ICD-10-CM | POA: Diagnosis not present

## 2024-04-11 DIAGNOSIS — H35342 Macular cyst, hole, or pseudohole, left eye: Secondary | ICD-10-CM | POA: Diagnosis not present

## 2024-04-11 DIAGNOSIS — H35352 Cystoid macular degeneration, left eye: Secondary | ICD-10-CM | POA: Diagnosis not present

## 2024-04-11 DIAGNOSIS — E113512 Type 2 diabetes mellitus with proliferative diabetic retinopathy with macular edema, left eye: Secondary | ICD-10-CM | POA: Diagnosis not present

## 2024-04-11 DIAGNOSIS — H211X2 Other vascular disorders of iris and ciliary body, left eye: Secondary | ICD-10-CM | POA: Diagnosis not present

## 2024-04-11 DIAGNOSIS — H3412 Central retinal artery occlusion, left eye: Secondary | ICD-10-CM | POA: Diagnosis not present

## 2024-04-11 DIAGNOSIS — H35372 Puckering of macula, left eye: Secondary | ICD-10-CM | POA: Diagnosis not present

## 2024-04-11 DIAGNOSIS — E113511 Type 2 diabetes mellitus with proliferative diabetic retinopathy with macular edema, right eye: Secondary | ICD-10-CM | POA: Diagnosis not present

## 2024-05-10 ENCOUNTER — Other Ambulatory Visit: Payer: Self-pay | Admitting: Endocrinology

## 2024-05-10 DIAGNOSIS — E1069 Type 1 diabetes mellitus with other specified complication: Secondary | ICD-10-CM

## 2024-06-07 ENCOUNTER — Ambulatory Visit: Admitting: Endocrinology

## 2024-07-03 ENCOUNTER — Ambulatory Visit (HOSPITAL_COMMUNITY)

## 2024-07-17 ENCOUNTER — Other Ambulatory Visit

## 2024-07-20 ENCOUNTER — Ambulatory Visit: Admitting: Hematology and Oncology

## 2024-07-26 ENCOUNTER — Ambulatory Visit: Admitting: Family Medicine
